# Patient Record
Sex: Female | Born: 1945 | Race: White | Hispanic: No | Marital: Married | Smoking: Never smoker
Health system: Southern US, Community
[De-identification: ages and names within clinical notes are randomized; demographics above are authoritative.]

## PROBLEM LIST (undated history)

## (undated) DIAGNOSIS — N83202 Unspecified ovarian cyst, left side: Principal | ICD-10-CM

## (undated) DIAGNOSIS — E785 Hyperlipidemia, unspecified: Secondary | ICD-10-CM

## (undated) DIAGNOSIS — M797 Fibromyalgia: Secondary | ICD-10-CM

## (undated) DIAGNOSIS — Z9889 Other specified postprocedural states: Secondary | ICD-10-CM

## (undated) DIAGNOSIS — M353 Polymyalgia rheumatica: Secondary | ICD-10-CM

## (undated) DIAGNOSIS — E538 Deficiency of other specified B group vitamins: Secondary | ICD-10-CM

## (undated) DIAGNOSIS — Z972 Presence of dental prosthetic device (complete) (partial): Secondary | ICD-10-CM

## (undated) DIAGNOSIS — N83201 Unspecified ovarian cyst, right side: Principal | ICD-10-CM

## (undated) DIAGNOSIS — K589 Irritable bowel syndrome without diarrhea: Secondary | ICD-10-CM

## (undated) DIAGNOSIS — M19011 Primary osteoarthritis, right shoulder: Secondary | ICD-10-CM

## (undated) DIAGNOSIS — M1711 Unilateral primary osteoarthritis, right knee: Secondary | ICD-10-CM

## (undated) DIAGNOSIS — R112 Nausea with vomiting, unspecified: Secondary | ICD-10-CM

## (undated) DIAGNOSIS — E039 Hypothyroidism, unspecified: Secondary | ICD-10-CM

## (undated) DIAGNOSIS — M810 Age-related osteoporosis without current pathological fracture: Secondary | ICD-10-CM

## (undated) DIAGNOSIS — M7541 Impingement syndrome of right shoulder: Secondary | ICD-10-CM

## (undated) DIAGNOSIS — I10 Essential (primary) hypertension: Secondary | ICD-10-CM

## (undated) DIAGNOSIS — N900 Mild vulvar dysplasia: Secondary | ICD-10-CM

## (undated) DIAGNOSIS — M75101 Unspecified rotator cuff tear or rupture of right shoulder, not specified as traumatic: Secondary | ICD-10-CM

## (undated) HISTORY — DX: Essential (primary) hypertension: I10

## (undated) HISTORY — PX: FOOT SURGERY: SHX648

## (undated) HISTORY — DX: Hyperlipidemia, unspecified: E78.5

## (undated) HISTORY — DX: Irritable bowel syndrome, unspecified: K58.9

## (undated) HISTORY — DX: Unspecified ovarian cyst, right side: N83.201

## (undated) HISTORY — DX: Hypothyroidism, unspecified: E03.9

## (undated) HISTORY — DX: Fibromyalgia: M79.7

## (undated) HISTORY — PX: TOTAL VAGINAL HYSTERECTOMY: SHX2548

## (undated) HISTORY — PX: GANGLION CYST EXCISION: SHX1691

## (undated) HISTORY — PX: EYE SURGERY: SHX253

## (undated) HISTORY — PX: BACK SURGERY: SHX140

## (undated) HISTORY — DX: Age-related osteoporosis without current pathological fracture: M81.0

## (undated) HISTORY — PX: TUBAL LIGATION: SHX77

## (undated) HISTORY — DX: Unspecified ovarian cyst, left side: N83.202

## (undated) HISTORY — PX: OTHER SURGICAL HISTORY: SHX169

## (undated) HISTORY — PX: CATARACT EXTRACTION, BILATERAL: SHX1313

## (undated) HISTORY — DX: Mild vulvar dysplasia: N90.0

---

## 1998-01-31 ENCOUNTER — Ambulatory Visit (HOSPITAL_COMMUNITY): Admission: RE | Admit: 1998-01-31 | Discharge: 1998-01-31 | Payer: Self-pay | Admitting: Family Medicine

## 2000-07-18 ENCOUNTER — Ambulatory Visit (HOSPITAL_COMMUNITY): Admission: RE | Admit: 2000-07-18 | Discharge: 2000-07-18 | Payer: Self-pay

## 2000-08-03 ENCOUNTER — Encounter: Admission: RE | Admit: 2000-08-03 | Discharge: 2000-08-03 | Payer: Self-pay | Admitting: Orthopedic Surgery

## 2000-08-03 ENCOUNTER — Encounter: Payer: Self-pay | Admitting: Orthopedic Surgery

## 2000-11-02 ENCOUNTER — Other Ambulatory Visit: Admission: RE | Admit: 2000-11-02 | Discharge: 2000-11-02 | Payer: Self-pay | Admitting: Obstetrics & Gynecology

## 2001-01-27 ENCOUNTER — Ambulatory Visit (HOSPITAL_COMMUNITY): Admission: RE | Admit: 2001-01-27 | Discharge: 2001-01-27 | Payer: Self-pay

## 2001-09-15 ENCOUNTER — Ambulatory Visit (HOSPITAL_COMMUNITY): Admission: RE | Admit: 2001-09-15 | Discharge: 2001-09-15 | Payer: Self-pay | Admitting: Obstetrics & Gynecology

## 2001-09-15 ENCOUNTER — Encounter: Payer: Self-pay | Admitting: Obstetrics & Gynecology

## 2001-10-06 ENCOUNTER — Encounter: Payer: Self-pay | Admitting: *Deleted

## 2001-10-06 ENCOUNTER — Encounter: Admission: RE | Admit: 2001-10-06 | Discharge: 2001-10-06 | Payer: Self-pay | Admitting: *Deleted

## 2001-11-06 ENCOUNTER — Other Ambulatory Visit: Admission: RE | Admit: 2001-11-06 | Discharge: 2001-11-06 | Payer: Self-pay | Admitting: Obstetrics & Gynecology

## 2002-01-02 ENCOUNTER — Encounter (INDEPENDENT_AMBULATORY_CARE_PROVIDER_SITE_OTHER): Payer: Self-pay | Admitting: *Deleted

## 2002-01-02 ENCOUNTER — Ambulatory Visit (HOSPITAL_COMMUNITY): Admission: RE | Admit: 2002-01-02 | Discharge: 2002-01-02 | Payer: Self-pay | Admitting: *Deleted

## 2002-08-28 ENCOUNTER — Ambulatory Visit (HOSPITAL_COMMUNITY): Admission: RE | Admit: 2002-08-28 | Discharge: 2002-08-28 | Payer: Self-pay | Admitting: Orthopedic Surgery

## 2002-09-27 ENCOUNTER — Encounter: Admission: RE | Admit: 2002-09-27 | Discharge: 2002-09-27 | Payer: Self-pay | Admitting: Family Medicine

## 2002-09-27 ENCOUNTER — Encounter: Payer: Self-pay | Admitting: Family Medicine

## 2003-11-07 ENCOUNTER — Encounter: Admission: RE | Admit: 2003-11-07 | Discharge: 2003-11-07 | Payer: Self-pay | Admitting: Family Medicine

## 2004-12-08 ENCOUNTER — Encounter: Admission: RE | Admit: 2004-12-08 | Discharge: 2004-12-08 | Payer: Self-pay | Admitting: Family Medicine

## 2005-05-25 ENCOUNTER — Ambulatory Visit (HOSPITAL_COMMUNITY): Admission: RE | Admit: 2005-05-25 | Discharge: 2005-05-25 | Payer: Self-pay | Admitting: *Deleted

## 2005-05-25 ENCOUNTER — Encounter (INDEPENDENT_AMBULATORY_CARE_PROVIDER_SITE_OTHER): Payer: Self-pay | Admitting: *Deleted

## 2005-05-25 HISTORY — PX: ESOPHAGOGASTRODUODENOSCOPY ENDOSCOPY: SHX5814

## 2005-05-26 ENCOUNTER — Ambulatory Visit (HOSPITAL_COMMUNITY): Admission: RE | Admit: 2005-05-26 | Discharge: 2005-05-26 | Payer: Self-pay | Admitting: *Deleted

## 2005-08-10 ENCOUNTER — Encounter: Admission: RE | Admit: 2005-08-10 | Discharge: 2005-08-10 | Payer: Self-pay

## 2006-02-16 ENCOUNTER — Encounter: Admission: RE | Admit: 2006-02-16 | Discharge: 2006-02-16 | Payer: Self-pay | Admitting: Family Medicine

## 2006-03-18 LAB — HM DEXA SCAN: HM Dexa Scan: NORMAL

## 2006-07-05 ENCOUNTER — Ambulatory Visit (HOSPITAL_COMMUNITY): Admission: RE | Admit: 2006-07-05 | Discharge: 2006-07-06 | Payer: Self-pay | Admitting: Urology

## 2006-07-05 HISTORY — PX: CYSTOCELE REPAIR: SHX163

## 2006-07-05 HISTORY — PX: CYSTO: SHX6284

## 2006-07-05 HISTORY — PX: INCONTINENCE SURGERY: SHX676

## 2006-11-03 ENCOUNTER — Ambulatory Visit (HOSPITAL_COMMUNITY): Admission: RE | Admit: 2006-11-03 | Discharge: 2006-11-03 | Payer: Self-pay | Admitting: *Deleted

## 2007-03-16 ENCOUNTER — Encounter: Admission: RE | Admit: 2007-03-16 | Discharge: 2007-03-16 | Payer: Self-pay | Admitting: Obstetrics & Gynecology

## 2007-03-24 ENCOUNTER — Ambulatory Visit (HOSPITAL_COMMUNITY): Admission: RE | Admit: 2007-03-24 | Discharge: 2007-03-24 | Payer: Self-pay | Admitting: Anesthesiology

## 2007-11-10 ENCOUNTER — Encounter: Admission: RE | Admit: 2007-11-10 | Discharge: 2007-11-10 | Payer: Self-pay | Admitting: Anesthesiology

## 2007-11-18 ENCOUNTER — Encounter: Admission: RE | Admit: 2007-11-18 | Discharge: 2007-11-18 | Payer: Self-pay | Admitting: Anesthesiology

## 2008-01-26 ENCOUNTER — Ambulatory Visit (HOSPITAL_BASED_OUTPATIENT_CLINIC_OR_DEPARTMENT_OTHER): Admission: RE | Admit: 2008-01-26 | Discharge: 2008-01-26 | Payer: Self-pay | Admitting: Ophthalmology

## 2008-01-26 HISTORY — PX: STRABISMUS SURGERY: SHX218

## 2008-04-23 ENCOUNTER — Encounter: Admission: RE | Admit: 2008-04-23 | Discharge: 2008-04-23 | Payer: Self-pay | Admitting: Obstetrics & Gynecology

## 2008-08-07 ENCOUNTER — Encounter: Admission: RE | Admit: 2008-08-07 | Discharge: 2008-08-07 | Payer: Self-pay | Admitting: Neurosurgery

## 2008-09-11 ENCOUNTER — Emergency Department (HOSPITAL_COMMUNITY): Admission: EM | Admit: 2008-09-11 | Discharge: 2008-09-11 | Payer: Self-pay | Admitting: Emergency Medicine

## 2008-10-08 ENCOUNTER — Inpatient Hospital Stay (HOSPITAL_COMMUNITY): Admission: RE | Admit: 2008-10-08 | Discharge: 2008-10-10 | Payer: Self-pay | Admitting: Neurosurgery

## 2008-10-08 HISTORY — PX: ANTERIOR LAT LUMBAR FUSION: SHX1168

## 2009-01-03 ENCOUNTER — Encounter: Admission: RE | Admit: 2009-01-03 | Discharge: 2009-01-03 | Payer: Self-pay | Admitting: Neurosurgery

## 2009-04-18 ENCOUNTER — Encounter: Admission: RE | Admit: 2009-04-18 | Discharge: 2009-04-18 | Payer: Self-pay

## 2009-05-02 ENCOUNTER — Encounter: Admission: RE | Admit: 2009-05-02 | Discharge: 2009-05-02 | Payer: Self-pay | Admitting: Obstetrics & Gynecology

## 2009-11-11 HISTORY — PX: COLONOSCOPY W/ BIOPSIES: SHX1374

## 2010-02-10 ENCOUNTER — Emergency Department (HOSPITAL_COMMUNITY): Admission: EM | Admit: 2010-02-10 | Discharge: 2010-02-10 | Payer: Self-pay | Admitting: Emergency Medicine

## 2010-02-13 ENCOUNTER — Ambulatory Visit (HOSPITAL_COMMUNITY): Admission: RE | Admit: 2010-02-13 | Discharge: 2010-02-13 | Payer: Self-pay | Admitting: Emergency Medicine

## 2010-04-08 ENCOUNTER — Encounter: Admission: RE | Admit: 2010-04-08 | Discharge: 2010-04-08 | Payer: Self-pay | Admitting: Internal Medicine

## 2010-06-03 ENCOUNTER — Encounter: Admission: RE | Admit: 2010-06-03 | Discharge: 2010-06-03 | Payer: Self-pay | Admitting: Obstetrics & Gynecology

## 2010-10-04 ENCOUNTER — Encounter: Payer: Self-pay | Admitting: *Deleted

## 2010-10-08 ENCOUNTER — Encounter
Admission: RE | Admit: 2010-10-08 | Discharge: 2010-10-08 | Payer: Self-pay | Source: Home / Self Care | Attending: Neurosurgery | Admitting: Neurosurgery

## 2010-12-28 LAB — COMPREHENSIVE METABOLIC PANEL
ALT: 34 U/L (ref 0–35)
AST: 32 U/L (ref 0–37)
Albumin: 4.3 g/dL (ref 3.5–5.2)
Alkaline Phosphatase: 143 U/L — ABNORMAL HIGH (ref 39–117)
BUN: 19 mg/dL (ref 6–23)
CO2: 26 mEq/L (ref 19–32)
Calcium: 10 mg/dL (ref 8.4–10.5)
Chloride: 102 mEq/L (ref 96–112)
Creatinine, Ser: 0.73 mg/dL (ref 0.4–1.2)
GFR calc Af Amer: >60 mL/min (ref >60)
GFR calc non Af Amer: >60 mL/min (ref >60)
Glucose, Bld: 111 mg/dL — ABNORMAL HIGH (ref 70–99)
Potassium: 4.7 mEq/L (ref 3.5–5.1)
Sodium: 139 mEq/L (ref 135–145)
Total Bilirubin: 0.4 mg/dL (ref 0.3–1.2)
Total Protein: 8.3 g/dL (ref 6.0–8.3)

## 2010-12-28 LAB — CBC
HCT: 41.6 % (ref 36.0–46.0)
Hemoglobin: 13.6 g/dL (ref 12.0–15.0)
MCHC: 32.7 g/dL (ref 30.0–36.0)
MCV: 87 fL (ref 78.0–100.0)
Platelets: 351 K/uL (ref 150–400)
RBC: 4.78 MIL/uL (ref 3.87–5.11)
RDW: 14 % (ref 11.5–15.5)
WBC: 9 K/uL (ref 4.0–10.5)

## 2010-12-28 LAB — DIFFERENTIAL
Basophils Absolute: 0 10*3/uL (ref 0.0–0.1)
Basophils Relative: 1 % (ref 0–1)
Eosinophils Absolute: 0.1 10*3/uL (ref 0.0–0.7)
Eosinophils Relative: 1 % (ref 0–5)
Lymphocytes Relative: 35 % (ref 12–46)
Lymphs Abs: 3.2 10*3/uL (ref 0.7–4.0)
Monocytes Absolute: 0.4 10*3/uL (ref 0.1–1.0)
Monocytes Relative: 5 % (ref 3–12)
Neutro Abs: 5.3 10*3/uL (ref 1.7–7.7)
Neutrophils Relative %: 58 % (ref 43–77)

## 2010-12-28 LAB — URINALYSIS, ROUTINE W REFLEX MICROSCOPIC
Bilirubin Urine: NEGATIVE
Glucose, UA: NEGATIVE mg/dL
Hgb urine dipstick: NEGATIVE
Ketones, ur: NEGATIVE mg/dL
Nitrite: NEGATIVE
Protein, ur: NEGATIVE mg/dL
Specific Gravity, Urine: 1.018 (ref 1.005–1.030)
Urobilinogen, UA: 1 mg/dL (ref 0.0–1.0)
pH: 6.5 (ref 5.0–8.0)

## 2010-12-28 LAB — TYPE AND SCREEN
ABO/RH(D): O POS
Antibody Screen: NEGATIVE

## 2010-12-28 LAB — URINE MICROSCOPIC-ADD ON

## 2010-12-28 LAB — PROTIME-INR
INR: 1.1 (ref 0.00–1.49)
Prothrombin Time: 14.1 seconds (ref 11.6–15.2)

## 2010-12-28 LAB — APTT: aPTT: 38 seconds — ABNORMAL HIGH (ref 24–37)

## 2010-12-28 LAB — ABO/RH: ABO/RH(D): O POS

## 2011-01-26 NOTE — Op Note (Signed)
NAMEADAMARIZ, [name]            ACCOUNT NO.:  [id number]   MEDICAL RECORD NO.:  [id number]          PATIENT TYPE:  AMB   LOCATION:  DSC                          FACILITY:  [facility]   PHYSICIAN:  Pasty Spillers. Maple Hudson, M.D. DATE OF BIRTH:  05/25/1946   DATE OF PROCEDURE:  01/26/2008  DATE OF DISCHARGE:                               OPERATIVE REPORT   PREOPERATIVE DIAGNOSIS:  Incomitant right exotropia, history of previous  strabismus surgery x2.   POSTOPERATIVE DIAGNOSIS:  Incomitant right exotropia, history of  previous strabismus surgery x2.   PROCEDURE:  1. Exploration of right medial rectus muscle.  2. Right lateral rectus muscle recession, 8.0 mm, adjustable      technique.  3. Advancement of right medial rectus muscle, 7.0 mm (from 15-8 mm      posterior to the limbus).   SURGEON:  Pasty Spillers. Young, MD   ANESTHESIA:  General (laryngeal mask).   COMPLICATIONS:  None.   DESCRIPTION OF PROCEDURE:  After preoperative evaluation including  informed consent, the patient was taken to the operating room where she  was identified by me.  General anesthesia was induced without difficulty  after placement of appropriate monitors.  The patient was prepped and  draped in standard sterile fashion.  Lid speculum was placed in the  right eye.   Exaggerated forced traction testing showed significant resistance to  forced adduction of the right eye.  A traction suture of 6-0 silk was  placed at the superior and inferior limbus, this was used to draw the  eye temporally.  A limbal conjunctival peritomy of 2 o'clock hours  extent was made nasally in the right eye with Westcott scissors, with  relaxing incisions in the superonasal and inferonasal quadrants.  The  right medial rectus muscle was engaged on a series of muscle hooks and  cleared of its surrounding fascial attachments and scar tissue.  The  muscle was found inserted 15 mm posterior to the limbus.  There was  definite solid muscle  tissue beginning about 3 mm posterior to the  current insertion.  It was not entirely clear whether the tissue  anterior to that was empty capsule or intact muscle/tendon.  A 2-mm bite  was taken of the center of the muscle belly with a double-arm 6-0 Vicryl  suture and a knot was tied securely, approximately 3 mm posterior to the  current insertion, unequivocally in muscle.  The needle at each end of  the double-arm suture was passed from the center of the muscle belly to  the periphery, parallel 2 and 3 mm posterior to the current insertion.  The muscle was disinserted.   Attention was then directed temporally, where a limbal conjunctival  peritomy of 2 o'clock hours extent was made with Westcott scissors, with  relaxing incisions in the superotemporal and inferotemporal quadrants.  The right lateral rectus muscle was engaged on a series of muscle hooks  and carefully cleared of its surrounding fascial attachments and scar  tissue.  It was evident from the conjunctiva that there had been  previous surgery on this muscle, though the current insertion was  at  approximately 8 mm posterior to the limbus.  The tendon was secured with  a double-arm 6-0 Vicryl suture, the double-locking bite at each border  of muscle, 1 mm from the insertion.  The muscle was disinserted, after  which forced adduction became free.  Each pole suture was passed back  into the original muscle stump in crossed swords fashion.  The muscle  was drawn up to the level of the original insertion, and the 2 pole  sutures were tied together 10 cm above the sclera.  The 2 pole sutures  were then joined at a measured distance of 8 mm above sclera with a  needle driver.  A noose suture of 6-0 Vicryl was passed around the 2  sutures at this location, after which the muscle was allowed to hang  back 8 mm from the insertion, as the noose suture reached the sclera.  The superior corner of the conjunctival flap was secured at the  level of  the insertion with a 6-0 plain gut suture.  A loop of 6-0 plain gut was  preplaced joining the inferior corner of the conjunctival flap with the  conjunctiva just inferior to the inferior border of the current  insertion.  This plain gut was tied with a very large loop and left open  to facilitate suture adjustment.   Attention was then redirected nasally, where the medial rectus muscle  was reattached to sclera 8 mm posterior to the limbus (in effect  advancing at 7.5 mm), using direct scleral passes in crossed swords  fashion.  The suture ends were tied superiorly.  Conjunctiva was  reapposed to the limbus with multiple 6-0 plain gut sutures.  The  traction suture was removed from the superior and inferior limbus and  repositioned at the temporal limbus.  The pole, noose, traction, and  plain gut sutures were secured to the cheek with Steri-Strips.  TobraDex  ointment was placed in the eye.  A soft patch was placed over the eye.  The patient was taken to the recovery room in stable condition, having  suffered no intraoperative or immediate postoperative complications.      Pasty Spillers. Maple Hudson, M.D.  Electronically Signed     WOY/MEDQ  D:  01/26/2008  T:  01/27/2008  Job:  [id number]

## 2011-01-26 NOTE — H&P (Signed)
NAMEMARCELLINA, [name]            ACCOUNT NO.:  [id number]   MEDICAL RECORD NO.:  [id number]          PATIENT TYPE:  INP   LOCATION:  3108                         FACILITY:  [facility]   PHYSICIAN:  Payton Doughty, M.D.      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   BODY OF TEXT:  A very nice self-referred 65 year old right-handed white  lady who 11 years ago had 4-5 and 5-1 fusion done by Dr. Senaida Ores at  [facility].  This was done for spondylolysis of grade 5.  She has done well.  She has had some back pain, and she has developed pain into her legs.  It has been worsening.  It is worse when she lies down and it is worse  on the side that she lays on.  CT scan shows spondylosis at C3-4.  Diskography was performed at L3-4 and it was strongly and concordantly  positive, and she is now admitted for a fusion at that level.   MEDICAL HISTORY:  Unremarkable.   Gemfibrozil, Cymbalta, nortriptyline, hydrochlorothiazide, benazepril,  OxyContin, levothyroxine, and hydrocodone.   She is allergic to PENICILLIN.   SURGICAL HISTORY:  Operation to low back and hand.   SOCIAL HISTORY:  She does not smoke and does not drink.  She is retired  on disability.  She has been on disability for about 9 years.   FAMILY HISTORY:  Mother died at 39 of heart disease and blood pressure.  Father died at 47 with an aneurysmal and lung cancer.   REVIEW OF SYSTEMS:  Marked for arthritis, neck pain, difficult memory,  depression, and thyroid disease.   PHYSICAL EXAMINATION:  HEENT:  Normal limits.  NECK:  She has reasonable range of motion of neck.  CHEST:  Clear.  CARDIAC:  Regular rate and rhythm.  ABDOMEN:  Nontender with no hepatosplenomegaly.  EXTREMITIES:  No clubbing or cyanosis.  GENITOURINARY:  Deferred.  Peripheral pulses are good.  NEUROLOGIC:  She is awake, alert, and  oriented.  Cranial nerves are  intact.  Motor exam shows 5/5 strength throughout the upper and lower  extremities.  Sensory, dysesthesias described bilaterally in  nondermatomal fashion.  Reflexes are absent at the knees and ankles.   Imaging studies have been reviewed above, basically has positive  diskography at 3-4.   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   The plan is for anterolateral interbody fusion with XLIF system.  The  risks and benefits have been discussed with her, and she wished to  proceed.            ______________________________  Payton Doughty, M.D.     MWR/MEDQ  D:  10/08/2008  T:  10/09/2008  Job:  [id number]

## 2011-01-26 NOTE — Op Note (Signed)
[name], [name]            ACCOUNT NO.:  [id number]   MEDICAL RECORD NO.:  [id number]          PATIENT TYPE:  INP   LOCATION:  3108                         FACILITY:  [facility]   PHYSICIAN:  Payton Doughty, M.D.      DATE OF BIRTH:  1946/07/23   DATE OF PROCEDURE:  10/08/2008  DATE OF DISCHARGE:                               OPERATIVE REPORT   PREOPERATIVE DIAGNOSIS:  Spondylosis and adjacent segment disease at L3-  4.   POSTOPERATIVE DIAGNOSIS:  Spondylosis and adjacent segment disease at L3-  4.   OPERATIVE PROCEDURE:  L3-4 anterolateral interbody fusion and lateral  plate placement.   SURGEON:  Payton Doughty, MD, Neurosurgery.   ANESTHESIA:  General endotracheal.   PREPARATION:  Prepped and draped with alcohol wipe.   COMPLICATIONS:  None.   NURSE ASSISTANT:  Bedelia Person, MD   DOCTOR ASSISTANT:  Cristi Loron, MD   BODY OF TEXT:  This is a 65 year old girl who has had a fusion at L4-5  and L5-S1 and has developed transitional segment disease at L3-4, taken  to the operating room, smoothly anesthetized and intubated, placed in  the left side up, right side down lateral decubitus position.  The table  was flexed and the area of the left iliac crest exposed.  Following  shave, prep and drape in usual sterile fashion.  Two skin incisions were  made, one directly over the L3-4 interspace just above left iliac crest  and one about 2 fingerbreadths behind that.  Through the posterior  incision, the retroperitoneal space was accessed and dissection down  through the lateral incision allowed the lateral access to the  retroperitoneal space.  Dilators were advanced and docked over the L3-4  interspace.  Using electrical monitoring, the lumbar plexus was  carefully monitored.  Re-docking was required in more anterior position.  Good high values for stimulation were obtained.  Steinmann pin was  placed and retractor system placed over top of it.  Inspection with the  manual  stimulator was also carried out and found to be safe.  The  retractor was expanded and the  placed into the disk space.  The  diskectomy was carried out under fluoroscopic control.  An 18 x 10 x 15-  mm interbody device was placed after being packed with Osteocel.  Radiographic confirmation of good positioning was obtained.  Two screws  were then placed, one above and one below and across there was a plate  placed and molded into place.  Final films  showed good placement of the interbody device as well as lateral plate  and screws.  Incisions were closed with successive layers of 2-0 Vicryl,  3-0 Vicryl, and 3-0 nylon.  Betadine Telfa dressings were applied.  The  patient returned to recovery room in good condition.            ______________________________  Payton Doughty, M.D.     MWR/MEDQ  D:  10/08/2008  T:  10/09/2008  Job:  [id number]

## 2011-01-29 NOTE — Op Note (Signed)
[name], [name]            ACCOUNT NO.:  [id number]   MEDICAL RECORD NO.:  [id number]          PATIENT TYPE:  AMB   LOCATION:  ENDO                         FACILITY:  [facility]   PHYSICIAN:  Georgiana Spinner, M.D.    DATE OF BIRTH:  1946-08-01   DATE OF PROCEDURE:  DATE OF DISCHARGE:                                 OPERATIVE REPORT   Audio too short to transcribe (less than 5 seconds)           ______________________________  Georgiana Spinner, M.D.     GMO/MEDQ  D:  05/25/2005  T:  05/25/2005  Job:  [id number]

## 2011-01-29 NOTE — Procedures (Signed)
[place]. [facility]  Patient:    [name], [name] Visit Number: [id number] MRN: [id number]          Service Type: END Location: ENDO Attending Physician:  Sabino Gasser Dictated by:   Sabino Gasser, M.D. Admit Date:  01/02/2002                             Procedure Report  PROCEDURE:  Upper endoscopy.  INDICATION:  Vomiting.  ANESTHESIA:  Demerol 80 mg, Versed 8 mg.  DESCRIPTION OF PROCEDURE:  With patient mildly sedated in the left lateral decubitus position, the Olympus videoscopic endoscope inserted in the mouth, passed under direct vision through the esophagus, which appeared normal, into the stomach.  Fundus, body, antrum were viewed, and there was some linear erythema extending outward from the pylorus in a radial pattern, which was photographed and biopsied.  We entered into the duodenal bulb, second portion of the duodenum.  Both appeared normal.  From this point the endoscope was slowly withdrawn, taking circumferential views of the duodenal mucosa until the endoscope had been pulled back into the stomach, placed in retroflexion to view the stomach from below.  The endoscope was then straightened and withdrawn, taking circumferential views of the remaining gastric and esophageal mucosa.  The patients vital signs and pulse oximetry remained stable.  The patient tolerated the procedure well without apparent complications.  FINDINGS:  Erythema of the antrum, biopsied.  PLAN:  Await biopsy report.  The patient will call me for results and follow up with me as an outpatient.  Will also schedule gastric emptying study. Proceed to colonoscopy as planned. Dictated by:   Sabino Gasser, M.D. Attending Physician:  Sabino Gasser DD:  01/02/02 TD:  01/02/02 Job: [id number]

## 2011-01-29 NOTE — Procedures (Signed)
[place]. [facility]  Patient:    TRANICE, LADUKE Visit Number: [id number] MRN: [id number]          Service Type: END Location: ENDO Attending Physician:  Sabino Gasser Dictated by:   Sabino Gasser, M.D. Proc. Date: 01/02/02 Admit Date:  01/02/2002                             Procedure Report  PROCEDURE PERFORMED:  Colonoscopy.  ENDOSCOPIST:  Sabino Gasser, M.D.  INDICATIONS FOR PROCEDURE:  Colon cancer screening, hemoccult positivity.  ANESTHESIA:  Demerol 20, Versed 2 mg.  DESCRIPTION OF PROCEDURE:  With the patient mildly sedated in the left lateral decubitus position, the Olympus videoscopic colonoscope was inserted in the rectum and passed under direct vision into the cecum.  The cecum was identified by the ileocecal valve and appendiceal orifice, both of which were photographed.  From this point, the colonoscope was slowly withdrawn, taking circumferential views of the entire colonic mucosa, stopping only in the rectum, which appeared normal on direct and showed hemorrhoids on retroflex view.  The endoscope was straightened and withdrawn.  Patients vital signs and pulse oximeter remained stable.  The patient tolerated the procedure well and without apparent complications.  FINDINGS:  Internal hemorrhoids.  Otherwise unremarkable colonoscopic examination.  PLAN:  See endoscopy note. Dictated by:   Sabino Gasser, M.D. Attending Physician:  Sabino Gasser DD:  01/02/02 TD:  01/02/02 Job: [id number]

## 2011-01-29 NOTE — Op Note (Signed)
[name], [name]            ACCOUNT NO.:  [id number]   MEDICAL RECORD NO.:  [id number]          PATIENT TYPE:  AMB   LOCATION:  ENDO                         FACILITY:  [facility]   PHYSICIAN:  Georgiana Spinner, M.D.    DATE OF BIRTH:  28-Nov-1945   DATE OF PROCEDURE:  05/25/2005  DATE OF DISCHARGE:                                 OPERATIVE REPORT   PROCEDURE:  Upper endoscopy.   ENDOSCOPIST:  Georgiana Spinner, M.D.   INDICATIONS:  Hemoccult positivity.   ANESTHESIA:  Demerol 40, Versed 4 mg.   PROCEDURE:  With the patient mildly sedated in the left lateral decubitus  position, the Olympus videoscopic endoscope was inserted in the mouth and  passed under direct vision through the esophagus, which appeared normal,  into the stomach; fundus, body and antrum were seen.  The whole stomach  appeared to be mildly edematous and mildly erythematous.  Photographs and  biopsies were taken.  Duodenal bulb and second portion of duodenum appeared  normal.  From this point, the endoscope was slowly withdrawn, taking  circumferential views of duodenal mucosa, until the endoscope had been  pulled back into the stomach and placed in retroflexion to view the stomach  from below.  The endoscope was then straightened and withdrawn, taking  circumferential views of the remaining gastric and esophageal mucosa.  The  patient's vital signs and pulse oximetry remained stable.  The patient  tolerated procedure well without apparent complications.   FINDINGS:  Mild erythema and edema of the gastric lumen, await biopsy  report.  The patient will call me for results and follow up with me as an  outpatient.  Proceed to colonoscopy as planned.           ______________________________  Georgiana Spinner, M.D.     GMO/MEDQ  D:  05/25/2005  T:  05/25/2005  Job:  [id number]   cc:   Windle Guard, M.D.  [address]  [place], [place] [address]  Fax: [phone]   Laqueta Linden, M.D.  [address]  [place]  [place] [address]  Fax: [phone]

## 2011-01-29 NOTE — Op Note (Signed)
NAMELUTISHA, KNOCHE            ACCOUNT NO.:  [id number]   MEDICAL RECORD NO.:  [id number]          PATIENT TYPE:  AMB   LOCATION:  ENDO                         FACILITY:  [facility]   PHYSICIAN:  Georgiana Spinner, M.D.    DATE OF BIRTH:  1946-04-25   DATE OF PROCEDURE:  05/25/2005  DATE OF DISCHARGE:                                 OPERATIVE REPORT   PROCEDURE:  Colonoscopy.   INDICATIONS:  Hemoccult positivity.   ANESTHESIA:  Demerol 40 mg, Versed 4 mg.   PROCEDURE:  With the patient mildly sedated in the left lateral decubitus  position, the Olympus videoscopic colonoscope was inserted in the rectum and  passed under direct vision proved to possibly just proximal to the splenic  flexure.  Landmarks were obscured, however, because the prep was poor.  There were areas of solid stool and thick brown liquid stool that could not  be suctioned, and I just elected at that point to terminate the procedure  because I felt that certainly significant lesions could be missed and, too,  she was at increased risk for complications.  Therefore, I elected to stop  at that point and withdraw the colonoscope, taking circumferential views of  colonic mucosa as best I could from that point, stopping to photograph  examples of the poor prep and the rectum, which appeared normal on direct  and retroflexed view.  The endoscope was straightened and withdrawn.  The  patient's vital signs and pulse oximeter remained stable.  The patient  tolerated the procedure well without apparent complication.   FINDINGS:  Poor prep precludes adequate examination.   Will plan on doing barium enema and have the patient follow up with me.  See  endoscopy note for further details as well.           ______________________________  Georgiana Spinner, M.D.     GMO/MEDQ  D:  05/25/2005  T:  05/25/2005  Job:  [id number]   cc:   Windle Guard, M.D.  [address]  [place], [place] [address]  Fax: [phone]   Laqueta Linden, M.D.  [address]  [place]  [place] [address]  Fax: [phone]

## 2011-01-29 NOTE — Op Note (Signed)
[name], [name]            ACCOUNT NO.:  [id number]   MEDICAL RECORD NO.:  [id number]          PATIENT TYPE:  OIB   LOCATION:  1432                         FACILITY:  [facility]   PHYSICIAN:  Martina Sinner, MD DATE OF BIRTH:  June 26, 1946   DATE OF PROCEDURE:  07/05/2006  DATE OF DISCHARGE:                                 OPERATIVE REPORT   PREOPERATIVE DIAGNOSES:  1. Cystocele.  2. Stress incontinence.   POSTOPERATIVE DIAGNOSES:  1. Cystocele.  2. Stress incontinence.   OPERATION PERFORMED:  1. Sling cystourethropexy [facility]).  2. Cystocele repair.  3. Cystoscopy.   SURGEON:  Martina Sinner, M.D.   ASSISTANT:  Terie Purser, M.D.   INDICATIONS FOR THE SURGERY:  Ms. Levenhagen has mixed stress and  incontinence; both are mild.  Her primary symptom is stress incontinence.  She has an symptomatic, moderate-sized cystocele; but, I thought with her  age and activity it would be reasonable to repair at the time of her  surgery.   DESCRIPTION OF THE OPERATION:  Ms. Noack  was prepped and draped in the  usual fashion.  Extra care was taken to minimize her risks of compartment  syndrome, neuropathy and deep venous thrombosis.   On inspection of the vaginal vault she had a grade 2 cystocele with a cuff  well-supported.  The dimples at the vaginal cuff were identified.  I made a  T-shaped anterior vaginal wall incision by dissecting the pubocervical  fascia from the thickened vaginal mucosa bilaterally.  I did not go all the  way back to the white line since I did not feel it was necessary to take  down her lateral attachments.  I also dissected laterally towards  the  urethrovesical angle bilaterally where the sling would be placed.  I made  sure that the mucosa had pubocervical fascia in the area where we were going  to place the sling to reduce the risk of extrusion.  An epinephrine and  lidocaine mixture of approximately 30 mL was used for plane development and  mainly for hemostasis.   I did a two-layer running closure with 2-0 Vicryl on an __________  needle  in a typical anterior repair not encroaching upon the urethrovesical angle.  I actually was happy with the reduction of the cystocele; and, based upon  her anatomy and being asymptomatic I did not think I needed to go back in  the white line and placing a supporting __________ .   I then cystoscoped the patient with efflux of Indigo Carmine from both  ureteral orifices.  Then with the bladder emptied I passed down the two  Stamey needles down on top of and then behind the symphysis pubis staying on  the periosteum delivering them through the urethrovesical angle. Again,  there was efflux of Indigo Carmine from both ureteral orifices.  There was  no trocar within the bladder.  There was no indentation of the bladder with  removal of the trocar.   With the bladder emptied the sling was attached and brought up through the  retropubic space.  We cut below the blue dots  and irrigated the sheath.  I  had tensioned over a Kelly clamp using the wide part of the clamp with my  usual technique.  I wanted to make certain, if anything that the sling was a  little bit on the loose side.  After removal of the sheath I was happy with  the tension of the sling as well as its position in the mid urethra.  I  could descend the sling in the midline as well as lateral to the midline on  the left and right sides.   I trimmed approximately 5-6 mm from each vaginal edge.  The closed the  vaginal mucosa after irrigating with copious irrigation with antibiotics  with running 2-0 Vicryl with a CT-1 needle.  We made certain there was thick  pubocervical fascia over the sling.   The Foley catheter was draining blue urine at the end of the case.  __________  position was good.  On cutting the sling in the suprapubic area  I closed the abdominal incision with 3-0 Vicryl followed by Dermabond.   I was pleased with  her surgery and hopefully it will reach her treatment  goal.           ______________________________  Martina Sinner, MD  Electronically Signed     SAM/MEDQ  D:  07/05/2006  T:  07/05/2006  Job:  [id number]

## 2011-06-09 LAB — POCT HEMOGLOBIN-HEMACUE: Hemoglobin: 12.2

## 2011-06-11 ENCOUNTER — Other Ambulatory Visit: Payer: Self-pay | Admitting: Obstetrics & Gynecology

## 2011-06-11 DIAGNOSIS — Z1231 Encounter for screening mammogram for malignant neoplasm of breast: Secondary | ICD-10-CM

## 2011-06-28 ENCOUNTER — Ambulatory Visit
Admission: RE | Admit: 2011-06-28 | Discharge: 2011-06-28 | Disposition: A | Discharge status: Home / Self Care | Payer: Medicare Other | Source: Ambulatory Visit | Attending: Obstetrics & Gynecology | Admitting: Obstetrics & Gynecology

## 2011-06-28 DIAGNOSIS — Z1231 Encounter for screening mammogram for malignant neoplasm of breast: Secondary | ICD-10-CM

## 2012-02-01 ENCOUNTER — Encounter: Payer: Self-pay | Admitting: Cardiology

## 2012-02-11 ENCOUNTER — Ambulatory Visit (INDEPENDENT_AMBULATORY_CARE_PROVIDER_SITE_OTHER): Payer: Medicare Other | Admitting: Cardiology

## 2012-02-11 ENCOUNTER — Encounter: Payer: Self-pay | Admitting: Cardiology

## 2012-02-11 ENCOUNTER — Encounter: Payer: Medicare Other | Admitting: Nurse Practitioner

## 2012-02-11 VITALS — BP 129/85 | HR 106 | Ht 68.0 in | Wt 200.4 lb

## 2012-02-11 DIAGNOSIS — E785 Hyperlipidemia, unspecified: Secondary | ICD-10-CM

## 2012-02-11 DIAGNOSIS — E7800 Pure hypercholesterolemia, unspecified: Secondary | ICD-10-CM

## 2012-02-11 DIAGNOSIS — R079 Chest pain, unspecified: Secondary | ICD-10-CM

## 2012-02-11 DIAGNOSIS — I10 Essential (primary) hypertension: Secondary | ICD-10-CM | POA: Insufficient documentation

## 2012-02-11 DIAGNOSIS — R072 Precordial pain: Secondary | ICD-10-CM | POA: Insufficient documentation

## 2012-02-11 NOTE — Assessment & Plan Note (Signed)
The blood pressure is at target. No change in medications is indicated. We will continue with therapeutic lifestyle changes (TLC).  

## 2012-02-11 NOTE — Patient Instructions (Signed)
The current medical regimen is effective;  continue present plan and medications.  Your physician has requested that you have an exercise tolerance test. For further information please visit [email or web address] Please also follow instruction sheet, as given.  Please have fasting blood work the same day as your treadmill.

## 2012-02-11 NOTE — Assessment & Plan Note (Signed)
She does have risk factors. However, I think her symptoms are somewhat atypical. I will bring the patient back for a POET (Plain Old Exercise Test). This will allow me to screen for obstructive coronary disease, risk stratify and very importantly provide a prescription for exercise.

## 2012-02-11 NOTE — Procedures (Signed)
Exercise Treadmill Test  Pre-Exercise Testing Evaluation Rhythm: normal sinus  Rate: 90   PR:  .14 QRS:  .10  QT:  .37 QTc: .45     Test  Exercise Tolerance Test Ordering MD: Angelina Sheriff, MD  Interpreting MD: Angelina Sheriff, MD  Unique Test No: 1  Treadmill:  1  Indication for ETT: chest pain - rule out ischemia  Contraindication to ETT: No   Stress Modality: exercise - treadmill  Cardiac Imaging Performed: non   Protocol: standard Bruce - maximal  Max BP:  175/74  Max MPHR (bpm):  156 85% MPR (bpm):  132  MPHR obtained (bpm):  140 % MPHR obtained:  90  Reached 85% MPHR (min:sec):  2:40 Total Exercise Time (min-sec):  4:00  Workload in METS:  5.8 Borg Scale: 17  Reason ETT Terminated:  desired heart rate attained    ST Segment Analysis At Rest: normal ST segments - no evidence of significant ST depression With Exercise: no evidence of significant ST depression  Other Information Arrhythmia:  No Angina during ETT:  present (1) Quality of ETT:  non-diagnostic  ETT Interpretation:  borderline (indeterminate) with non-specific ST changes  Comments: The patient had an poor exercise tolerance.  There was chest pain at only three minutes.  There was an increased level of dyspnea.  There were no arrhythmias and a normal BP response.  There were no ischemic ST T wave changes but an abnormal heart rate recovery.   Recommendations: Based on the above I could not exclude obstructive CAD.  She will need Lexiscan Myoview.

## 2012-02-11 NOTE — Progress Notes (Signed)
HPI The patient presents for evaluation of chest discomfort. She has a history of hyperlipidemia and hypertension but no prior cardiac history. She reports that she's been getting chest discomfort for some weeks. She had a severe episode about 3 weeks ago. This happened while driving. His meds at home. They lasted for about 3 minutes. She may have had some radiation down her left arm. She didn't have any radiation to her neck. She said it was somewhat sharp.  It came and went spontaneously. She has had less severe episodes since then. She is very active and does exercises routinely.  With her exercises she cannot bring on the symptoms. She does not report shortness of breath, PND or orthopnea. He has no palpitations, presyncope or syncope. Of note she reports a stress test many years ago.  Allergies  Allergen Reactions  . Penicillins   . Sulfa Drugs Cross Reactors     swelling    Current Outpatient Prescriptions  Medication Sig Dispense Refill  . benazepril (LOTENSIN) 20 MG tablet Take 20 mg by mouth daily.      . diazepam (VALIUM) 5 MG tablet Take 5 mg by mouth every 6 (six) hours as needed.      . DULoxetine (CYMBALTA) 60 MG capsule Take 60 mg by mouth daily.      Marland Kitchen gemfibrozil (LOPID) 600 MG tablet Take 600 mg by mouth daily.      . hydrochlorothiazide (HYDRODIURIL) 50 MG tablet Take 50 mg by mouth daily. 1/2 tab daily       . levothyroxine (SYNTHROID, LEVOTHROID) 100 MCG tablet Take 100 mcg by mouth daily.      . nortriptyline (PAMELOR) 50 MG capsule Take 50 mg by mouth at bedtime.      Marland Kitchen oxymorphone (OPANA ER) 20 MG 12 hr tablet Take 20 mg by mouth every 12 (twelve) hours.        Past Medical History  Diagnosis Date  . Chest skin lesion   . Muscle spasm   . UTI (lower urinary tract infection)   . HTN (hypertension)   . Hyperlipidemia   . Hypothyroid     Past Surgical History  Procedure Date  . Vaginal hysterectomy   . Ganglion cyst excision   . Eye surgery     "Lazy  eye"  . Thumb surgery     Bilateral  . Lumbar back     x 2    Family History  Problem Relation Age of Onset  . Aortic aneurysm Father   . Coronary artery disease Mother 28    History   Social History  . Marital Status: Married    Spouse Name: N/A    Number of Children: 3  . Years of Education: N/A   Occupational History  .     Social History Main Topics  . Smoking status: Never Smoker   . Smokeless tobacco: Not on file  . Alcohol Use: Not on file  . Drug Use: Not on file  . Sexually Active: Not on file   Other Topics Concern  . Not on file   Social History Narrative   Lives at home with wife and granddaughter.      ROS:  As stated in the HPI and negative for all other systems.   PHYSICAL EXAM BP 129/85  Pulse 106  Ht 5\' 8"  (1.727 m)  Wt 200 lb 6.4 oz (90.901 kg)  BMI 30.47 kg/m2 GENERAL:  Well appearing HEENT:  Pupils equal round and reactive,  fundi not visualized, oral mucosa unremarkable NECK:  No jugular venous distention, waveform within normal limits, carotid upstroke brisk and symmetric, no bruits, no thyromegaly LYMPHATICS:  No cervical, inguinal adenopathy LUNGS:  Clear to auscultation bilaterally BACK:  No CVA tenderness CHEST:  Unremarkable HEART:  PMI not displaced or sustained,S1 and S2 within normal limits, no S3, no S4, no clicks, no rubs, no murmurs ABD:  Flat, positive bowel sounds normal in frequency in pitch, no bruits, no rebound, no guarding, no midline pulsatile mass, no hepatomegaly, no splenomegaly EXT:  2 plus pulses throughout, no edema, no cyanosis no clubbing SKIN:  No rashes no nodules NEURO:  Cranial nerves II through XII grossly intact, motor grossly intact throughout PSYCH:  Cognitively intact, oriented to person place and time   EKG:  Sinus rhythm, rate 106, axis within normal limits, intervals within normal limits, no acute ST-T wave changes.  ASSESSMENT AND PLAN

## 2012-02-11 NOTE — Assessment & Plan Note (Addendum)
I will repeat a lipid profile when she returns for her stress test.

## 2012-02-15 ENCOUNTER — Other Ambulatory Visit: Payer: Medicare Other

## 2012-02-16 ENCOUNTER — Ambulatory Visit (HOSPITAL_COMMUNITY): Payer: Medicare Other | Attending: Cardiovascular Disease | Admitting: Radiology

## 2012-02-16 ENCOUNTER — Other Ambulatory Visit (INDEPENDENT_AMBULATORY_CARE_PROVIDER_SITE_OTHER): Payer: Medicare Other

## 2012-02-16 VITALS — BP 110/86 | Ht 68.0 in | Wt 197.0 lb

## 2012-02-16 DIAGNOSIS — R079 Chest pain, unspecified: Secondary | ICD-10-CM

## 2012-02-16 DIAGNOSIS — Z8249 Family history of ischemic heart disease and other diseases of the circulatory system: Secondary | ICD-10-CM | POA: Insufficient documentation

## 2012-02-16 DIAGNOSIS — I10 Essential (primary) hypertension: Secondary | ICD-10-CM | POA: Insufficient documentation

## 2012-02-16 DIAGNOSIS — E7800 Pure hypercholesterolemia, unspecified: Secondary | ICD-10-CM

## 2012-02-16 DIAGNOSIS — R0602 Shortness of breath: Secondary | ICD-10-CM

## 2012-02-16 DIAGNOSIS — E785 Hyperlipidemia, unspecified: Secondary | ICD-10-CM | POA: Insufficient documentation

## 2012-02-16 DIAGNOSIS — R61 Generalized hyperhidrosis: Secondary | ICD-10-CM | POA: Insufficient documentation

## 2012-02-16 LAB — LIPID PANEL
Cholesterol: 170 mg/dL (ref 0–200)
HDL: 57.3 mg/dL (ref >39.00)
LDL Cholesterol: 98 mg/dL (ref 0–99)
Total CHOL/HDL Ratio: 3
Triglycerides: 72 mg/dL (ref 0.0–149.0)
VLDL: 14.4 mg/dL (ref 0.0–40.0)

## 2012-02-16 MED ORDER — TECHNETIUM TC 99M TETROFOSMIN IV KIT
33.0000 | PACK | Freq: Once | INTRAVENOUS | Status: AC | PRN
  Administered 2012-02-16: 33 via INTRAVENOUS

## 2012-02-16 MED ORDER — TECHNETIUM TC 99M TETROFOSMIN IV KIT
11.0000 | PACK | Freq: Once | INTRAVENOUS | Status: AC | PRN
  Administered 2012-02-16: 11 via INTRAVENOUS

## 2012-02-16 MED ORDER — REGADENOSON 0.4 MG/5ML IV SOLN
0.4000 mg | Freq: Once | INTRAVENOUS | Status: AC
  Administered 2012-02-16: 0.4 mg via INTRAVENOUS

## 2012-02-16 NOTE — Progress Notes (Signed)
[facility] SITE 3 NUCLEAR MED [address] [place] [address] [phone]  Cardiology Nuclear Med Study  [name] is a 66 y.o. female     MRN : [id number]     DOB: [birth year]  Procedure Date: 02/16/2012  Nuclear Med Background Indication for Stress Test:  Evaluation for Ischemia, and 02/11/12 abnormal GXT History:  02/11/12 GXT: Non Specific ST T changes Non DX poor exercise tolerence with cp/doe abn HR in recovery Cardiac Risk Factors: Family History - CAD, Hypertension and Lipids  Symptoms:  Chest Pain and Diaphoresis   Nuclear Pre-Procedure Caffeine/Decaff Intake:  None> 12 hrs NPO After: 8:00pm   Lungs:  clear O2 Sat: 98% on room air. IV 0.9% NS with Angio Cath:  22g  IV Site: R Antecubital x 1, tolerated well IV Started by:  Irean Hong, RN  Chest Size (in):  42 Cup Size: C  Height: 5\' 8"  (1.727 m)  Weight:  197 lb (89.359 kg)  BMI:  Body mass index is 29.95 kg/(m^2). Tech Comments:  n/a    Nuclear Med Study 1 or 2 day study: 1 day  Stress Test Type:  Treadmill/Lexiscan  Reading MD: Kristeen Miss, MD  Order Authorizing Provider:  Rollene Rotunda, MD  Resting Radionuclide: Technetium 71m Tetrofosmin  Resting Radionuclide Dose: 11.0 mCi   Stress Radionuclide:  Technetium 10m Tetrofosmin  Stress Radionuclide Dose: 33.0 mCi           Stress Protocol Rest HR: 71 Stress HR: 100  Rest BP: 110/86 Stress BP: 120/71  Exercise Time (min): n/a METS: n/a   Predicted Max HR: 156 bpm % Max HR: 64.1 bpm Rate Pressure Product: 60109   Dose of Adenosine (mg):  n/a Dose of Lexiscan: 0.4 mg  Dose of Atropine (mg): n/a Dose of Dobutamine: n/a mcg/kg/min (at max HR)  Stress Test Technologist: Milana Na, EMT-P  Nuclear Technologist:  Domenic Polite, CNMT     Rest Procedure:  Myocardial perfusion imaging was performed at rest 45 minutes following the intravenous administration of Technetium 12m Tetrofosmin. Rest ECG: NSR - Normal EKG  Stress  Procedure:  The patient received IV Lexiscan 0.4 mg over 15-seconds with concurrent low level exercise and then Technetium 71m Tetrofosmin was injected at 30-seconds while the patient continued walking one more minute. There were no significant changes, + sob, chest heaviness, and headache with Lexiscan. Quantitative spect images were obtained after a 45-minute delay. Stress ECG: No significant change from baseline ECG  QPS Raw Data Images:  Normal; no motion artifact; normal heart/lung ratio. Stress Images:  Normal homogeneous uptake in all areas of the myocardium. Rest Images:  Normal homogeneous uptake in all areas of the myocardium. Subtraction (SDS):  No evidence of ischemia. Transient Ischemic Dilatation (Normal <1.22):  0.97 Lung/Heart Ratio (Normal <0.45):  0.30  Quantitative Gated Spect Images QGS EDV:  80 ml QGS ESV:  24 ml  Impression Exercise Capacity:  Lexiscan with no exercise. BP Response:  Normal blood pressure response. Clinical Symptoms:  No significant symptoms noted. ECG Impression:  No significant ST segment change suggestive of ischemia. Comparison with Prior Nuclear Study: No images to compare  Overall Impression:  Normal stress nuclear study.  No evidence of ischemia.  Normal LV function.  LV Ejection Fraction: 70%.  LV Wall Motion:  NL LV Function; NL Wall Motion.    [name], [name], MD, [facility] 02/16/2012, 4:57 PM Office - [phone] Pager [phone]

## 2012-02-18 ENCOUNTER — Encounter: Payer: Self-pay | Admitting: *Deleted

## 2012-02-22 ENCOUNTER — Telehealth: Payer: Self-pay | Admitting: *Deleted

## 2012-02-22 NOTE — Telephone Encounter (Signed)
Pt aware to follow up as needed 

## 2012-02-22 NOTE — Telephone Encounter (Signed)
Message copied by Sharin Grave on Tue Feb 22, 2012  4:37 PM ------      Message from: Rollene Rotunda      Created: Sun Feb 20, 2012 10:03 AM      Regarding: RE: ? follow up appt       She can come back to see me if she has any further questions.  However, I would suggest follow up with her primary care since I don't suspect a cardiac etiology to her complaints.       ----- Message -----         From: Rocco Serene, RN         Sent: 02/18/2012   4:04 PM           To: Rollene Rotunda, MD      Subject: ? follow up appt                                         Pt wants to know when she should follow up

## 2012-03-01 ENCOUNTER — Encounter: Payer: Self-pay | Admitting: Cardiology

## 2012-03-03 ENCOUNTER — Institutional Professional Consult (permissible substitution): Payer: Medicare Other | Admitting: Cardiology

## 2012-07-13 ENCOUNTER — Other Ambulatory Visit: Payer: Self-pay | Admitting: Obstetrics & Gynecology

## 2012-07-13 DIAGNOSIS — Z1231 Encounter for screening mammogram for malignant neoplasm of breast: Secondary | ICD-10-CM

## 2012-08-23 ENCOUNTER — Ambulatory Visit
Admission: RE | Admit: 2012-08-23 | Discharge: 2012-08-23 | Disposition: A | Discharge status: Home / Self Care | Payer: Medicare Other | Source: Ambulatory Visit | Attending: Obstetrics & Gynecology | Admitting: Obstetrics & Gynecology

## 2012-08-23 DIAGNOSIS — Z1231 Encounter for screening mammogram for malignant neoplasm of breast: Secondary | ICD-10-CM

## 2012-09-13 HISTORY — PX: ORIF FOREARM FRACTURE: SHX2124

## 2012-11-11 HISTORY — PX: COLONOSCOPY W/ BIOPSIES: SHX1374

## 2013-01-25 ENCOUNTER — Other Ambulatory Visit: Payer: Self-pay | Admitting: Gastroenterology

## 2013-01-25 LAB — HM COLONOSCOPY

## 2013-07-14 DIAGNOSIS — M75101 Unspecified rotator cuff tear or rupture of right shoulder, not specified as traumatic: Secondary | ICD-10-CM

## 2013-07-14 DIAGNOSIS — M19011 Primary osteoarthritis, right shoulder: Secondary | ICD-10-CM

## 2013-07-14 DIAGNOSIS — M7541 Impingement syndrome of right shoulder: Secondary | ICD-10-CM

## 2013-07-14 HISTORY — DX: Primary osteoarthritis, right shoulder: M19.011

## 2013-07-14 HISTORY — DX: Impingement syndrome of right shoulder: M75.41

## 2013-07-14 HISTORY — DX: Unspecified rotator cuff tear or rupture of right shoulder, not specified as traumatic: M75.101

## 2013-07-30 ENCOUNTER — Other Ambulatory Visit: Payer: Self-pay | Admitting: Orthopedic Surgery

## 2013-07-30 ENCOUNTER — Encounter (HOSPITAL_BASED_OUTPATIENT_CLINIC_OR_DEPARTMENT_OTHER): Payer: Self-pay | Admitting: *Deleted

## 2013-07-30 NOTE — Pre-Procedure Instructions (Signed)
To come for BMET and EKG 

## 2013-08-01 ENCOUNTER — Other Ambulatory Visit: Payer: Self-pay

## 2013-08-01 ENCOUNTER — Encounter (HOSPITAL_BASED_OUTPATIENT_CLINIC_OR_DEPARTMENT_OTHER)
Admission: RE | Admit: 2013-08-01 | Discharge: 2013-08-01 | Disposition: A | Discharge status: Home / Self Care | Payer: Medicare Other | Source: Ambulatory Visit | Attending: Orthopedic Surgery | Admitting: Orthopedic Surgery

## 2013-08-01 LAB — BASIC METABOLIC PANEL
BUN: 17 mg/dL (ref 6–23)
CO2: 25 mEq/L (ref 19–32)
Calcium: 9.6 mg/dL (ref 8.4–10.5)
Chloride: 104 mEq/L (ref 96–112)
Creatinine, Ser: 0.66 mg/dL (ref 0.50–1.10)
GFR calc Af Amer: >90 mL/min (ref >90)
GFR calc non Af Amer: >90 mL/min (ref >90)
Glucose, Bld: 121 mg/dL — ABNORMAL HIGH (ref 70–99)
Potassium: 4.1 mEq/L (ref 3.5–5.1)
Sodium: 142 mEq/L (ref 135–145)

## 2013-08-01 NOTE — H&P (Signed)
  [facility] [address]   [address] [place], [place] [address] [phone] A Division of [facility]  [name], M.D.   Robert A. Thurston Hole, M.D.   Burnell Blanks, M.D.   Eulas Post, M.D.   Lunette Stands, M.D Jewel Baize. Eulah Pont, M.D.  Buford Dresser, M.D.  Estell Harpin, M.D.    Melina Fiddler, M.D. Danford Bad. Willa Rough, PA-C  Kirstin A. Shepperson, PA-C  Josh Louisville, PA-C [place], [place]   RE: Isabel, Morgan   [id number]      DOB: 03-26-1946 PROGRESS NOTE: 07-20-13 [name] comes in with right shoulder pain that started 10 months ago. She states she was reaching behind her back and felt a pop. Pain has persisted since then. She has seen her primary care physician Dr. Jeannetta Nap and had 2 cortisone injections and 1 round of Prednisone without relief. She rates pain at 9/10 described as dull. She states the  pain wakes her at night and she is unable to completely move the arm. She has significant weakness with the right shoulder. She denies any numbness or tingling. No other complaints.  Past medical, family, social history is detailed on the chart. Review of systems detailed in HPI all others are reviewed and are negative.   EXAMINATION: Well-developed well-nourished female in no acute distress. Alert and oriented x3. Exam of her right shoulder shows some rotator cuff atrophy. She has full passive range of motion. With active range of motion she has abduction to approximately 20 degrees forward flexion to 20 degrees. Positive empty can test positive drop arm test. She is neurovascularly intact distally.  IMPRESSION: Right shoulder rotator cuff tear.   PLAN: Patient was educated on their diagnosis and typical treatment algorithm.  Based on exam we are very concerned for a near complete rotator cuff tear. We will order an MRI to assess the degree of rotator cuff pathology. Given the likelihood we will find  a rotator cuff tear surgical options are discussed today. Discussed risks benefits and possible complications and she would like to schedule surgery. We will call her with the MRI results and continued surgical planning based on MRI findings. Instructed patient to call with questions and/or concerns. Questions were encouraged and answered. She will follow-up one week post-op.  [name], M.D.  Electronically verified by [name], M.D. DFM([facility]):kah D 07-20-13 T 07-20-13

## 2013-08-02 ENCOUNTER — Encounter (HOSPITAL_BASED_OUTPATIENT_CLINIC_OR_DEPARTMENT_OTHER): Payer: Self-pay | Admitting: Anesthesiology

## 2013-08-02 ENCOUNTER — Encounter (HOSPITAL_BASED_OUTPATIENT_CLINIC_OR_DEPARTMENT_OTHER)
Admission: RE | Disposition: A | Discharge status: Home / Self Care | Payer: Self-pay | Source: Ambulatory Visit | Attending: Orthopedic Surgery

## 2013-08-02 ENCOUNTER — Encounter (HOSPITAL_BASED_OUTPATIENT_CLINIC_OR_DEPARTMENT_OTHER): Payer: Medicare Other | Admitting: Anesthesiology

## 2013-08-02 ENCOUNTER — Ambulatory Visit (HOSPITAL_BASED_OUTPATIENT_CLINIC_OR_DEPARTMENT_OTHER)
Admission: RE | Admit: 2013-08-02 | Discharge: 2013-08-02 | Disposition: A | Discharge status: Home / Self Care | Payer: Medicare Other | Source: Ambulatory Visit | Attending: Orthopedic Surgery | Admitting: Orthopedic Surgery

## 2013-08-02 ENCOUNTER — Ambulatory Visit (HOSPITAL_BASED_OUTPATIENT_CLINIC_OR_DEPARTMENT_OTHER): Payer: Medicare Other | Admitting: Anesthesiology

## 2013-08-02 DIAGNOSIS — Z01812 Encounter for preprocedural laboratory examination: Secondary | ICD-10-CM | POA: Insufficient documentation

## 2013-08-02 DIAGNOSIS — M719 Bursopathy, unspecified: Secondary | ICD-10-CM | POA: Insufficient documentation

## 2013-08-02 DIAGNOSIS — M19019 Primary osteoarthritis, unspecified shoulder: Secondary | ICD-10-CM | POA: Insufficient documentation

## 2013-08-02 DIAGNOSIS — M25819 Other specified joint disorders, unspecified shoulder: Secondary | ICD-10-CM | POA: Insufficient documentation

## 2013-08-02 DIAGNOSIS — M67919 Unspecified disorder of synovium and tendon, unspecified shoulder: Secondary | ICD-10-CM | POA: Insufficient documentation

## 2013-08-02 DIAGNOSIS — M19011 Primary osteoarthritis, right shoulder: Secondary | ICD-10-CM

## 2013-08-02 HISTORY — DX: Deficiency of other specified B group vitamins: E53.8

## 2013-08-02 HISTORY — DX: Other specified postprocedural states: Z98.890

## 2013-08-02 HISTORY — DX: Presence of dental prosthetic device (complete) (partial): Z97.2

## 2013-08-02 HISTORY — DX: Primary osteoarthritis, right shoulder: M19.011

## 2013-08-02 HISTORY — DX: Unspecified rotator cuff tear or rupture of right shoulder, not specified as traumatic: M75.101

## 2013-08-02 HISTORY — PX: SHOULDER ARTHROSCOPY WITH ROTATOR CUFF REPAIR AND SUBACROMIAL DECOMPRESSION: SHX5686

## 2013-08-02 HISTORY — DX: Impingement syndrome of right shoulder: M75.41

## 2013-08-02 HISTORY — DX: Other specified postprocedural states: R11.2

## 2013-08-02 LAB — POCT HEMOGLOBIN-HEMACUE: Hemoglobin: 13.5 g/dL (ref 12.0–15.0)

## 2013-08-02 SURGERY — SHOULDER ARTHROSCOPY WITH ROTATOR CUFF REPAIR AND SUBACROMIAL DECOMPRESSION
Anesthesia: Regional | Site: Shoulder | Laterality: Right | Wound class: Clean

## 2013-08-02 MED ORDER — SODIUM CHLORIDE 0.9 % IR SOLN
Status: DC | PRN
  Administered 2013-08-02: 13000 mL

## 2013-08-02 MED ORDER — MIDAZOLAM HCL 2 MG/ML PO SYRP: 12.0000 mg | ORAL_SOLUTION | Freq: Once | ORAL | Status: DC | PRN

## 2013-08-02 MED ORDER — OXYCODONE-ACETAMINOPHEN 7.5-325 MG PO TABS: 1.0000 | ORAL_TABLET | ORAL | Status: DC | PRN

## 2013-08-02 MED ORDER — DEXAMETHASONE SODIUM PHOSPHATE 4 MG/ML IJ SOLN
INTRAMUSCULAR | Status: DC | PRN
  Administered 2013-08-02: 10 mg via INTRAVENOUS

## 2013-08-02 MED ORDER — FENTANYL CITRATE 0.05 MG/ML IJ SOLN
INTRAMUSCULAR | Status: AC
  Filled 2013-08-02: qty 2

## 2013-08-02 MED ORDER — ONDANSETRON HCL 4 MG/2ML IJ SOLN
INTRAMUSCULAR | Status: DC | PRN
  Administered 2013-08-02: 4 mg via INTRAVENOUS

## 2013-08-02 MED ORDER — CHLORHEXIDINE GLUCONATE 4 % EX LIQD: 60.0000 mL | Freq: Once | CUTANEOUS | Status: DC

## 2013-08-02 MED ORDER — MIDAZOLAM HCL 2 MG/2ML IJ SOLN
INTRAMUSCULAR | Status: AC
  Filled 2013-08-02: qty 2

## 2013-08-02 MED ORDER — FENTANYL CITRATE 0.05 MG/ML IJ SOLN
50.0000 ug | INTRAMUSCULAR | Status: DC | PRN
  Administered 2013-08-02: 50 ug via INTRAVENOUS

## 2013-08-02 MED ORDER — LIDOCAINE HCL (CARDIAC) 20 MG/ML IV SOLN
INTRAVENOUS | Status: DC | PRN
  Administered 2013-08-02: 50 mg via INTRAVENOUS

## 2013-08-02 MED ORDER — LACTATED RINGERS IV SOLN
INTRAVENOUS | Status: DC
  Administered 2013-08-02: 08:00:00 via INTRAVENOUS

## 2013-08-02 MED ORDER — PROPOFOL 10 MG/ML IV BOLUS
INTRAVENOUS | Status: DC | PRN
  Administered 2013-08-02: 150 mg via INTRAVENOUS

## 2013-08-02 MED ORDER — MIDAZOLAM HCL 2 MG/2ML IJ SOLN
1.0000 mg | INTRAMUSCULAR | Status: DC | PRN
  Administered 2013-08-02: 1 mg via INTRAVENOUS

## 2013-08-02 MED ORDER — SODIUM CHLORIDE 0.9 % IV SOLN: INTRAVENOUS | Status: DC

## 2013-08-02 MED ORDER — OXYCODONE HCL 5 MG/5ML PO SOLN: 5.0000 mg | Freq: Once | ORAL | Status: DC | PRN

## 2013-08-02 MED ORDER — CLINDAMYCIN PHOSPHATE 900 MG/50ML IV SOLN
INTRAVENOUS | Status: AC
  Filled 2013-08-02: qty 50

## 2013-08-02 MED ORDER — EPHEDRINE SULFATE 50 MG/ML IJ SOLN
INTRAMUSCULAR | Status: DC | PRN
  Administered 2013-08-02 (×4): 10 mg via INTRAVENOUS

## 2013-08-02 MED ORDER — SUCCINYLCHOLINE CHLORIDE 20 MG/ML IJ SOLN
INTRAMUSCULAR | Status: DC | PRN
  Administered 2013-08-02: 100 mg via INTRAVENOUS

## 2013-08-02 MED ORDER — HYDROMORPHONE HCL PF 1 MG/ML IJ SOLN: 0.2500 mg | INTRAMUSCULAR | Status: DC | PRN

## 2013-08-02 MED ORDER — BUPIVACAINE-EPINEPHRINE PF 0.5-1:200000 % IJ SOLN
INTRAMUSCULAR | Status: DC | PRN
  Administered 2013-08-02: 30 mL

## 2013-08-02 MED ORDER — OXYCODONE HCL 5 MG PO TABS: 5.0000 mg | ORAL_TABLET | Freq: Once | ORAL | Status: DC | PRN

## 2013-08-02 MED ORDER — CLINDAMYCIN PHOSPHATE 900 MG/50ML IV SOLN
900.0000 mg | INTRAVENOUS | Status: AC
  Administered 2013-08-02: 900 mg via INTRAVENOUS

## 2013-08-02 MED ORDER — ONDANSETRON HCL 4 MG/2ML IJ SOLN: 4.0000 mg | Freq: Four times a day (QID) | INTRAMUSCULAR | Status: DC | PRN

## 2013-08-02 SURGICAL SUPPLY — 74 items
ANCHOR PEEK SWIVEL LOCK 5.5 (Anchor) ×4 IMPLANT
BENZOIN TINCTURE PRP APPL 2/3 (GAUZE/BANDAGES/DRESSINGS) IMPLANT
BLADE CUTTER GATOR 3.5 (BLADE) ×2 IMPLANT
BLADE CUTTER MENIS 5.5 (BLADE) IMPLANT
BLADE GREAT WHITE 4.2 (BLADE) ×2 IMPLANT
BLADE SURG 15 STRL LF DISP TIS (BLADE) IMPLANT
BLADE SURG 15 STRL SS (BLADE)
BUR OVAL 6.0 (BURR) ×2 IMPLANT
CANISTER SUCT 3000ML (MISCELLANEOUS) IMPLANT
CANISTER SUCT LVC 12 LTR MEDI- (MISCELLANEOUS) IMPLANT
CANNULA DRY DOC 8X75 (CANNULA) ×2 IMPLANT
CANNULA TWIST IN 8.25X7CM (CANNULA) IMPLANT
DECANTER SPIKE VIAL GLASS SM (MISCELLANEOUS) IMPLANT
DRAPE STERI 35X30 U-POUCH (DRAPES) ×2 IMPLANT
DRAPE U-SHAPE 47X51 STRL (DRAPES) ×2 IMPLANT
DRAPE U-SHAPE 76X120 STRL (DRAPES) ×4 IMPLANT
DRSG PAD ABDOMINAL 8X10 ST (GAUZE/BANDAGES/DRESSINGS) ×2 IMPLANT
DURAPREP 26ML APPLICATOR (WOUND CARE) ×2 IMPLANT
ELECT MENISCUS 165MM 90D (ELECTRODE) ×2 IMPLANT
ELECT NEEDLE TIP 2.8 STRL (NEEDLE) IMPLANT
ELECT REM PT RETURN 9FT ADLT (ELECTROSURGICAL) ×2
ELECTRODE REM PT RTRN 9FT ADLT (ELECTROSURGICAL) ×1 IMPLANT
GAUZE XEROFORM 1X8 LF (GAUZE/BANDAGES/DRESSINGS) ×2 IMPLANT
GLOVE BIO SURGEON STRL SZ 6.5 (GLOVE) ×2 IMPLANT
GLOVE BIO SURGEON STRL SZ7 (GLOVE) ×4 IMPLANT
GLOVE BIO SURGEON STRL SZ8 (GLOVE) IMPLANT
GLOVE BIOGEL PI IND STRL 7.0 (GLOVE) ×1 IMPLANT
GLOVE BIOGEL PI IND STRL 7.5 (GLOVE) ×2 IMPLANT
GLOVE BIOGEL PI IND STRL 8.5 (GLOVE) IMPLANT
GLOVE BIOGEL PI INDICATOR 7.0 (GLOVE) ×1
GLOVE BIOGEL PI INDICATOR 7.5 (GLOVE) ×2
GLOVE BIOGEL PI INDICATOR 8.5 (GLOVE)
GLOVE EXAM NITRILE MD LF STRL (GLOVE) ×2 IMPLANT
GLOVE ORTHO TXT STRL SZ7.5 (GLOVE) ×2 IMPLANT
GOWN PREVENTION PLUS XLARGE (GOWN DISPOSABLE) ×4 IMPLANT
GOWN PREVENTION PLUS XXLARGE (GOWN DISPOSABLE) ×4 IMPLANT
GOWN STRL REIN 2XL XLG LVL4 (GOWN DISPOSABLE) IMPLANT
IV NS IRRIG 3000ML ARTHROMATIC (IV SOLUTION) ×8 IMPLANT
NDL SUT 6 .5 CRC .975X.05 MAYO (NEEDLE) IMPLANT
NEEDLE MAYO TAPER (NEEDLE)
NEEDLE SCORPION MULTI FIRE (NEEDLE) ×2 IMPLANT
NS IRRIG 1000ML POUR BTL (IV SOLUTION) IMPLANT
PACK ARTHROSCOPY DSU (CUSTOM PROCEDURE TRAY) ×2 IMPLANT
PACK BASIN DAY SURGERY FS (CUSTOM PROCEDURE TRAY) ×2 IMPLANT
PASSER SUT SWANSON 36MM LOOP (INSTRUMENTS) IMPLANT
PENCIL BUTTON HOLSTER BLD 10FT (ELECTRODE) ×2 IMPLANT
SET ARTHROSCOPY TUBING (MISCELLANEOUS) ×1
SET ARTHROSCOPY TUBING LN (MISCELLANEOUS) ×1 IMPLANT
SLEEVE SCD COMPRESS KNEE MED (MISCELLANEOUS) ×2 IMPLANT
SLING ARM FOAM STRAP LRG (SOFTGOODS) IMPLANT
SLING ARM FOAM STRAP MED (SOFTGOODS) IMPLANT
SLING ARM FOAM STRAP XLG (SOFTGOODS) IMPLANT
SLING ARM IMMOBILIZER LRG (SOFTGOODS) IMPLANT
SLING ARM IMMOBILIZER MED (SOFTGOODS) IMPLANT
SPONGE GAUZE 4X4 12PLY (GAUZE/BANDAGES/DRESSINGS) ×4 IMPLANT
SPONGE LAP 4X18 X RAY DECT (DISPOSABLE) IMPLANT
STRIP CLOSURE SKIN 1/2X4 (GAUZE/BANDAGES/DRESSINGS) IMPLANT
SUCTION FRAZIER TIP 10 FR DISP (SUCTIONS) IMPLANT
SUT ETHIBOND 2 OS 4 DA (SUTURE) IMPLANT
SUT ETHILON 2 0 FS 18 (SUTURE) IMPLANT
SUT ETHILON 3 0 PS 1 (SUTURE) ×2 IMPLANT
SUT FIBERWIRE #2 38 T-5 BLUE (SUTURE)
SUT RETRIEVER MED (INSTRUMENTS) IMPLANT
SUT TIGER TAPE 7 IN WHITE (SUTURE) ×2 IMPLANT
SUT VIC AB 0 CT1 27 (SUTURE)
SUT VIC AB 0 CT1 27XBRD ANBCTR (SUTURE) IMPLANT
SUT VIC AB 2-0 SH 27 (SUTURE)
SUT VIC AB 2-0 SH 27XBRD (SUTURE) IMPLANT
SUT VIC AB 3-0 FS2 27 (SUTURE) IMPLANT
SUTURE FIBERWR #2 38 T-5 BLUE (SUTURE) IMPLANT
TAPE FIBER 2MM 7IN #2 BLUE (SUTURE) ×2 IMPLANT
TOWEL OR 17X24 6PK STRL BLUE (TOWEL DISPOSABLE) ×2 IMPLANT
WATER STERILE IRR 1000ML POUR (IV SOLUTION) ×2 IMPLANT
YANKAUER SUCT BULB TIP NO VENT (SUCTIONS) IMPLANT

## 2013-08-02 NOTE — Progress Notes (Signed)
Assisted Dr. Hodierne with right, ultrasound guided, interscalene  block. Side rails up, monitors on throughout procedure. See vital signs in flow sheet. Tolerated Procedure well. 

## 2013-08-02 NOTE — Transfer of Care (Signed)
Immediate Anesthesia Transfer of Care Note  Patient: [name]  Procedure(s) Performed: Procedure(s): SHOULDER ARTHROSCOPY WITH ROTATOR CUFF REPAIR AND SUBACROMIAL DECOMPRESSION, EXTENSIVE DEBRIDEMENT (INCLUDING LABRUM), PARTIAL ACROMIPLASY WITH CORACOACROMIAL RELEASE, DISTAL CLAVICULECTOMY (Right)  Patient Location: PACU  Anesthesia Type:General  Level of Consciousness: awake and sedated  Airway & Oxygen Therapy: Patient Spontanous Breathing and Patient connected to face mask oxygen  Post-op Assessment: Report given to PACU RN and Post -op Vital signs reviewed and stable  Post vital signs: Reviewed and stable  Complications: No apparent anesthesia complications

## 2013-08-02 NOTE — Interval H&P Note (Signed)
History and Physical Interval Note:  08/02/2013 7:33 AM  [name]  has presented today for surgery, with the diagnosis of RIGHT SHOULDER DEGENERATIVE ARTHRITIS, RUPTURED ROTATOR CUFF, IMPENGEMENT SYNDROME  The various methods of treatment have been discussed with the patient and family. After consideration of risks, benefits and other options for treatment, the patient has consented to  Procedure(s): SHOULDER ARTHROSCOPY WITH ROTATOR CUFF REPAIR AND SUBACROMIAL DECOMPRESSION, EXTENSIVE DEBRIDMENT, PARTIAL ACROMIPLASY WITH CORACOACROMIAL RELEASE, DISTAL CLAVICULECTOMY (Right) as a surgical intervention .  The patient's history has been reviewed, patient examined, no change in status, stable for surgery.  I have reviewed the patient's chart and labs.  Questions were answered to the patient's satisfaction.     MURPHY,DANIEL F

## 2013-08-02 NOTE — Anesthesia Postprocedure Evaluation (Signed)
Anesthesia Post Note  Patient: [name]  Procedure(s) Performed: Procedure(s) (LRB): SHOULDER ARTHROSCOPY WITH ROTATOR CUFF REPAIR AND SUBACROMIAL DECOMPRESSION, EXTENSIVE DEBRIDEMENT (INCLUDING LABRUM), PARTIAL ACROMIPLASY WITH CORACOACROMIAL RELEASE, DISTAL CLAVICULECTOMY (Right)  Anesthesia type: General  Patient location: PACU  Post pain: Pain level controlled and Adequate analgesia  Post assessment: Post-op Vital signs reviewed, Patient's Cardiovascular Status Stable, Respiratory Function Stable, Patent Airway and Pain level controlled  Last Vitals:  Filed Vitals:   08/02/13 1115  BP: 111/64  Pulse: 85  Temp:   Resp: 17    Post vital signs: Reviewed and stable  Level of consciousness: awake, alert  and oriented  Complications: No apparent anesthesia complications

## 2013-08-02 NOTE — Anesthesia Procedure Notes (Addendum)
Anesthesia Regional Block:  Interscalene brachial plexus block  Pre-Anesthetic Checklist: ,, timeout performed, Correct Patient, Correct Site, Correct Laterality, Correct Procedure, Correct Position, site marked, Risks and benefits discussed,  Surgical consent,  Pre-op evaluation,  At surgeon's request and post-op pain management  Laterality: Right  Prep: chloraprep       Needles:  Injection technique: Single-shot  Needle Type: Echogenic Stimulator Needle     Needle Length: 5cm 5 cm Needle Gauge: 22 and 22 G    Additional Needles:  Procedures: ultrasound guided (picture in chart) and nerve stimulator Interscalene brachial plexus block  Nerve Stimulator or Paresthesia:  Response: biceps flexion, 0.45 mA,   Additional Responses:   Narrative:  Start time: 08/02/2013 8:30 AM End time: 08/02/2013 8:41 AM Injection made incrementally with aspirations every 5 mL.  Performed by: Personally  Anesthesiologist: Dr Chaney Malling  Additional Notes: Functioning IV was confirmed and monitors were applied.  A 50mm 22ga Arrow echogenic stimulator needle was used. Sterile prep and drape,hand hygiene and sterile gloves were used.  Negative aspiration and negative test dose prior to incremental administration of local anesthetic. The patient tolerated the procedure well.  Ultrasound guidance: relevent anatomy identified, needle position confirmed, local anesthetic spread visualized around nerve(s), vascular puncture avoided.  Image printed for medical record.   Interscalene brachial plexus block Procedure Name: Intubation Performed by: York Grice Pre-anesthesia Checklist: Patient identified, Timeout performed, Emergency Drugs available, Suction available and Patient being monitored Patient Re-evaluated:Patient Re-evaluated prior to inductionOxygen Delivery Method: Circle system utilized Preoxygenation: Pre-oxygenation with 100% oxygen Intubation Type: IV induction Ventilation: Mask  ventilation without difficulty Laryngoscope Size: 2 Grade View: Grade I Tube type: Oral Tube size: 7.0 mm Number of attempts: 1 Airway Equipment and Method: Stylet Placement Confirmation: ETT inserted through vocal cords under direct vision,  breath sounds checked- equal and bilateral and positive ETCO2 Secured at: 21 cm Tube secured with: Tape Dental Injury: Teeth and Oropharynx as per pre-operative assessment

## 2013-08-02 NOTE — Anesthesia Preprocedure Evaluation (Signed)
Anesthesia Evaluation  Patient identified by MRN, date of birth, ID band Patient awake    Reviewed: Allergy & Precautions, H&P , NPO status , Patient's Chart, lab work & pertinent test results  History of Anesthesia Complications (+) PONV  Airway Mallampati: II  Neck ROM: full    Dental   Pulmonary neg pulmonary ROS,          Cardiovascular hypertension,     Neuro/Psych  Neuromuscular disease    GI/Hepatic   Endo/Other  Hypothyroidism   Renal/GU      Musculoskeletal  (+) Arthritis -,   Abdominal   Peds  Hematology   Anesthesia Other Findings   Reproductive/Obstetrics                           Anesthesia Physical Anesthesia Plan  ASA: II  Anesthesia Plan: General and Regional   Post-op Pain Management: MAC Combined w/ Regional for Post-op pain   Induction: Intravenous  Airway Management Planned: Oral ETT  Additional Equipment:   Intra-op Plan:   Post-operative Plan: Extubation in OR  Informed Consent: I have reviewed the patients History and Physical, chart, labs and discussed the procedure including the risks, benefits and alternatives for the proposed anesthesia with the patient or authorized representative who has indicated his/her understanding and acceptance.     Plan Discussed with: CRNA, Anesthesiologist and Surgeon  Anesthesia Plan Comments:         Anesthesia Quick Evaluation

## 2013-08-03 ENCOUNTER — Encounter (HOSPITAL_BASED_OUTPATIENT_CLINIC_OR_DEPARTMENT_OTHER): Payer: Self-pay | Admitting: Orthopedic Surgery

## 2013-08-03 NOTE — Op Note (Signed)
NAMEZAYANNA, [name] NO.:  [id number]  MEDICAL RECORD NO.:  [id number]  LOCATION:                               FACILITY:  [facility]  PHYSICIAN:  Loreta Ave, M.D. DATE OF BIRTH:  [birth year]  DATE OF PROCEDURE:  08/02/2013 DATE OF DISCHARGE:  08/02/2013                              OPERATIVE REPORT   PREOPERATIVE DIAGNOSES:  Right shoulder impingement, distal clavicle DJD, rotator cuff tear.  POSTOPERATIVE DIAGNOSES:  Right shoulder impingement, distal clavicle DJD, rotator cuff tear with also significant degenerative tearing labrum.  PROCEDURE:  Right shoulder exam under anesthesia, arthroscopy. Debridement of labrum.  Debridement, mobilization, and repair of rotator cuff, FiberWeave suture x2, SwiveLock anchors x2.  Bursectomy, acromioplasty, CA ligament release.  Excision of distal clavicle.  SURGEON:  Loreta Ave, M.D.  ASSISTANT:  Odelia Gage, PA present throughout the entire case and necessary for timely completion of procedure.  ANESTHESIA:  General.  BLOOD LOSS:  Minimal.  SPECIMENS:  None.  CULTURES:  None.  COMPLICATIONS:  None.  DRESSINGS:  Soft compressive shoulder immobilizer.  DESCRIPTION OF PROCEDURE:  The patient brought to operating room, placed on the operating table in supine position.  After adequate anesthesia had been obtained, shoulder examined.  Full motion and stable shoulder. Placed in beach-chair position on shoulder positioner, prepped and draped in usual sterile fashion.  Three portals; anterior, posterior, lateral.  Arthroscope introduced, shoulder was inspected.  Considerable tearing, degenerative labrum, circumferentially debrided.  Glenohumeral joint otherwise did look bad.  Biceps tendon and biceps anchor intact. Full-thickness tear entire supraspinatus including the anterior anchor. I debrided above and below and mobilized.  Cannula was redirected subacromially.  Type 2 acromion.  Marked spurring AC  joint. Periarticular spurs __________ cm of clavicle resected.  Acromioplasty to a type 1 acromion with shaver and high-speed bur releasing the CA ligament.  With the lateral cannula, the cuff was captured with 2 horizontal mattress sutures, firmly anchored down the tuberosity, 2 SwiveLock anchors.  Adequacy of repair, decompression confirmed viewing from all portals. Instruments were removed.  Portals were closed with nylon.  Sterile compressive dressing applied.  Shoulder immobilizer applied.  Anesthesia reversed.  Brought to the recovery room.  Tolerated the surgery well. No complications.     Loreta Ave, M.D.     DFM/MEDQ  D:  08/02/2013  T:  08/03/2013  Job:  [id number]

## 2013-09-04 ENCOUNTER — Encounter: Payer: Self-pay | Admitting: Nurse Practitioner

## 2013-09-10 ENCOUNTER — Ambulatory Visit (INDEPENDENT_AMBULATORY_CARE_PROVIDER_SITE_OTHER): Payer: Medicare Other | Admitting: Nurse Practitioner

## 2013-09-10 ENCOUNTER — Encounter: Payer: Self-pay | Admitting: Nurse Practitioner

## 2013-09-10 VITALS — BP 122/78 | HR 88 | Ht 68.75 in | Wt 185.0 lb

## 2013-09-10 DIAGNOSIS — Z01419 Encounter for gynecological examination (general) (routine) without abnormal findings: Secondary | ICD-10-CM

## 2013-09-10 NOTE — Progress Notes (Signed)
Patient ID: [name], female   DOB: 09/12/1946, 67 y.o.   MRN: [id number]  67 y.o. [id number] Married Caucasian Fe here for annual exam.  Feels well except pain left hip.  Having problems with insomnia.  She had right shoulder surgery done 5 weeks ago and still in PT with that.  No LMP recorded. Patient has had a hysterectomy.          Sexually active: no  The current method of family planning is none.    Exercising: yes  Gym/ health club routine includes water exercise and incumbant bike 2-3 times per week. Smoker:  no  Health Maintenance: Pap:  none, TVH MMG:  08/23/12 Bi-Rads 1: negative Colonoscopy:  2014, repeat in 3 years, polyps BMD:  03/18/06 1.146 hip/0.438 radius/spine-metal screws/plate TDaP:  UTD current per pt. At PCP Shingles vaccine: has declined Labs:  PCP   reports that she has never smoked. She has never used smokeless tobacco. She reports that she does not drink alcohol or use illicit drugs.  Past Medical History  Diagnosis Date  . Hyperlipidemia   . Hypothyroid   . PONV (postoperative nausea and vomiting)   . Vitamin B12 deficiency   . HTN (hypertension)     under control with meds., has been on med. x 20 yr.  . 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 Rupture of right rotator cuff 07/2013  . Impingement syndrome of right shoulder 07/2013  . Wears partial dentures     lower  . Fibromyalgia   . IBS (irritable bowel syndrome)     Past Surgical History  Procedure Laterality Date  . Ganglion cyst excision Right     wrist  . Thumb surgery  2/13 & ?2010    Bilateral - states a joint was replaced  . Incontinence surgery  07/05/2006    cystourethropexy  . Cystocele repair  07/05/2006  . Cysto  07/05/2006  . Strabismus surgery Right 01/26/2008    2 other surgeries previously  . Anterior lat lumbar fusion  10/08/2008    L3-4  . Back surgery    . Total vaginal hysterectomy      secondary AUB  . Foot surgery Right   . Orif forearm  fracture Right 09/2012  . Shoulder arthroscopy with rotator cuff repair and subacromial decompression Right 08/02/2013    Procedure: SHOULDER ARTHROSCOPY WITH ROTATOR CUFF REPAIR AND SUBACROMIAL DECOMPRESSION, EXTENSIVE DEBRIDEMENT (INCLUDING LABRUM), PARTIAL ACROMIPLASY WITH CORACOACROMIAL RELEASE, DISTAL CLAVICULECTOMY;  Surgeon: Loreta Ave, MD;  Location: [facility];  Service: Orthopedics;  Laterality: Right;  . Colonoscopy w/ biopsies  11/2009    polyps  . Colonoscopy w/ biopsies  11/2012    polyp, recheck in 3 years  . Esophagogastroduodenoscopy endoscopy  05/25/05    erythema and edema of gastric lumen    Current Outpatient Prescriptions  Medication Sig Dispense Refill  . benazepril (LOTENSIN) 20 MG tablet Take 20 mg by mouth daily.      . Fish Oil-Cholecalciferol (FISH OIL + D3 PO) Take by mouth.      Marland Kitchen gemfibrozil (LOPID) 600 MG tablet Take 600 mg by mouth daily.      . hydrochlorothiazide (HYDRODIURIL) 50 MG tablet Take 25 mg by mouth daily.       Marland Kitchen levothyroxine (SYNTHROID, LEVOTHROID) 100 MCG tablet Take 88 mcg by mouth daily.       . vitamin B-12 (CYANOCOBALAMIN) 100 MCG tablet Take 5,000 mcg by mouth daily.  No current facility-administered medications for this visit.    Family History  Problem Relation Age of Onset  . Aortic aneurysm Father   . Hypertension Father   . Coronary artery disease Mother 29  . Hypertension Mother   . Hyperlipidemia Brother     brain tumor unknown if malignant    ROS:  Pertinent items are noted in HPI.  Otherwise, a comprehensive ROS was negative.  Exam:   BP 122/78  Pulse 88  Ht 5' 8.75" (1.746 m)  Wt 185 lb (83.915 kg)  BMI 27.53 kg/m2 Height: 5' 8.75" (174.6 cm)  Ht Readings from Last 3 Encounters:  09/10/13 5' 8.75" (1.746 m)  08/02/13 5\' 8"  (1.727 m)  08/02/13 5\' 8"  (1.727 m)    General appearance: alert, cooperative and appears stated age Head: Normocephalic, without obvious abnormality,  atraumatic Neck: no adenopathy, supple, symmetrical, trachea midline and thyroid normal to inspection and palpation Lungs: clear to auscultation bilaterally Breasts: normal appearance, no masses or tenderness Heart: regular rate and rhythm Abdomen: soft, non-tender; no masses,  no organomegaly Extremities: extremities normal, atraumatic, no cyanosis or edema Skin: Skin color, texture, turgor normal. No rashes or lesions Lymph nodes: Cervical, supraclavicular, and axillary nodes normal. No abnormal inguinal nodes palpated Neurologic: Grossly normal   Pelvic: External genitalia:  no lesions              Urethra:  normal appearing urethra with no masses, tenderness or lesions              Bartholin's and Skene's: normal                 Vagina: normal appearing vagina with normal color and discharge, no lesions              Cervix: absent              Pap taken: no Bimanual Exam:  Uterus:  uterus absent              Adnexa: no mass, fullness, tenderness               Rectovaginal: Confirms               Anus:  normal sphincter tone, no lesions  A:  Well Woman with normal exam  S/P TVH secondary to AUB age 25  S/P cystocele repair 10/07  History of arthritis and joint replacement of thumbs, recent right shoulder surgery  P:   Pap smear as per guidelines Not indicated  Mammogram due now and will schedule  Counseled on breast self exam, mammography screening, adequate intake of calcium and vitamin D, diet and exercise, Kegel's exercises return annually or prn  An After Visit Summary was printed and given to the patient.

## 2013-09-10 NOTE — Patient Instructions (Signed)

## 2013-09-11 NOTE — Progress Notes (Signed)
Encounter reviewed by Dr. Cherolyn Behrle Silva.  

## 2014-01-08 ENCOUNTER — Other Ambulatory Visit: Payer: Self-pay

## 2014-01-08 DIAGNOSIS — Z1231 Encounter for screening mammogram for malignant neoplasm of breast: Secondary | ICD-10-CM

## 2014-01-30 ENCOUNTER — Encounter (INDEPENDENT_AMBULATORY_CARE_PROVIDER_SITE_OTHER): Payer: Self-pay

## 2014-01-30 ENCOUNTER — Ambulatory Visit
Admission: RE | Admit: 2014-01-30 | Discharge: 2014-01-30 | Disposition: A | Discharge status: Home / Self Care | Payer: Medicare Other | Source: Ambulatory Visit

## 2014-01-30 DIAGNOSIS — Z1231 Encounter for screening mammogram for malignant neoplasm of breast: Secondary | ICD-10-CM

## 2014-07-15 ENCOUNTER — Encounter: Payer: Self-pay | Admitting: Nurse Practitioner

## 2014-08-12 ENCOUNTER — Telehealth: Payer: Self-pay | Admitting: Nurse Practitioner

## 2014-08-12 NOTE — Telephone Encounter (Signed)
Pt called to schedule annual appointment and also requested to speak to someone regarding some recent issues.  Pt had recent MRI of her back - told she had cysts on her ovaries. She has also had urinary issues recently.  Pt scheduled for annual, but requests call regarding other issues.   Best: [phone]

## 2014-08-12 NOTE — Telephone Encounter (Signed)
Spoke with patient. Patient states that she had an MRI of her back 3 months ago and was told she has cysts on her ovaries. Patient denies any abdominal, pelvic, or back pain. Patient has also been experiencing cloudy urine that has an odor for 1 month. Patient was seen at PCP last week and had a urinalysis done which was normal. "Then over the weekend it started back. Something has to be causing this." Denies any other urinary symptoms. Denies fever.Patient was last seen for aex on 09/10/13. Next aex scheduled for 10/17/14. Advised patient will need to be seen for evaluation. Patient is agreeable. Office visit scheduled for 12/3 at 11:15am with Lauro FranklinPatricia Rolen-Grubb, FNP. Patient is agreeable to date and time.Will bring results from MRI to appointment for review.  Routing to provider for final review. Patient agreeable to disposition. Will close encounter

## 2014-08-15 ENCOUNTER — Encounter: Payer: Self-pay | Admitting: Nurse Practitioner

## 2014-08-15 ENCOUNTER — Ambulatory Visit (INDEPENDENT_AMBULATORY_CARE_PROVIDER_SITE_OTHER): Payer: Medicare Other | Admitting: Nurse Practitioner

## 2014-08-15 VITALS — BP 118/74 | HR 86 | Ht 68.75 in | Wt 188.0 lb

## 2014-08-15 DIAGNOSIS — N83209 Unspecified ovarian cyst, unspecified side: Secondary | ICD-10-CM

## 2014-08-15 DIAGNOSIS — N8320 Unspecified ovarian cysts: Secondary | ICD-10-CM

## 2014-08-15 DIAGNOSIS — R350 Frequency of micturition: Secondary | ICD-10-CM

## 2014-08-15 LAB — POCT URINALYSIS DIPSTICK
Bilirubin, UA: NEGATIVE
Glucose, UA: NEGATIVE
Ketones, UA: NEGATIVE
Nitrite, UA: POSITIVE
Protein, UA: NEGATIVE
Urobilinogen, UA: NEGATIVE
pH, UA: 5

## 2014-08-15 MED ORDER — CIPROFLOXACIN HCL 500 MG PO TABS
500.0000 mg | ORAL_TABLET | Freq: Two times a day (BID) | ORAL | Status: DC
Start: 2014-08-15 — End: 2014-09-12

## 2014-08-15 NOTE — Patient Instructions (Signed)

## 2014-08-15 NOTE — Progress Notes (Signed)
S:  68 y.o.Married white female presents with complaint of UTI. Symptoms began on/ off for a few weeks.  With symptoms of burning with urination, urinary frequency, urinary urgency.  Improved with po fluids. Pertinent negatives no constitutional symptoms, denying fever, chills, anorexia, or weight loss.. Not sexually active and symptoms not related to post coital.   Menopausal and does have some vaginal dryness.  Last UTI documented about 08/2013.  She also has concerns about recent MRI for back pain - showing "small OV cysts".  She is S/P TVH for AUB and off ERT since 12/2007.  She has no pain in pelvic region.  ROS: no weight loss, fever, night sweats and feels well  O alert, oriented to person, place, and time   healthy,  alert and  not in acute distress  Suprapubic with mild tenderness  No CVA tenderness  Pelvic: deferred   Diagnostic Test:    Urinalysis:  Cloudy, + nitrates, 3+ leuk's, trace RBC   urine culture and micro   Assessment:  R/O UTI   History of "Small OV cyst" found on MRI 02/20/14 -    (no specifics for size or which ovary)  Plan:  Maintain adequate hydration. Follow up if symptoms not improving, and as needed.   Medication Therapy:  Cipro 500 mg BID # 14   Lab:TOC if Urine Culture is positive and will try and schedule at same time of PUS.   Discussed OV cyst with Dr. Edward JollySilva and will get PUS and follow       RV

## 2014-08-16 LAB — URINALYSIS, MICROSCOPIC ONLY
Casts: NONE SEEN
Crystals: NONE SEEN

## 2014-08-18 LAB — URINE CULTURE: Colony Count: >=100000

## 2014-08-18 NOTE — Progress Notes (Signed)
Encounter reviewed by Dr. Conley SimmondsBrook Silva. Ultrasound will be requested as MRI did not give any detail regarding ovarian cyst size or character.

## 2014-08-23 NOTE — Addendum Note (Signed)
Addended by: Roanna BanningGRUBB, Tamon Parkerson R on: 08/23/2014 10:37 AM   Modules accepted: Orders

## 2014-08-28 ENCOUNTER — Telehealth: Payer: Self-pay | Admitting: Obstetrics and Gynecology

## 2014-08-28 DIAGNOSIS — N83209 Unspecified ovarian cyst, unspecified side: Secondary | ICD-10-CM

## 2014-08-28 NOTE — Telephone Encounter (Signed)
Pt returned call. Unable to hold and requested return call at cell phone number.  bf

## 2014-08-28 NOTE — Telephone Encounter (Signed)
Left message for patient to call back. Need to go over benefits and schedule PUS °

## 2014-08-29 NOTE — Addendum Note (Signed)
Addended by: Joeseph AmorFAST, Mellie Buccellato L on: 08/29/2014 12:56 PM   Modules accepted: Orders

## 2014-08-29 NOTE — Telephone Encounter (Signed)
Spoke with patient. Advised that per benefit quote received, she will be responsible to pay $107.13 when she comes in for PUS. Patient agreeable. Scheduled PUS. Advised patient of 72 hour cancellation policy and $100 cancellation fee. Patient agreeable.

## 2014-09-12 ENCOUNTER — Ambulatory Visit (INDEPENDENT_AMBULATORY_CARE_PROVIDER_SITE_OTHER): Payer: Medicare Other | Admitting: Obstetrics and Gynecology

## 2014-09-12 ENCOUNTER — Encounter: Payer: Self-pay | Admitting: Obstetrics and Gynecology

## 2014-09-12 ENCOUNTER — Ambulatory Visit (INDEPENDENT_AMBULATORY_CARE_PROVIDER_SITE_OTHER): Payer: Medicare Other

## 2014-09-12 VITALS — BP 100/60 | HR 76 | Ht 68.75 in | Wt 187.0 lb

## 2014-09-12 DIAGNOSIS — N83209 Unspecified ovarian cyst, unspecified side: Secondary | ICD-10-CM

## 2014-09-12 DIAGNOSIS — N83201 Unspecified ovarian cyst, right side: Secondary | ICD-10-CM

## 2014-09-12 DIAGNOSIS — N8320 Unspecified ovarian cysts: Secondary | ICD-10-CM

## 2014-09-12 DIAGNOSIS — N83202 Unspecified ovarian cyst, left side: Principal | ICD-10-CM

## 2014-09-12 HISTORY — DX: Unspecified ovarian cyst, right side: N83.201

## 2014-09-12 NOTE — Patient Instructions (Signed)
Ovarian Cyst An ovarian cyst is a fluid-filled sac that forms on an ovary. The ovaries are small organs that produce eggs in women. Various types of cysts can form on the ovaries. Most are not cancerous. Many do not cause problems, and they often go away on their own. Some may cause symptoms and require treatment. Common types of ovarian cysts include:  Functional cysts--These cysts may occur every month during the menstrual cycle. This is normal. The cysts usually go away with the next menstrual cycle if the woman does not get pregnant. Usually, there are no symptoms with a functional cyst.  Endometrioma cysts--These cysts form from the tissue that lines the uterus. They are also called "chocolate cysts" because they become filled with blood that turns brown. This type of cyst can cause pain in the lower abdomen during intercourse and with your menstrual period.  Cystadenoma cysts--This type develops from the cells on the outside of the ovary. These cysts can get very big and cause lower abdomen pain and pain with intercourse. This type of cyst can twist on itself, cut off its blood supply, and cause severe pain. It can also easily rupture and cause a lot of pain.  Dermoid cysts--This type of cyst is sometimes found in both ovaries. These cysts may contain different kinds of body tissue, such as skin, teeth, hair, or cartilage. They usually do not cause symptoms unless they get very big.  Theca lutein cysts--These cysts occur when too much of a certain hormone (human chorionic gonadotropin) is produced and overstimulates the ovaries to produce an egg. This is most common after procedures used to assist with the conception of a baby (in vitro fertilization). CAUSES   Fertility drugs can cause a condition in which multiple large cysts are formed on the ovaries. This is called ovarian hyperstimulation syndrome.  A condition called polycystic ovary syndrome can cause hormonal imbalances that can lead to  nonfunctional ovarian cysts. SIGNS AND SYMPTOMS  Many ovarian cysts do not cause symptoms. If symptoms are present, they may include:  Pelvic pain or pressure.  Pain in the lower abdomen.  Pain during sexual intercourse.  Increasing girth (swelling) of the abdomen.  Abnormal menstrual periods.  Increasing pain with menstrual periods.  Stopping having menstrual periods without being pregnant. DIAGNOSIS  These cysts are commonly found during a routine or annual pelvic exam. Tests may be ordered to find out more about the cyst. These tests may include:  Ultrasound.  X-ray of the pelvis.  CT scan.  MRI.  Blood tests. TREATMENT  Many ovarian cysts go away on their own without treatment. Your health care provider may want to check your cyst regularly for 2-3 months to see if it changes. For women in menopause, it is particularly important to monitor a cyst closely because of the higher rate of ovarian cancer in menopausal women. When treatment is needed, it may include any of the following:  A procedure to drain the cyst (aspiration). This may be done using a long needle and ultrasound. It can also be done through a laparoscopic procedure. This involves using a thin, lighted tube with a tiny camera on the end (laparoscope) inserted through a small incision.  Surgery to remove the whole cyst. This may be done using laparoscopic surgery or an open surgery involving a larger incision in the lower abdomen.  Hormone treatment or birth control pills. These methods are sometimes used to help dissolve a cyst. HOME CARE INSTRUCTIONS   Only take over-the-counter   or prescription medicines as directed by your health care provider.  Follow up with your health care provider as directed.  Get regular pelvic exams and Pap tests. SEEK MEDICAL CARE IF:   Your periods are late, irregular, or painful, or they stop.  Your pelvic pain or abdominal pain does not go away.  Your abdomen becomes  larger or swollen.  You have pressure on your bladder or trouble emptying your bladder completely.  You have pain during sexual intercourse.  You have feelings of fullness, pressure, or discomfort in your stomach.  You lose weight for no apparent reason.  You feel generally ill.  You become constipated.  You lose your appetite.  You develop acne.  You have an increase in body and facial hair.  You are gaining weight, without changing your exercise and eating habits.  You think you are pregnant. SEEK IMMEDIATE MEDICAL CARE IF:   You have increasing abdominal pain.  You feel sick to your stomach (nauseous), and you throw up (vomit).  You develop a fever that comes on suddenly.  You have abdominal pain during a bowel movement.  Your menstrual periods become heavier than usual. MAKE SURE YOU:  Understand these instructions.  Will watch your condition.  Will get help right away if you are not doing well or get worse. Document Released: 08/30/2005 Document Revised: 09/04/2013 Document Reviewed: 05/07/2013 Exit[employer] Patient Information 2015 [employer]. This information is not intended to replace advice given to you by your health care provider. Make sure you discuss any questions you have with your health care provider.  

## 2014-09-12 NOTE — Progress Notes (Signed)
Subjective  Patient is here for pelvic ultrasound today to follow up ovarian cysts noted on MRI ordered to treat back pain.  No specifics were given about the cysts on the MRI.  Patient will be having back surgery and a rod placed.  History of prior back surgeries.   Status post TVH.  Off HRT since 2009.   History of an ovarian cyst in her late 3720s.  It ruptured and patient did not need surgery.   Objective  Pelvic ultrasound  - images and report reviewed with patient.  Uterus absent.   Right ovarian simple cyst - 14 mm, no vascular features, smooth. Left ovarian simple cyst - 21 mm, no vascular features, smooth. No free fluid.     Assessment  Bilateral simple ovarian cysts. Appear benign.   Plan  Discussed ovarian cysts and the expected benign nature of these cysts. CA125.  Repeat ultrasound in 3 months.  I am not recommending intervention with surgical removal at this time.   15 minutes face to face time of which over 50% was spent in counseling.   After visit summary to patient.

## 2014-09-13 LAB — CA 125: CA 125: 7 U/mL (ref <35)

## 2014-10-03 HISTORY — PX: BACK SURGERY: SHX140

## 2014-10-17 ENCOUNTER — Ambulatory Visit: Payer: Medicare Other | Admitting: Nurse Practitioner

## 2014-11-06 ENCOUNTER — Telehealth: Payer: Self-pay | Admitting: Obstetrics and Gynecology

## 2014-11-06 NOTE — Telephone Encounter (Signed)
Patient returned call.  Advised of benefit quote received for when she comes in for 3 month follow up PUS. Patient agreeable.  Patient does not want to schedule for the month of March because she is recovering from a major back surgery. She will check her calendar and will call back to schedule.

## 2014-11-06 NOTE — Telephone Encounter (Signed)
Please place patient in "hold" for pelvic ultrasound.  I recommended March and she would like to call back and schedule.   I think we need to contact her back in April to see about scheduling then.  Thank you.   Cc- Cathrine MusterSabrina Franklin

## 2014-11-06 NOTE — Telephone Encounter (Signed)
Correction:  3 month follow up PUS

## 2014-11-06 NOTE — Telephone Encounter (Signed)
Left message for patient to call back. Need to go over benefits and schedule 6 month follow up PUS °

## 2014-12-03 ENCOUNTER — Telehealth: Payer: Self-pay | Admitting: Obstetrics and Gynecology

## 2014-12-03 NOTE — Telephone Encounter (Signed)
Patient is ready to schedule her follow up ultrasound. Last seen 09/12/14.

## 2014-12-03 NOTE — Telephone Encounter (Signed)
Call to patient. Reiterated  Benefit quote received for PUS. Patient agreeable. Scheduled PUS. Advised patient of 72 hour cancellation policy and $100 cancellation fee. Patient agreeable.

## 2014-12-12 ENCOUNTER — Ambulatory Visit (INDEPENDENT_AMBULATORY_CARE_PROVIDER_SITE_OTHER): Payer: Medicare Other | Admitting: Obstetrics and Gynecology

## 2014-12-12 ENCOUNTER — Encounter: Payer: Self-pay | Admitting: Obstetrics and Gynecology

## 2014-12-12 ENCOUNTER — Ambulatory Visit (INDEPENDENT_AMBULATORY_CARE_PROVIDER_SITE_OTHER): Payer: Medicare Other

## 2014-12-12 VITALS — BP 110/70 | HR 70 | Ht 68.75 in | Wt 183.0 lb

## 2014-12-12 DIAGNOSIS — G4700 Insomnia, unspecified: Secondary | ICD-10-CM

## 2014-12-12 DIAGNOSIS — N8320 Unspecified ovarian cysts: Secondary | ICD-10-CM

## 2014-12-12 DIAGNOSIS — N83202 Unspecified ovarian cyst, left side: Principal | ICD-10-CM

## 2014-12-12 DIAGNOSIS — N83201 Unspecified ovarian cyst, right side: Secondary | ICD-10-CM

## 2014-12-12 NOTE — Progress Notes (Signed)
Subjective  Patient is here today for pelvic ultrasound for follow up of bilateral ovarian cysts noted on MRI done for back pain.  Status post prior TVH.   Had back surgery on 10/03/14.   Had a cage and screws placed.  Recovering well overall.   Last ultrasound was done on 09/12/14. Uterus absent.  Right ovarian simple cyst - 14 mm, no vascular features, smooth. Left ovarian simple cyst - 21 mm, no vascular features, smooth. No free fluid.   CA125 - 7 on 09/12/14.  Not on ERT.  No current symptoms of pelvic pain or discomfort.   Sleep disturbance for a couple of years.  Dr. Jeannetta NapElkins is PCP.  Took Amytriptaline from PCP for insomnia and fibromyalgia symptoms.  Had lasting effects so stopped this. No caffeine use.  Exercising in pool twice a week.  Uses oxycodone rarely for back pain.  Denies snoring.  No TV on at night in bedroom.   Objective  Pelvic ultrasound images and report reviewed with patient.   Uterus absent.  Right ovary - 14 mm simple cyst. Left ovary - 24 mm simple cyst. No free fluid.    Assessment  Bilateral simple ovarian cysts.  Unchanged. Chronic insomnia.  Fibromyalgia. Recent back surgery.   Plan  Discussed ovarian cysts.  Will plan for a repeat ultrasound in 6 months.  I discussed good sleep hygiene with patient.  Can try OTC benadryl.  I recommend follow up with PCP as insomnia is a chronic issue for patient.   15 minutes face to face time of which over 50% was spent in counseling.   After visit summary to patient.

## 2015-01-22 ENCOUNTER — Telehealth: Payer: Self-pay | Admitting: Emergency Medicine

## 2015-01-22 NOTE — Telephone Encounter (Signed)
-----   Message from Isabel SallesBrook E Amundson C Silva, MD sent at 12/30/2014 12:44 PM EDT ----- Regarding: RE: Imaging hold Please contact patient to schedule her 6 month recheck ultrasound appointment.  One of the cysts got slightly larger from December 2015 to March 2016.  Our plan was to do a recheck in September 2016.   You may remove her from this ultrasound hold.  Thanks,   Conley SimmondsBrook Morgan ----- Message -----    From: Joeseph Amorracy L Eldana Isip, RN    Sent: 12/30/2014  12:26 PM      To: Isabel Rosalin HawkingE Amundson C Silva, MD Subject: Imaging hold                                   Dr. Edward JollySilva,  This patient was in imaging hold until PUS in office.  Completed imaging 12/12/14. Okay to remove from hold?

## 2015-01-22 NOTE — Telephone Encounter (Signed)
Message left to return call to [name] at [phone].    

## 2015-01-22 NOTE — Telephone Encounter (Deleted)
Message left to return call to [name] at [phone].    

## 2015-01-30 ENCOUNTER — Other Ambulatory Visit: Payer: Self-pay

## 2015-01-30 DIAGNOSIS — Z1231 Encounter for screening mammogram for malignant neoplasm of breast: Secondary | ICD-10-CM

## 2015-01-30 NOTE — Telephone Encounter (Signed)
Patient returned call and scheduled Pelvic ultrasound for 05/22/15 with Dr. Edward JollySilva.   Patient verbalized understanding of the [facility] appointment cancellation policy. Advised will need to cancel or reschedule within 72 business hours of appointment (3 business days) or will have $100.00 late cancellation fee placed to account.     Routing to provider for final review. Patient agreeable to disposition. Will close encounter.    .Marland Kitchen

## 2015-02-07 ENCOUNTER — Ambulatory Visit
Admission: RE | Admit: 2015-02-07 | Discharge: 2015-02-07 | Disposition: A | Discharge status: Home / Self Care | Payer: Medicare Other | Source: Ambulatory Visit

## 2015-02-07 DIAGNOSIS — Z1231 Encounter for screening mammogram for malignant neoplasm of breast: Secondary | ICD-10-CM

## 2015-03-13 ENCOUNTER — Ambulatory Visit (INDEPENDENT_AMBULATORY_CARE_PROVIDER_SITE_OTHER): Payer: Medicare Other | Admitting: Nurse Practitioner

## 2015-03-13 ENCOUNTER — Encounter: Payer: Self-pay | Admitting: Nurse Practitioner

## 2015-03-13 VITALS — BP 110/78 | HR 76 | Ht 68.0 in | Wt 190.4 lb

## 2015-03-13 DIAGNOSIS — Z01419 Encounter for gynecological examination (general) (routine) without abnormal findings: Secondary | ICD-10-CM | POA: Diagnosis not present

## 2015-03-13 DIAGNOSIS — Z0000 Encounter for general adult medical examination without abnormal findings: Secondary | ICD-10-CM | POA: Diagnosis not present

## 2015-03-13 LAB — POCT URINALYSIS DIPSTICK
Leukocytes, UA: NEGATIVE
Nitrite, UA: NEGATIVE
Urobilinogen, UA: NEGATIVE
pH, UA: 5

## 2015-03-13 NOTE — Progress Notes (Signed)
Encounter reviewed by Dr. Janean SarkBrook Amundson C. Morgan. Has appointment for pelvic ultrasound in September 2016 to check ovarian cysts.

## 2015-03-13 NOTE — Progress Notes (Signed)
69 y.o. [id number] Married  Caucasian Fe here for annual exam.  Had lumbar surgery 09/2014 with ORIF. During the evaluation for her back - she had MRI and was noted to have OV cyst.  She then had PUS 09/12/14 and 11/2014.  For a recheck on PUS in 05/2015.  No pain or vaginal bleeding. Shoulder doing well.  Patient's last menstrual period was 09/13/1984.          Sexually active: No. The current method of family planning is status post hysterectomy.    Exercising: Yes.    walking Smoker:  no  Health Maintenance: Pap:  None, TVH MMG:  02/07/15 Colonoscopy:  03/11/13 few polyps repeat in 3 years BMD:   03/18/06 sched for nov 2016 TDaP:  Updated within 2 years Prevnar: done 2015 Labs:  Dr. Talmage NapBalan does labs - normal,  UA neg   reports that she has never smoked. She has never used smokeless tobacco. She reports that she does not drink alcohol or use illicit drugs.  Past Medical History  Diagnosis Date  . Hyperlipidemia   . Hypothyroid   . PONV (postoperative nausea and vomiting)   . Vitamin B12 deficiency   . HTN (hypertension)     under control with meds., has been on med. x 20 yr.  . 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 Rupture of right rotator cuff 07/2013  . Impingement syndrome of right shoulder 07/2013  . Wears partial dentures     lower  . Fibromyalgia   . IBS (irritable bowel syndrome)   . Bilateral ovarian cysts 09/12/14    bilateral simple ovarian cysts    Past Surgical History  Procedure Laterality Date  . Ganglion cyst excision Right     wrist  . Thumb surgery  2/13 & ?2010    Bilateral - states a joint was replaced  . Incontinence surgery  07/05/2006    cystourethropexy  . Cystocele repair  07/05/2006  . Cysto  07/05/2006  . Strabismus surgery Right 01/26/2008    2 other surgeries previously  . Anterior lat lumbar fusion  10/08/2008    L3-4  . Back surgery    . Total vaginal hysterectomy  1986 - or age 69    secondary AUB  . Foot surgery Right    . Orif forearm fracture Right 09/2012  . Shoulder arthroscopy with rotator cuff repair and subacromial decompression Right 08/02/2013    Procedure: SHOULDER ARTHROSCOPY WITH ROTATOR CUFF REPAIR AND SUBACROMIAL DECOMPRESSION, EXTENSIVE DEBRIDEMENT (INCLUDING LABRUM), PARTIAL ACROMIPLASY WITH CORACOACROMIAL RELEASE, DISTAL CLAVICULECTOMY;  Surgeon: Loreta Aveaniel F Murphy, MD;  Location: [facility];  Service: Orthopedics;  Laterality: Right;  . Colonoscopy w/ biopsies  11/2009    polyps  . Colonoscopy w/ biopsies  11/2012    polyp, recheck in 3 years  . Esophagogastroduodenoscopy endoscopy  05/25/05    erythema and edema of gastric lumen  . Back surgery  10-03-14    --[facility]    Current Outpatient Prescriptions  Medication Sig Dispense Refill  . benazepril (LOTENSIN) 20 MG tablet Take 20 mg by mouth daily.    . cholecalciferol (VITAMIN D) 1000 UNITS tablet Take 1,000 Units by mouth. Take 1 tab every other day    . gemfibrozil (LOPID) 600 MG tablet Take 600 mg by mouth daily.    Marland Kitchen. levothyroxine (SYNTHROID, LEVOTHROID) 88 MCG tablet   5  . oxyCODONE-acetaminophen (PERCOCET) 7.5-325 MG per tablet Take 1 tablet by mouth every  6 (six) hours as needed.   0  . vitamin B-12 (CYANOCOBALAMIN) 100 MCG tablet Take 5,000 mcg by mouth. Take 1 tab every other day     No current facility-administered medications for this visit.    Family History  Problem Relation Age of Onset  . Aortic aneurysm Father   . Hypertension Father   . Coronary artery disease Mother 22  . Hypertension Mother   . Hyperlipidemia Brother     brain tumor unknown if malignant    ROS:  Pertinent items are noted in HPI.  Otherwise, a comprehensive ROS was negative.  Exam:   BP 110/78 mmHg  Pulse 76  Ht  (1.727 m)  Wt 190 lb 6.4 oz (86.365 kg)  BMI 28.96 kg/m2  LMP 09/13/1984 Height:  (172.7 cm) Ht Readings from Last 3 Encounters:  03/13/15  (1.727 m)  12/12/14 5' 8.75"  (1.746 m)  09/12/14 5' 8.75" (1.746 m)    General appearance: alert, cooperative and appears stated age Head: Normocephalic, without obvious abnormality, atraumatic Neck: no adenopathy, supple, symmetrical, trachea midline and thyroid normal to inspection and palpation Lungs: clear to auscultation bilaterally Breasts: normal appearance, no masses or tenderness Heart: regular rate and rhythm Abdomen: soft, non-tender; no masses,  no organomegaly Extremities: extremities normal, atraumatic, no cyanosis or edema Skin: Skin color, texture, turgor normal. No rashes or lesions Lymph nodes: Cervical, supraclavicular, and axillary nodes normal. No abnormal inguinal nodes palpated Neurologic: Grossly normal   Pelvic: External genitalia:  no lesions              Urethra:  normal appearing urethra with no masses, tenderness or lesions              Bartholin's and Skene's: normal                 Vagina: normal appearing vagina with normal color and discharge, no lesions              Cervix: absent              Pap taken: No. Bimanual Exam:  Uterus:  uterus absent              Adnexa: no mass, fullness, tenderness               Rectovaginal: Confirms               Anus:  normal sphincter tone, no lesions  Chaperone present: Yes/No  A:  Well Woman with normal exam  S/P TVH secondary to AUB age 40 S/P cystocele repair 10/07 History of arthritis and joint replacement of thumbs, recent right shoulder surgery   P:   Reviewed health and wellness pertinent to exam  Pap smear as above  Mammogram is due 5/17  Counseled on breast self exam, mammography screening, menopause, osteoporosis, adequate intake of calcium and vitamin D, diet and exercise, Kegel's exercises return annually or prn  An After Visit Summary was printed and given to the patient.

## 2015-03-13 NOTE — Patient Instructions (Signed)

## 2015-05-21 ENCOUNTER — Telehealth: Payer: Self-pay | Admitting: Obstetrics and Gynecology

## 2015-05-21 NOTE — Telephone Encounter (Signed)
Patient canceled her pus appointment 9/8/6 via automated reminder call from 05/19/15. Patient would like to reschedule on a Tuesday.

## 2015-05-21 NOTE — Telephone Encounter (Signed)
OK to reschedule an ultrasound appointment on a Tuesday. Preferably she has the ultrasound visit but then sees me for a brief visit at a later date.

## 2015-05-22 ENCOUNTER — Other Ambulatory Visit: Payer: Medicare Other | Admitting: Obstetrics and Gynecology

## 2015-05-22 ENCOUNTER — Other Ambulatory Visit: Payer: Medicare Other

## 2015-05-22 NOTE — Telephone Encounter (Signed)
Spoke with patient. Ultrasound scheduled for 9/13 at 12:30 pm. Patient declines to schedule consult appointment on Wednesday-Friday due to work schedule. Consultation appointment scheduled for 9/ at 9:45 am with Dr.Silva. Patient is agreeable to date and time.  Routing to provider for final review. Patient agreeable to disposition. Will close encounter.

## 2015-05-23 ENCOUNTER — Telehealth: Payer: Self-pay | Admitting: Obstetrics and Gynecology

## 2015-05-23 NOTE — Telephone Encounter (Signed)
Called patient to review benefits for procedure. Left voicemail to call back and review. °

## 2015-05-27 ENCOUNTER — Ambulatory Visit (INDEPENDENT_AMBULATORY_CARE_PROVIDER_SITE_OTHER): Payer: Medicare Other

## 2015-05-27 DIAGNOSIS — N83201 Unspecified ovarian cyst, right side: Secondary | ICD-10-CM

## 2015-05-27 DIAGNOSIS — N83202 Unspecified ovarian cyst, left side: Secondary | ICD-10-CM

## 2015-05-27 DIAGNOSIS — N8320 Unspecified ovarian cysts: Secondary | ICD-10-CM

## 2015-05-27 NOTE — Progress Notes (Signed)
Subjective  69 y.o. [id number]  female here for pelvic ultrasound for follow up of bilateral simple ovarian cysts. Status post TVH.  Last pelvic ultrasound 12/12/14: Uterus absent.  Right ovary - 14 mm simple cyst. Left ovary - 24 mm simple cyst. No free fluid.  CA125 - 7 on 09/12/14.  Objective  Pelvic ultrasound images and report reviewed with patient.  Uterus - absent EMS - NA Ovaries - simple left ovarian cyst 2.23 cm.  No abnormal flow, echofree. Free fluid - no       Assessment  Left ovarian cyst, persistent and stable.  May be slightly smaller. Right ovarian cyst resolved.    Plan  Discussion of ovarian cysts.  Check CA125 today.  Will start to do yearly pelvic ultrasounds if CA 125 stable.  Questions invited and answered.   ___15____ minutes face to face time of which over 50% was spent in counseling.   After visit summary to patient.

## 2015-05-27 NOTE — Progress Notes (Unsigned)
Took patients BP and pulse before leaving office today after PUS. BP was 126/86 and pulse was 74.

## 2015-06-02 ENCOUNTER — Encounter: Payer: Self-pay | Admitting: Obstetrics and Gynecology

## 2015-06-02 ENCOUNTER — Ambulatory Visit (INDEPENDENT_AMBULATORY_CARE_PROVIDER_SITE_OTHER): Payer: Medicare Other | Admitting: Obstetrics and Gynecology

## 2015-06-02 VITALS — BP 108/80 | HR 64 | Resp 16 | Ht 68.0 in | Wt 186.0 lb

## 2015-06-02 DIAGNOSIS — N8320 Unspecified ovarian cysts: Secondary | ICD-10-CM

## 2015-06-02 DIAGNOSIS — N83202 Unspecified ovarian cyst, left side: Secondary | ICD-10-CM

## 2015-06-03 LAB — CA 125: CA 125: 8 U/mL (ref <35)

## 2015-09-14 DIAGNOSIS — N900 Mild vulvar dysplasia: Secondary | ICD-10-CM

## 2015-09-14 HISTORY — DX: Mild vulvar dysplasia: N90.0

## 2015-10-20 ENCOUNTER — Ambulatory Visit: Payer: Medicare Other | Admitting: Cardiology

## 2016-03-29 ENCOUNTER — Ambulatory Visit: Payer: Medicare Other | Admitting: Nurse Practitioner

## 2016-04-01 ENCOUNTER — Encounter: Payer: Self-pay | Admitting: *Deleted

## 2016-04-02 ENCOUNTER — Ambulatory Visit: Payer: Medicare Other | Admitting: Nurse Practitioner

## 2016-04-05 ENCOUNTER — Ambulatory Visit (INDEPENDENT_AMBULATORY_CARE_PROVIDER_SITE_OTHER): Payer: Medicare Other | Admitting: Nurse Practitioner

## 2016-04-05 ENCOUNTER — Encounter: Payer: Self-pay | Admitting: Nurse Practitioner

## 2016-04-05 VITALS — BP 122/80 | HR 68 | Ht 68.0 in | Wt 192.0 lb

## 2016-04-05 DIAGNOSIS — Z0000 Encounter for general adult medical examination without abnormal findings: Secondary | ICD-10-CM

## 2016-04-05 DIAGNOSIS — N9089 Other specified noninflammatory disorders of vulva and perineum: Secondary | ICD-10-CM

## 2016-04-05 DIAGNOSIS — N83201 Unspecified ovarian cyst, right side: Secondary | ICD-10-CM | POA: Diagnosis not present

## 2016-04-05 DIAGNOSIS — Z01419 Encounter for gynecological examination (general) (routine) without abnormal findings: Secondary | ICD-10-CM | POA: Diagnosis not present

## 2016-04-05 DIAGNOSIS — N83202 Unspecified ovarian cyst, left side: Secondary | ICD-10-CM

## 2016-04-05 LAB — HEPATITIS C ANTIBODY: HCV Ab: NEGATIVE

## 2016-04-05 NOTE — Progress Notes (Signed)
Patient ID: [name], female   DOB: 03-25-1946, 70 y.o.   MRN: [id number]  70 y.o. [id number] Married  Caucasian Fe here for annual exam.  New diagnosis of fibromyalgia and polymyalgia rheumatica in 09/2015.  About 30 yrs ago was diagnosed with fibromyalgia.  Currently on Prednisone since December, but now tapering.  Patient's last menstrual period was 09/13/1984 (approximate).          Sexually active: No.  The current method of family planning is post menopausal status.    Exercising: Yes.    water exercise Smoker:  no  Health Maintenance: OZH:YQMV, TVH MMG:02/07/15, Bi-Rads 1: Negative will schedule Colonoscopy:01/25/13, Tubular Adenoma, repeat in 3 years will schedule BMD: had a repeat at [facility] in January TDaP: Updated within 3 years Shingles: Never - pt declines  Prevnar: done 2015, 2016 Hep C: done today Labs: PCP and Rheumatology takes care of all labs   reports that she has never smoked. She has never used smokeless tobacco. She reports that she does not drink alcohol or use drugs.  Past Medical History:  Diagnosis Date  . Bilateral ovarian cysts 09/12/14   bilateral simple ovarian cysts  . 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 Fibromyalgia   . HTN (hypertension)    under control with meds., has been on med. x 20 yr.  . Hyperlipidemia   . Hypothyroid   . IBS (irritable bowel syndrome)   . Impingement syndrome of right shoulder 07/2013  . PONV (postoperative nausea and vomiting)   . Rupture of right rotator cuff 07/2013  . Vitamin B12 deficiency   . Wears partial dentures    lower    Past Surgical History:  Procedure Laterality Date  . ANTERIOR LAT LUMBAR FUSION  10/08/2008   L3-4  . BACK SURGERY    . BACK SURGERY  10-03-14   --Dr. Trey Sailors at [facility]  . COLONOSCOPY W/ BIOPSIES  11/2009   polyps  . COLONOSCOPY W/ BIOPSIES  11/2012   polyp, recheck in 3 years  . CYSTO  07/05/2006  . CYSTOCELE REPAIR  07/05/2006   . ESOPHAGOGASTRODUODENOSCOPY ENDOSCOPY  05/25/05   erythema and edema of gastric lumen  . FOOT SURGERY Right   . GANGLION CYST EXCISION Right    wrist  . INCONTINENCE SURGERY  07/05/2006   cystourethropexy  . ORIF FOREARM FRACTURE Right 09/2012  . SHOULDER ARTHROSCOPY WITH ROTATOR CUFF REPAIR AND SUBACROMIAL DECOMPRESSION Right 08/02/2013   Procedure: SHOULDER ARTHROSCOPY WITH ROTATOR CUFF REPAIR AND SUBACROMIAL DECOMPRESSION, EXTENSIVE DEBRIDEMENT (INCLUDING LABRUM), PARTIAL ACROMIPLASY WITH CORACOACROMIAL RELEASE, DISTAL CLAVICULECTOMY;  Surgeon: Loreta Ave, MD;  Location: [facility];  Service: Orthopedics;  Laterality: Right;  . STRABISMUS SURGERY Right 01/26/2008   2 other surgeries previously  . Thumb surgery  2/13 & ?2010   Bilateral - states a joint was replaced  . TOTAL VAGINAL HYSTERECTOMY  1986 - or age 17   secondary AUB    Current Outpatient Prescriptions  Medication Sig Dispense Refill  . benazepril (LOTENSIN) 20 MG tablet Take 20 mg by mouth daily.    . Calcium Carb-Cholecalciferol (CALCIUM 1000 + D PO) Take 1 tablet by mouth daily.    . cholecalciferol (VITAMIN D) 1000 UNITS tablet Take 1,000 Units by mouth every other day.     . Cyanocobalamin 5000 MCG CAPS Take 1 capsule by mouth every other day.    . levothyroxine (SYNTHROID, LEVOTHROID) 88 MCG tablet   5  . Multiple Vitamin (  MULTIVITAMIN) tablet Take 1 tablet by mouth daily.    . Omega-3 Fatty Acids (FISH OIL) 1000 MG CAPS Take 1 capsule by mouth daily.    . predniSONE (DELTASONE) 1 MG tablet Take 3 tablets by mouth daily.    . predniSONE (DELTASONE) 5 MG tablet Take 1 tablet by mouth daily.    . traZODone (DESYREL) 50 MG tablet Take 1 tablet by mouth at bedtime.     No current facility-administered medications for this visit.     Family History  Problem Relation Age of Onset  . Aortic aneurysm Father   . Hypertension Father   . Coronary artery disease Mother 65  . Hypertension Mother   .  Hyperlipidemia Brother     brain tumor unknown if malignant    ROS:  Pertinent items are noted in HPI.  Otherwise, a comprehensive ROS was negative.  Exam:   BP 122/80 (BP Location: Right Arm, Patient Position: Sitting, Cuff Size: Normal)   Pulse 68   Ht 5\' 8"  (1.727 m)   Wt 192 lb (87.1 kg)   LMP 09/13/1984 (Approximate)   BMI 29.19 kg/m  Height: 5\' 8"  (172.7 cm) Ht Readings from Last 3 Encounters:  04/05/16 5\' 8"  (1.727 m)  06/02/15 5\' 8"  (1.727 m)  03/13/15 5\' 8"  (1.727 m)    General appearance: alert, cooperative and appears stated age Head: Normocephalic, without obvious abnormality, atraumatic Neck: no adenopathy, supple, symmetrical, trachea midline and thyroid normal to inspection and palpation Lungs: clear to auscultation bilaterally Breasts: normal appearance, no masses or tenderness Heart: regular rate and rhythm Abdomen: soft, non-tender; no masses,  no organomegaly Extremities: extremities normal, atraumatic, no cyanosis or edema Skin: Skin color, texture, turgor normal. No rashes or lesions Lymph nodes: Cervical, supraclavicular, and axillary nodes normal. No abnormal inguinal nodes palpated Neurologic: Grossly normal   Pelvic: External genitalia:  Lesions X 3 that is at 6:00 position that looks like LSA, other hypopigmented ares at the introitus might be just from atrophy.              Urethra:  normal appearing urethra with no masses, tenderness or lesions              Bartholin's and Skene's: normal                 Vagina: normal appearing vagina with normal color and discharge, no lesions              Cervix: absent              Pap taken: No. Bimanual Exam:  Uterus:  uterus absent              Adnexa: no mass, fullness, tenderness               Rectovaginal: Confirms               Anus:  normal sphincter tone, no lesions  Chaperone present: no  A:  Well Woman with normal exam             S/P TVH secondary to AUB age 83  On ERT until about age  49 S/P cystocele repair 10/07  History of bilateral OV cyst with normal CA 125 - due again 05/2016 History of arthritis, fibromyalgia, polymyalgia rheumatica - on steroids  ? LSA   P:   Reviewed health and wellness pertinent to exam  Pap smear not indicated  Will get vulvar biopsy scheduled to R/O LSA -  per Dr. Edward Jolly OK to da the same day of PUS  Will get PUS repeat for 05/2016  Mammogram is due now and will schedule  Colonoscopy is due now and will schedule  Counseled on breast self exam, mammography screening, adequate intake of calcium and vitamin D, diet and exercise, Kegel's exercises return annually or prn  An After Visit Summary was printed and given to the patient.

## 2016-04-05 NOTE — Patient Instructions (Addendum)
EXERCISE AND DIET:  We recommended that you start or continue a regular exercise program for good health. Regular exercise means any activity that makes your heart beat faster and makes you sweat.  We recommend exercising at least 30 minutes per day at least 3 days a week, preferably 4 or 5.  We also recommend a diet low in fat and sugar.  Inactivity, poor dietary choices and obesity can cause diabetes, heart attack, stroke, and kidney damage, among others.    ALCOHOL AND SMOKING:  Women should limit their alcohol intake to no more than 7 drinks/beers/glasses of wine (combined, not each!) per week. Moderation of alcohol intake to this level decreases your risk of breast cancer and liver damage. And of course, no recreational drugs are part of a healthy lifestyle.  And absolutely no smoking or even second hand smoke. Most people know smoking can cause heart and lung diseases, but did you know it also contributes to weakening of your bones? Aging of your skin?  Yellowing of your teeth and nails?  CALCIUM AND VITAMIN D:  Adequate intake of calcium and Vitamin D are recommended.  The recommendations for exact amounts of these supplements seem to change often, but generally speaking 600 mg of calcium (either carbonate or citrate) and 800 units of Vitamin D per day seems prudent. Certain women may benefit from higher intake of Vitamin D.  If you are among these women, your doctor will have told you during your visit.    PAP SMEARS:  Pap smears, to check for cervical cancer or precancers,  have traditionally been done yearly, although recent scientific advances have shown that most women can have pap smears less often.  However, every woman still should have a physical exam from her gynecologist every year. It will include a breast check, inspection of the vulva and vagina to check for abnormal growths or skin changes, a visual exam of the cervix, and then an exam to evaluate the size and shape of the uterus and  ovaries.  And after 68 years of age, a rectal exam is indicated to check for rectal cancers. We will also provide age appropriate advice regarding health maintenance, like when you should have certain vaccines, screening for sexually transmitted diseases, bone density testing, colonoscopy, mammograms, etc.   MAMMOGRAMS:  All women over 40 years old should have a yearly mammogram. Many facilities now offer a "3D" mammogram, which may cost around $50 extra out of pocket. If possible,  we recommend you accept the option to have the 3D mammogram performed.  It both reduces the number of women who will be called back for extra views which then turn out to be normal, and it is better than the routine mammogram at detecting truly abnormal areas.    COLONOSCOPY:  Colonoscopy to screen for colon cancer is recommended for all women at age 50.  We know, you hate the idea of the prep.  We agree, BUT, having colon cancer and not knowing it is worse!!  Colon cancer so often starts as a polyp that can be seen and removed at colonscopy, which can quite literally save your life!  And if your first colonoscopy is normal and you have no family history of colon cancer, most women don't have to have it again for 10 years.  Once every ten years, you can do something that may end up saving your life, right?  We will be happy to help you get it scheduled when you are ready.    Be sure to check your insurance coverage so you understand how much it will cost.  It may be covered as a preventative service at no cost, but you should check your particular policy.     Lichen Sclerosus Lichen sclerosus is a skin problem. It can happen on any part of the body, but it commonly involves the anal or genital areas. It can cause itching and discomfort in these areas. Treatment can help to control symptoms. When the genital area is affected, getting treatment is important because the condition can cause scarring that may lead to other  problems. CAUSES The cause of this condition is not known. It could be the result of an overactive immune system or a lack of certain hormones. Lichen sclerosus is not an infection or a fungus. It is not passed from one person to another (not contagious). RISK FACTORS This condition is more likely to develop in women, usually after menopause. SYMPTOMS Symptoms of this condition include:  Thin, wrinkled, white areas on the skin.  Thickened white areas on the skin.  Red and swollen patches (lesions) on the skin.  Tears or cracks in the skin.  Bruising.  Blood blisters.  Severe itching. You may also have pain, itching, or burning with urination. Constipation is also common in people with lichen sclerosus. DIAGNOSIS This condition may be diagnosed with a physical exam. In some cases, a tissue sample (biopsy sample) may be removed to be looked at under a microscope. TREATMENT This condition is usually treated with medicated creams or ointments (topical steroids) that are applied over the affected areas. HOME CARE INSTRUCTIONS  Take over-the-counter and prescription medicines only as told by your health care provider.  Use creams or ointments as told by your health care provider.  Do not scratch the affected areas of skin.  Women should keep the vaginal area as clean and dry as possible.  Keep all follow-up visits as told by your health care provider. This is important. SEEK MEDICAL CARE IF:  You have increasing redness, swelling, or pain in the affected area.  You have fluid, blood, or pus coming from the affected area.  You have new lesions on your skin.  You have pain or burning with urination.  You have pain during sex.   This information is not intended to replace advice given to you by your health care provider. Make sure you discuss any questions you have with your health care provider.   Document Released: 01/20/2011 Document Revised: 05/21/2015 Document Reviewed:  11/25/2014 Elsevier Interactive Patient Education 2016 [employer]  

## 2016-04-07 NOTE — Progress Notes (Signed)
Reviewed personally.  M. Suzanne Swati Granberry, MD.  

## 2016-05-20 ENCOUNTER — Telehealth: Payer: Self-pay | Admitting: Obstetrics and Gynecology

## 2016-05-20 ENCOUNTER — Other Ambulatory Visit: Payer: Self-pay | Admitting: *Deleted

## 2016-05-20 ENCOUNTER — Ambulatory Visit (INDEPENDENT_AMBULATORY_CARE_PROVIDER_SITE_OTHER): Payer: Medicare Other

## 2016-05-20 ENCOUNTER — Ambulatory Visit (INDEPENDENT_AMBULATORY_CARE_PROVIDER_SITE_OTHER): Payer: Medicare Other | Admitting: Obstetrics and Gynecology

## 2016-05-20 ENCOUNTER — Encounter: Payer: Self-pay | Admitting: Obstetrics and Gynecology

## 2016-05-20 VITALS — BP 112/72 | HR 68 | Resp 16 | Ht 68.0 in | Wt 192.0 lb

## 2016-05-20 DIAGNOSIS — N83202 Unspecified ovarian cyst, left side: Principal | ICD-10-CM

## 2016-05-20 DIAGNOSIS — N83201 Unspecified ovarian cyst, right side: Secondary | ICD-10-CM | POA: Diagnosis not present

## 2016-05-20 DIAGNOSIS — N9089 Other specified noninflammatory disorders of vulva and perineum: Secondary | ICD-10-CM

## 2016-05-20 NOTE — Progress Notes (Signed)
Patient ID: [name], female   DOB: 1946-01-06, 70 y.o.   MRN: [id number] GYNECOLOGY  VISIT   HPI: 70 y.o.   Married  Caucasian  female   [phone] with Patient's last menstrual period was 09/13/1984 (approximate).   here for pelvic ultrasound for history bilateral ovarian cysts with normal CA 125.   Patient also here for vulvar biopsy today to r/o Lichen Sclerosus.  Noted to have an area of 3 lesions at 6:00 by Shirlyn Goltz at her routine visit.  Patient notes itching.   GYNECOLOGIC HISTORY: Patient's last menstrual period was 09/13/1984 (approximate). Contraception:  Hysterectomy Menopausal hormone therapy:  n/a Last mammogram:  02-07-15 Density B/Neg/Birads1:[facility] Last pap smear:   None--Hyst age 15        OB History    Gravida Para Term Preterm AB Living   3 3 3  0 0 3   SAB TAB Ectopic Multiple Live Births   0 0 0 0 3         Patient Active Problem List   Diagnosis Date Noted  . Left ovarian cyst 06/02/2015  . Degenerative joint disease, shoulder, right 08/02/2013  . Precordial pain 02/11/2012  . HTN (hypertension) 02/11/2012  . Hyperlipidemia 02/11/2012    Past Medical History:  Diagnosis Date  . Bilateral ovarian cysts 09/12/14   bilateral simple ovarian cysts  . 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 Fibromyalgia   . HTN (hypertension)    under control with meds., has been on med. x 20 yr.  . Hyperlipidemia   . Hypothyroid   . IBS (irritable bowel syndrome)   . Impingement syndrome of right shoulder 07/2013  . PONV (postoperative nausea and vomiting)   . Rupture of right rotator cuff 07/2013  . Vitamin B12 deficiency   . Wears partial dentures    lower    Past Surgical History:  Procedure Laterality Date  . ANTERIOR LAT LUMBAR FUSION  10/08/2008   L3-4  . BACK SURGERY    . BACK SURGERY  10-03-14   --Dr. Trey Sailors at [facility]  . COLONOSCOPY W/ BIOPSIES  11/2009   polyps  . COLONOSCOPY W/ BIOPSIES  11/2012   polyp, recheck in 3 years  . CYSTO  07/05/2006  . CYSTOCELE REPAIR  07/05/2006  . ESOPHAGOGASTRODUODENOSCOPY ENDOSCOPY  05/25/05   erythema and edema of gastric lumen  . FOOT SURGERY Right   . GANGLION CYST EXCISION Right    wrist  . INCONTINENCE SURGERY  07/05/2006   cystourethropexy  . ORIF FOREARM FRACTURE Right 09/2012  . SHOULDER ARTHROSCOPY WITH ROTATOR CUFF REPAIR AND SUBACROMIAL DECOMPRESSION Right 08/02/2013   Procedure: SHOULDER ARTHROSCOPY WITH ROTATOR CUFF REPAIR AND SUBACROMIAL DECOMPRESSION, EXTENSIVE DEBRIDEMENT (INCLUDING LABRUM), PARTIAL ACROMIPLASY WITH CORACOACROMIAL RELEASE, DISTAL CLAVICULECTOMY;  Surgeon: Loreta Ave, MD;  Location: [facility];  Service: Orthopedics;  Laterality: Right;  . STRABISMUS SURGERY Right 01/26/2008   2 other surgeries previously  . Thumb surgery  2/13 & ?2010   Bilateral - states a joint was replaced  . TOTAL VAGINAL HYSTERECTOMY  1986 - or age 68   secondary AUB    Current Outpatient Prescriptions  Medication Sig Dispense Refill  . benazepril (LOTENSIN) 20 MG tablet Take 20 mg by mouth daily.    . Calcium Carb-Cholecalciferol (CALCIUM 1000 + D PO) Take 1 tablet by mouth daily.    . cholecalciferol (VITAMIN D) 1000 UNITS tablet Take 1,000 Units by mouth every other day.     Marland Kitchen  Cyanocobalamin 5000 MCG CAPS Take 1 capsule by mouth every other day.    . levothyroxine (SYNTHROID, LEVOTHROID) 88 MCG tablet   5  . Multiple Vitamin (MULTIVITAMIN) tablet Take 1 tablet by mouth daily.    . Omega-3 Fatty Acids (FISH OIL) 1000 MG CAPS Take 1 capsule by mouth daily.    . predniSONE (DELTASONE) 1 MG tablet Take 2 tablets by mouth daily.     . predniSONE (DELTASONE) 5 MG tablet Take 1 tablet by mouth daily.    . traZODone (DESYREL) 50 MG tablet Take 1 tablet by mouth at bedtime.     No current facility-administered medications for this visit.      ALLERGIES: Penicillins and Sulfa antibiotics  Family History  Problem Relation  Age of Onset  . Aortic aneurysm Father   . Hypertension Father   . Coronary artery disease Mother 570  . Hypertension Mother   . Hyperlipidemia Brother     brain tumor unknown if malignant    Social History   Social History  . Marital status: Married    Spouse name: N/A  . Number of children: 3  . Years of education: N/A   Occupational History  .  Unemployed   Social History Main Topics  . Smoking status: Never Smoker  . Smokeless tobacco: Never Used  . Alcohol use No  . Drug use: No  . Sexual activity: Not Currently    Partners: Male    Birth control/ protection: Surgical     Comment: Hyst   Other Topics Concern  . Not on file   Social History Narrative   Lives at home with husband and granddaughter.      ROS:  Pertinent items are noted in HPI.  PHYSICAL EXAMINATION:    BP 112/72 (BP Location: Right Arm, Patient Position: Sitting, Cuff Size: Normal)   Pulse 68   Resp 16   Ht 5\' 8"  (1.727 m)   Wt 192 lb (87.1 kg)   LMP 09/13/1984 (Approximate)   BMI 29.19 kg/m     General appearance: alert, cooperative and appears stated age    Pelvic: External genitalia:   Perineum with 2 lesions at 6:00.  Right perineum with 7 mm fungating, raised, white nontender lesion.  Left perineum with 3 mm fungating, raised, white nontender lesion.   Vulvar biopsy.  Consent for procedure.  Sterile prep with betadine.  Local 1% lidocaine - lot [id number], exp 11/11/16. 4 mm punch of right perineal lesion to pathology.  4 mm punch of left perineal lesion to pathology.  These are adjacent to each other.  2 sutures of 3/0 vicryl to area.  Good hemostasis. No complications.  [facility]  If there is dysplasia present, patient will need further excision for its removal.  There is still tissue remaining.                 Chaperone was present for exam.  ASSESSMENT  Vulvar lesions.  Condyloma verus dysplasia.   PLAN  Follow up biopsies.  Instructions and precautions given.   Follow up in 8 days.    An After Visit Summary was printed and given to the patient.  ___15___ minutes face to face time of which over 50% was spent in counseling.

## 2016-05-20 NOTE — Telephone Encounter (Signed)
Message left to return call to [name] at [phone].    

## 2016-05-20 NOTE — Telephone Encounter (Signed)
Patient needing an appointment for 8 day reck with dr Edward JollySilva. Appointments offered would not work for patient.

## 2016-05-20 NOTE — Telephone Encounter (Signed)
Patient returned call. Patient states that the only day she really has available to come in to the office is on a Thursday. Office visit for recheck scheduled for Thursday 05/27/16 at 1330. Patient agreeable to date and time and aware to arrive 15 minutes early.   Routing to provider for final review. Patient agreeable to disposition. Will close encounter.

## 2016-05-20 NOTE — Progress Notes (Signed)
      Status post TVH.  Last pelvic ultrasound 12/12/14: Uterus absent.  Right ovary - 14 mm simple cyst. Left ovary - 24 mm simple cyst. No free fluid.  CA125 - 7 on 09/12/14.   Pelvic ultrasound 06/02/15: Uterus - absent EMS - NA Ovaries - simple left ovarian cyst 2.23 cm.  No abnormal flow, echofree. Free fluid - no  Ultrasound today:  Uterus absent.  Right ovary - 13 mm follicular type cyst.  Left ovary - 23 mm thin walled cyst.  No free fluid.   Assessment  Stable bilateral simple ovarian cysts.   Plan  Signs and symptoms of enlarging cysts/ovarian cancer reviewed.  Follow up ultrasound in one year.  CA125 not ordered today due to stability of ovarian cysts.   After visit summary to patient.

## 2016-05-27 ENCOUNTER — Ambulatory Visit (INDEPENDENT_AMBULATORY_CARE_PROVIDER_SITE_OTHER): Payer: Medicare Other | Admitting: Obstetrics and Gynecology

## 2016-05-27 ENCOUNTER — Encounter: Payer: Self-pay | Admitting: Obstetrics and Gynecology

## 2016-05-27 VITALS — BP 118/70 | HR 76 | Resp 16 | Ht 68.0 in | Wt 191.0 lb

## 2016-05-27 DIAGNOSIS — Z1272 Encounter for screening for malignant neoplasm of vagina: Secondary | ICD-10-CM | POA: Diagnosis not present

## 2016-05-27 DIAGNOSIS — A630 Anogenital (venereal) warts: Secondary | ICD-10-CM | POA: Diagnosis not present

## 2016-05-27 NOTE — Progress Notes (Signed)
GYNECOLOGY  VISIT   HPI: 70 y.o.   Married  Caucasian  female   [phone] with Patient's last menstrual period was 09/13/1984 (approximate).   here for recheck.   Status post perineal biopsies.   No prior dx of condyloma or abnormal pap.  Not sexually active.   Hysterectomy done for menorrhagia and anemia.  GYNECOLOGIC HISTORY: Patient's last menstrual period was 09/13/1984 (approximate). Contraception:  Hysterectomy Menopausal hormone therapy:  none Last mammogram:  02/07/15 Density B/Neg/Birads1:[facility] Last pap smear:   None--Hysterectomy age 70        OB History    Gravida Para Term Preterm AB Living   3 3 3  0 0 3   SAB TAB Ectopic Multiple Live Births   0 0 0 0 3         Patient Active Problem List   Diagnosis Date Noted  . Left ovarian cyst 06/02/2015  . Degenerative joint disease, shoulder, right 08/02/2013  . Precordial pain 02/11/2012  . HTN (hypertension) 02/11/2012  . Hyperlipidemia 02/11/2012    Past Medical History:  Diagnosis Date  . Bilateral ovarian cysts 09/12/14   bilateral simple ovarian cysts  . 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 Fibromyalgia   . HTN (hypertension)    under control with meds., has been on med. x 20 yr.  . Hyperlipidemia   . Hypothyroid   . IBS (irritable bowel syndrome)   . Impingement syndrome of right shoulder 07/2013  . PONV (postoperative nausea and vomiting)   . Rupture of right rotator cuff 07/2013  . Vitamin B12 deficiency   . Wears partial dentures    lower    Past Surgical History:  Procedure Laterality Date  . ANTERIOR LAT LUMBAR FUSION  10/08/2008   L3-4  . BACK SURGERY    . BACK SURGERY  10-03-14   --[facility]  . COLONOSCOPY W/ BIOPSIES  11/2009   polyps  . COLONOSCOPY W/ BIOPSIES  11/2012   polyp, recheck in 3 years  . CYSTO  07/05/2006  . CYSTOCELE REPAIR  07/05/2006  . ESOPHAGOGASTRODUODENOSCOPY ENDOSCOPY  05/25/05   erythema and edema of  gastric lumen  . FOOT SURGERY Right   . GANGLION CYST EXCISION Right    wrist  . INCONTINENCE SURGERY  07/05/2006   cystourethropexy  . ORIF FOREARM FRACTURE Right 09/2012  . SHOULDER ARTHROSCOPY WITH ROTATOR CUFF REPAIR AND SUBACROMIAL DECOMPRESSION Right 08/02/2013   Procedure: SHOULDER ARTHROSCOPY WITH ROTATOR CUFF REPAIR AND SUBACROMIAL DECOMPRESSION, EXTENSIVE DEBRIDEMENT (INCLUDING LABRUM), PARTIAL ACROMIPLASY WITH CORACOACROMIAL RELEASE, DISTAL CLAVICULECTOMY;  Surgeon: Loreta Aveaniel F Murphy, MD;  Location: [facility];  Service: Orthopedics;  Laterality: Right;  . STRABISMUS SURGERY Right 01/26/2008   2 other surgeries previously  . Thumb surgery  2/13 & ?2010   Bilateral - states a joint was replaced  . TOTAL VAGINAL HYSTERECTOMY  1986 - or age 70   secondary AUB    Current Outpatient Prescriptions  Medication Sig Dispense Refill  . benazepril (LOTENSIN) 20 MG tablet Take 20 mg by mouth daily.    . Calcium Carb-Cholecalciferol (CALCIUM 1000 + D PO) Take 1 tablet by mouth daily.    . cholecalciferol (VITAMIN D) 1000 UNITS tablet Take 1,000 Units by mouth every other day.     . Cyanocobalamin 5000 MCG CAPS Take 1 capsule by mouth every other day.    . levothyroxine (SYNTHROID, LEVOTHROID) 88 MCG tablet   5  . Multiple Vitamin (MULTIVITAMIN)  tablet Take 1 tablet by mouth daily.    . Omega-3 Fatty Acids (FISH OIL) 1000 MG CAPS Take 1 capsule by mouth daily.    . predniSONE (DELTASONE) 1 MG tablet Take 2 tablets by mouth daily.     . predniSONE (DELTASONE) 5 MG tablet Take 1 tablet by mouth daily.    . traZODone (DESYREL) 50 MG tablet Take 1 tablet by mouth at bedtime.     No current facility-administered medications for this visit.      ALLERGIES: Penicillins and Sulfa antibiotics  Family History  Problem Relation Age of Onset  . Aortic aneurysm Father   . Hypertension Father   . Coronary artery disease Mother 63  . Hypertension Mother   . Hyperlipidemia Brother      brain tumor unknown if malignant    Social History   Social History  . Marital status: Married    Spouse name: N/A  . Number of children: 3  . Years of education: N/A   Occupational History  .  Unemployed   Social History Main Topics  . Smoking status: Never Smoker  . Smokeless tobacco: Never Used  . Alcohol use No  . Drug use: No  . Sexual activity: Not Currently    Partners: Male    Birth control/ protection: Surgical     Comment: Hyst   Other Topics Concern  . Not on file   Social History Narrative   Lives at home with husband and granddaughter.      ROS:  Pertinent items are noted in HPI.  PHYSICAL EXAMINATION:    BP 118/70 (BP Location: Right Arm, Patient Position: Sitting, Cuff Size: Normal)   Pulse 76   Resp 16   Ht 5\' 8"  (1.727 m)   Wt 191 lb (86.6 kg)   LMP 09/13/1984 (Approximate)   BMI 29.04 kg/m     General appearance: alert, cooperative and appears stated age   Pelvic: External genitalia:  Sutures present on the perineum. Removed.  Small amount of condyloma present at top of perineum above the upper suture site.  Treated with 80% TCA.               Urethra:  normal appearing urethra with no masses, tenderness or lesions              Bartholins and Skenes: normal                 Vagina: normal appearing vagina with normal color and discharge, no lesions              Cervix:  Absent.                Bimanual Exam:  Uterus:  normal size, contour, position, consistency, mobility, non-tender              Adnexa: no mass, fullness, tenderness    Chaperone was present for exam.  ASSESSMENT  Vulval condyloma.    PLAN  Discussion of vulvar condyloma and HPV as the causative agent.  Signs and symptoms of vulvar condyloma reviewed - pruritis, raised irregular lesions. Pap done.  Follow up for yearly exams and prn.    An After Visit Summary was printed and given to the patient.  __15____ minutes face to face time of which over 50% was spent in  counseling.

## 2016-05-31 LAB — IPS PAP SMEAR ONLY

## 2016-06-01 ENCOUNTER — Telehealth: Payer: Self-pay

## 2016-06-01 NOTE — Telephone Encounter (Signed)
-----   Message from Patton SallesBrook E Amundson C Silva, MD sent at 05/31/2016  2:27 PM EDT ----- Please inform patient of inadequate specimen for the pap smear.  There were too few cells obtained.  Please have her return for a pap only.   Cc- Claudette LawsAmanda Dixon

## 2016-06-01 NOTE — Telephone Encounter (Signed)
Spoke with patient. Advised of results and message as seen below from Dr.Silva. Patient is agreeable and verbalizes understanding. Patient is requesting an appointment early Thursday morning as she will be going out of town and be gone for over a week. Appointment scheduled for 06/03/2016 at 8:45 am with Dr.Silva. Patient is agreeable to date and time.  Routing to provider for final review. Patient agreeable to disposition. Will close encounter.

## 2016-06-03 ENCOUNTER — Encounter: Payer: Self-pay | Admitting: Obstetrics and Gynecology

## 2016-06-03 ENCOUNTER — Ambulatory Visit (INDEPENDENT_AMBULATORY_CARE_PROVIDER_SITE_OTHER): Payer: Medicare Other | Admitting: Obstetrics and Gynecology

## 2016-06-03 VITALS — BP 120/72 | HR 64 | Ht 68.0 in | Wt 191.0 lb

## 2016-06-03 DIAGNOSIS — N83202 Unspecified ovarian cyst, left side: Secondary | ICD-10-CM

## 2016-06-03 DIAGNOSIS — R87610 Atypical squamous cells of undetermined significance on cytologic smear of cervix (ASC-US): Secondary | ICD-10-CM

## 2016-06-03 DIAGNOSIS — Z1272 Encounter for screening for malignant neoplasm of vagina: Secondary | ICD-10-CM | POA: Diagnosis not present

## 2016-06-03 DIAGNOSIS — N9089 Other specified noninflammatory disorders of vulva and perineum: Secondary | ICD-10-CM

## 2016-06-03 NOTE — Patient Instructions (Signed)

## 2016-06-03 NOTE — Progress Notes (Signed)
GYNECOLOGY  VISIT   HPI: 70 y.o.   Married  Caucasian  female   [phone] with Patient's last menstrual period was 09/13/1984 (approximate).   here for pap smear.    Pap showed insufficient cells.  Pap was done due to recent dx of condyloma.  Hysterectomy for anemia due to heavy menses.   Had recent perineal biopsies showing condyloma. Had residual condyloma treated with TCA at last visit.   GYNECOLOGIC HISTORY: Patient's last menstrual period was 09/13/1984 (approximate). Contraception:  Hysterectomy Menopausal hormone therapy:  none Last mammogram:  02/07/15 Density B[facility]Neg/Birads1:[facility]  Last pap smear:  None since Hysterectomy age 70        OB History    Gravida Para Term Preterm AB Living   3 3 3  0 0 3   SAB TAB Ectopic Multiple Live Births   0 0 0 0 3         Patient Active Problem List   Diagnosis Date Noted  . Left ovarian cyst 06/02/2015  . Degenerative joint disease, shoulder, right 08/02/2013  . Precordial pain 02/11/2012  . HTN (hypertension) 02/11/2012  . Hyperlipidemia 02/11/2012    Past Medical History:  Diagnosis Date  . Bilateral ovarian cysts 09/12/14   bilateral simple ovarian cysts  . 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 Fibromyalgia   . HTN (hypertension)    under control with meds., has been on med. x 20 yr.  . Hyperlipidemia   . Hypothyroid   . IBS (irritable bowel syndrome)   . Impingement syndrome of right shoulder 07/2013  . PONV (postoperative nausea and vomiting)   . Rupture of right rotator cuff 07/2013  . Vitamin B12 deficiency   . Wears partial dentures    lower    Past Surgical History:  Procedure Laterality Date  . ANTERIOR LAT LUMBAR FUSION  10/08/2008   L3-4  . BACK SURGERY    . BACK SURGERY  10-03-14   --[facility]  . COLONOSCOPY W/ BIOPSIES  11/2009   polyps  . COLONOSCOPY W/ BIOPSIES  11/2012   polyp, recheck in 3 years  . CYSTO  07/05/2006  . CYSTOCELE REPAIR   07/05/2006  . ESOPHAGOGASTRODUODENOSCOPY ENDOSCOPY  05/25/05   erythema and edema of gastric lumen  . FOOT SURGERY Right   . GANGLION CYST EXCISION Right    wrist  . INCONTINENCE SURGERY  07/05/2006   cystourethropexy  . ORIF FOREARM FRACTURE Right 09/2012  . SHOULDER ARTHROSCOPY WITH ROTATOR CUFF REPAIR AND SUBACROMIAL DECOMPRESSION Right 08/02/2013   Procedure: SHOULDER ARTHROSCOPY WITH ROTATOR CUFF REPAIR AND SUBACROMIAL DECOMPRESSION, EXTENSIVE DEBRIDEMENT (INCLUDING LABRUM), PARTIAL ACROMIPLASY WITH CORACOACROMIAL RELEASE, DISTAL CLAVICULECTOMY;  Surgeon: Loreta Aveaniel F Murphy, MD;  Location: [facility];  Service: Orthopedics;  Laterality: Right;  . STRABISMUS SURGERY Right 01/26/2008   2 other surgeries previously  . Thumb surgery  2/13 & ?2010   Bilateral - states a joint was replaced  . TOTAL VAGINAL HYSTERECTOMY  1986 - or age 70   secondary AUB    Current Outpatient Prescriptions  Medication Sig Dispense Refill  . benazepril (LOTENSIN) 20 MG tablet Take 20 mg by mouth daily.    . Calcium Carb-Cholecalciferol (CALCIUM 1000 + D PO) Take 1 tablet by mouth daily.    . cholecalciferol (VITAMIN D) 1000 UNITS tablet Take 1,000 Units by mouth every other day.     . Cyanocobalamin 5000 MCG CAPS Take 1 capsule by mouth every other day.    .Marland Kitchen  levothyroxine (SYNTHROID, LEVOTHROID) 88 MCG tablet   5  . Multiple Vitamin (MULTIVITAMIN) tablet Take 1 tablet by mouth daily.    . Omega-3 Fatty Acids (FISH OIL) 1000 MG CAPS Take 1 capsule by mouth daily.    . predniSONE (DELTASONE) 1 MG tablet Take 2 tablets by mouth daily.     . predniSONE (DELTASONE) 5 MG tablet Take 1 tablet by mouth daily.    . traZODone (DESYREL) 50 MG tablet Take 1 tablet by mouth at bedtime.     No current facility-administered medications for this visit.      ALLERGIES: Penicillins and Sulfa antibiotics  Family History  Problem Relation Age of Onset  . Aortic aneurysm Father   . Hypertension Father   .  Coronary artery disease Mother 83  . Hypertension Mother   . Hyperlipidemia Brother     brain tumor unknown if malignant    Social History   Social History  . Marital status: Married    Spouse name: N/A  . Number of children: 3  . Years of education: N/A   Occupational History  .  Unemployed   Social History Main Topics  . Smoking status: Never Smoker  . Smokeless tobacco: Never Used  . Alcohol use No  . Drug use: No  . Sexual activity: Not Currently    Partners: Male    Birth control/ protection: Surgical     Comment: Hyst   Other Topics Concern  . Not on file   Social History Narrative   Lives at home with husband and granddaughter.      ROS:  Pertinent items are noted in HPI.  PHYSICAL EXAMINATION:    BP 120/72 (BP Location: Right Arm, Patient Position: Sitting, Cuff Size: Normal)   Pulse 64   Ht 5\' 8"  (1.727 m)   Wt 191 lb (86.6 kg)   LMP 09/13/1984 (Approximate)   BMI 29.04 kg/m     General appearance: alert, cooperative and appears stated age   Pelvic: External genitalia:  4 mm condyloma of the left inferior junction of labia with perineum.              Urethra:  normal appearing urethra with no masses, tenderness or lesions              Bartholins and Skenes: normal                 Vagina: normal appearing vagina with normal color and discharge, no lesions              Cervix:  Absent.               Pap taken of vaginal cuff.            Vulvar biopsy Consent for procedure.  Sterile prep with betadine.  Local 1% lidocaine - lot [id number], exp. 2/21. Condyloma removed with small fine scissors.  To pathology. Suture of 3/0 vicryl placed. Area of prior perineal biopsies opened with the pressure from doing the new biopsy today.  Area sterilized and injected with more local 1% lidocaine.  2 sutures of 3/0 vicryl placed. Minimal EBL.  No complications.  Chaperone was present for exam.  ASSESSMENT  Insufficient cells on pap.  Left vulva lesion. More  condylomatous tissue.  PLAN  Follow up pap and vulvar biopsy results. Will let sutures dissolve spontaneously.  Follow up for annual exams and prn.    An After Visit Summary was printed and given to the patient.  __15____ minutes face to face time of which over 50% was spent in counseling.

## 2016-06-03 NOTE — Assessment & Plan Note (Signed)
Do yearly ultrasound.

## 2016-06-07 LAB — IPS PAP SMEAR ONLY

## 2016-06-07 LAB — IPS OTHER TISSUE BIOPSY

## 2016-06-09 NOTE — Addendum Note (Signed)
Addended by: Ardell IsaacsAMUNDSON C SILVA, BROOK E on: 06/09/2016 11:41 AM   Modules accepted: Orders

## 2016-06-11 ENCOUNTER — Encounter: Payer: Self-pay | Admitting: Obstetrics and Gynecology

## 2016-06-11 ENCOUNTER — Telehealth: Payer: Self-pay

## 2016-06-11 LAB — IPS HPV ON A LIQUID BASED SPECIMEN

## 2016-06-11 NOTE — Telephone Encounter (Signed)
-----   Message from Patton SallesBrook E Amundson C Silva, MD sent at 06/11/2016  3:30 PM EDT ----- Please inform patient of her pending test results:  - pap of the vagina showed with atypical cells of undetermined significance.  In follow up to this, high risk human papilloma virus testing was performed and was negative.  This means that her pap is considered to be normal.  Nothing further is needed.  - the final area removed from the vulva with biopsy did show some low grade dysplasia, which are abnormal cells but not cancer.  We can just follow this with exams in the office.  I believe it is all gone at this point.   Please enter recall - 02.  Cc- Patty Malachy MoodGrubb, Amanda Dixon

## 2016-06-11 NOTE — Telephone Encounter (Signed)
Left message to call Lavaeh Bau at [phone]. 

## 2016-06-14 ENCOUNTER — Ambulatory Visit (INDEPENDENT_AMBULATORY_CARE_PROVIDER_SITE_OTHER): Payer: Medicare Other | Admitting: Rheumatology

## 2016-06-14 DIAGNOSIS — G4709 Other insomnia: Secondary | ICD-10-CM

## 2016-06-14 DIAGNOSIS — M810 Age-related osteoporosis without current pathological fracture: Secondary | ICD-10-CM | POA: Diagnosis not present

## 2016-06-14 DIAGNOSIS — M797 Fibromyalgia: Secondary | ICD-10-CM | POA: Diagnosis not present

## 2016-06-14 DIAGNOSIS — M5137 Other intervertebral disc degeneration, lumbosacral region: Secondary | ICD-10-CM | POA: Diagnosis not present

## 2016-06-14 NOTE — Telephone Encounter (Signed)
Please change the recall to 08 based on the patient's vulvar dysplasia, VIN I. Thank you!  Cc- Shirlyn GoltzPatty Grubb

## 2016-06-14 NOTE — Telephone Encounter (Signed)
Recall changed to 08.  Routing to provider for final review. Encounter previously closed.

## 2016-06-14 NOTE — Telephone Encounter (Signed)
Return cal to Kaitlyn. °

## 2016-06-14 NOTE — Telephone Encounter (Signed)
Spoke with patient. Advised of results as seen below from Dr.Silva. Patient is agreeable and verbalizes understanding. 02 recall placed.  Routing to provider for final review. Patient agreeable to disposition. Will close encounter.

## 2016-10-18 ENCOUNTER — Ambulatory Visit (INDEPENDENT_AMBULATORY_CARE_PROVIDER_SITE_OTHER): Payer: Medicare Other

## 2016-10-18 ENCOUNTER — Ambulatory Visit (INDEPENDENT_AMBULATORY_CARE_PROVIDER_SITE_OTHER): Payer: Medicare Other | Admitting: Rheumatology

## 2016-10-18 ENCOUNTER — Encounter: Payer: Self-pay | Admitting: Rheumatology

## 2016-10-18 VITALS — BP 130/80 | HR 78 | Resp 14 | Ht 68.0 in | Wt 204.0 lb

## 2016-10-18 DIAGNOSIS — Z8739 Personal history of other diseases of the musculoskeletal system and connective tissue: Secondary | ICD-10-CM | POA: Diagnosis not present

## 2016-10-18 DIAGNOSIS — M353 Polymyalgia rheumatica: Secondary | ICD-10-CM

## 2016-10-18 DIAGNOSIS — G8929 Other chronic pain: Secondary | ICD-10-CM

## 2016-10-18 DIAGNOSIS — M47816 Spondylosis without myelopathy or radiculopathy, lumbar region: Secondary | ICD-10-CM

## 2016-10-18 DIAGNOSIS — M19011 Primary osteoarthritis, right shoulder: Secondary | ICD-10-CM | POA: Diagnosis not present

## 2016-10-18 DIAGNOSIS — M25562 Pain in left knee: Secondary | ICD-10-CM

## 2016-10-18 DIAGNOSIS — M25512 Pain in left shoulder: Secondary | ICD-10-CM

## 2016-10-18 DIAGNOSIS — M25561 Pain in right knee: Secondary | ICD-10-CM | POA: Diagnosis not present

## 2016-10-18 MED ORDER — COLCHICINE 0.6 MG PO TABS: 0.6000 mg | ORAL_TABLET | Freq: Every day | ORAL | 2 refills | Status: DC

## 2016-10-18 MED ORDER — LIDOCAINE HCL 1 % IJ SOLN
1.0000 mL | INTRAMUSCULAR | Status: AC | PRN
  Administered 2016-10-18: 1 mL

## 2016-10-18 MED ORDER — TRIAMCINOLONE ACETONIDE 40 MG/ML IJ SUSP
40.0000 mg | INTRAMUSCULAR | Status: AC | PRN
  Administered 2016-10-18: 40 mg via INTRA_ARTICULAR

## 2016-10-18 MED ORDER — LIDOCAINE HCL 1 % IJ SOLN
1.5000 mL | INTRAMUSCULAR | Status: AC | PRN
  Administered 2016-10-18: 1.5 mL

## 2016-10-18 NOTE — Progress Notes (Signed)
Office Visit Note  Patient: [name]             Date of Birth: 05-01-1946           MRN: [id number]             PCP: Leonard Downing, MD Referring: Leonard Downing, * Visit Date: 10/18/2016 Occupation: _0 @    Subjective:  Follow-up (stiffness ) Follow-up on PMR, fibromyalgia, left shoulder joint pain, bilateral knee joint pain  History of Present Illness: [name] is a 71 y.o. female  Last seen 06/14/2016 Patient is tapering down from prednisone for PMR. She's currently on prednisone 2 mg which she plans to use for a month and then go down to 1 mg. Currently she is having increased weakness in her shoulder girdle as well as her hip girdle.  She also reports that she has left shoulder joint pain and decreased range of motion.   Additionally, patient is having bilateral knee pain. The right knee is worse than the left knee at this time. She is requesting a cortisone injection. We also discussed Visco supplementation and patient is agreeable. She has failed and states, weight loss, we will be doing x-rays of both knees today. We have given her a cortisone injection in the right knee today.  Activities of Daily Living:  Patient reports morning stiffness for 30 minutes.   Patient Denies nocturnal pain.  Difficulty dressing/grooming: Denies Difficulty climbing stairs: Reports Difficulty getting out of chair: Reports Difficulty using hands for taps, buttons, cutlery, and/or writing: Denies   Review of Systems  Constitutional: Negative for fatigue.  HENT: Negative for mouth sores and mouth dryness.   Eyes: Negative for dryness.  Respiratory: Negative for shortness of breath.   Gastrointestinal: Negative for constipation and diarrhea.  Musculoskeletal: Negative for myalgias and myalgias.  Skin: Negative for sensitivity to sunlight.  Psychiatric/Behavioral: Negative for decreased concentration and sleep disturbance.    PMFS History:    Patient Active Problem List   Diagnosis Date Noted  . Left ovarian cyst 06/02/2015  . Degenerative joint disease, shoulder, right 08/02/2013  . Precordial pain 02/11/2012  . HTN (hypertension) 02/11/2012  . Hyperlipidemia 02/11/2012    Past Medical History:  Diagnosis Date  . Bilateral ovarian cysts 09/12/14   bilateral simple ovarian cysts  . 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 Fibromyalgia   . HTN (hypertension)    under control with meds., has been on med. x 20 yr.  . Hyperlipidemia   . Hypothyroid   . IBS (irritable bowel syndrome)   . Impingement syndrome of right shoulder 07/2013  . PONV (postoperative nausea and vomiting)   . Rupture of right rotator cuff 07/2013  . VIN I (vulvar intraepithelial neoplasia I) 2017   this is from a perineal/vulvar biopsy.  . Vitamin B12 deficiency   . Wears partial dentures    lower    Family History  Problem Relation Age of Onset  . Aortic aneurysm Father   . Hypertension Father   . Coronary artery disease Mother 19  . Hypertension Mother   . Hyperlipidemia Brother     brain tumor unknown if malignant   Past Surgical History:  Procedure Laterality Date  . ANTERIOR LAT LUMBAR FUSION  10/08/2008   L3-4  . BACK SURGERY    . BACK SURGERY  10-03-14   --Dr. Glenna Fellows at [facility]  11/2009   polyps  . COLONOSCOPY W/  BIOPSIES  11/2012   polyp, recheck in 3 years  . CYSTO  07/05/2006  . CYSTOCELE REPAIR  07/05/2006  . ESOPHAGOGASTRODUODENOSCOPY ENDOSCOPY  05/25/05   erythema and edema of gastric lumen  . FOOT SURGERY Right   . GANGLION CYST EXCISION Right    wrist  . INCONTINENCE SURGERY  07/05/2006   cystourethropexy  . ORIF FOREARM FRACTURE Right 09/2012  . SHOULDER ARTHROSCOPY WITH ROTATOR CUFF REPAIR AND SUBACROMIAL DECOMPRESSION Right 08/02/2013   Procedure: SHOULDER ARTHROSCOPY WITH ROTATOR CUFF REPAIR AND SUBACROMIAL DECOMPRESSION, EXTENSIVE DEBRIDEMENT (INCLUDING  LABRUM), PARTIAL ACROMIPLASY WITH CORACOACROMIAL RELEASE, DISTAL CLAVICULECTOMY;  Surgeon: Ninetta Lights, MD;  Location: [facility];  Service: Orthopedics;  Laterality: Right;  . STRABISMUS SURGERY Right 01/26/2008   2 other surgeries previously  . Thumb surgery  2/13 & ?2010   Bilateral - states a joint was replaced  . TOTAL VAGINAL HYSTERECTOMY  89 - or age 12   secondary AUB   Social History   Social History Narrative   Lives at home with husband and granddaughter.       Objective: Vital Signs: BP 130/80   Pulse 78   Resp 14   Ht _0  (1.727 m)   Wt 204 lb (92.5 kg)   LMP 09/13/1984 (Approximate)   BMI 31.02 kg/m    Physical Exam  Constitutional: She is oriented to person, place, and time. She appears well-developed and well-nourished.  HENT:  Head: Normocephalic and atraumatic.  Eyes: EOM are normal. Pupils are equal, round, and reactive to light.  Cardiovascular: Normal rate, regular rhythm and normal heart sounds.  Exam reveals no gallop and no friction rub.   No murmur heard. Pulmonary/Chest: Effort normal and breath sounds normal. She has no wheezes. She has no rales.  Abdominal: Soft. Bowel sounds are normal. She exhibits no distension. There is no tenderness. There is no guarding. No hernia.  Musculoskeletal: Normal range of motion. She exhibits no edema, tenderness or deformity.  Lymphadenopathy:    She has no cervical adenopathy.  Neurological: She is alert and oriented to person, place, and time. Coordination normal.  Skin: Skin is warm and dry. Capillary refill takes less than 2 seconds. No rash noted.  Psychiatric: She has a normal mood and affect. Her behavior is normal.  Nursing note and vitals reviewed.    Musculoskeletal Exam:  Decreased range of motion of left shoulder joint. Full range of motion of other joints. Grip strength is equal and strong bilaterally Fibromyalgia tender points are 2 out of 18 positive. (Bilateral trapezius  muscles).  CDAI Exam: No CDAI exam completed.  No synovitis on examination  Investigation: No additional findings. Office Visit on 06/03/2016  Component Date Value Ref Range Status  . COMMENTS: 06/03/2016 [facility]   Final-Edited   Comment: [place], [place] [address] [place] [address] GYN CYTOLOGY REPORT  **ABNORMAL PAP** PATIENT NAME:Settlemyre, Bernis C PATHOLOGY#:[id number]: F DOB: August 06, 1946 (Age: 41) MEDICAL RECORD BPZWCH:[id number] [place], MD DATE OBTAINED:9/21/2017CLIENT:[facility] DATE RECEIVED:9/22/2017OTHER PHYS: DATE SIGNED:06/07/2016 PAP- ThinLayer Final Cytologic Interpretation:       Vaginal Cuff, ThinLayer with Automated Imaging and Dual Review, CPT 88175      **Atypical squamous cells of undetermined significance (ASC-US).       ADEQUACY OF SPECIMEN:           Satisfactory for evaluation. The presence or absence of endocervical cells/transformation zone component cannot be determined due to atrophy.  OTHER CYTOLOGIC FINDINGS:            Scant cellularity.       NOTE: This Pap test has been evaluated with computer assisted technology.       Electronically signed by: Algie Coffer. Nolene Bernheim, [place], [place] (Med. Dir.: Gambell Whitson)  CMT, CT(ASCP), [address], [place], [place], (Med. Dir.: Sandrea Hughs, MD) kom/9/25/2017The Pap test is a screening mechanism with excellent but not perfect ability to prevent cervical carcinoma.  It has a low, but significant, diagnostic error  rate. The pap test is suboptimal  for detection of glandular lesions.  It should be noted that a negative result does not definitively rule out the presence of disease.Ref: DeMay, RM, The Art and Science of Cytopathology, [employer], [phone]. Last Menstrual Period: 09/13/1984  Other Clinical Conditions: Other: RECENT DX OF CONDYLOMA OF  VULVA/PERINEUM Other: LAST PAP 05/2016 UNSAT Technical processing performed at [facility], [address] [place], [place] [address], CLIA# [id number], unless otherwise indicated.   Marland Kitchen COMMENTS: 06/03/2016 [facility]   Final-Edited   Comment: [place], [place] [address] Tel: [phone]  Fax: [phone] SURGICAL PATHOLOGY REPORT  PATIENT NAME:Mcinerny, Alanee C PATHOLOGY#:[id number]: F DOB: 05-18-1946 (Age: 24) DATE OBTAINED:9/21/2017DOCTOR:Brook Quincy Simmonds, MD DATE RECEIVED:9/22/2017MRN#:[id number] DATE SIGNED:9/25/2017CLIENT:[facility] ACCOUNT [id number] OTHER  PHYS: LOCATION:  PREOPERATIVE DIAGNOSIS:Vulvar lesion, 624.8, N90.89 OPERATION:Left vulva biopsy POSTOPERATIVE DIAGNOSIS:Vulvar lesion SPECIMEN(S): Left Vulva FINAL MICROSCOPIC DIAGNOSIS: LEFT VULVA, BIOPSY: -LOW-GRADE SQUAMOUS INTRAEPITHELIAL LESION (MILD SQUAMOUS DYSPLASIA; VIN-1) COMMENT: This case was reviewed by Dr. Fletcher Anon, and he agrees with the stated diagnosis. ---Electronically Signed Out By---kom/9/25/2017Kelley O. Montoya, MD[address] [place], [place]. Dir.: [place] L. Whitson   GROSS DESCRIPTION: Two identifiers, one container(s). Labeled:  Left vulva biopsy Fixative:  Forma                          lin Specimen Type:  Multiple unoriented gray-white to green excisions Specimen Dimensions:  0.2-0.4 cm in greatest dimension Slide Key:  In toto in A Gross examination performed at: [facility], [address], [place], [place] [address], CLIA# [id number], Laboratory Director Sandrea Hughs, M.D. cr/06/07/2016 CPS   . COMMENTS: 06/03/2016 [facility]   Final-Edited   Comment: [place], [place], [place] Tel: [phone] Fax: [phone] MOLECULAR PATHOLOGY REPORT PATIENT NAME:Schachter, Issa C PATHOLOGY#:[id number]: F DOB:  03/21/1946 (Age: 65) MEDICAL RECORD PQDIYM:[id number] DOCTOR:Brook Quincy Simmonds, MD DATE OBTAINED:9/21/2017CLIENT:[facility] DATE RECEIVED:9/28/2017OTHER PHYS: DATE SIGNED:06/11/2016 Patient Results:       Human Papilloma Virus-High Risk ThinPrep, [id number]      High Risk - Not Detected         Explanation Reference Range = Not Detected A result of "Detected" signifies the presence of one or more high risk types of HPV.  The APTIMA HPV Assay is an in vitro nucleic acid amplification test for the qualitative detection of E6/E7 viral messenger RNA (mRNA) from 14 high-risk types of  human papillomavirus (HPV) in cervical specimens. The high-risk HPV types detected by the assay include: 16, 18, 31, 33, 35, 39, 45, 51, 52, 56, 58, 59, 66, and 68. APTIMA HPV met  hod will be performed on the [employer].  The APTIMA HPV Assay is designed to enhance existing methods for the detection of cervical disease and should be used in conjunction with clinical information derived from other diagnostic and screening tests, physical examinations, and full medical  history in accordance with appropriate patient management procedures. The APTIMA HPV Assay on ThinPrep(tm) PreservCyt(tm) specimens is FDA approved on the [employer]. The APTIMA HPV Assay on SurePath(tm) specimens was developed and its performance characteristics determined by [facility].   It has not been cleared or approved by the U.S. Food and Drug Administration.  The FDA has determined that such clearance or approval is not necessary.  This test is used for clinical purposes.  This laboratory is certified under the [place] (CLIA) as qualified to perform high complexity clinical laboratory test                          ing. ---Electronically Signed Out By--- san/06/11/2016 SN, [place](ASCP)[address], [place], [place].:  Sandrea Hughs   Office Visit on 05/27/2016  Component Date Value Ref Range Status  . COMMENTS: 05/27/2016 [facility]   Final-Edited   Comment: [place], [place] [address] [place] [address] GYN CYTOLOGY REPORT  PATIENT NAME:Lutes, DULCE C PATHOLOGY#:[id number]: F DOB: 01-01-1946 (Age: 59) MEDICAL RECORD KNLZJQ:[id number] DOCTOR:Brook Quincy Simmonds, MD DATE OBTAINED:9/14/2017CLIENT:[facility] DATE RECEIVED:9/15/2017OTHER PHYS: DATE SIGNED:05/31/2016 PAP- ThinLayer Final Cytologic Interpretation:       Vaginal Cuff, ThinLayer with Automated Imaging and Dual Review, CPT 88175      Unsatisfactory       ADEQUACY OF SPECIMEN:           Specimen processed and examined, but unsatisfactory for evaluation-as a result of too few squamous cells to interpret.             NOTE: This Pap test has been evaluated with computer assisted technology.       Electronically signed by: [place], [place](ASCP), [address], [place], [place], (Med. Dir.: Sandrea Hughs, MD)  CMT, CT(ASCP) cmt/9/18/2017The Pap test is a screening mechanism with excellent but not perfect ability to prevent cer                          vical carcinoma.  It has a low, but significant, diagnostic error rate. The pap test is suboptimal  for detection of glandular lesions.   It should be noted that a negative result does not definitively rule out the presence of disease.Ref: DeMay, RM, The Art and Science of Cytopathology, [employer], [phone]. Last Menstrual Period: 09/13/1984 Menstrual/Pregnancy History: Hysterectomy   Other Clinical Conditions: Other: VULVAR CONDYLOMA Technical processing performed at [facility], [address] [place], [place] [address], CLIA# [id number], unless otherwise indicated.      Imaging: No results found.  Speciality Comments: No specialty comments available.    Procedures:  Large Joint Inj Date/Time: 10/18/2016 12:01  PM Performed by: Eliezer Lofts Authorized by: Eliezer Lofts   Consent Given by:  Patient Site marked: the procedure site was marked   Timeout: prior to procedure the correct patient, procedure, and site was verified   Indications:  Pain Location:  Shoulder Site:  L glenohumeral Prep: patient was prepped and draped in usual sterile fashion   Needle Size:  27 G Needle Length:  1.5 inches Approach:  Posterior Ultrasound Guidance: No   Fluoroscopic Guidance: No   Arthrogram: No   Medications:  1 mL lidocaine 1 %; 40 mg triamcinolone acetonide 40 MG/ML Aspiration Attempted: Yes   Aspirate amount (mL):  0 Patient tolerance:  Patient tolerated the procedure well with no immediate complications  Patient was 100% improved after the left shoulder joint was injected. Large Joint Inj Date/Time: 10/18/2016 12:02 PM Performed by: Eliezer Lofts Authorized by: Eliezer Lofts   Consent Given by:  Patient Site marked: the procedure site was marked   Timeout: prior to procedure the correct patient, procedure, and site was verified   Indications:  Pain Location:  Knee Site:  R knee Prep: patient was prepped and draped in usual sterile fashion   Needle Size:  27 G Needle Length:  1.5 inches Approach:  Medial Ultrasound Guidance: No   Fluoroscopic Guidance: No   Arthrogram: No   Medications:  1.5 mL lidocaine 1 %; 40 mg triamcinolone acetonide 40 MG/ML Aspiration Attempted: Yes   Aspirate amount (mL):  0 Patient tolerance:  Patient tolerated the procedure well with no immediate complications   Allergies: Penicillins and Sulfa antibiotics   Assessment / Plan:     Visit Diagnoses: Osteoarthritis of right shoulder, unspecified osteoarthritis type - Plan: Ambulatory referral to Physical Therapy  Polymyalgia rheumatica (HCC) - 10/18/2016:==> Currently prednisone 2 mg for February and March; redness on 1 mg April and May - Plan: Ambulatory referral to Physical Therapy  Bilateral chronic  knee pain - Plan: XR KNEE 3 VIEW LEFT, XR KNEE 3 VIEW RIGHT, Ambulatory referral to Physical Therapy  Spondylosis of lumbar region without myelopathy or radiculopathy  Pain in joint of left shoulder  Chronic pain of right knee   Plan: #1: Polymyalgia rheumatica. Having increased pain in her left shoulder joint on related to PMR, in my opinion). Is able to get up from a seated position but patient has trouble little bit. Currently tapering off of her prednisone. Currently on 2 mg of prednisone. Incidentally tapering by one month and then to taper off by 2 months. Specifically, she'll do prednisone this month and next month, and then 1 mg of prednisone in April and May. I'll have her come back in 3 months to make sure that that scheduled as appropriate for the patient.  #2: Left shoulder joint pain. Decreased range of motion of left shoulder joint. She is able to move it fully but with significant discomfort. After informed consent obtained the site was prepped in usual sterile fashion injected posteriorly with 40 mg of Kenalog mixed with 1 1/2 ml of 1% lidocaine. Patient tolerated procedure well. There are no complications. Patient had full range of motion after the injection. She was under percent improved.  #3: Due to deconditioning and bilateral shoulder joint and hip joint weakness and pain, I'm been of send her to physical therapy for evaluation and treatment  #4: Bilateral knee joint pain Patient also requested injection in the right knee joint with cortisone since her left shoulder did so well. Using the standard procedures, I gave her 40 mg of Kenalog and 1-1/2 mL's 1% lidocaine. Patient tolerated procedure well.  #5: X-ray of bilateral knee joint today. Right knee shows severe medial compartment narrowing Left knee shows moderate medial compartment narrowing with pseudogout noted on x-ray (new diagnosis). I have sent a message to Seth Bake advising her to update patient on the  x-ray results and the need diagnosis of pseudogout and I called in  colchicine for the patient. Patient has a follow-up in 3 months and we will discuss in detail at that office visit  #6: Return to clinic in 3 months. At that time I'll make sure that the schedule for her prednisone taper is adequate. I change her today and set of 1 mg per month to decrease, she is at 2 mg currently and she should take that for 2 months and then she should go to 1 mg of prednisone and she should take that for 2 months before discontinuing. Patient description.  Orders: Orders Placed This Encounter  Procedures  . Large Joint Injection/Arthrocentesis  . Large Joint Injection/Arthrocentesis  . XR KNEE 3 VIEW LEFT  . XR KNEE 3 VIEW RIGHT  . Ambulatory referral to Physical Therapy   No orders of the defined types were placed in this encounter.   Face-to-face time spent with patient was 30 minutes. 50% of time was spent in counseling and coordination of care.  Follow-Up Instructions: Return in about 3 months (around 01/15/2017) for pmr,fms,openia,.   Eliezer Lofts, PA-C  Note - This record has been created using [employer].  Chart creation errors have been sought, but may not always  have been located. Such creation errors do not reflect on  the standard of medical care.

## 2016-10-25 ENCOUNTER — Telehealth: Payer: Self-pay | Admitting: *Deleted

## 2016-10-25 NOTE — Telephone Encounter (Signed)
Patient advised of results and verbalized understanding. Patient will cal back to let us know how Colchicine is working.

## 2016-10-25 NOTE — Telephone Encounter (Signed)
-----   Message from Tawni PummelNaitik Panwala, [place] sent at 10/18/2016 12:11 PM EST ----- Regarding: New diagnosis of pseudogout; APPLY euflexxa x 3 both knees Sue LushAndrea, Please tell patient #1: Right knee with severe osteoarthritis. The cortisone injection given in the right knee today should be helping the patient some We should move forward with Euflex of both knees  #2: Left knee with moderate OA. On that x-ray, I saw pseudogout. For that reason, I've called in prescription for colchicine. Patient should take 1 pill per day. In about a month, please ask her to call us back and let us know how she is doing in her knees with that colchicine on board.  #3: Apply for EuflexXA 3 both knees Failed an Sayed, failed weight loss, X-rays prove OA of bilateral knees Patient has had cortisone injection in the right knee.

## 2016-11-29 ENCOUNTER — Other Ambulatory Visit: Payer: Self-pay | Admitting: Obstetrics and Gynecology

## 2016-11-29 DIAGNOSIS — Z1231 Encounter for screening mammogram for malignant neoplasm of breast: Secondary | ICD-10-CM

## 2016-12-20 ENCOUNTER — Ambulatory Visit
Admission: RE | Admit: 2016-12-20 | Discharge: 2016-12-20 | Disposition: A | Discharge status: Home / Self Care | Payer: Medicare Other | Source: Ambulatory Visit | Attending: Obstetrics and Gynecology | Admitting: Obstetrics and Gynecology

## 2016-12-20 DIAGNOSIS — Z1231 Encounter for screening mammogram for malignant neoplasm of breast: Secondary | ICD-10-CM

## 2017-01-13 NOTE — Telephone Encounter (Signed)
Apply for EuflexXA 3 both knees Failed an [name], failed weight loss, X-rays prove OA of bilateral knees Patient has had cortisone injection in the right knee. [name]

## 2017-01-20 ENCOUNTER — Ambulatory Visit (INDEPENDENT_AMBULATORY_CARE_PROVIDER_SITE_OTHER): Payer: Medicare Other | Admitting: Rheumatology

## 2017-01-20 ENCOUNTER — Encounter: Payer: Self-pay | Admitting: Rheumatology

## 2017-01-20 VITALS — BP 136/78 | HR 74 | Resp 16 | Ht 68.0 in | Wt 198.0 lb

## 2017-01-20 DIAGNOSIS — M353 Polymyalgia rheumatica: Secondary | ICD-10-CM | POA: Diagnosis not present

## 2017-01-20 DIAGNOSIS — M170 Bilateral primary osteoarthritis of knee: Secondary | ICD-10-CM | POA: Insufficient documentation

## 2017-01-20 DIAGNOSIS — G8929 Other chronic pain: Secondary | ICD-10-CM

## 2017-01-20 DIAGNOSIS — R5383 Other fatigue: Secondary | ICD-10-CM

## 2017-01-20 DIAGNOSIS — G4700 Insomnia, unspecified: Secondary | ICD-10-CM

## 2017-01-20 DIAGNOSIS — Z79899 Other long term (current) drug therapy: Secondary | ICD-10-CM | POA: Diagnosis not present

## 2017-01-20 DIAGNOSIS — M62838 Other muscle spasm: Secondary | ICD-10-CM | POA: Diagnosis not present

## 2017-01-20 DIAGNOSIS — M25512 Pain in left shoulder: Secondary | ICD-10-CM

## 2017-01-20 DIAGNOSIS — M797 Fibromyalgia: Secondary | ICD-10-CM | POA: Diagnosis not present

## 2017-01-20 MED ORDER — BACLOFEN 10 MG PO TABS: 10.0000 mg | ORAL_TABLET | Freq: Two times a day (BID) | ORAL | 2 refills | Status: DC | PRN

## 2017-01-20 MED ORDER — LIDOCAINE HCL 1 % IJ SOLN
0.3000 mL | INTRAMUSCULAR | Status: AC | PRN
  Administered 2017-01-20: .3 mL

## 2017-01-20 MED ORDER — BACLOFEN 10 MG PO TABS: 10.0000 mg | ORAL_TABLET | Freq: Two times a day (BID) | ORAL | 2 refills | Status: AC | PRN

## 2017-01-20 NOTE — Progress Notes (Signed)
Office Visit Note  Patient: [name]             Date of Birth: 04/03/1946           MRN: [id number]             PCP: Kaleen Mask, MD Referring: Kaleen Mask, * Visit Date: 01/20/2017 Occupation: @GUAROCC @    Subjective:  Follow-up   History of Present Illness: [name] is a 71 y.o. female  Last seen in our office 10/18/2016. She returns today for follow-up on PMR, fibromyalgia.  At the last visit, she was on prednisone for her PMR. On that visit, she was on prednisone 2 mg daily. We had planned for her to do the 2 mg for February and March and then go down to 1 mg for April and May. We were going to see her in May to see if we can taper off of the prednisone completely  Today, pt reports that she started prednisone 1 mg at first of May. She will do 1 mg for remainder of May and I will advise her to do 1 mg for June 2018 and then we will discontinue the prednisone.  She did really well with her left shoulder joint injection of cortisone at the last visit. The medication wore off about 4 weeks ago. Asian rates the pain about 6-7 on a scale of 0-10 on bad days. Patient's left shoulder does bother her some all the time and she rates her baseline discomfort at about a 2 on a scale of 0-10.  Her main concern is her right knee is really bothering her. In fact, her pain was so significant this morning in her bathroom that she fell because she could not put further weight on her right knee. She did not sustain any other injury) and no head injury).. She did hit her arm and fell on her bottom but she states that they're not bothering her at this time.  Patient also complains of bilateral trapezius muscle pain radiating to her neck and her left shoulder joint. She rates her pain as a 9 on a scale of 0-10.   Activities of Daily Living:  Patient reports morning stiffness for 15 minutes.   Patient Denies nocturnal pain.  Difficulty dressing/grooming:  Denies Difficulty climbing stairs: Reports Difficulty getting out of chair: Reports Difficulty using hands for taps, buttons, cutlery, and/or writing: Reports   Review of Systems  Constitutional: Positive for fatigue.  HENT: Negative for mouth sores and mouth dryness.   Eyes: Negative for dryness.  Respiratory: Negative for shortness of breath.   Gastrointestinal: Negative for constipation and diarrhea.  Musculoskeletal: Positive for myalgias and myalgias.  Skin: Negative for sensitivity to sunlight.  Psychiatric/Behavioral: Positive for sleep disturbance. Negative for decreased concentration.    PMFS History:  Patient Active Problem List   Diagnosis Date Noted  . Chronic left shoulder pain 01/20/2017  . Primary osteoarthritis of both knees 01/20/2017  . Trapezius muscle spasm 01/20/2017  . Insomnia 01/20/2017  . Fatigue 01/20/2017  . Fibromyalgia 01/20/2017  . High risk medications (not anticoagulants) long-term use 01/20/2017  . PMR (polymyalgia rheumatica) (HCC) 01/20/2017  . Left ovarian cyst 06/02/2015  . Degenerative joint disease, shoulder, right 08/02/2013  . Precordial pain 02/11/2012  . HTN (hypertension) 02/11/2012  . Hyperlipidemia 02/11/2012    Past Medical History:  Diagnosis Date  . Bilateral ovarian cysts 09/12/14   bilateral simple ovarian cysts  . Degenerative arthritis of right  shoulder region 07/2013  . Fibromyalgia   . HTN (hypertension)    under control with meds., has been on med. x 20 yr.  . Hyperlipidemia   . Hypothyroid   . IBS (irritable bowel syndrome)   . Impingement syndrome of right shoulder 07/2013  . PONV (postoperative nausea and vomiting)   . Rupture of right rotator cuff 07/2013  . VIN I (vulvar intraepithelial neoplasia I) 2017   this is from a perineal/vulvar biopsy.  . Vitamin B12 deficiency   . Wears partial dentures    lower    Family History  Problem Relation Age of Onset  . Aortic aneurysm Father   . Hypertension  Father   . Coronary artery disease Mother 68  . Hypertension Mother   . Hyperlipidemia Brother        brain tumor unknown if malignant   Past Surgical History:  Procedure Laterality Date  . ANTERIOR LAT LUMBAR FUSION  10/08/2008   L3-4  . BACK SURGERY    . BACK SURGERY  10-03-14   --Dr. Trey S[facility]ors at [facility]  . COLONOSCOPY W/ BIOPSIES  11/2009   polyps  . COLONOSCOPY W/ BIOPSIES  11/2012   polyp, recheck in 3 years  . CYSTO  07/05/2006  . CYSTOCELE REPAIR  07/05/2006  . ESOPHAGOGASTRODUODENOSCOPY ENDOSCOPY  05/25/05   erythema and edema of gastric lumen  . FOOT SURGERY Right   . GANGLION CYST EXCISION Right    wrist  . INCONTINENCE SURGERY  07/05/2006   cystourethropexy  . ORIF FOREARM FRACTURE Right 09/2012  . SHOULDER ARTHROSCOPY WITH ROTATOR CUFF REPAIR AND SUBACROMIAL DECOMPRESSION Right 08/02/2013   Procedure: SHOULDER ARTHROSCOPY WITH ROTATOR CUFF REPAIR AND SUBACROMIAL DECOMPRESSION, EXTENSIVE DEBRIDEMENT (INCLUDING LABRUM), PARTIAL ACROMIPLASY WITH CORACOACROMIAL RELEASE, DISTAL CLAVICULECTOMY;  Surgeon: Loreta Ave, MD;  Location: [facility];  Service: Orthopedics;  Laterality: Right;  . STRABISMUS SURGERY Right 01/26/2008   2 other surgeries previously  . Thumb surgery  2/13 & ?2010   Bilateral - states a joint was replaced  . TOTAL VAGINAL HYSTERECTOMY  1986 - or age 42   secondary AUB  . varicose veins Bilateral    Social History   Social History Narrative   Lives at home with husband and granddaughter.       Objective: Vital Signs: BP 136/78   Pulse 74   Resp 16   Ht 5\' 8"  (1.727 m)   Wt 198 lb (89.8 kg)   LMP 09/13/1984 (Approximate)   BMI 30.11 kg/m    Physical Exam  Constitutional: She is oriented to person, place, and time. She appears well-developed and well-nourished.  HENT:  Head: Normocephalic and atraumatic.  Eyes: EOM are normal. Pupils are equal, round, and reactive to light.  Cardiovascular: Normal rate, regular  rhythm and normal heart sounds.  Exam reveals no gallop and no friction rub.   No murmur heard. Pulmonary/Chest: Effort normal and breath sounds normal. She has no wheezes. She has no rales.  Abdominal: Soft. Bowel sounds are normal. She exhibits no distension. There is no tenderness. There is no guarding. No hernia.  Musculoskeletal: Normal range of motion. She exhibits no edema, tenderness or deformity.  Lymphadenopathy:    She has no cervical adenopathy.  Neurological: She is alert and oriented to person, place, and time. Coordination normal.  Skin: Skin is warm and dry. Capillary refill takes less than 2 seconds. No rash noted.  Psychiatric: She has a normal mood and affect. Her behavior is  normal.  Nursing note and vitals reviewed.    Musculoskeletal Exam:    CDAI Exam: CDAI Homunculus Exam:   Joint Counts:  CDAI Tender Joint count: 0 CDAI Swollen Joint count: 0  Global Assessments:  Patient Global Assessment: 9 Provider Global Assessment: 9  CDAI Calculated Score: 18    Investigation:  .GETLABS  Findings:    Labs: 11/10/2015  G6 PD = 12.8 (normal) CK =152  (normal)    Imaging: No results found.  Speciality Comments: No specialty comments available.    Procedures:  Trigger Point Inj Date/Time: 01/20/2017 9:41 AM Performed by: Tawni Pummel Authorized by: Tawni Pummel   Consent Given by:  Patient Site marked: the procedure site was marked   Timeout: prior to procedure the correct patient, procedure, and site was verified   Indications:  Muscle spasm and pain Total # of Trigger Points:  2 Location: neck   Needle Size:  27 G Approach:  Dorsal Medications #2:  0.3 mL lidocaine 1 % Patient tolerance:  Patient tolerated the procedure well with no immediate complications Comments: Bilateral trapezius muscle spasm. Pain is rated 9 on a scale of 0-10. About a minute after the injection, patient rates her discomfort as a 1 on a scale of  0-10.   Allergies: Penicillins and Sulfa antibiotics   Assessment / Plan:     Visit Diagnoses: PMR (polymyalgia rheumatica) (HCC)  High risk medications (not anticoagulants) long-term use - 01/20/2017: Prednisone 1 mg 4 May and June 2018  Fibromyalgia  Fatigue, unspecified type  Insomnia, unspecified type  Trapezius muscle spasm  Primary osteoarthritis of both knees  Chronic left shoulder pain   Plan: #1: Polymyalgia rheumatica. Patient is doing well. No shoulder girdle and hip girdle pain and weakness from PMR. Currently is on prednisone taper. Was on prednisone 2 mg every morning February, March, April. Currently is on prednisone 1 mg every morning since 01/11/2017. We will continue patient on prednisone 1 mg for remainder of May, all of June.  #2: High-risk prescription. Prednisone taper 1 mg for May and June  #3: Left shoulder joint pain. Responded very well to cortisone injection given at the last visit in February. Injection help the patient for about 2 months and then pain is starting to creep and again. Current pain is described about 4-5 on a scale of 0-10.  #4: Right knee pain. Patient responded well to cortisone injection given last visit in February. Right knee pain is significant and patient could not bear weight this morning. The right knee gave way while she was in the bathroom secondary to pain. She fell on the floor did not sustain any injuries. We applied for Visco supplementation at the last visit the patient has not heard anything back for the Visco.  #5: Bilateral trapezius muscle spasms. After informed consent was obtained the site was prepped in usual stroke fashion injected with 0.3 and also 0.25% bupivacaine mixed with 10 mg of Kenalog. Patient tolerated procedure well. She rated her pain is 9 on a scale of 0-10 prior to the injection. After the injection, she rated the pain is 1 on a scale of 0-10.  #6: Fibromyalgia syndrome. She rates her  overall fiber pain as 6 on a scale of 0-10. Some days are better than others. She recently has less stress because she stopped working at a job site which was very stressful on a daily basis  #7: Ongoing fatigue and insomnia at times.  #8: I would like to transition the  patient off of a prednisone taper to PLQ. Patient's G6PD was done February 2017 and was normal. Unfortunately patient does state that she has sulfa allergies. She states that about 40 years ago she might have had allergic reaction to penicillin or sulfa and does not know any further details than that. She is also unsure whether she had a true allergy and she is unsure whether it was to sulfa or not.  #9: Return to clinic in 7 weeks. We would like to bring the patient back before she finishes her last week of prednisone. If she is not having any flares we can discontinue the prednisone altogether. Since she has not had any flares, Dr. Corliss Skainseveshwar feels that she may not need any methotrexate. (Plaquenil was not an option since the patient has a history of sulfa allergies.)  #10: Offered patient muscle spasm medication for her fibromyalgia. Baclofen 10 mg up to 2 times a day when necessary dispense 30 days supply with 2 refills  #11: We wanted to review patient's bone density status. She has been on long-term prednisone for her PMR. I do not see any copies of her recent bone density and patient is agreed to bring in a copy at the next visit.  Orders: Orders Placed This Encounter  Procedures  . Trigger Point Injection   Meds ordered this encounter  Medications  . DISCONTD: baclofen (LIORESAL) 10 MG tablet    Sig: Take 1 tablet (10 mg total) by mouth 2 (two) times daily as needed for muscle spasms.    Dispense:  30 each    Refill:  2    Order Specific Question:   Supervising Provider    Answer:   Vanessa KickEVESHWAR, SHAILI [[id number]]  . baclofen (LIORESAL) 10 MG tablet    Sig: Take 1 tablet (10 mg total) by mouth 2 (two) times daily as  needed for muscle spasms.    Dispense:  60 each    Refill:  2    Order Specific Question:   Supervising Provider    Answer:   Pollyann SavoyEVESHWAR, SHAILI [phone][[id number]]    Face-to-face time spent with patient was 30 minutes. 50% of time was spent in counseling and coordination of care.  Follow-Up Instructions: Return in about 7 weeks (around 03/10/2017) for PMR,PREDNISONE 1MG  QD,FMS, FATIGUE,INSOMNIA,.   Tawni PummelNaitik Javiana Anwar, PA-C  Note - This record has been created using [employer] software.  Chart creation errors have been sought, but may not always  have been located. Such creation errors do not reflect on  the standard of medical care.

## 2017-02-01 NOTE — Telephone Encounter (Signed)
IC and they did not receive fax, so I have submitted online, will followup.

## 2017-02-01 NOTE — Telephone Encounter (Signed)
IC patient LMVM to call me to discuss VOB.

## 2017-02-01 NOTE — Telephone Encounter (Signed)
Faxed to [facility] for VOB, will followup.  

## 2017-02-17 NOTE — Telephone Encounter (Signed)
IC patient again and we discussed further.  I told her that from what I can see online for allowable cost of J7323 from Medicare, she would owe $186.93 for all 6 injections (this is the 20% OOP per her insurance).  She would owe $50 copay each visit.  Together this equals $336.93 OOP estimated only.  Patient will discuss with Lisabeth RegisterHans at her f/u in a few weeks.  She is not sure what she wants to do yet.

## 2017-03-03 DIAGNOSIS — Z8679 Personal history of other diseases of the circulatory system: Secondary | ICD-10-CM | POA: Insufficient documentation

## 2017-03-03 DIAGNOSIS — Z9889 Other specified postprocedural states: Secondary | ICD-10-CM | POA: Insufficient documentation

## 2017-03-03 DIAGNOSIS — Z8639 Personal history of other endocrine, nutritional and metabolic disease: Secondary | ICD-10-CM | POA: Insufficient documentation

## 2017-03-03 DIAGNOSIS — M5136 Other intervertebral disc degeneration, lumbar region: Secondary | ICD-10-CM | POA: Insufficient documentation

## 2017-03-03 DIAGNOSIS — M19041 Primary osteoarthritis, right hand: Secondary | ICD-10-CM | POA: Insufficient documentation

## 2017-03-03 DIAGNOSIS — M8589 Other specified disorders of bone density and structure, multiple sites: Secondary | ICD-10-CM | POA: Insufficient documentation

## 2017-03-03 DIAGNOSIS — M19042 Primary osteoarthritis, left hand: Secondary | ICD-10-CM

## 2017-03-03 NOTE — Progress Notes (Signed)
Office Visit Note  Patient: [name]             Date of Birth: [birth year]           MRN: [id number]             PCP: Kaleen Mask, MD Referring: Kaleen Mask, * Visit Date: 03/04/2017 Occupation: @GUAROCC @    Subjective:  Fatigue and muscle pain.   History of Present Illness: [name] is a 71 y.o. female with history of polymyalgia rheumatica, osteoarthritis and fibromyalgia. She came off prednisone about a month ago. She states her symptoms are gradually getting worse off prednisone she's been having difficulty lifting her arms. She also has difficulty getting out of the chair. She's been having discomfort in her right knee and left shoulder. She denies any joint swelling. Probably some discomfort across her trapezius area.  Activities of Daily Living:  Patient reports morning stiffness for 1 hour.   Patient Denies nocturnal pain.  Difficulty dressing/grooming: Denies Difficulty climbing stairs: Reports Difficulty getting out of chair: Reports Difficulty using hands for taps, buttons, cutlery, and/or writing: Denies   Review of Systems  Constitutional: Positive for fatigue. Negative for night sweats, weight gain, weight loss and weakness.  HENT: Negative for mouth sores, trouble swallowing, trouble swallowing, mouth dryness and nose dryness.   Eyes: Negative for pain, redness, visual disturbance and dryness.  Respiratory: Negative for cough, shortness of breath and difficulty breathing.   Cardiovascular: Negative for chest pain, palpitations, hypertension, irregular heartbeat and swelling in legs/feet.  Gastrointestinal: Negative for blood in stool, constipation and diarrhea.  Endocrine: Negative for increased urination.  Genitourinary: Negative for vaginal dryness.  Musculoskeletal: Positive for arthralgias, joint pain, myalgias, morning stiffness and myalgias. Negative for joint swelling, muscle weakness and muscle tenderness.  Skin:  Negative for color change, rash, hair loss, skin tightness, ulcers and sensitivity to sunlight.  Allergic/Immunologic: Negative for susceptible to infections.  Neurological: Negative for dizziness, memory loss and night sweats.  Hematological: Negative for swollen glands.  Psychiatric/Behavioral: Positive for sleep disturbance. Negative for depressed mood. The patient is not nervous/anxious.     PMFS History:  Patient Active Problem List   Diagnosis Date Noted  . History of repair of right rotator cuff 03/03/2017  . Primary osteoarthritis of both hands/ has had bilateral CMC surgery  03/03/2017  . History of hypertension 03/03/2017  . History of hyperlipidemia 03/03/2017  . DDD (degenerative disc disease), lumbar/ history of lumbar surgery  03/03/2017  . Osteopenia of multiple sites 03/03/2017  . Chronic left shoulder pain 01/20/2017  . Primary osteoarthritis of both knees 01/20/2017  . Trapezius muscle spasm 01/20/2017  . Insomnia 01/20/2017  . Fatigue 01/20/2017  . Fibromyalgia 01/20/2017  . High risk medications (not anticoagulants) long-term use 01/20/2017  . PMR (polymyalgia rheumatica) (HCC) 01/20/2017  . Left ovarian cyst 06/02/2015  . Degenerative joint disease, shoulder, right 08/02/2013  . Precordial pain 02/11/2012  . HTN (hypertension) 02/11/2012  . Hyperlipidemia 02/11/2012    Past Medical History:  Diagnosis Date  . Bilateral ovarian cysts 09/12/14   bilateral simple ovarian cysts  . 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 Fibromyalgia   . HTN (hypertension)    under control with meds., has been on med. x 20 yr.  . Hyperlipidemia   . Hypothyroid   . IBS (irritable bowel syndrome)   . Impingement syndrome of right shoulder 07/2013  . PONV (postoperative nausea and vomiting)   .  Rupture of right rotator cuff 07/2013  . VIN I (vulvar intraepithelial neoplasia I) 2017   this is from a perineal/vulvar biopsy.  . Vitamin B12 deficiency   .  Wears partial dentures    lower    Family History  Problem Relation Age of Onset  . Aortic aneurysm Father   . Hypertension Father   . Coronary artery disease Mother [id number]  . Hypertension Mother   . Hyperlipidemia Brother        brain tumor unknown if malignant   Past Surgical History:  Procedure Laterality Date  . ANTERIOR LAT LUMBAR FUSION  10/08/2008   L3-4  . BACK SURGERY    . BACK SURGERY  10-03-14   --[facility]  . COLONOSCOPY W/ BIOPSIES  11/2009   polyps  . COLONOSCOPY W/ BIOPSIES  11/2012   polyp, recheck in 3 years  . CYSTO  07/05/2006  . CYSTOCELE REPAIR  07/05/2006  . ESOPHAGOGASTRODUODENOSCOPY ENDOSCOPY  05/25/05   erythema and edema of gastric lumen  . FOOT SURGERY Right   . GANGLION CYST EXCISION Right    wrist  . INCONTINENCE SURGERY  07/05/2006   cystourethropexy  . ORIF FOREARM FRACTURE Right 09/2012  . SHOULDER ARTHROSCOPY WITH ROTATOR CUFF REPAIR AND SUBACROMIAL DECOMPRESSION Right 08/02/2013   Procedure: SHOULDER ARTHROSCOPY WITH ROTATOR CUFF REPAIR AND SUBACROMIAL DECOMPRESSION, EXTENSIVE DEBRIDEMENT (INCLUDING LABRUM), PARTIAL ACROMIPLASY WITH CORACOACROMIAL RELEASE, DISTAL CLAVICULECTOMY;  Surgeon: Loreta Aveaniel F Murphy, MD;  Location: [facility];  Service: Orthopedics;  Laterality: Right;  . STRABISMUS SURGERY Right 01/26/2008   2 other surgeries previously  . Thumb surgery  2/13 & ?2010   Bilateral - states a joint was replaced  . TOTAL VAGINAL HYSTERECTOMY  1986 - or age 71   secondary AUB  . varicose veins Bilateral    Social History   Social History Narrative   Lives at home with husband and granddaughter.       Objective: Vital Signs: BP 134/74   Pulse (!) 105   Resp 14   Ht 5\' 8"  (1.727 m)   Wt 194 lb (88 kg)   LMP 09/13/1984 (Approximate)   BMI 29.50 kg/m    Physical Exam  Constitutional: She is oriented to person, place, and time. She appears well-developed and well-nourished.  HENT:  Head:  Normocephalic and atraumatic.  Eyes: Conjunctivae and EOM are normal.  Neck: Normal range of motion.  Cardiovascular: Normal rate, regular rhythm, normal heart sounds and intact distal pulses.   Pulmonary/Chest: Effort normal and breath sounds normal.  Abdominal: Soft. Bowel sounds are normal.  Lymphadenopathy:    She has no cervical adenopathy.  Neurological: She is alert and oriented to person, place, and time.  Skin: Skin is warm and dry. Capillary refill takes less than 2 seconds.  Psychiatric: She has a normal mood and affect. Her behavior is normal.  Nursing note and vitals reviewed.    Musculoskeletal Exam: C-spine, thoracic, lumbar spine good range of motion. She has some discomfort range of motion of her lumbar spine. She has discomfort in her shoulder joints raising both of her arms. Although shoulder joints were full range of motion. Elbow joints wrist joint MCPs PIPs DIPs with good range of motion with no synovitis. Hip joints , knee joints, ankle joints, MTPs PIPs DIPs with good range of motion with no synovitis. Fibromyalgia tender points were 8 out of 18 positive.  CDAI Exam: No CDAI exam completed.    Investigation: No additional  findings.   Imaging: No results found.  Speciality Comments: No specialty comments available.    Procedures:  No procedures performed Allergies: Penicillins and Sulfa antibiotics   Assessment / Plan:     Visit Diagnoses: PMR (polymyalgia rheumatica) (HCC) -patient believes that she is having a polymyalgia flare coming off the prednisone. She complains of difficulty lifting her arm and also getting off the chair. Plan: Sedimentation rate  On long term prednisone therapy -she has been off prednisone for a month now.  Fibromyalgia: She has generalized pain and discomfort and positive tender points.  Other fatigue - Plan: CBC with Differential/Platelet, COMPLETE METABOLIC PANEL WITH GFR, CK, TSH, VITAMIN D 25 Hydroxy (Vit-D Deficiency,  Fractures)  Other insomnia: She does well taking muscle relaxers.  Trapezius muscle spasm: She continues to have some trapezius spasm.  Primary osteoarthritis of both hands/ has had bilateral CMC surgery : Joint protection and muscle strengthening discussed.  Primary osteoarthritis of right shoulder history of right rotator cuff repair : She states her rotator cuff tear repair is doing well.  Primary osteoarthritis of both knees she does have moderate to severe osteoarthritis in her right knee joint which is causing discomfort. She was to schedule Visco supplement injections for that.  DDD (degenerative disc disease), lumbar/ history of lumbar surgery : She continues to have chronic pain and discomfort.  Osteopenia of multiple sites/ bone density through PCP office pt states 2016   History of hypertension: Her blood pressure is well controlled.  History of hyperlipidemia  Sarcoidosis    Orders: Orders Placed This Encounter  Procedures  . CBC with Differential/Platelet  . COMPLETE METABOLIC PANEL WITH GFR  . CK  . TSH  . Sedimentation rate  . VITAMIN D 25 Hydroxy (Vit-D Deficiency, Fractures)   No orders of the defined types were placed in this encounter.   Face-to-face time spent with patient was 30 minutes. 50% of time was spent in counseling and coordination of care.  Follow-Up Instructions: Return in about 6 months (around 09/03/2017) for PMR, DDD, FMS.   Pollyann Savoy, MD  Note - This record has been created using [employer].  Chart creation errors have been sought, but may not always  have been located. Such creation errors do not reflect on  the standard of medical care.

## 2017-03-04 ENCOUNTER — Ambulatory Visit (INDEPENDENT_AMBULATORY_CARE_PROVIDER_SITE_OTHER): Payer: Medicare Other | Admitting: Rheumatology

## 2017-03-04 ENCOUNTER — Encounter: Payer: Self-pay | Admitting: Rheumatology

## 2017-03-04 VITALS — BP 134/74 | HR 105 | Resp 14 | Ht 68.0 in | Wt 194.0 lb

## 2017-03-04 DIAGNOSIS — Z7952 Long term (current) use of systemic steroids: Secondary | ICD-10-CM

## 2017-03-04 DIAGNOSIS — M797 Fibromyalgia: Secondary | ICD-10-CM | POA: Diagnosis not present

## 2017-03-04 DIAGNOSIS — M62838 Other muscle spasm: Secondary | ICD-10-CM

## 2017-03-04 DIAGNOSIS — Z8679 Personal history of other diseases of the circulatory system: Secondary | ICD-10-CM | POA: Diagnosis not present

## 2017-03-04 DIAGNOSIS — M170 Bilateral primary osteoarthritis of knee: Secondary | ICD-10-CM

## 2017-03-04 DIAGNOSIS — M353 Polymyalgia rheumatica: Secondary | ICD-10-CM | POA: Diagnosis not present

## 2017-03-04 DIAGNOSIS — M19042 Primary osteoarthritis, left hand: Secondary | ICD-10-CM

## 2017-03-04 DIAGNOSIS — M5136 Other intervertebral disc degeneration, lumbar region: Secondary | ICD-10-CM | POA: Diagnosis not present

## 2017-03-04 DIAGNOSIS — G4709 Other insomnia: Secondary | ICD-10-CM | POA: Diagnosis not present

## 2017-03-04 DIAGNOSIS — M19011 Primary osteoarthritis, right shoulder: Secondary | ICD-10-CM

## 2017-03-04 DIAGNOSIS — M19041 Primary osteoarthritis, right hand: Secondary | ICD-10-CM

## 2017-03-04 DIAGNOSIS — R5383 Other fatigue: Secondary | ICD-10-CM | POA: Diagnosis not present

## 2017-03-04 DIAGNOSIS — M8589 Other specified disorders of bone density and structure, multiple sites: Secondary | ICD-10-CM

## 2017-03-04 DIAGNOSIS — Z8639 Personal history of other endocrine, nutritional and metabolic disease: Secondary | ICD-10-CM

## 2017-03-04 DIAGNOSIS — D869 Sarcoidosis, unspecified: Secondary | ICD-10-CM

## 2017-03-04 LAB — CBC WITH DIFFERENTIAL/PLATELET
Basophils Absolute: 0 cells/uL (ref 0–200)
Basophils Relative: 0 %
Eosinophils Absolute: 166 cells/uL (ref 15–500)
Eosinophils Relative: 2 %
HCT: 39.5 % (ref 35.0–45.0)
Hemoglobin: 12.3 g/dL (ref 11.7–15.5)
Lymphocytes Relative: 32 %
Lymphs Abs: 2656 cells/uL (ref 850–3900)
MCH: 26.6 pg — ABNORMAL LOW (ref 27.0–33.0)
MCHC: 31.1 g/dL — ABNORMAL LOW (ref 32.0–36.0)
MCV: 85.3 fL (ref 80.0–100.0)
MPV: 10 fL (ref 7.5–12.5)
Monocytes Absolute: 498 cells/uL (ref 200–950)
Monocytes Relative: 6 %
Neutro Abs: 4980 cells/uL (ref 1500–7800)
Neutrophils Relative %: 60 %
Platelets: 326 10*3/uL (ref 140–400)
RBC: 4.63 MIL/uL (ref 3.80–5.10)
RDW: 15 % (ref 11.0–15.0)
WBC: 8.3 10*3/uL (ref 3.8–10.8)

## 2017-03-04 LAB — COMPLETE METABOLIC PANEL WITH GFR
ALT: 18 U/L (ref 6–29)
AST: 20 U/L (ref 10–35)
Albumin: 4 g/dL (ref 3.6–5.1)
Alkaline Phosphatase: 92 U/L (ref 33–130)
BUN: 17 mg/dL (ref 7–25)
CO2: 24 mmol/L (ref 20–31)
Calcium: 9.3 mg/dL (ref 8.6–10.4)
Chloride: 108 mmol/L (ref 98–110)
Creat: 0.7 mg/dL (ref 0.50–0.99)
GFR, Est African American: >89 mL/min (ref >=60)
GFR, Est Non African American: 89 mL/min (ref >=60)
Glucose, Bld: 90 mg/dL (ref 65–99)
Potassium: 4.1 mmol/L (ref 3.5–5.3)
Sodium: 142 mmol/L (ref 135–146)
Total Bilirubin: 0.4 mg/dL (ref 0.2–1.2)
Total Protein: 7 g/dL (ref 6.1–8.1)

## 2017-03-05 LAB — SEDIMENTATION RATE: Sed Rate: 27 mm/hr (ref 0–30)

## 2017-03-05 LAB — CK: Total CK: 153 U/L — ABNORMAL HIGH (ref 29–143)

## 2017-03-05 LAB — TSH: TSH: 0.58 mIU/L

## 2017-03-05 LAB — VITAMIN D 25 HYDROXY (VIT D DEFICIENCY, FRACTURES): Vit D, 25-Hydroxy: 48 ng/mL (ref 30–100)

## 2017-03-06 NOTE — Progress Notes (Signed)
Ck mildly elevated , not concerning. ESR normal. Pt. Is not having a PMR flare.

## 2017-03-08 ENCOUNTER — Telehealth: Payer: Self-pay | Admitting: *Deleted

## 2017-03-08 DIAGNOSIS — M6281 Muscle weakness (generalized): Secondary | ICD-10-CM

## 2017-03-08 NOTE — Telephone Encounter (Signed)
Called patient to advise of lab results. Patient states she is continuing to hurt all over. Patient states she is having trouble lifting her left arm. Patient states she having weakness in her legs causing her to have trouble going up steps. Patient would like to know what she should do. Please advise.

## 2017-03-08 NOTE — Telephone Encounter (Signed)
-----  Message from Bo Merino, MD sent at 03/06/2017  9:34 AM EDT ----- Ck mildly elevated , not concerning. ESR normal. Pt. Is not having a PMR flare.

## 2017-03-09 ENCOUNTER — Telehealth: Payer: Self-pay | Admitting: Rheumatology

## 2017-03-09 NOTE — Telephone Encounter (Signed)
Patient called back. We had detailed discussion regarding her current situation. Her sedimentation rate is normal and her CK is normal. I believe her symptoms are coming from deconditioning. I will refer her to physical therapy. She was in agreement. Please send her a prescription for physical therapy-lower extremity muscle strengthening and for prevention.

## 2017-03-09 NOTE — Telephone Encounter (Signed)
Patient returned your call.

## 2017-03-09 NOTE — Telephone Encounter (Signed)
Attempted to contact the patient and left message for patient to call the office.  

## 2017-03-09 NOTE — Telephone Encounter (Signed)
Patient returning Dr. Reginia Fortseveshwars call. Please call when Dr. Is available.

## 2017-03-10 ENCOUNTER — Ambulatory Visit: Payer: Medicare Other | Admitting: Rheumatology

## 2017-03-10 NOTE — Telephone Encounter (Signed)
Referral placed for patient.  

## 2017-03-10 NOTE — Telephone Encounter (Signed)
See previous phone note.  

## 2017-03-15 DIAGNOSIS — M1711 Unilateral primary osteoarthritis, right knee: Secondary | ICD-10-CM

## 2017-03-15 DIAGNOSIS — M1712 Unilateral primary osteoarthritis, left knee: Secondary | ICD-10-CM | POA: Diagnosis not present

## 2017-03-15 NOTE — Progress Notes (Signed)
   Procedure Note  Patient: [name]             Date of Birth: 13-Sep-1946           MRN: [id number]             Visit Date: 03/21/2017  Procedures: Visit Diagnoses: Primary osteoarthritis of both knees - Plan: Large Joint Injection/Arthrocentesis, Large Joint Injection/Arthrocentesis  Euflexxa #1 Bilateral knees  Large Joint Inj Date/Time: 03/15/2017 10:48 AM Performed by: Pollyann SavoyEVESHWAR, Vincenzina Jagoda Authorized by: Pollyann SavoyEVESHWAR, Philip Kotlyar   Consent Given by:  Patient Site marked: the procedure site was marked   Timeout: prior to procedure the correct patient, procedure, and site was verified   Indications:  Pain and joint swelling Location:  Knee Site:  L knee Prep: patient was prepped and draped in usual sterile fashion   Needle Size:  27 G Needle Length:  1.5 inches Approach:  Medial Ultrasound Guidance: No   Fluoroscopic Guidance: No   Arthrogram: No   Medications:  20 mg Sodium Hyaluronate 20 MG/2ML; 1.5 mL lidocaine 2 % Aspiration Attempted: Yes   Patient tolerance:  Patient tolerated the procedure well with no immediate complications Large Joint Inj Date/Time: 03/15/2017 10:49 AM Performed by: Pollyann SavoyEVESHWAR, Martasia Talamante Authorized by: Pollyann SavoyEVESHWAR, Klee Kolek   Consent Given by:  Patient Site marked: the procedure site was marked   Timeout: prior to procedure the correct patient, procedure, and site was verified   Indications:  Pain and joint swelling Location:  Knee Site:  R knee Prep: patient was prepped and draped in usual sterile fashion   Needle Size:  27 G Needle Length:  1.5 inches Approach:  Medial Ultrasound Guidance: No   Fluoroscopic Guidance: No   Arthrogram: No   Medications:  20 mg Sodium Hyaluronate 20 MG/2ML; 1.5 mL lidocaine 2 % Aspiration Attempted: Yes   Patient tolerance:  Patient tolerated the procedure well with no immediate complications  Pollyann SavoyShaili Brystal Kildow, MD

## 2017-03-21 ENCOUNTER — Ambulatory Visit (INDEPENDENT_AMBULATORY_CARE_PROVIDER_SITE_OTHER): Payer: Medicare Other | Admitting: Rheumatology

## 2017-03-21 DIAGNOSIS — M170 Bilateral primary osteoarthritis of knee: Secondary | ICD-10-CM | POA: Diagnosis not present

## 2017-03-21 MED ORDER — LIDOCAINE HCL 2 % IJ SOLN
1.5000 mL | INTRAMUSCULAR | Status: AC | PRN
  Administered 2017-03-15: 1.5 mL

## 2017-03-21 MED ORDER — SODIUM HYALURONATE (VISCOSUP) 20 MG/2ML IX SOSY
20.0000 mg | PREFILLED_SYRINGE | INTRA_ARTICULAR | Status: AC | PRN
  Administered 2017-03-15: 20 mg via INTRA_ARTICULAR

## 2017-03-25 DIAGNOSIS — M170 Bilateral primary osteoarthritis of knee: Secondary | ICD-10-CM

## 2017-03-25 NOTE — Progress Notes (Addendum)
   Procedure Note  Patient: Isabel SorrowSandra C Narvaiz             Date of Birth: 06/03/1946           MRN: [id number]             Visit Date: 03/28/2017  Procedures: Visit Diagnoses: Primary osteoarthritis of both knees Euflexxa #2 bilateral knees   Large Joint Inj Date/Time: 03/25/2017 10:14 AM Performed by: Pollyann SavoyEVESHWAR, SHAILI Authorized by: Pollyann SavoyEVESHWAR, SHAILI   Consent Given by:  Patient Site marked: the procedure site was marked   Timeout: prior to procedure the correct patient, procedure, and site was verified   Indications:  Pain Location:  Knee Site:  R knee Prep: patient was prepped and draped in usual sterile fashion   Needle Size:  27 G Needle Length:  1.5 inches Ultrasound Guidance: No   Fluoroscopic Guidance: No   Arthrogram: No   Medications:  1.5 mL lidocaine 2 %; 20 mg Sodium Hyaluronate 20 MG/2ML Aspiration Attempted: Yes   Patient tolerance:  Patient tolerated the procedure well with no immediate complications Large Joint Inj Date/Time: 03/25/2017 10:14 AM Performed by: Pollyann SavoyEVESHWAR, SHAILI Authorized by: Pollyann SavoyEVESHWAR, SHAILI   Consent Given by:  Patient Site marked: the procedure site was marked   Timeout: prior to procedure the correct patient, procedure, and site was verified   Indications:  Pain Location:  Knee Site:  L knee Prep: patient was prepped and draped in usual sterile fashion   Needle Size:  27 G Needle Length:  1.5 inches Ultrasound Guidance: No   Fluoroscopic Guidance: No   Arthrogram: No   Medications:  1.5 mL lidocaine 2 %; 20 mg Sodium Hyaluronate 20 MG/2ML Aspiration Attempted: Yes   Patient tolerance:  Patient tolerated the procedure well with no immediate complications   Pollyann SavoyShaili Deveshwar, MD

## 2017-03-28 ENCOUNTER — Ambulatory Visit (INDEPENDENT_AMBULATORY_CARE_PROVIDER_SITE_OTHER): Payer: Medicare Other | Admitting: Rheumatology

## 2017-03-28 DIAGNOSIS — M170 Bilateral primary osteoarthritis of knee: Secondary | ICD-10-CM | POA: Diagnosis not present

## 2017-03-28 MED ORDER — LIDOCAINE HCL 2 % IJ SOLN
1.5000 mL | INTRAMUSCULAR | Status: AC | PRN
Start: 2017-03-25 — End: 2017-03-25
  Administered 2017-03-25: 1.5 mL

## 2017-03-28 MED ORDER — SODIUM HYALURONATE (VISCOSUP) 20 MG/2ML IX SOSY
20.0000 mg | PREFILLED_SYRINGE | INTRA_ARTICULAR | Status: AC | PRN
  Administered 2017-03-25: 20 mg via INTRA_ARTICULAR

## 2017-03-28 MED ORDER — LIDOCAINE HCL 2 % IJ SOLN
1.5000 mL | INTRAMUSCULAR | Status: AC | PRN
  Administered 2017-03-25: 1.5 mL

## 2017-03-30 ENCOUNTER — Telehealth: Payer: Self-pay | Admitting: Obstetrics and Gynecology

## 2017-03-30 NOTE — Telephone Encounter (Signed)
Left message regarding upcoming appointment has been canceled and needs to be rescheduled. °

## 2017-03-31 DIAGNOSIS — M170 Bilateral primary osteoarthritis of knee: Secondary | ICD-10-CM

## 2017-03-31 NOTE — Progress Notes (Addendum)
   Procedure Note  Patient: [name]             Date of Birth: Nov 09, 1945           MRN: [id number]             Visit Date: 04/04/2017  Procedures: Visit Diagnoses: Bilateral primary osteoarthritis of knee - Plan: Large Joint Injection/Arthrocentesis, Large Joint Injection/Arthrocentesis, Sodium Hyaluronate SOSY 20 mg, Sodium Hyaluronate SOSY 20 mg, lidocaine (XYLOCAINE) 2 % (with pres) injection 1.5 mL, lidocaine (XYLOCAINE) 2 % (with pres) injection 1.5 mL Euflexxa #3 bilateral  Large Joint Inj Date/Time: 03/31/2017 8:43 AM Performed by: Pollyann SavoyEVESHWAR, SHAILI Authorized by: Pollyann SavoyEVESHWAR, SHAILI   Consent Given by:  Patient Site marked: the procedure site was marked   Timeout: prior to procedure the correct patient, procedure, and site was verified   Indications:  Pain Location:  Knee Site:  R knee Prep: patient was prepped and draped in usual sterile fashion   Needle Size:  27 G Needle Length:  1.5 inches Ultrasound Guidance: No   Fluoroscopic Guidance: No   Arthrogram: No   Medications:  1.5 mL lidocaine 2 %; 20 mg Sodium Hyaluronate 20 MG/2ML Aspiration Attempted: Yes   Patient tolerance:  Patient tolerated the procedure well with no immediate complications Large Joint Inj Date/Time: 03/31/2017 8:44 AM Performed by: Pollyann SavoyEVESHWAR, SHAILI Authorized by: Pollyann SavoyEVESHWAR, SHAILI   Consent Given by:  Patient Site marked: the procedure site was marked   Timeout: prior to procedure the correct patient, procedure, and site was verified   Indications:  Pain Location:  Knee Site:  L knee Prep: patient was prepped and draped in usual sterile fashion   Needle Size:  27 G Needle Length:  1.5 inches Ultrasound Guidance: No   Fluoroscopic Guidance: No   Arthrogram: No   Medications:  1.5 mL lidocaine 2 %; 20 mg Sodium Hyaluronate 20 MG/2ML Aspiration Attempted: Yes   Patient tolerance:  Patient tolerated the procedure well with no immediate complications   Pollyann SavoyShaili Deveshwar, MD

## 2017-04-04 ENCOUNTER — Ambulatory Visit (INDEPENDENT_AMBULATORY_CARE_PROVIDER_SITE_OTHER): Payer: Medicare Other | Admitting: Rheumatology

## 2017-04-04 DIAGNOSIS — M170 Bilateral primary osteoarthritis of knee: Secondary | ICD-10-CM

## 2017-04-04 MED ORDER — SODIUM HYALURONATE (VISCOSUP) 20 MG/2ML IX SOSY
20.0000 mg | PREFILLED_SYRINGE | INTRA_ARTICULAR | Status: AC | PRN
  Administered 2017-03-31: 20 mg via INTRA_ARTICULAR

## 2017-04-04 MED ORDER — LIDOCAINE HCL 2 % IJ SOLN
1.5000 mL | INTRAMUSCULAR | Status: AC | PRN
  Administered 2017-03-31: 1.5 mL

## 2017-04-12 ENCOUNTER — Other Ambulatory Visit: Payer: Self-pay | Admitting: Nurse Practitioner

## 2017-04-12 ENCOUNTER — Ambulatory Visit
Admission: RE | Admit: 2017-04-12 | Discharge: 2017-04-12 | Disposition: A | Discharge status: Home / Self Care | Payer: Medicare Other | Source: Ambulatory Visit | Attending: Nurse Practitioner | Admitting: Nurse Practitioner

## 2017-04-12 DIAGNOSIS — M47816 Spondylosis without myelopathy or radiculopathy, lumbar region: Secondary | ICD-10-CM

## 2017-04-12 DIAGNOSIS — M542 Cervicalgia: Secondary | ICD-10-CM

## 2017-04-27 ENCOUNTER — Other Ambulatory Visit: Payer: Self-pay | Admitting: Nurse Practitioner

## 2017-04-27 DIAGNOSIS — M47816 Spondylosis without myelopathy or radiculopathy, lumbar region: Secondary | ICD-10-CM

## 2017-04-27 DIAGNOSIS — M5126 Other intervertebral disc displacement, lumbar region: Secondary | ICD-10-CM

## 2017-05-10 ENCOUNTER — Ambulatory Visit
Admission: RE | Admit: 2017-05-10 | Discharge: 2017-05-10 | Disposition: A | Discharge status: Home / Self Care | Payer: Medicare Other | Source: Ambulatory Visit | Attending: Nurse Practitioner | Admitting: Nurse Practitioner

## 2017-05-10 DIAGNOSIS — M47816 Spondylosis without myelopathy or radiculopathy, lumbar region: Secondary | ICD-10-CM

## 2017-05-10 DIAGNOSIS — M5126 Other intervertebral disc displacement, lumbar region: Secondary | ICD-10-CM

## 2017-05-10 MED ORDER — GADOBENATE DIMEGLUMINE 529 MG/ML IV SOLN
18.0000 mL | Freq: Once | INTRAVENOUS | Status: AC | PRN
  Administered 2017-05-10: 18 mL via INTRAVENOUS

## 2017-05-24 ENCOUNTER — Telehealth: Payer: Self-pay | Admitting: Rheumatology

## 2017-05-24 NOTE — Telephone Encounter (Signed)
[facility] on patient. They were going to draw labs and patient told them that we drew labs about 6 weeks ago.  Fax# [phone] attn: Kathie RhodesBetty Phone#[phone]

## 2017-05-25 NOTE — Telephone Encounter (Signed)
Sent labs to her PCP Dr Jeannetta NapElkins, but this request was from Neurosurgeon, sent there as well, Amy   Neurosurgeon referred to us so okay to release lab results

## 2017-05-27 ENCOUNTER — Encounter (HOSPITAL_BASED_OUTPATIENT_CLINIC_OR_DEPARTMENT_OTHER): Payer: Self-pay | Admitting: *Deleted

## 2017-05-30 ENCOUNTER — Encounter (HOSPITAL_BASED_OUTPATIENT_CLINIC_OR_DEPARTMENT_OTHER)
Admission: RE | Admit: 2017-05-30 | Discharge: 2017-05-30 | Disposition: A | Discharge status: Home / Self Care | Payer: Medicare Other | Source: Ambulatory Visit | Attending: Ophthalmology | Admitting: Ophthalmology

## 2017-05-30 ENCOUNTER — Ambulatory Visit: Payer: Self-pay | Admitting: Ophthalmology

## 2017-05-30 DIAGNOSIS — I10 Essential (primary) hypertension: Secondary | ICD-10-CM | POA: Insufficient documentation

## 2017-05-30 DIAGNOSIS — Z01810 Encounter for preprocedural cardiovascular examination: Secondary | ICD-10-CM | POA: Diagnosis present

## 2017-05-30 NOTE — H&P (Signed)
Date of examination:  05-19-17  Indication for surgery: to straighten the eyes and relieve diplopia  Pertinent past medical history:  Past Medical History:  Diagnosis Date  . Bilateral ovarian cysts 09/12/14   bilateral simple ovarian cysts  . 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 Fibromyalgia   . HTN (hypertension)    under control with meds., has been on med. x 20 yr.  . Hyperlipidemia   . Hypothyroid   . IBS (irritable bowel syndrome)   . Impingement syndrome of right shoulder 07/2013  . PONV (postoperative nausea and vomiting)   . Rupture of right rotator cuff 07/2013  . VIN I (vulvar intraepithelial neoplasia I) 2017   this is from a perineal/vulvar biopsy.  . Vitamin B12 deficiency   . Wears partial dentures    lower    Pertinent ocular history:  2 previous strab surgeries decades ago, details unknown, initially for ET by hx.  RLR recess 8 adj to 2, RMR advance 7 by me '09.  Now with recurrent ET/diplopia x 9-12 months  Pertinent family history:  Family History  Problem Relation Age of Onset  . Aortic aneurysm Father   . Hypertension Father   . Coronary artery disease Mother 55  . Hypertension Mother   . Hyperlipidemia Brother        brain tumor unknown if malignant    General:  Healthy appearing patient in no distress.    Eyes:    Acuity [place]  OD 20/150  OS 20/25  External: Within normal limits x L face turn    Anterior segment: Within normal limits x healed conj scars  Motility:   ET=10 in primary, 18 in R gaze, 0-??1- RLR,LHT 2, ET'10  Fundus: deferred  Refraction:  Minimal OU  Heart: Regular rate and rhythm without murmur     Lungs: Clear to auscultation     Impression:Esotropia, recurrent, incomitant, with diplopia and L face turn  Plan: Explore LMR.  If at/near insertion, recess LMR.  If not, recess previously advanced RMR and Faden previously recessed LMR  Branden Vine O

## 2017-06-02 ENCOUNTER — Telehealth: Payer: Self-pay | Admitting: Radiology

## 2017-06-02 NOTE — Telephone Encounter (Signed)
Called left message for pt to call me back, Dr Rolan Bucco office will start her on the prednisone, and Dr Corliss Skains wants me to discuss with patient

## 2017-06-02 NOTE — Telephone Encounter (Signed)
Dr Rolan Bucco NP has called to advise patient is having upper ext and lower ext weakness, they are concerned about Sed Rate, which has gone up to 42/ Dr Corliss Skains has discussed with her and the NP will start patient on  prednisone, and taper by  every month.

## 2017-06-03 ENCOUNTER — Ambulatory Visit (HOSPITAL_BASED_OUTPATIENT_CLINIC_OR_DEPARTMENT_OTHER): Payer: Medicare Other | Admitting: Anesthesiology

## 2017-06-03 ENCOUNTER — Encounter (HOSPITAL_BASED_OUTPATIENT_CLINIC_OR_DEPARTMENT_OTHER): Payer: Self-pay

## 2017-06-03 ENCOUNTER — Encounter (HOSPITAL_BASED_OUTPATIENT_CLINIC_OR_DEPARTMENT_OTHER)
Admission: RE | Disposition: A | Discharge status: Home / Self Care | Payer: Self-pay | Source: Ambulatory Visit | Attending: Ophthalmology

## 2017-06-03 ENCOUNTER — Ambulatory Visit (HOSPITAL_BASED_OUTPATIENT_CLINIC_OR_DEPARTMENT_OTHER)
Admission: RE | Admit: 2017-06-03 | Discharge: 2017-06-03 | Disposition: A | Discharge status: Home / Self Care | Payer: Medicare Other | Source: Ambulatory Visit | Attending: Ophthalmology | Admitting: Ophthalmology

## 2017-06-03 DIAGNOSIS — I10 Essential (primary) hypertension: Secondary | ICD-10-CM | POA: Diagnosis not present

## 2017-06-03 DIAGNOSIS — E785 Hyperlipidemia, unspecified: Secondary | ICD-10-CM | POA: Insufficient documentation

## 2017-06-03 DIAGNOSIS — K589 Irritable bowel syndrome without diarrhea: Secondary | ICD-10-CM | POA: Insufficient documentation

## 2017-06-03 DIAGNOSIS — E039 Hypothyroidism, unspecified: Secondary | ICD-10-CM | POA: Diagnosis not present

## 2017-06-03 DIAGNOSIS — Z79899 Other long term (current) drug therapy: Secondary | ICD-10-CM | POA: Insufficient documentation

## 2017-06-03 DIAGNOSIS — H532 Diplopia: Secondary | ICD-10-CM | POA: Diagnosis not present

## 2017-06-03 DIAGNOSIS — H5000 Unspecified esotropia: Secondary | ICD-10-CM | POA: Diagnosis present

## 2017-06-03 DIAGNOSIS — M797 Fibromyalgia: Secondary | ICD-10-CM | POA: Insufficient documentation

## 2017-06-03 HISTORY — PX: STRABISMUS SURGERY: SHX218

## 2017-06-03 SURGERY — REPAIR STRABISMUS
Anesthesia: General | Site: Eye | Laterality: Right

## 2017-06-03 MED ORDER — MIDAZOLAM HCL 2 MG/2ML IJ SOLN
INTRAMUSCULAR | Status: AC
  Filled 2017-06-03: qty 2

## 2017-06-03 MED ORDER — FENTANYL CITRATE (PF) 100 MCG/2ML IJ SOLN: 25.0000 ug | INTRAMUSCULAR | Status: DC | PRN

## 2017-06-03 MED ORDER — FENTANYL CITRATE (PF) 100 MCG/2ML IJ SOLN
50.0000 ug | INTRAMUSCULAR | Status: DC | PRN
  Administered 2017-06-03: 25 ug via INTRAVENOUS
  Administered 2017-06-03: 100 ug via INTRAVENOUS

## 2017-06-03 MED ORDER — LACTATED RINGERS IV SOLN
INTRAVENOUS | Status: DC
  Administered 2017-06-03: 08:00:00 via INTRAVENOUS

## 2017-06-03 MED ORDER — LIDOCAINE 2% (20 MG/ML) 5 ML SYRINGE
INTRAMUSCULAR | Status: DC | PRN
  Administered 2017-06-03: 60 mg via INTRAVENOUS

## 2017-06-03 MED ORDER — PROMETHAZINE HCL 25 MG/ML IJ SOLN: 6.2500 mg | INTRAMUSCULAR | Status: DC | PRN

## 2017-06-03 MED ORDER — SCOPOLAMINE 1 MG/3DAYS TD PT72: 1.0000 | MEDICATED_PATCH | Freq: Once | TRANSDERMAL | Status: DC | PRN

## 2017-06-03 MED ORDER — PROPOFOL 10 MG/ML IV BOLUS
INTRAVENOUS | Status: DC | PRN
  Administered 2017-06-03: 150 mg via INTRAVENOUS

## 2017-06-03 MED ORDER — TOBRAMYCIN-DEXAMETHASONE 0.3-0.1 % OP OINT: 1.0000 "application " | TOPICAL_OINTMENT | Freq: Two times a day (BID) | OPHTHALMIC | 0 refills | Status: DC

## 2017-06-03 MED ORDER — GLYCOPYRROLATE 0.2 MG/ML IJ SOLN
INTRAMUSCULAR | Status: DC | PRN
  Administered 2017-06-03: .4 mg via INTRAVENOUS

## 2017-06-03 MED ORDER — LIDOCAINE 2% (20 MG/ML) 5 ML SYRINGE
INTRAMUSCULAR | Status: AC
  Filled 2017-06-03: qty 5

## 2017-06-03 MED ORDER — DEXAMETHASONE SODIUM PHOSPHATE 10 MG/ML IJ SOLN
INTRAMUSCULAR | Status: AC
  Filled 2017-06-03: qty 1

## 2017-06-03 MED ORDER — DEXAMETHASONE SODIUM PHOSPHATE 4 MG/ML IJ SOLN
INTRAMUSCULAR | Status: DC | PRN
  Administered 2017-06-03: 10 mg via INTRAVENOUS

## 2017-06-03 MED ORDER — ONDANSETRON HCL 4 MG/2ML IJ SOLN
INTRAMUSCULAR | Status: AC
  Filled 2017-06-03: qty 2

## 2017-06-03 MED ORDER — TOBRAMYCIN-DEXAMETHASONE 0.3-0.1 % OP OINT
TOPICAL_OINTMENT | OPHTHALMIC | Status: DC | PRN
  Administered 2017-06-03: 1 via OPHTHALMIC

## 2017-06-03 MED ORDER — MIDAZOLAM HCL 2 MG/2ML IJ SOLN
1.0000 mg | INTRAMUSCULAR | Status: DC | PRN
  Administered 2017-06-03: 2 mg via INTRAVENOUS

## 2017-06-03 MED ORDER — FENTANYL CITRATE (PF) 100 MCG/2ML IJ SOLN
INTRAMUSCULAR | Status: AC
  Filled 2017-06-03: qty 2

## 2017-06-03 MED ORDER — ONDANSETRON HCL 4 MG/2ML IJ SOLN
INTRAMUSCULAR | Status: DC | PRN
  Administered 2017-06-03: 4 mg via INTRAVENOUS

## 2017-06-03 SURGICAL SUPPLY — 29 items
APPLICATOR COTTON TIP 6IN STRL (MISCELLANEOUS) ×12 IMPLANT
APPLICATOR DR MATTHEWS STRL (MISCELLANEOUS) ×3 IMPLANT
BANDAGE EYE OVAL (MISCELLANEOUS) IMPLANT
CAUTERY EYE LOW TEMP 1300F FIN (OPHTHALMIC RELATED) IMPLANT
COVER BACK TABLE 60X90IN (DRAPES) ×3 IMPLANT
COVER MAYO STAND STRL (DRAPES) ×3 IMPLANT
DRAPE SURG 17X23 STRL (DRAPES) ×6 IMPLANT
DRAPE U-SHAPE 76X120 STRL (DRAPES) ×3 IMPLANT
GLOVE BIO SURGEON STRL SZ 6.5 (GLOVE) ×6 IMPLANT
GLOVE BIOGEL M STRL SZ7.5 (GLOVE) ×6 IMPLANT
GOWN STRL REUS W/ TWL LRG LVL3 (GOWN DISPOSABLE) ×2 IMPLANT
GOWN STRL REUS W/TWL LRG LVL3 (GOWN DISPOSABLE) ×1
GOWN STRL REUS W/TWL XL LVL3 (GOWN DISPOSABLE) ×6 IMPLANT
NS IRRIG 1000ML POUR BTL (IV SOLUTION) ×3 IMPLANT
PACK BASIN DAY SURGERY FS (CUSTOM PROCEDURE TRAY) ×3 IMPLANT
SHEET MEDIUM DRAPE 40X70 STRL (DRAPES) IMPLANT
SPEAR EYE SURG WECK-CEL (MISCELLANEOUS) ×9 IMPLANT
STRIP CLOSURE SKIN 1/4X4 (GAUZE/BANDAGES/DRESSINGS) IMPLANT
SUT 6 0 SILK T G140 8DA (SUTURE) ×3 IMPLANT
SUT MERSILENE 6-0 18IN S14 8MM (SUTURE)
SUT PLAIN 6 0 TG1408 (SUTURE) ×3 IMPLANT
SUT SILK 4 0 C 3 735G (SUTURE) IMPLANT
SUT VICRYL 6 0 S 28 (SUTURE) IMPLANT
SUT VICRYL ABS 6-0 S29 18IN (SUTURE) ×3 IMPLANT
SUTURE MERSLN 6-0 18IN S14 8MM (SUTURE) IMPLANT
SYR 10ML LL (SYRINGE) ×3 IMPLANT
SYR TB 1ML LL NO SAFETY (SYRINGE) ×3 IMPLANT
TOWEL OR 17X24 6PK STRL BLUE (TOWEL DISPOSABLE) ×3 IMPLANT
TRAY DSU PREP LF (CUSTOM PROCEDURE TRAY) ×3 IMPLANT

## 2017-06-03 NOTE — H&P (View-Only) (Signed)
Date of examination:  05-19-17  Indication for surgery: to straighten the eyes and relieve diplopia  Pertinent past medical history:  Past Medical History:  Diagnosis Date  . Bilateral ovarian cysts 09/12/14   bilateral simple ovarian cysts  . 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 Fibromyalgia   . HTN (hypertension)    under control with meds., has been on med. x 20 yr.  . Hyperlipidemia   . Hypothyroid   . IBS (irritable bowel syndrome)   . Impingement syndrome of right shoulder 07/2013  . PONV (postoperative nausea and vomiting)   . Rupture of right rotator cuff 07/2013  . VIN I (vulvar intraepithelial neoplasia I) 2017   this is from a perineal/vulvar biopsy.  . Vitamin B12 deficiency   . Wears partial dentures    lower    Pertinent ocular history:  2 previous strab surgeries decades ago, details unknown, initially for ET by hx.  RLR recess 8 adj to 2, RMR advance 7 by me '09.  Now with recurrent ET/diplopia x 9-12 months  Pertinent family history:  Family History  Problem Relation Age of Onset  . Aortic aneurysm Father   . Hypertension Father   . Coronary artery disease Mother 70  . Hypertension Mother   . Hyperlipidemia Brother        brain tumor unknown if malignant    General:  Healthy appearing patient in no distress.    Eyes:    Acuity [place]  OD 20/150  OS 20/25  External: Within normal limits x L face turn    Anterior segment: Within normal limits x healed conj scars  Motility:   ET=10 in primary, 18 in R gaze, 0-??1- RLR,LHT 2, ET'10  Fundus: deferred  Refraction:  Minimal OU  Heart: Regular rate and rhythm without murmur     Lungs: Clear to auscultation     Impression:Esotropia, recurrent, incomitant, with diplopia and L face turn  Plan: Explore LMR.  If at/near insertion, recess LMR.  If not, recess previously advanced RMR and Faden previously recessed LMR  Melea Prezioso O  

## 2017-06-03 NOTE — Op Note (Signed)
06/03/2017  10:31 AM  PATIENT:  [name]    PRE-OPERATIVE DIAGNOSIS:  ESOTROPIA, recurrent  POST-OPERATIVE DIAGNOSIS:  same  PROCEDURE:  Right medial rectus muscle recession, 3.0 mm  SURGEON:  [name], MD  ANESTHESIA:   General  COMPLICATIONS: none  OPERATIVE PROCEDURE: After routine preoperative evaluation including informed consent, the patient was taken to the operating room where she was identified by me. General anesthesia was induced without difficulty after placement of appropriate monitors. The patient was prepped and draped in standard sterile fashion. A lid speculum was placed in each eye. There was no resistance to forced abduction of the left eye. It was felt that there was slight (not marked) resistance to forced abduction of the right eye. It was therefore elected to limit surgery to recession of the right medial rectus muscle, which had been previously advanced at her most recent surgery.  Conjunctiva was noted to be elevated and injected beginning about 4 mm posterior to the nasal limbus in the right eye. An incision concentric with the limbus was made through conjunctiva at the anterior border of this elevated zone. This extended for approximately 1-2 clock hours extent. A relaxing incision was made in the superonasal and inferonasal quadrant. The right medial rectus muscle was engaged on a series of muscle hooks and carefully cleared of its surrounding fascial attachments and scar tissue. The tendon was secured with a double-armed 6-0 Vicryl suture, with a locking bite at each border of the muscle, 1 mm from the insertion. The muscle was disinserted. The current insertion was measured to be 8.5 mm posterior to the limbus. The muscle was reattached to sclera at a measured distance of 3.0 mm posterior to the current insertion, using direct scleral passes in crossed swords fashion. The suture ends were tied securely after the position of the muscle been checked and  found to be accurate. The conjunctival flap was thinned with Wescott scissors, taking care not greater buttonholing conjunctiva. Conjunctiva was reapposed to the limbus with 6-0 plain gut sutures, leaving the conjunctiva recessed 5 mm posterior to the limbus. Tobradex ophthalmic ointment was placed in the right eye. The patient was awakened without difficulty and taken to the recovery room in stable condition, having suffered no intraoperative or immediate postoperative complications.  [name], MD

## 2017-06-03 NOTE — Anesthesia Preprocedure Evaluation (Addendum)
Anesthesia Evaluation  Patient identified by MRN, date of birth, ID band Patient awake    Reviewed: Allergy & Precautions, NPO status , Patient's Chart, lab work & pertinent test results  History of Anesthesia Complications (+) PONV  Airway Mallampati: II  TM Distance: >3 FB Neck ROM: Full    Dental  (+) Dental Advisory Given   Pulmonary neg pulmonary ROS,    breath sounds clear to auscultation- rhonchi       Cardiovascular hypertension, Pt. on medications  Rhythm:Regular Rate:Normal     Neuro/Psych  Neuromuscular disease    GI/Hepatic negative GI ROS, Neg liver ROS,   Endo/Other  Hypothyroidism   Renal/GU negative Renal ROS     Musculoskeletal  (+) Arthritis , Fibromyalgia -  Abdominal   Peds  Hematology negative hematology ROS (+)   Anesthesia Other Findings   Reproductive/Obstetrics                            Anesthesia Physical Anesthesia Plan  ASA: II  Anesthesia Plan: General   Post-op Pain Management:    Induction: Intravenous  PONV Risk Score and Plan: 4 or greater and Ondansetron, Dexamethasone and Treatment may vary due to age or medical condition  Airway Management Planned: LMA  Additional Equipment:   Intra-op Plan:   Post-operative Plan: Extubation in OR  Informed Consent: I have reviewed the patients History and Physical, chart, labs and discussed the procedure including the risks, benefits and alternatives for the proposed anesthesia with the patient or authorized representative who has indicated his/her understanding and acceptance.   Dental advisory given  Plan Discussed with: CRNA  Anesthesia Plan Comments:        Anesthesia Quick Evaluation

## 2017-06-03 NOTE — Interval H&P Note (Signed)
History and Physical Interval Note:  06/03/2017 9:01 AM  [name]  has presented today for surgery, with the diagnosis of ESOTROPIA  The various methods of treatment have been discussed with the patient and family. After consideration of risks, benefits and other options for treatment, the patient has consented to  Procedure(s): REPAIR STRABISMUS BILATERAL (Bilateral) as a surgical intervention .  The patient's history has been reviewed, patient examined, no change in status, stable for surgery.  I have reviewed the patient's chart and labs.  Questions were answered to the patient's satisfaction.     [name]

## 2017-06-03 NOTE — Anesthesia Postprocedure Evaluation (Signed)
Anesthesia Post Note  Patient: [name]  Procedure(s) Performed: Procedure(s) (LRB): REPAIR STRABISMUS RIGHT EYE (Right)     Patient location during evaluation: PACU Anesthesia Type: General Level of consciousness: awake and alert Pain management: pain level controlled Vital Signs Assessment: post-procedure vital signs reviewed and stable Respiratory status: spontaneous breathing, nonlabored ventilation, respiratory function stable and patient connected to nasal cannula oxygen Cardiovascular status: blood pressure returned to baseline and stable Postop Assessment: no apparent nausea or vomiting Anesthetic complications: no    Last Vitals:  Vitals:   06/03/17 1100 06/03/17 1115  BP: (!) 146/97 (!) 156/90  Pulse: 81 79  Resp: 14 16  Temp:  36.5 C  SpO2: 94% 95%    Last Pain:  Vitals:   06/03/17 1115  TempSrc:   PainSc: 0-No pain                 Kennieth Rad

## 2017-06-03 NOTE — Discharge Instructions (Signed)
°  Post Anesthesia Home Care Instructions  Activity: Get plenty of rest for the remainder of the day. A responsible individual must stay with you for 24 hours following the procedure.  For the next 24 hours, DO NOT: -Drive a car -Advertising copywriter -Drink alcoholic beverages -Take any medication unless instructed by your physician -Make any legal decisions or sign important papers.  Meals: Start with liquid foods such as gelatin or soup. Progress to regular foods as tolerated. Avoid greasy, spicy, heavy foods. If nausea and/or vomiting occur, drink only clear liquids until the nausea and/or vomiting subsides. Call your physician if vomiting continues.  Special Instructions/Symptoms: Your throat may feel dry or sore from the anesthesia or the breathing tube placed in your throat during surgery. If this causes discomfort, gargle with warm salt water. The discomfort should disappear within 24 hours.  If you had a scopolamine patch placed behind your ear for the management of post- operative nausea and/or vomiting:  1. The medication in the patch is effective for 72 hours, after which it should be removed.  Wrap patch in a tissue and discard in the trash. Wash hands thoroughly with soap and water. 2. You may remove the patch earlier than 72 hours if you experience unpleasant side effects which may include dry mouth, dizziness or visual disturbances. 3. Avoid touching the patch. Wash your hands with soap and water after contact with the patch.    Dr. Roxy Cedar Postop Instructions:    Diet: Clear liquids, advance to soft foods then regular diet as tolerated by this evening.  Pain control:   1)  Ibuprofen 600 mg by mouth every 6-8 hours as needed for pain  2)  Ice pack/cold compress to operated eye(s) as desired  Eye medications:  Tobradex or Zylet eye ointment 1/2 inch in operated eye(s) twice a day if directed to do so by Dr. Maple Hudson  Activity: No swimming for 1 week.  It is OK to let water  run over the face and eyes while showering or taking a bath, even during the first week.  No other restriction on exercise or activity.  If there is a patch on one eye, leave it in place until seen in Dr. Roxy Cedar office this afternoon for suture adjustment.  If there is no patch, you do not need to come to the office this afternoon; just come for your scheduled postop appointment.  Call Dr. Roxy Cedar office [phone] with any problems or concerns.

## 2017-06-03 NOTE — Anesthesia Procedure Notes (Signed)
Procedure Name: LMA Insertion Performed by: Myracle Febres W Pre-anesthesia Checklist: Patient identified, Emergency Drugs available, Suction available and Patient being monitored Patient Re-evaluated:Patient Re-evaluated prior to induction Oxygen Delivery Method: Circle system utilized Preoxygenation: Pre-oxygenation with 100% oxygen Induction Type: IV induction Ventilation: Mask ventilation without difficulty LMA: LMA flexible inserted LMA Size: 4.0 Number of attempts: 1 Placement Confirmation: positive ETCO2 Tube secured with: Tape Dental Injury: Teeth and Oropharynx as per pre-operative assessment        

## 2017-06-03 NOTE — Transfer of Care (Signed)
Immediate Anesthesia Transfer of Care Note  Patient: [name]  Procedure(s) Performed: Procedure(s): REPAIR STRABISMUS RIGHT EYE (Right)  Patient Location: PACU  Anesthesia Type:General  Level of Consciousness: awake and sedated  Airway & Oxygen Therapy: Patient Spontanous Breathing and Patient connected to face mask oxygen  Post-op Assessment: Report given to RN and Post -op Vital signs reviewed and stable  Post vital signs: Reviewed and stable  Last Vitals:  Vitals:   06/03/17 0732  BP: (!) 152/82  Pulse: 74  Resp: 18  Temp: 36.7 C  SpO2: 98%    Last Pain:  Vitals:   06/03/17 0732  TempSrc: Oral         Complications: No apparent anesthesia complications

## 2017-06-06 ENCOUNTER — Telehealth: Payer: Self-pay | Admitting: Rheumatology

## 2017-06-06 ENCOUNTER — Encounter (HOSPITAL_BASED_OUTPATIENT_CLINIC_OR_DEPARTMENT_OTHER): Payer: Self-pay | Admitting: Ophthalmology

## 2017-06-06 NOTE — Telephone Encounter (Signed)
Patient advised that Dr. Temple Pacini NP contacted the office and spoke with Dr. Corliss Skains about sending in the prescription for Prednisone 10 mg and tapering it by 1 mg every month. Patient verbalized understanding.

## 2017-06-06 NOTE — Telephone Encounter (Signed)
Patient returning your call from Thursday 9/20.

## 2017-06-13 ENCOUNTER — Ambulatory Visit: Payer: Medicare Other | Admitting: Nurse Practitioner

## 2017-06-27 ENCOUNTER — Ambulatory Visit (INDEPENDENT_AMBULATORY_CARE_PROVIDER_SITE_OTHER): Payer: Medicare Other | Admitting: Obstetrics and Gynecology

## 2017-06-27 ENCOUNTER — Encounter: Payer: Self-pay | Admitting: Obstetrics and Gynecology

## 2017-06-27 ENCOUNTER — Other Ambulatory Visit (HOSPITAL_COMMUNITY)
Admission: RE | Admit: 2017-06-27 | Discharge: 2017-06-27 | Disposition: A | Discharge status: Home / Self Care | Payer: Medicare Other | Source: Ambulatory Visit | Attending: Obstetrics and Gynecology | Admitting: Obstetrics and Gynecology

## 2017-06-27 VITALS — BP 120/62 | HR 64 | Resp 16 | Ht 68.0 in | Wt 194.0 lb

## 2017-06-27 DIAGNOSIS — N9089 Other specified noninflammatory disorders of vulva and perineum: Secondary | ICD-10-CM | POA: Diagnosis not present

## 2017-06-27 DIAGNOSIS — Z01419 Encounter for gynecological examination (general) (routine) without abnormal findings: Secondary | ICD-10-CM

## 2017-06-27 DIAGNOSIS — N898 Other specified noninflammatory disorders of vagina: Secondary | ICD-10-CM | POA: Insufficient documentation

## 2017-06-27 DIAGNOSIS — N83202 Unspecified ovarian cyst, left side: Secondary | ICD-10-CM | POA: Diagnosis not present

## 2017-06-27 NOTE — Progress Notes (Signed)
71 y.o. [id number] Married Caucasian female here for annual exam.     Pap last year showed ASCUS and negative HR HPV.  Vulvar bx VIN I.  Location was the left inferior junction of labia with perineum.  Has simple left ovarian cyst being followed yearly.  Right ovary has a follicular type cyst. CA125 8 on 06/02/15.  Having polymyalgia rheumatica. Back on prednisone.   PCP: Dr Jeannetta Nap   Spine specialist:  Dr. Channing Mutters Rheumatologist:  Dr. Erlene Senters.  Patient's last menstrual period was 09/13/1984 (approximate).           Sexually active: No.   Goes to the Club - does water exercises. The current method of family planning is post menopausal status.    Exercising: Yes.    walking Smoker:  no  Health Maintenance: Pap:  06-03-16 ASCUS.  HR HPV negative. History of abnormal Pap:  yes MMG:  12-20-16 WNL - TBC. Colonoscopy: 2018 polyps repeat in 5 yrs  BMD:   unsure TDaP:  2014 Gardasil:   no HIV: unsure Hep C: 04-05-16 Neg Screening Labs: PCP does labs    reports that she has never smoked. She has never used smokeless tobacco. She reports that she does not drink alcohol or use drugs.  Past Medical History:  Diagnosis Date  . Bilateral ovarian cysts 09/12/14   bilateral simple ovarian cysts  . 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 Fibromyalgia   . HTN (hypertension)    under control with meds., has been on med. x 20 yr.  . Hyperlipidemia   . Hypothyroid   . IBS (irritable bowel syndrome)   . Impingement syndrome of right shoulder 07/2013  . PONV (postoperative nausea and vomiting)   . Rupture of right rotator cuff 07/2013  . VIN I (vulvar intraepithelial neoplasia I) 2017   this is from a perineal/vulvar biopsy.  . Vitamin B12 deficiency   . Wears partial dentures    lower    Past Surgical History:  Procedure Laterality Date  . ANTERIOR LAT LUMBAR FUSION  10/08/2008   L3-4  . BACK SURGERY    . BACK SURGERY  10-03-14   --Dr. Trey Sailors at [facility]   . COLONOSCOPY W/ BIOPSIES  11/2009   polyps  . COLONOSCOPY W/ BIOPSIES  11/2012   polyp, recheck in 3 years  . CYSTO  07/05/2006  . CYSTOCELE REPAIR  07/05/2006  . ESOPHAGOGASTRODUODENOSCOPY ENDOSCOPY  05/25/05   erythema and edema of gastric lumen  . FOOT SURGERY Right   . GANGLION CYST EXCISION Right    wrist  . INCONTINENCE SURGERY  07/05/2006   cystourethropexy  . ORIF FOREARM FRACTURE Right 09/2012  . SHOULDER ARTHROSCOPY WITH ROTATOR CUFF REPAIR AND SUBACROMIAL DECOMPRESSION Right 08/02/2013   Procedure: SHOULDER ARTHROSCOPY WITH ROTATOR CUFF REPAIR AND SUBACROMIAL DECOMPRESSION, EXTENSIVE DEBRIDEMENT (INCLUDING LABRUM), PARTIAL ACROMIPLASY WITH CORACOACROMIAL RELEASE, DISTAL CLAVICULECTOMY;  Surgeon: Loreta Ave, MD;  Location: [facility];  Service: Orthopedics;  Laterality: Right;  . STRABISMUS SURGERY Right 01/26/2008   2 other surgeries previously  . STRABISMUS SURGERY Right 06/03/2017   Procedure: REPAIR STRABISMUS RIGHT EYE;  Surgeon: Verne Carrow, MD;  Location: [facility];  Service: Ophthalmology;  Laterality: Right;  . Thumb surgery  2/13 & ?2010   Bilateral - states a joint was replaced  . TOTAL VAGINAL HYSTERECTOMY  1986 - or age 80   secondary AUB  . varicose veins Bilateral     Current Outpatient Prescriptions  Medication Sig Dispense Refill  . baclofen (LIORESAL) 10 MG tablet Take 1 tablet (10 mg total) by mouth 2 (two) times daily as needed for muscle spasms. 60 each 2  . benazepril (LOTENSIN) 20 MG tablet Take 20 mg by mouth daily.    . Calcium Carb-Cholecalciferol (CALCIUM 1000 + D PO) Take 1 tablet by mouth daily.    Marland Kitchen levothyroxine (SYNTHROID, LEVOTHROID) 100 MCG tablet     . Multiple Vitamin (MULTIVITAMIN) tablet Take 1 tablet by mouth daily.    . Omega-3 Fatty Acids (FISH OIL) 1000 MG CAPS Take 1 capsule by mouth daily.    . predniSONE (DELTASONE) 10 MG tablet Take 10 mg by mouth.    . vitamin B-12 (CYANOCOBALAMIN) 1000  MCG tablet Take 1,000 mcg by mouth every other day.    . Vitamin D, Ergocalciferol, (DRISDOL) 50000 units CAPS capsule Take 50,000 Units by mouth every 7 (seven) days.    . zaleplon (SONATA) 10 MG capsule     . naproxen (NAPROSYN) 500 MG tablet Take 500 mg by mouth.     No current facility-administered medications for this visit.     Family History  Problem Relation Age of Onset  . Aortic aneurysm Father   . Hypertension Father   . Coronary artery disease Mother 44  . Hypertension Mother   . Hyperlipidemia Brother        brain tumor unknown if malignant    ROS:  Pertinent items are noted in HPI.  Otherwise, a comprehensive ROS was negative.  Exam:   BP 120/62 (BP Location: Right Arm, Patient Position: Sitting, Cuff Size: Normal)   Pulse 64   Resp 16   Ht  (1.727 m)   Wt 194 lb (88 kg)   LMP 09/13/1984 (Approximate)   BMI 29.50 kg/m     General appearance: alert, cooperative and appears stated age Head: Normocephalic, without obvious abnormality, atraumatic Neck: no adenopathy, supple, symmetrical, trachea midline and thyroid normal to inspection and palpation Lungs: clear to auscultation bilaterally Breasts: normal appearance, no masses or tenderness, No nipple retraction or dimpling, No nipple discharge or bleeding, No axillary or supraclavicular adenopathy Heart: regular rate and rhythm Abdomen: soft, non-tender; no masses, no organomegaly Extremities: extremities normal, atraumatic, no cyanosis or edema Skin: Skin color, texture, turgor normal. No rashes or lesions Lymph nodes: Cervical, supraclavicular, and axillary nodes normal. No abnormal inguinal nodes palpated Neurologic: Grossly normal  Pelvic: External genitalia:   Raised 3 mm area of the perineum.              Urethra:  normal appearing urethra with no masses, tenderness or lesions              Bartholins and Skenes: normal                 Vagina: normal appearing vagina with normal color and discharge,   Mild circular 1 cm erythema of the vaginal apex.  No texture change.              Cervix: absent.               Pap taken: Yes.   Bimanual Exam:  Uterus:   Absent.               Adnexa: no mass, fullness, tenderness              Rectal exam: Yes.  .  Confirms.  Anus:  normal sphincter tone, no lesions  Chaperone was present for exam.  Assessment:   Well woman visit with normal exam. Status post TVH. Left ovarian cyst.  Right ovarian follicle. Vulvar lesion. Vaginal lesion?   Hx VIN I.  Steroid use.   Plan: Mammogram screening discussed. Recommended self breast awareness. Pap and HR HPV as above. Guidelines for Calcium, Vitamin D, regular exercise program including cardiovascular and weight bearing exercise. Return for pelvic US.  Return for vulvar biopsy.  Labs with endocrinology and rheumatology. BMD with rheumatology.  She will have an appointment in December. Follow up annually and prn.     After visit summary provided.

## 2017-06-27 NOTE — Patient Instructions (Signed)

## 2017-06-29 LAB — CYTOLOGY - PAP: Diagnosis: NEGATIVE

## 2017-07-06 ENCOUNTER — Ambulatory Visit: Payer: Medicare Other | Admitting: Obstetrics and Gynecology

## 2017-07-06 NOTE — Progress Notes (Deleted)
Subjective:     Patient ID: [name], female   DOB: 04-Jul-1946, 71 y.o.   MRN: [id number]  HPI  Patient here today for vulvar biopsy and possible vaginal biopsy. History of VIN I.   Review of Systems  LMP: Hysterectomy Contraception:Hysterectomy     Objective:   Physical Exam     Assessment:     ***    Plan:     ***

## 2017-07-07 ENCOUNTER — Encounter: Payer: Self-pay | Admitting: Obstetrics and Gynecology

## 2017-07-07 ENCOUNTER — Ambulatory Visit (INDEPENDENT_AMBULATORY_CARE_PROVIDER_SITE_OTHER): Payer: Medicare Other

## 2017-07-07 ENCOUNTER — Ambulatory Visit (INDEPENDENT_AMBULATORY_CARE_PROVIDER_SITE_OTHER): Payer: Medicare Other | Admitting: Obstetrics and Gynecology

## 2017-07-07 VITALS — BP 128/70 | HR 60 | Ht 68.0 in | Wt 194.0 lb

## 2017-07-07 DIAGNOSIS — N83202 Unspecified ovarian cyst, left side: Secondary | ICD-10-CM

## 2017-07-07 NOTE — Patient Instructions (Signed)
Bilateral Salpingo-Oophorectomy °Bilateral salpingo-oophorectomy is the surgical removal of both fallopian tubes and both ovaries. The ovaries are reproductive organs that produce eggs in women. The fallopian tubes allow eggs to move from the ovaries to the uterus. °You may need this procedure if you: °· Have had your uterus removed. This procedure is usually done after the uterus is removed. °· Have cancer of the fallopian tubes or ovaries. °· Have a high risk of cancer of the fallopian tubes or ovaries. ° °There are three different techniques that can be used for this procedure: °· Open. One large incision will be made in your abdomen. °· Laparoscopic. A thin, lighted tube with a small camera on the end (laparoscope) will be used to help perform the procedure. The laparoscope will allow your surgeon to make several small incisions in the abdomen instead of one large incision. °· Robot-assisted. A computer will be used to control surgical instruments that are attached to robotic arms. A laparoscope may also be used with this technique. ° °As a result of this procedure, you will become sterile (unable to become pregnant), and you will go into menopause (no longer able to have menstrual periods). You may develop symptoms of menopause such as hot flashes, night sweats, and mood changes. Your sex drive may also be affected. °Tell a health care provider about: °· Any allergies you have. °· All medicines you are taking, including vitamins, herbs, eye drops, creams, and over-the-counter medicines. °· Any problems you or family members have had with anesthetic medicines. °· Any blood disorders you have. °· Any surgeries you have had. °· Any medical conditions you have. °· Whether you are pregnant or may be pregnant. °What are the risks? °Generally, this is a safe procedure. However, problems may occur, including: °· Infection. °· Bleeding. °· Allergic reactions to medicines. °· Damage to other structures or  organs. °· Blood clots in the legs or lungs. ° °What happens before the procedure? °Staying hydrated °Follow instructions from your health care provider about hydration, which may include: °· Up to 2 hours before the procedure - you may continue to drink clear liquids, such as water, clear fruit juice, black coffee, and plain tea. ° °Eating and drinking restrictions °Follow instructions from your health care provider about eating and drinking, which may include: °· 8 hours before the procedure - stop eating heavy meals or foods such as meat, fried foods, or fatty foods. °· 6 hours before the procedure - stop eating light meals or foods, such as toast or cereal. °· 6 hours before the procedure - stop drinking milk or drinks that contain milk. °· 2 hours before the procedure - stop drinking clear liquids. ° °Medicines °· Ask your health care provider about: °? Changing or stopping your regular medicines. This is especially important if you are taking diabetes medicines or blood thinners. °? Taking medicines such as aspirin and ibuprofen. These medicines can thin your blood. Do not take these medicines before your procedure if your health care provider instructs you not to. °· You may be given antibiotic medicine to help prevent infection. °General instructions °· Do not smoke for at least 2 weeks before your procedure or as told by your health care provider. °· You may have an exam or testing. °· You may have a blood or urine sample taken. °· Ask your health care provider how your surgical site will be marked or identified. °· Plan to have someone take you home from the hospital. °· If you   will be going home right after the procedure, plan to have someone with you for 24 hours. °What happens during the procedure? °· To reduce your risk of infection: °? Your health care team will wash or sanitize their hands. °? Your skin will be washed with soap. °? Hair may be removed from the surgical area. °· An IV tube will be  inserted into one of your veins. °· You will be given one or more of the following: °? A medicine to help you relax (sedative). °? A medicine to make you fall asleep (general anesthetic). °· A thin tube (catheter) will be inserted through your urethra and into your bladder. The catheter drains urine during your procedure. °· Depending on the type of surgery you are having, your surgeon will do one of the following: °? Make one incision in your abdomen (open surgery). °? Make two small incisions in your abdomen (laparoscopic surgery). The laparoscope will be passed through one incision, and surgical instruments will be passed through the other. °? Make several small incisions in your abdomen (robot-assisted surgery). A laparoscope and other surgical instruments may be passed through the incisions. °· Your fallopian tubes and ovaries will be cut away from the uterus and removed. °· Your blood vessels will be clamped and tied to prevent too much bleeding. °· The incision(s) in your abdomen will be closed with stitches (sutures) or staples. °· A bandage (dressing) may be placed over your incision(s). °The procedure may vary among health care providers and hospitals. °What happens after the procedure? °· Your blood pressure, heart rate, breathing rate, and blood oxygen level will be monitored until the medicines you were given have worn off. °· You may continue to receive fluids and medicines through an IV tube. °· You may continue to have a catheter draining your urine. °· You may have to wear compression stockings. These stockings help to prevent blood clots and reduce swelling in your legs. °· You will be given pain medicine as needed. °· Do not drive for 24 hours if you received a sedative. °Summary °· Bilateral salpingo-oophorectomy is a procedure to remove both fallopian tubes and both ovaries. °· There are three different techniques that can be used for this procedure, including open, laparoscopic, and robotic.  Talk with your health care provider about how your procedure will be done. °· As a result of this procedure, you will become sterile and you will go into menopause. °· Plan to have someone take you home from the hospital. °This information is not intended to replace advice given to you by your health care provider. Make sure you discuss any questions you have with your health care provider. °Document Released: 08/30/2005 Document Revised: 10/04/2016 Document Reviewed: 10/04/2016 °Elsevier Interactive Patient Education © 2018 [employer] °Diagnostic Laparoscopy °A diagnostic laparoscopy is a procedure to diagnose diseases in the abdomen. During the procedure, a thin, lighted, pencil-sized instrument called a laparoscope is inserted into the abdomen through an incision. The laparoscope allows your health care provider to look at the organs inside your body. °Tell a health care provider about: °· Any allergies you have. °· All medicines you are taking, including vitamins, herbs, eye drops, creams, and over-the-counter medicines. °· Any problems you or family members have had with anesthetic medicines. °· Any blood disorders you have. °· Any surgeries you have had. °· Any medical conditions you have. °What are the risks? °Generally, this is a safe procedure. However, problems can occur, which may include: °· Infection. °·   Bleeding. °· Damage to other organs. °· Allergic reaction to the anesthetics used during the procedure. ° °What happens before the procedure? °· Do not eat or drink anything after midnight on the night before the procedure or as directed by your health care provider. °· Ask your health care provider about: °? Changing or stopping your regular medicines. °? Taking medicines such as aspirin and ibuprofen. These medicines can thin your blood. Do not take these medicines before your procedure if your health care provider instructs you not to. °· Plan to have someone take you home after the  procedure. °What happens during the procedure? °· You may be given a medicine to help you relax (sedative). °· You will be given a medicine to make you sleep (general anesthetic). °· Your abdomen will be inflated with a gas. This will make your organs easier to see. °· Small incisions will be made in your abdomen. °· A laparoscope and other small instruments will be inserted into the abdomen through the incisions. °· A tissue sample may be removed from an organ in the abdomen for examination. °· The instruments will be removed from the abdomen. °· The gas will be released. °· The incisions will be closed with stitches (sutures). °What happens after the procedure? °Your blood pressure, heart rate, breathing rate, and blood oxygen level will be monitored often until the medicines you were given have worn off. °This information is not intended to replace advice given to you by your health care provider. Make sure you discuss any questions you have with your health care provider. °Document Released: 12/06/2000 Document Revised: 01/08/2016 Document Reviewed: 04/12/2014 °Elsevier Interactive Patient Education © 2018 Else[employer] Inc. ° °

## 2017-07-07 NOTE — Progress Notes (Signed)
Patient ID: [name], female   DOB: [birth year], 71 y.o.   MRN: [id number] GYNECOLOGY  VISIT   HPI: 71 y.o.   Married  Caucasian  female   [phone] with Patient's last menstrual period was 09/13/1984 (approximate).   here for pelvic ultrasound for follow up on ovarian cysts.   Followed since 2015 for ovarian cysts originally seen on MRI done for evaluation of back.  Has a simple left ovarian cyst being followed yearly.  In December 2015, the left ovarian cyst measured 21 mm.  Right ovary has a follicular type cyst.  CA125 was 8 on 06/02/15.  Can feel something on her left lower abdomen if she is standing a lot.  No pain medication use.   Needs to return for vulvar biopsy.   GYNECOLOGIC HISTORY: Patient's last menstrual period was 09/13/1984 (approximate). Contraception: Postmenopausal Menopausal hormone therapy:  none Last mammogram: 12-20-16 Density B/Neg/BiRads1:TBC Last pap smear: 06-27-17 Neg                              06-03-16 ASCUS:Neg HR HPV OB History    Gravida Para Term Preterm AB Living   3 3 3  0 0 3   SAB TAB Ectopic Multiple Live Births   0 0 0 0 3         Patient Active Problem List   Diagnosis Date Noted  . History of repair of right rotator cuff 03/03/2017  . Primary osteoarthritis of both hands/ has had bilateral [facility] surgery  03/03/2017  . History of hypertension 03/03/2017  . History of hyperlipidemia 03/03/2017  . DDD (degenerative disc disease), lumbar/ history of lumbar surgery  03/03/2017  . Osteopenia of multiple sites 03/03/2017  . Chronic left shoulder pain 01/20/2017  . Primary osteoarthritis of both knees 01/20/2017  . Trapezius muscle spasm 01/20/2017  . Insomnia 01/20/2017  . Fatigue 01/20/2017  . Fibromyalgia 01/20/2017  . High risk medications (not anticoagulants) long-term use 01/20/2017  . PMR (polymyalgia rheumatica) (HCC) 01/20/2017  . Left ovarian cyst 06/02/2015  . Degenerative joint disease, shoulder, right 08/02/2013  .  Precordial pain 02/11/2012  . HTN (hypertension) 02/11/2012  . Hyperlipidemia 02/11/2012    Past Medical History:  Diagnosis Date  . Bilateral ovarian cysts 09/12/14   bilateral simple ovarian cysts  . 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 Fibromyalgia   . HTN (hypertension)    under control with meds., has been on med. x 20 yr.  . Hyperlipidemia   . Hypothyroid   . IBS (irritable bowel syndrome)   . Impingement syndrome of right shoulder 07/2013  . PONV (postoperative nausea and vomiting)   . Rupture of right rotator cuff 07/2013  . VIN I (vulvar intraepithelial neoplasia I) 2017   this is from a perineal/vulvar biopsy.  . Vitamin B12 deficiency   . Wears partial dentures    lower    Past Surgical History:  Procedure Laterality Date  . ANTERIOR LAT LUMBAR FUSION  10/08/2008   L3-4  . BACK SURGERY    . BACK SURGERY  10-03-14   --Dr. Trey Sailors at [facility]  . COLONOSCOPY W/ BIOPSIES  11/2009   polyps  . COLONOSCOPY W/ BIOPSIES  11/2012   polyp, recheck in 3 years  . CYSTO  07/05/2006  . CYSTOCELE REPAIR  07/05/2006  . ESOPHAGOGASTRODUODENOSCOPY ENDOSCOPY  05/25/05   erythema and edema of gastric lumen  . FOOT SURGERY Right   .  GANGLION CYST EXCISION Right    wrist  . INCONTINENCE SURGERY  07/05/2006   cystourethropexy  . ORIF FOREARM FRACTURE Right 09/2012  . SHOULDER ARTHROSCOPY WITH ROTATOR CUFF REPAIR AND SUBACROMIAL DECOMPRESSION Right 08/02/2013   Procedure: SHOULDER ARTHROSCOPY WITH ROTATOR CUFF REPAIR AND SUBACROMIAL DECOMPRESSION, EXTENSIVE DEBRIDEMENT (INCLUDING LABRUM), PARTIAL ACROMIPLASY WITH CORACOACROMIAL RELEASE, DISTAL CLAVICULECTOMY;  Surgeon: Loreta Aveaniel F Murphy, MD;  Location: [facility];  Service: Orthopedics;  Laterality: Right;  . STRABISMUS SURGERY Right 01/26/2008   2 other surgeries previously  . STRABISMUS SURGERY Right 06/03/2017   Procedure: REPAIR STRABISMUS RIGHT EYE;  Surgeon: Verne CarrowYoung, William, MD;   Location: [facility];  Service: Ophthalmology;  Laterality: Right;  . Thumb surgery  2/13 & ?2010   Bilateral - states a joint was replaced  . TOTAL VAGINAL HYSTERECTOMY  1986 - or age 71   secondary AUB  . TUBAL LIGATION     laparosopic BTL age 71  . varicose veins Bilateral     Current Outpatient Prescriptions  Medication Sig Dispense Refill  . baclofen (LIORESAL) 10 MG tablet Take 1 tablet (10 mg total) by mouth 2 (two) times daily as needed for muscle spasms. 60 each 2  . benazepril (LOTENSIN) 20 MG tablet Take 20 mg by mouth daily.    . Calcium Carb-Cholecalciferol (CALCIUM 1000 + D PO) Take 1 tablet by mouth daily.    Marland Kitchen. levothyroxine (SYNTHROID, LEVOTHROID) 100 MCG tablet     . Multiple Vitamin (MULTIVITAMIN) tablet Take 1 tablet by mouth daily.    . naproxen (NAPROSYN) 500 MG tablet Take 500 mg by mouth.    . Omega-3 Fatty Acids (FISH OIL) 1000 MG CAPS Take 1 capsule by mouth daily.    . predniSONE (DELTASONE) 10 MG tablet Take 10 mg by mouth.    . vitamin B-12 (CYANOCOBALAMIN) 1000 MCG tablet Take 1,000 mcg by mouth every other day.    . Vitamin D, Ergocalciferol, (DRISDOL) 50000 units CAPS capsule Take 50,000 Units by mouth every 7 (seven) days.    . zaleplon (SONATA) 10 MG capsule      No current facility-administered medications for this visit.      ALLERGIES: Penicillins and Sulfa antibiotics  Family History  Problem Relation Age of Onset  . Aortic aneurysm Father   . Hypertension Father   . Coronary artery disease Mother [id number]  . Hypertension Mother   . Hyperlipidemia Brother        brain tumor unknown if malignant    Social History   Social History  . Marital status: Married    Spouse name: N/A  . Number of children: 3  . Years of education: N/A   Occupational History  .  Unemployed   Social History Main Topics  . Smoking status: Never Smoker  . Smokeless tobacco: Never Used  . Alcohol use No  . Drug use: No  . Sexual activity: Not  Currently    Partners: Male    Birth control/ protection: Surgical     Comment: Hyst   Other Topics Concern  . Not on file   Social History Narrative   Lives at home with husband and granddaughter.      ROS:  Pertinent items are noted in HPI.  PHYSICAL EXAMINATION:    BP 128/70 (BP Location: Right Arm, Patient Position: Sitting, Cuff Size: Normal)   Pulse 60   Ht 5\' 8"  (1.727 m)   Wt 194 lb (88 kg)   LMP 09/13/1984 (Approximate)  BMI 29.50 kg/m     General appearance: alert, cooperative and appears stated age  Pelvic ultrasound: Uterus absent.  Left ovary with simple cyst 31 x 24 x 28 mm with echoes within, thin walled, no abnormal blood flow.  Right ovary with follicle 11 mm.  No free fluid.   ASSESSMENT  Left ovarian simple cyst.  Enlarging.  Perineal lesion.   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   Will check CA125.  We had a preliminary discussion of laparoscopic bilateral salpingo-oophorectomy.  She will return for vulvar biopsy.   An After Visit Summary was printed and given to the patient.  _15_____ minutes face to face time of which over 50% was spent in counseling.

## 2017-07-08 LAB — CA 125: Cancer Antigen (CA) 125: 8.8 U/mL (ref 0.0–38.1)

## 2017-07-12 ENCOUNTER — Telehealth: Payer: Self-pay

## 2017-07-12 ENCOUNTER — Encounter: Payer: Self-pay | Admitting: Obstetrics and Gynecology

## 2017-07-12 DIAGNOSIS — N83202 Unspecified ovarian cyst, left side: Secondary | ICD-10-CM

## 2017-07-12 NOTE — Telephone Encounter (Signed)
-----   Message from Patton SallesBrook E Amundson C Silva, MD sent at 07/11/2017  5:48 PM EDT ----- Please report normal CA125 level.  I have reviewed the patient's ultrasounds and CA125 levels.  I am recommending a follow up ultrasound in 3 months due to some of the subtle changes noted with this last ultrasound.  Please schedule this appointment and send through to precert.

## 2017-07-12 NOTE — Telephone Encounter (Signed)
Spoke with patient. Results given as seen below. Patient verbalizes understanding. Declines to schedule 3 month follow up at this time. Would like to schedule when she is in office on 07/28/2017 for vulvar biopsy. Order for PUS placed.  Routing to provider for final review. Patient agreeable to disposition. Will close encounter.

## 2017-07-14 ENCOUNTER — Telehealth: Payer: Self-pay | Admitting: Obstetrics and Gynecology

## 2017-07-14 NOTE — Telephone Encounter (Signed)
Opened in error

## 2017-07-28 ENCOUNTER — Ambulatory Visit (INDEPENDENT_AMBULATORY_CARE_PROVIDER_SITE_OTHER): Payer: Medicare Other | Admitting: Obstetrics and Gynecology

## 2017-07-28 DIAGNOSIS — N9089 Other specified noninflammatory disorders of vulva and perineum: Secondary | ICD-10-CM

## 2017-07-28 NOTE — Progress Notes (Signed)
Subjective:     Patient ID: [name], female   DOB: 1946-06-03, 71 y.o.   MRN: [id number]  HPI  Patient here today for vulvar/vaginal biopsy. She had a mild circular 1 cm erythema of the vaginal apex.  Pap was normal on 06/27/17.  Had 2 episodes of urinary incontinence and wondering if it is related to her ovarian cyst of 3 cm.   Review of Systems  LMP:1986 Contraception:Postmenopausal     Objective:   Physical Exam  Colposcopy with vulvar biopsy.  Consent for procedure.  3% acetic acid used.  White and green light filter used.  No vaginal lesions noted.  3% acetic acid to soaked gauze on the vulva. 3 mm raised condylomatous like lesion and split in the tissue of the right perineal body. Local 1% lidocaine - lot [id number], exp 11/21.  4 mm punch biopsy done. 2 interrupted sutures of 3/0 vicryl.  Minimal EBL. No complications.     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 Urinary incontinence episodes.     Plan:     Follow up biopsy.  Post biopsy instructions/precautions given.  Incontinence not likely due to presence of small ovarian cyst.   After visit summary to patient.

## 2017-07-28 NOTE — Patient Instructions (Signed)

## 2017-08-01 ENCOUNTER — Encounter: Payer: Self-pay | Admitting: Obstetrics and Gynecology

## 2017-08-29 ENCOUNTER — Other Ambulatory Visit: Payer: Self-pay | Admitting: Endocrinology

## 2017-08-29 DIAGNOSIS — M8580 Other specified disorders of bone density and structure, unspecified site: Secondary | ICD-10-CM

## 2017-08-30 NOTE — Progress Notes (Signed)
Office Visit Note  Patient: [name]             Date of Birth: August 21, 1946           MRN: [id number]             PCP: Kaleen Mask, MD Referring: Kaleen Mask, * Visit Date: 09/12/2017 Occupation: @GUAROCC @    Subjective:  Muscle pain   History of Present Illness: [name] is a 71 y.o. female with history of polymyalgia, osteoarthritis and fibromyalgia syndrome. According to her about 2 months ago she started having symptoms of increased muscle pain and weakness. She felt the symptoms were similar to her usual episode of polymyalgia rheumatica. She was seen by Dr. Channing Mutters  and was started on prednisone 10 mg a day after obtaining a sedimentation rate. According to patient the sedimentation rate was 42. She started feeling better after about a week. Repeat sedimentation rate was 40. She still continues to have some discomfort with doing better. She continues to have some upper and lower back pain.  Activities of Daily Living:  Patient reports morning stiffness for 30 minutes.   Patient Denies nocturnal pain.  Difficulty dressing/grooming: Denies Difficulty climbing stairs: Reports Difficulty getting out of chair: Reports Difficulty using hands for taps, buttons, cutlery, and/or writing: Denies   Review of Systems  Constitutional: Positive for weakness. Negative for fatigue, night sweats, weight gain and weight loss.  HENT: Negative for mouth sores, trouble swallowing, trouble swallowing, mouth dryness and nose dryness.   Eyes: Positive for dryness. Negative for pain, redness and visual disturbance.  Respiratory: Negative for cough, shortness of breath and difficulty breathing.   Cardiovascular: Negative for chest pain, palpitations, hypertension, irregular heartbeat and swelling in legs/feet.  Gastrointestinal: Negative for blood in stool, constipation and diarrhea.  Endocrine: Negative for increased urination.  Genitourinary: Negative for vaginal  dryness.  Musculoskeletal: Positive for myalgias and myalgias. Negative for arthralgias, joint pain, joint swelling, muscle weakness, morning stiffness and muscle tenderness.  Skin: Negative for color change, rash, hair loss, skin tightness, ulcers and sensitivity to sunlight.  Allergic/Immunologic: Negative for susceptible to infections.  Neurological: Negative for dizziness, memory loss and night sweats.  Hematological: Negative for swollen glands.  Psychiatric/Behavioral: Positive for sleep disturbance. Negative for depressed mood. The patient is not nervous/anxious.     PMFS History:  Patient Active Problem List   Diagnosis Date Noted  . History of repair of right rotator cuff 03/03/2017  . Primary osteoarthritis of both hands/ has had bilateral CMC surgery  03/03/2017  . History of hypertension 03/03/2017  . History of hyperlipidemia 03/03/2017  . DDD (degenerative disc disease), lumbar/ history of lumbar surgery  03/03/2017  . Osteopenia of multiple sites 03/03/2017  . Chronic left shoulder pain 01/20/2017  . Primary osteoarthritis of both knees 01/20/2017  . Trapezius muscle spasm 01/20/2017  . Insomnia 01/20/2017  . Fatigue 01/20/2017  . Fibromyalgia 01/20/2017  . High risk medications (not anticoagulants) long-term use 01/20/2017  . PMR (polymyalgia rheumatica) (HCC) 01/20/2017  . Left ovarian cyst 06/02/2015  . Degenerative joint disease, shoulder, right 08/02/2013  . Precordial pain 02/11/2012  . HTN (hypertension) 02/11/2012  . Hyperlipidemia 02/11/2012    Past Medical History:  Diagnosis Date  . Bilateral ovarian cysts 09/12/2014   bilateral simple ovarian cysts.  [place] due in January 2018.  . 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 Fibromyalgia   . HTN (hypertension)    under control with  meds., has been on med. x 20 yr.  . Hyperlipidemia   . Hypothyroid   . IBS (irritable bowel syndrome)   . Impingement syndrome of right shoulder 07/2013  .  PONV (postoperative nausea and vomiting)   . Rupture of right rotator cuff 07/2013  . VIN I (vulvar intraepithelial neoplasia I) 2017, 2018   this is from a perineal/vulvar biopsy.  . Vitamin B12 deficiency   . Wears partial dentures    lower    Family History  Problem Relation Age of Onset  . Aortic aneurysm Father   . Hypertension Father   . Coronary artery disease Mother [id number]  . Hypertension Mother   . Hyperlipidemia Brother        brain tumor unknown if malignant   Past Surgical History:  Procedure Laterality Date  . ANTERIOR LAT LUMBAR FUSION  10/08/2008   L3-4  . BACK SURGERY    . BACK SURGERY  10-03-14   --[facility]  . COLONOSCOPY W/ BIOPSIES  11/2009   polyps  . COLONOSCOPY W/ BIOPSIES  11/2012   polyp, recheck in 3 years  . CYSTO  07/05/2006  . CYSTOCELE REPAIR  07/05/2006  . ESOPHAGOGASTRODUODENOSCOPY ENDOSCOPY  05/25/05   erythema and edema of gastric lumen  . FOOT SURGERY Right   . GANGLION CYST EXCISION Right    wrist  . INCONTINENCE SURGERY  07/05/2006   cystourethropexy  . ORIF FOREARM FRACTURE Right 09/2012  . SHOULDER ARTHROSCOPY WITH ROTATOR CUFF REPAIR AND SUBACROMIAL DECOMPRESSION Right 08/02/2013   Procedure: SHOULDER ARTHROSCOPY WITH ROTATOR CUFF REPAIR AND SUBACROMIAL DECOMPRESSION, EXTENSIVE DEBRIDEMENT (INCLUDING LABRUM), PARTIAL ACROMIPLASY WITH CORACOACROMIAL RELEASE, DISTAL CLAVICULECTOMY;  Surgeon: Loreta Aveaniel F Murphy, MD;  Location: [facility];  Service: Orthopedics;  Laterality: Right;  . STRABISMUS SURGERY Right 01/26/2008   2 other surgeries previously  . STRABISMUS SURGERY Right 06/03/2017   Procedure: REPAIR STRABISMUS RIGHT EYE;  Surgeon: Verne CarrowYoung, William, MD;  Location: [facility];  Service: Ophthalmology;  Laterality: Right;  . Thumb surgery  2/13 & ?2010   Bilateral - states a joint was replaced  . TOTAL VAGINAL HYSTERECTOMY  1986 - or age 71   secondary AUB  . TUBAL LIGATION     laparosopic  BTL age 89  . varicose veins Bilateral    Social History   Social History Narrative   Lives at home with husband and granddaughter.       Objective: Vital Signs: BP 117/73 (BP Location: Left Arm, Patient Position: Sitting, Cuff Size: Normal)   Pulse 81   Resp 15   Ht 5\' 8"  (1.727 m)   Wt 200 lb (90.7 kg)   LMP 09/13/1984 (Approximate)   BMI 30.41 kg/m    Physical Exam  Constitutional: She is oriented to person, place, and time. She appears well-developed and well-nourished.  HENT:  Head: Normocephalic and atraumatic.  Eyes: Conjunctivae and EOM are normal.  Neck: Normal range of motion.  Cardiovascular: Normal rate, regular rhythm, normal heart sounds and intact distal pulses.  Pulmonary/Chest: Effort normal and breath sounds normal.  Abdominal: Soft. Bowel sounds are normal.  Lymphadenopathy:    She has no cervical adenopathy.  Neurological: She is alert and oriented to person, place, and time.  Skin: Skin is warm and dry. Capillary refill takes less than 2 seconds.  Psychiatric: She has a normal mood and affect. Her behavior is normal.  Nursing note and vitals reviewed.    Musculoskeletal Exam: C-spine and  thoracic lumbar spine limited range of motion. Shoulder joints abduction up to 110 with some discomfort. Elbow joints wrist joints MCPs PIPs DIPs with good range of motion. She is some osteoarthritic changes in her hands and feet. Hip joints and knee joints are good range of motion. She has no muscle weakness in her lower extremities.  CDAI Exam: No CDAI exam completed.    Investigation: No additional findings. CBC Latest Ref Rng & Units 03/04/2017 08/02/2013 10/02/2008  WBC 3.8 - 10.8 K/uL 8.3 - 9.0  Hemoglobin 11.7 - 15.5 g/dL 16.112.3 09.613.5 04.513.6  Hematocrit 35.0 - 45.0 % 39.5 - 41.6  Platelets 140 - 400 K/uL 326 - 351   CMP Latest Ref Rng & Units 03/04/2017 08/01/2013 10/02/2008  Glucose 65 - 99 mg/dL 90 409(W121(H) 119(J111(H)  BUN 7 - 25 mg/dL 17 17 19   Creatinine 0.50 -  0.99 mg/dL 4.780.70 2.950.66 6.210.73  Sodium 135 - 146 mmol/L 142 142 139  Potassium 3.5 - 5.3 mmol/L 4.1 4.1 4.7  Chloride 98 - 110 mmol/L 108 104 102  CO2 20 - 31 mmol/L 24 25 26   Calcium 8.6 - 10.4 mg/dL 9.3 9.6 30.810.0  Total Protein 6.1 - 8.1 g/dL 7.0 - 8.3  Total Bilirubin 0.2 - 1.2 mg/dL 0.4 - 0.4  Alkaline Phos 33 - 130 U/L 92 - 143(H)  AST 10 - 35 U/L 20 - 32  ALT 6 - 29 U/L 18 - 34    Imaging: No results found.  Speciality Comments: No specialty comments available.    Procedures:  No procedures performed Allergies: Penicillins and Sulfa antibiotics   Assessment / Plan:     Visit Diagnoses: PMR (polymyalgia rheumatica) (HCC): Patient was treated for PMR in the past and was in remission until 2 years ago when she developed increased pain and discomfort. She states she was having difficulty even getting out of the car. She was seen by Dr. Channing Muttersoy and had a sedimentation rate which was 42 per patient. She was placed on prednisone 10 mg a day. She states she noticed improvement in her symptoms. She still continues to have some stiffness in her shoulders. Her request I will give her prednisone 15 mg by mouth daily for 2 weeks and then decrease it to 10 mg by mouth daily. I will check following labs today.  On prednisone therapy - 10 mg po qd  Fibromyalgia: She continues to have some generalized pain and discomfort.  Other insomnia: Good sleep hygiene was discussed.  Other fatigue - Plan: CBC with Differential/Platelet, COMPLETE METABOLIC PANEL WITH GFR, Sedimentation rate, CK, TSH  Trapezius muscle spasm: She continues to have some pain and trapezius area.  Primary osteoarthritis of both hands/ has had bilateral CMC surgery: Joint protection and muscle strengthening discussed.   Primary osteoarthritis of right shoulder: History of right rotator cuff tear repair.  Primary osteoarthritis of both knees: Doing better.  DDD (degenerative disc disease), lumbar/ history of lumbar surgery :  Chronic pain. She's been followed by Dr. Channing Muttersoy.  Osteopenia of multiple sites - bone density through PCP office pt states 2016 . She will have repeat DEXA by her PCP.  Sarcoidosis: Stable per patient.  History of hyperlipidemia  History of hypertension  Chronic pain of both shoulders - Plan: Rheumatoid factor, Cyclic citrul peptide antibody, IgG, 14-3-3 eta Protein, Serum protein electrophoresis with reflex    Orders: Orders Placed This Encounter  Procedures  . CBC with Differential/Platelet  . COMPLETE METABOLIC PANEL WITH GFR  . Sedimentation  rate  . CK  . TSH  . Rheumatoid factor  . Cyclic citrul peptide antibody, IgG  . 14-3-3 eta Protein  . Serum protein electrophoresis with reflex   Meds ordered this encounter  Medications  . predniSONE (DELTASONE) 5 MG tablet    Sig: 3 tabs po q AM x 2 weeks, then stay on 2 tabs po q AM    Dispense:  180 tablet    Refill:  0    Face-to-face time spent with patient was 30 minutes. Greater than 50% of time was spent in counseling and coordination of care.  Follow-Up Instructions: Return in about 2 months (around 11/10/2017) for PMR OA FMS DDD.   Pollyann Savoy, MD  Note - This record has been created using Animal nutritionist.  Chart creation errors have been sought, but may not always  have been located. Such creation errors do not reflect on  the standard of medical care.

## 2017-09-07 ENCOUNTER — Other Ambulatory Visit: Payer: Self-pay | Admitting: Nurse Practitioner

## 2017-09-07 DIAGNOSIS — R51 Headache: Principal | ICD-10-CM

## 2017-09-07 DIAGNOSIS — R519 Headache, unspecified: Secondary | ICD-10-CM

## 2017-09-12 ENCOUNTER — Encounter: Payer: Self-pay | Admitting: Rheumatology

## 2017-09-12 ENCOUNTER — Ambulatory Visit: Payer: Medicare Other | Admitting: Rheumatology

## 2017-09-12 VITALS — BP 117/73 | HR 81 | Resp 15 | Ht 68.0 in | Wt 200.0 lb

## 2017-09-12 DIAGNOSIS — D869 Sarcoidosis, unspecified: Secondary | ICD-10-CM

## 2017-09-12 DIAGNOSIS — M19042 Primary osteoarthritis, left hand: Secondary | ICD-10-CM

## 2017-09-12 DIAGNOSIS — M5136 Other intervertebral disc degeneration, lumbar region: Secondary | ICD-10-CM | POA: Diagnosis not present

## 2017-09-12 DIAGNOSIS — M8589 Other specified disorders of bone density and structure, multiple sites: Secondary | ICD-10-CM | POA: Diagnosis not present

## 2017-09-12 DIAGNOSIS — M170 Bilateral primary osteoarthritis of knee: Secondary | ICD-10-CM | POA: Diagnosis not present

## 2017-09-12 DIAGNOSIS — Z8639 Personal history of other endocrine, nutritional and metabolic disease: Secondary | ICD-10-CM

## 2017-09-12 DIAGNOSIS — G8929 Other chronic pain: Secondary | ICD-10-CM

## 2017-09-12 DIAGNOSIS — G4709 Other insomnia: Secondary | ICD-10-CM | POA: Diagnosis not present

## 2017-09-12 DIAGNOSIS — M353 Polymyalgia rheumatica: Secondary | ICD-10-CM

## 2017-09-12 DIAGNOSIS — Z7952 Long term (current) use of systemic steroids: Secondary | ICD-10-CM | POA: Diagnosis not present

## 2017-09-12 DIAGNOSIS — M62838 Other muscle spasm: Secondary | ICD-10-CM

## 2017-09-12 DIAGNOSIS — M797 Fibromyalgia: Secondary | ICD-10-CM | POA: Diagnosis not present

## 2017-09-12 DIAGNOSIS — M19041 Primary osteoarthritis, right hand: Secondary | ICD-10-CM

## 2017-09-12 DIAGNOSIS — R5383 Other fatigue: Secondary | ICD-10-CM | POA: Diagnosis not present

## 2017-09-12 DIAGNOSIS — Z9889 Other specified postprocedural states: Secondary | ICD-10-CM | POA: Diagnosis not present

## 2017-09-12 DIAGNOSIS — M25512 Pain in left shoulder: Secondary | ICD-10-CM

## 2017-09-12 DIAGNOSIS — M25511 Pain in right shoulder: Secondary | ICD-10-CM

## 2017-09-12 DIAGNOSIS — M19011 Primary osteoarthritis, right shoulder: Secondary | ICD-10-CM | POA: Diagnosis not present

## 2017-09-12 DIAGNOSIS — Z8679 Personal history of other diseases of the circulatory system: Secondary | ICD-10-CM

## 2017-09-12 MED ORDER — PREDNISONE 5 MG PO TABS: ORAL_TABLET | ORAL | 0 refills | Status: DC

## 2017-09-15 ENCOUNTER — Other Ambulatory Visit: Payer: Medicare Other

## 2017-09-16 LAB — PROTEIN ELECTROPHORESIS, SERUM, WITH REFLEX
Albumin ELP: 4.2 g/dL (ref 3.8–4.8)
Alpha 1: 0.3 g/dL (ref 0.2–0.3)
Alpha 2: 0.8 g/dL (ref 0.5–0.9)
Beta 2: 0.4 g/dL (ref 0.2–0.5)
Beta Globulin: 0.6 g/dL (ref 0.4–0.6)
Gamma Globulin: 1.1 g/dL (ref 0.8–1.7)
Total Protein: 7.4 g/dL (ref 6.1–8.1)

## 2017-09-16 LAB — SEDIMENTATION RATE: Sed Rate: 22 mm/h (ref 0–30)

## 2017-09-16 LAB — COMPLETE METABOLIC PANEL WITH GFR
AG Ratio: 1.3 (calc) (ref 1.0–2.5)
ALT: 22 U/L (ref 6–29)
AST: 19 U/L (ref 10–35)
Albumin: 4.1 g/dL (ref 3.6–5.1)
Alkaline phosphatase (APISO): 91 U/L (ref 33–130)
BUN: 18 mg/dL (ref 7–25)
CO2: 25 mmol/L (ref 20–32)
Calcium: 9.5 mg/dL (ref 8.6–10.4)
Chloride: 106 mmol/L (ref 98–110)
Creat: 0.7 mg/dL (ref 0.50–0.99)
GFR, Est African American: 102 mL/min/{1.73_m2} (ref >=60)
GFR, Est Non African American: 88 mL/min/{1.73_m2} (ref >=60)
Globulin: 3.1 g/dL (calc) (ref 1.9–3.7)
Glucose, Bld: 113 mg/dL — ABNORMAL HIGH (ref 65–99)
Potassium: 4.2 mmol/L (ref 3.5–5.3)
Sodium: 142 mmol/L (ref 135–146)
Total Bilirubin: 0.3 mg/dL (ref 0.2–1.2)
Total Protein: 7.2 g/dL (ref 6.1–8.1)

## 2017-09-16 LAB — CK: Total CK: 144 U/L — ABNORMAL HIGH (ref 29–143)

## 2017-09-16 LAB — CBC WITH DIFFERENTIAL/PLATELET
Basophils Absolute: 41 cells/uL (ref 0–200)
Basophils Relative: 0.4 %
Eosinophils Absolute: 20 cells/uL (ref 15–500)
Eosinophils Relative: 0.2 %
HCT: 38.1 % (ref 35.0–45.0)
Hemoglobin: 12.2 g/dL (ref 11.7–15.5)
Lymphs Abs: 2081 cells/uL (ref 850–3900)
MCH: 26.1 pg — ABNORMAL LOW (ref 27.0–33.0)
MCHC: 32 g/dL (ref 32.0–36.0)
MCV: 81.6 fL (ref 80.0–100.0)
MPV: 10.5 fL (ref 7.5–12.5)
Monocytes Relative: 2.7 %
Neutro Abs: 7783 cells/uL (ref 1500–7800)
Neutrophils Relative %: 76.3 %
Platelets: 317 10*3/uL (ref 140–400)
RBC: 4.67 10*6/uL (ref 3.80–5.10)
RDW: 14.5 % (ref 11.0–15.0)
Total Lymphocyte: 20.4 %
WBC mixed population: 275 cells/uL (ref 200–950)
WBC: 10.2 10*3/uL (ref 3.8–10.8)

## 2017-09-16 LAB — 14-3-3 ETA PROTEIN: 14-3-3 eta Protein: 0.3 ng/mL — ABNORMAL HIGH (ref <0.2)

## 2017-09-16 LAB — CYCLIC CITRUL PEPTIDE ANTIBODY, IGG: Cyclic Citrullin Peptide Ab: <16 UNITS

## 2017-09-16 LAB — TSH: TSH: 0.81 mIU/L (ref 0.40–4.50)

## 2017-09-16 LAB — RHEUMATOID FACTOR: Rhuematoid fact SerPl-aCnc: <14 IU/mL (ref <14)

## 2017-09-19 ENCOUNTER — Ambulatory Visit
Admission: RE | Admit: 2017-09-19 | Discharge: 2017-09-19 | Disposition: A | Discharge status: Home / Self Care | Payer: Medicare Other | Source: Ambulatory Visit | Attending: Nurse Practitioner | Admitting: Nurse Practitioner

## 2017-09-19 DIAGNOSIS — R519 Headache, unspecified: Secondary | ICD-10-CM

## 2017-09-19 DIAGNOSIS — R51 Headache: Principal | ICD-10-CM

## 2017-09-20 ENCOUNTER — Other Ambulatory Visit: Payer: Self-pay | Admitting: *Deleted

## 2017-09-20 DIAGNOSIS — M2550 Pain in unspecified joint: Secondary | ICD-10-CM

## 2017-09-20 NOTE — Progress Notes (Signed)
Please, add [id number]

## 2017-09-21 ENCOUNTER — Other Ambulatory Visit: Payer: Self-pay

## 2017-09-21 ENCOUNTER — Telehealth: Payer: Self-pay | Admitting: Obstetrics and Gynecology

## 2017-09-21 DIAGNOSIS — M2550 Pain in unspecified joint: Secondary | ICD-10-CM

## 2017-09-21 NOTE — Telephone Encounter (Signed)
Spoke with patient regarding benefit for follow up ultrasound. Patient understood and agreeable. Patient ready to schedule. Patient scheduled 10/20/17 with Dr Edward JollySilva. Patient aware of appointment date, arrival time and cancellation policy.  No further questions. Ok to close   cc: Dr Edward JollySilva  cc: Billie RuddySally Yeakley, RN

## 2017-09-22 LAB — GLUCOSE 6 PHOSPHATE DEHYDROGENASE: G-6PDH: 20.6 U/g Hgb — ABNORMAL HIGH (ref 7.0–20.5)

## 2017-09-29 ENCOUNTER — Ambulatory Visit
Admission: RE | Admit: 2017-09-29 | Discharge: 2017-09-29 | Disposition: A | Discharge status: Home / Self Care | Payer: Medicare Other | Source: Ambulatory Visit | Attending: Endocrinology | Admitting: Endocrinology

## 2017-09-29 DIAGNOSIS — M8580 Other specified disorders of bone density and structure, unspecified site: Secondary | ICD-10-CM

## 2017-10-13 ENCOUNTER — Other Ambulatory Visit: Payer: Medicare Other

## 2017-10-19 NOTE — Progress Notes (Signed)
GYNECOLOGY  VISIT   HPI: 72 y.o.   Married  Caucasian  female   [phone] with Patient's last menstrual period was 09/13/1984 (approximate).   here for pelvic ultrasound for ovarian cysts.    Does feel occasion left lower abdominal pressure.  No change.  Does not take any pain medication for it.   Cysts originally seen during MRI for evaluation of back.  Has a simple left ovarian cyst being followed yearly.  In December 2015, the left ovarian cyst measured 21 mm.  Right ovary has a follicular type cyst.  CA125 was 8 on 06/02/15.  Pelvic US 07/07/17: Uterus absent.  Left ovary with simple cyst 31 x 24 x 28 mm with echoes within, thin walled, no abnormal blood flow.  Right ovary with follicle 11 mm.  No free fluid.   CA125 8.8 on 07/07/17.   Of note has VIN I of the perineum.    GYNECOLOGIC HISTORY: Patient's last menstrual period was 09/13/1984 (approximate). Contraception: Postmenopausal Menopausal hormone therapy:  none Last mammogram:  12-20-16 Density B/Neg/BiRads1:TBC Last pap smear: 06-27-17 Neg                              06-03-16 ASCUS:Neg HR HPV        OB History    Gravida Para Term Preterm AB Living   3 3 3  0 0 3   SAB TAB Ectopic Multiple Live Births   0 0 0 0 3         Patient Active Problem List   Diagnosis Date Noted  . History of repair of right rotator cuff 03/03/2017  . Primary osteoarthritis of both hands/ has had bilateral CMC surgery  03/03/2017  . History of hypertension 03/03/2017  . History of hyperlipidemia 03/03/2017  . DDD (degenerative disc disease), lumbar/ history of lumbar surgery  03/03/2017  . Osteopenia of multiple sites 03/03/2017  . Chronic left shoulder pain 01/20/2017  . Primary osteoarthritis of both knees 01/20/2017  . Trapezius muscle spasm 01/20/2017  . Insomnia 01/20/2017  . Fatigue 01/20/2017  . Fibromyalgia 01/20/2017  . High risk medications (not anticoagulants) long-term use 01/20/2017  . PMR (polymyalgia rheumatica)  (HCC) 01/20/2017  . Left ovarian cyst 06/02/2015  . Degenerative joint disease, shoulder, right 08/02/2013  . Precordial pain 02/11/2012  . HTN (hypertension) 02/11/2012  . Hyperlipidemia 02/11/2012    Past Medical History:  Diagnosis Date  . Bilateral ovarian cysts 09/12/2014   bilateral simple ovarian cysts.  US due in January 2018.  . 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 Fibromyalgia   . HTN (hypertension)    under control with meds., has been on med. x 20 yr.  . Hyperlipidemia   . Hypothyroid   . IBS (irritable bowel syndrome)   . Impingement syndrome of right shoulder 07/2013  . PONV (postoperative nausea and vomiting)   . Rupture of right rotator cuff 07/2013  . VIN I (vulvar intraepithelial neoplasia I) 2017, 2018   this is from a perineal/vulvar biopsy.  . Vitamin B12 deficiency   . Wears partial dentures    lower    Past Surgical History:  Procedure Laterality Date  . ANTERIOR LAT LUMBAR FUSION  10/08/2008   L3-4  . BACK SURGERY    . BACK SURGERY  10-03-14   --[facility]  . COLONOSCOPY W/ BIOPSIES  11/2009   polyps  . COLONOSCOPY W/ BIOPSIES  11/2012   polyp, recheck in 3 years  . CYSTO  07/05/2006  . CYSTOCELE REPAIR  07/05/2006  . ESOPHAGOGASTRODUODENOSCOPY ENDOSCOPY  05/25/05   erythema and edema of gastric lumen  . FOOT SURGERY Right   . GANGLION CYST EXCISION Right    wrist  . INCONTINENCE SURGERY  07/05/2006   cystourethropexy  . ORIF FOREARM FRACTURE Right 09/2012  . SHOULDER ARTHROSCOPY WITH ROTATOR CUFF REPAIR AND SUBACROMIAL DECOMPRESSION Right 08/02/2013   Procedure: SHOULDER ARTHROSCOPY WITH ROTATOR CUFF REPAIR AND SUBACROMIAL DECOMPRESSION, EXTENSIVE DEBRIDEMENT (INCLUDING LABRUM), PARTIAL ACROMIPLASY WITH CORACOACROMIAL RELEASE, DISTAL CLAVICULECTOMY;  Surgeon: Loreta Ave, MD;  Location: [facility];  Service: Orthopedics;  Laterality: Right;  . STRABISMUS SURGERY Right 01/26/2008   2  other surgeries previously  . STRABISMUS SURGERY Right 06/03/2017   Procedure: REPAIR STRABISMUS RIGHT EYE;  Surgeon: Verne Carrow, MD;  Location: [facility];  Service: Ophthalmology;  Laterality: Right;  . Thumb surgery  2/13 & ?2010   Bilateral - states a joint was replaced  . TOTAL VAGINAL HYSTERECTOMY  1986 - or age 81   secondary AUB  . TUBAL LIGATION     laparosopic BTL age 19  . varicose veins Bilateral     Current Outpatient Medications  Medication Sig Dispense Refill  . benazepril (LOTENSIN) 20 MG tablet Take 20 mg by mouth daily.    . Calcium Carb-Cholecalciferol (CALCIUM 1000 + D PO) Take 1 tablet by mouth daily.    . Multiple Vitamin (MULTIVITAMIN) tablet Take 1 tablet by mouth daily.    . naproxen (NAPROSYN) 500 MG tablet Take 500 mg by mouth as needed.     . Omega-3 Fatty Acids (FISH OIL) 1000 MG CAPS Take 1 capsule by mouth daily.    . predniSONE (DELTASONE) 10 MG tablet Take 10 mg by mouth.    . vitamin B-12 (CYANOCOBALAMIN) 1000 MCG tablet Take 1,000 mcg by mouth every other day.    . Vitamin D, Ergocalciferol, (DRISDOL) 50000 units CAPS capsule Take 50,000 Units by mouth every 7 (seven) days.    . zaleplon (SONATA) 10 MG capsule 10 mg at bedtime as needed.     Marland Kitchen amitriptyline (ELAVIL) 25 MG tablet Take 25 mg by mouth.     No current facility-administered medications for this visit.      ALLERGIES: Penicillins and Sulfa antibiotics  Family History  Problem Relation Age of Onset  . Aortic aneurysm Father   . Hypertension Father   . Coronary artery disease Mother 39  . Hypertension Mother   . Hyperlipidemia Brother        brain tumor unknown if malignant    Social History   Socioeconomic History  . Marital status: Married    Spouse name: Not on file  . Number of children: 3  . Years of education: Not on file  . Highest education level: Not on file  Social Needs  . Financial resource strain: Not on file  . Food insecurity - worry: Not on  file  . Food insecurity - inability: Not on file  . Transportation needs - medical: Not on file  . Transportation needs - non-medical: Not on file  Occupational History    Employer: UNEMPLOYED  Tobacco Use  . Smoking status: Never Smoker  . Smokeless tobacco: Never Used  Substance and Sexual Activity  . Alcohol use: No    Alcohol/week: 0.0 oz  . Drug use: No  . Sexual activity: Not Currently    Partners:  Male    Birth control/protection: Surgical    Comment: Hyst  Other Topics Concern  . Not on file  Social History Narrative   Lives at home with husband and granddaughter.      ROS:  Pertinent items are noted in HPI.  PHYSICAL EXAMINATION:    BP 128/80   Pulse 70   Ht 5\' 8"  (1.727 m)   LMP 09/13/1984 (Approximate)   BMI 30.41 kg/m     General appearance: alert, cooperative and appears stated age   Pelvic US Uterus absent.  Normal vaginal cuff.  Right adnexa absent.  Left ovarian cyst - simple cyst - 26 x 21 x 21 mm. (Smaller in size. ) No abnormal flow.   No free fluid.   ASSESSMENT  Stable simple left ovarian cyst.  Hx VIN I.  PLAN  Discussed ovarian cysts. 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  We discussed surgery for an enlarging cyst or symptoms of increasing pain.  Will do one more yearly [place] and then stop.  Follow up for annual exam and prn.  Will recheck at annual exam.   An After Visit Summary was printed and given to the patient.  ___15___ minutes face to face time of which over 50% was spent in counseling.

## 2017-10-20 ENCOUNTER — Ambulatory Visit (INDEPENDENT_AMBULATORY_CARE_PROVIDER_SITE_OTHER): Payer: Medicare Other | Admitting: Obstetrics and Gynecology

## 2017-10-20 ENCOUNTER — Ambulatory Visit (INDEPENDENT_AMBULATORY_CARE_PROVIDER_SITE_OTHER): Payer: Medicare Other

## 2017-10-20 ENCOUNTER — Other Ambulatory Visit: Payer: Medicare Other

## 2017-10-20 ENCOUNTER — Encounter: Payer: Self-pay | Admitting: Obstetrics and Gynecology

## 2017-10-20 VITALS — BP 128/80 | HR 70 | Ht 68.0 in

## 2017-10-20 DIAGNOSIS — N83202 Unspecified ovarian cyst, left side: Secondary | ICD-10-CM

## 2017-10-20 NOTE — Patient Instructions (Signed)
Ovarian Cyst An ovarian cyst is a fluid-filled sac that forms on an ovary. The ovaries are small organs that produce eggs in women. Various types of cysts can form on the ovaries. Some may cause symptoms and require treatment. Most ovarian cysts go away on their own, are not cancerous (are benign), and do not cause problems. Common types of ovarian cysts include:  Functional (follicle) cysts. ? Occur during the menstrual cycle, and usually go away with the next menstrual cycle if you do not get pregnant. ? Usually cause no symptoms.  Endometriomas. ? Are cysts that form from the tissue that lines the uterus (endometrium). ? Are sometimes called "chocolate cysts" because they become filled with blood that turns brown. ? Can cause pain in the lower abdomen during intercourse and during your period.  Cystadenoma cysts. ? Develop from cells on the outside surface of the ovary. ? Can get very large and cause lower abdomen pain and pain with intercourse. ? Can cause severe pain if they twist or break open (rupture).  Dermoid cysts. ? Are sometimes found in both ovaries. ? May contain different kinds of body tissue, such as skin, teeth, hair, or cartilage. ? Usually do not cause symptoms unless they get very big.  Theca lutein cysts. ? Occur when too much of a certain hormone (human chorionic gonadotropin) is produced and overstimulates the ovaries to produce an egg. ? Are most common after having procedures used to assist with the conception of a baby (in vitro fertilization).  What are the causes? Ovarian cysts may be caused by:  Ovarian hyperstimulation syndrome. This is a condition that can develop from taking fertility medicines. It causes multiple large ovarian cysts to form.  Polycystic ovarian syndrome (PCOS). This is a common hormonal disorder that can cause ovarian cysts, as well as problems with your period or fertility.  What increases the risk? The following factors may make  you more likely to develop ovarian cysts:  Being overweight or obese.  Taking fertility medicines.  Taking certain forms of hormonal birth control.  Smoking.  What are the signs or symptoms? Many ovarian cysts do not cause symptoms. If symptoms are present, they may include:  Pelvic pain or pressure.  Pain in the lower abdomen.  Pain during sex.  Abdominal swelling.  Abnormal menstrual periods.  Increasing pain with menstrual periods.  How is this diagnosed? These cysts are commonly found during a routine pelvic exam. You may have tests to find out more about the cyst, such as:  Ultrasound.  X-ray of the pelvis.  CT scan.  MRI.  Blood tests.  How is this treated? Many ovarian cysts go away on their own without treatment. Your health care provider may want to check your cyst regularly for 2-3 months to see if it changes. If you are in menopause, it is especially important to have your cyst monitored closely because menopausal women have a higher rate of ovarian cancer. When treatment is needed, it may include:  Medicines to help relieve pain.  A procedure to drain the cyst (aspiration).  Surgery to remove the whole cyst.  Hormone treatment or birth control pills. These methods are sometimes used to help dissolve a cyst.  Follow these instructions at home:  Take over-the-counter and prescription medicines only as told by your health care provider.  Do not drive or use heavy machinery while taking prescription pain medicine.  Get regular pelvic exams and Pap tests as often as told by your health care   provider.  Return to your normal activities as told by your health care provider. Ask your health care provider what activities are safe for you.  Do not use any products that contain nicotine or tobacco, such as cigarettes and e-cigarettes. If you need help quitting, ask your health care provider.  Keep all follow-up visits as told by your health care provider.  This is important. Contact a health care provider if:  Your periods are late, irregular, or painful, or they stop.  You have pelvic pain that does not go away.  You have pressure on your bladder or trouble emptying your bladder completely.  You have pain during sex.  You have any of the following in your abdomen: ? A feeling of fullness. ? Pressure. ? Discomfort. ? Pain that does not go away. ? Swelling.  You feel generally ill.  You become constipated.  You lose your appetite.  You develop severe acne.  You start to have more body hair and facial hair.  You are gaining weight or losing weight without changing your exercise and eating habits.  You think you may be pregnant. Get help right away if:  You have abdominal pain that is severe or gets worse.  You cannot eat or drink without vomiting.  You suddenly develop a fever.  Your menstrual period is much heavier than usual. This information is not intended to replace advice given to you by your health care provider. Make sure you discuss any questions you have with your health care provider. Document Released: 08/30/2005 Document Revised: 03/19/2016 Document Reviewed: 02/01/2016 Elsevier Interactive Patient Education  2018 [employer]  

## 2017-10-20 NOTE — Progress Notes (Signed)
Encounter reviewed by Dr. Aayla Marrocco Amundson C. Silva.  

## 2017-10-31 IMAGING — DX DG LUMBAR SPINE 2-3V
3 series · 3 of 3 positions shown · non-contrast
Comparison: 10/08/2010.

CLINICAL DATA: New onset pain.  Lumbar spine surgery .

EXAM:
LUMBAR SPINE - 2-3 VIEW

[dg lumbar spine 2-3 views (1 of 3)]
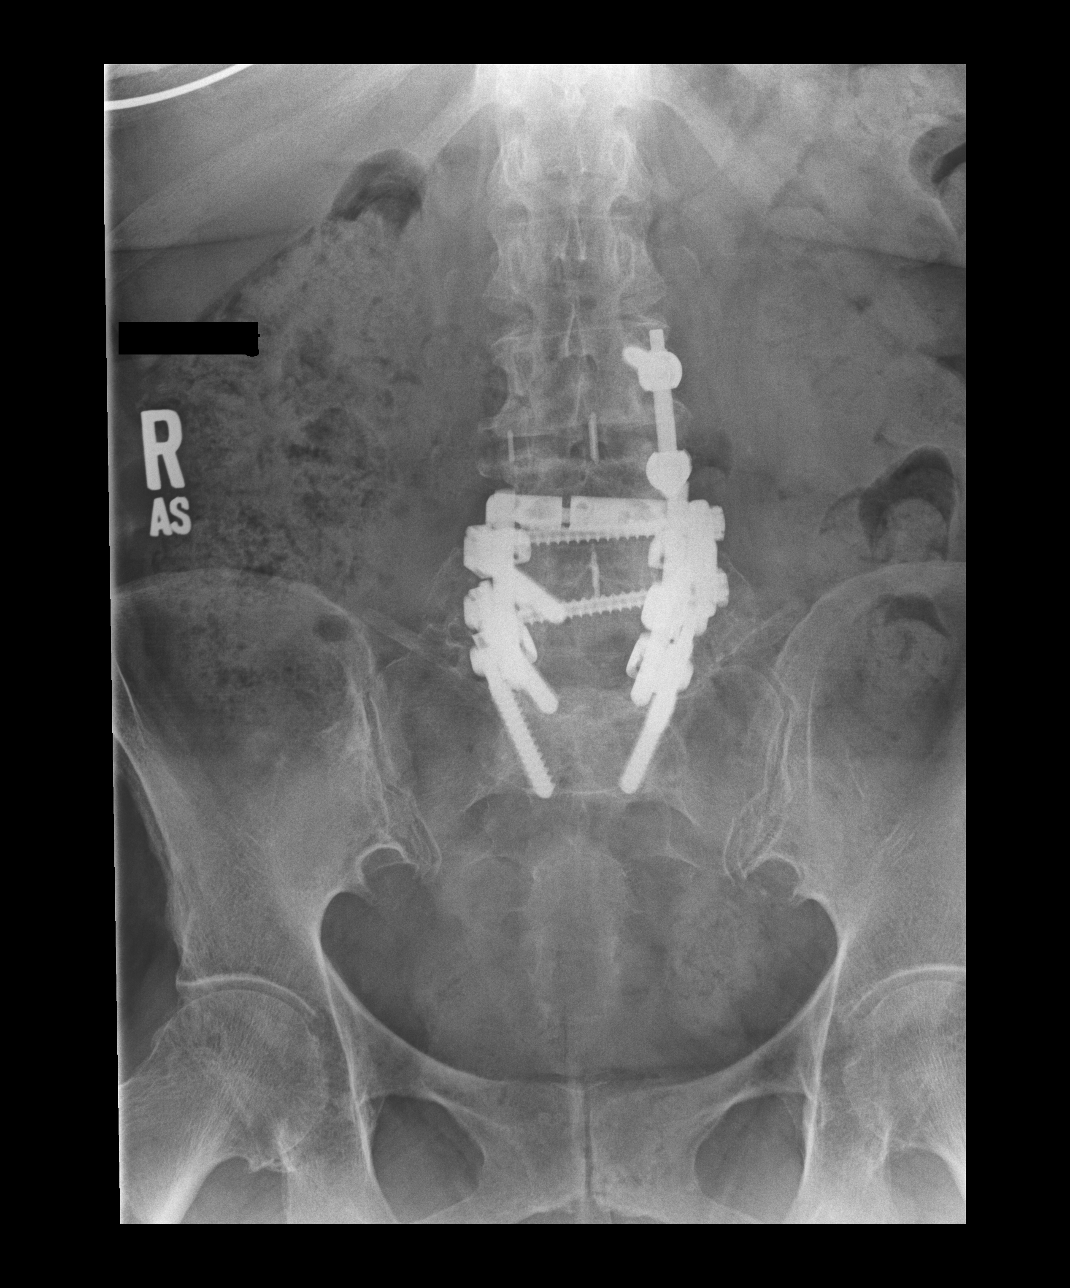

[dg lumbar spine 2-3 views (2 of 3)]
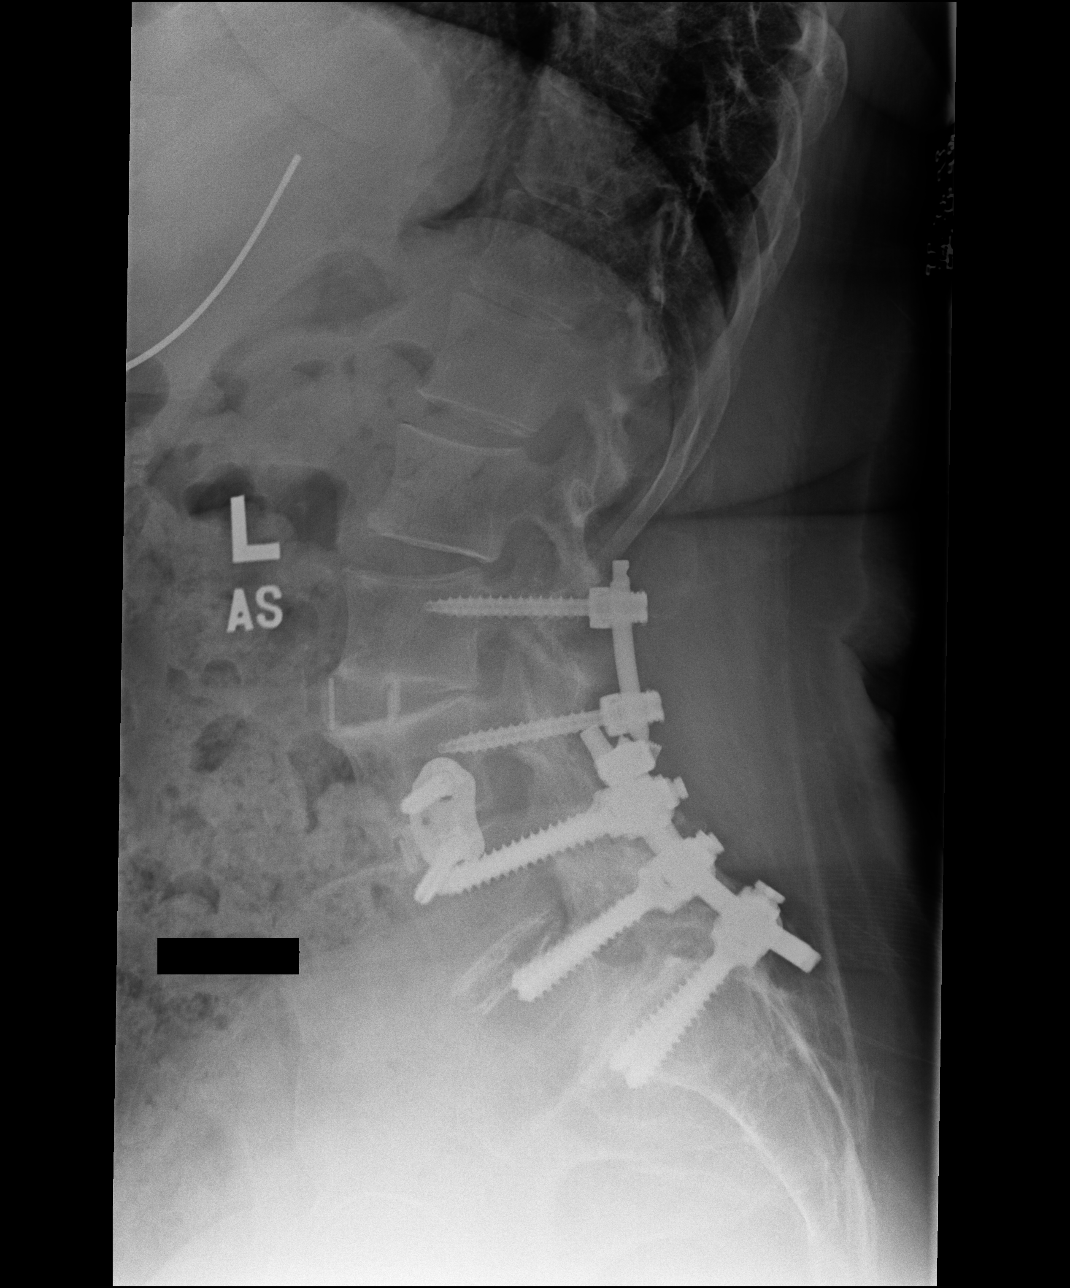

[dg lumbar spine 2-3 views (3 of 3)]
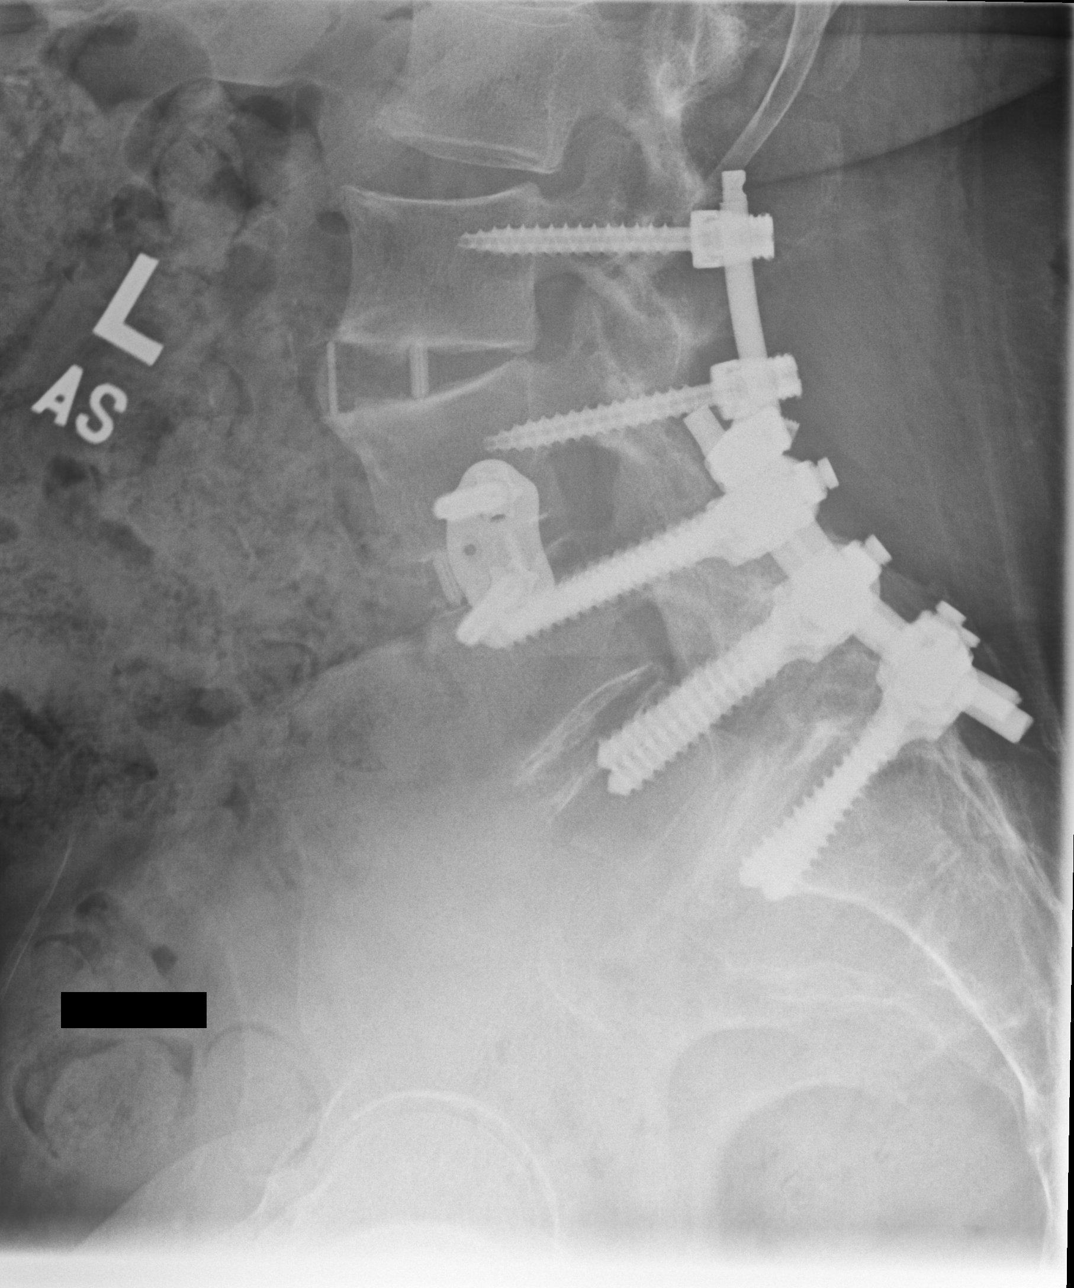

[3 of 3 positions shown; findings below may reference images not displayed]

FINDINGS: Lumbar spine numbered as per prior MRI of 10/08/2010. Postsurgical
changes lumbar spine with prior fusion L2 through S1 stable L5-S1
anterolisthesis. Stable diffuse degenerative change. No acute
abnormality identified.
IMPRESSION: Stable post scratched it postsurgical changes stable degenerative
changes lumbar spine with stable anterolisthesis L5-S1. No acute
abnormality identified.

## 2017-11-03 NOTE — Progress Notes (Signed)
Office Visit Note  Patient: [name]             Date of Birth: 02/21/1946           MRN: [id number]             PCP: Leonard Downing, MD Referring: Leonard Downing, * Visit Date: 11/17/2017 Occupation: @GUAROCC @    Subjective:  Medication management.   History of Present Illness: [name] is a 72 y.o. female with history of polymyalgia rheumatica, fibromyalgia and osteoarthritis.  She was in remission from polymyalgia rheumatica for 2 years until she had recurrence in October 2018.  At the time she was a started on prednisone by Dr. Carloyn Manner.  She continues to be on prednisone at 10 mg p.o. daily.  She has been doing better without any increased muscle pain or weakness now.  She has not tapered her prednisone further.  She does have some discomfort in her lower back due to underlying disc disease.  None of the other joints are painful currently.  Her fibromyalgia is well controlled.  Activities of Daily Living:  Patient reports morning stiffness for 1-2 hours.   Patient Denies nocturnal pain.  Difficulty dressing/grooming: Denies Difficulty climbing stairs: Reports Difficulty getting out of chair: Reports Difficulty using hands for taps, buttons, cutlery, and/or writing: Denies   Review of Systems  Constitutional: Negative for fatigue, night sweats, weight gain, weight loss and weakness.  HENT: Negative for mouth sores, trouble swallowing, trouble swallowing, mouth dryness and nose dryness.   Eyes: Positive for dryness. Negative for pain, redness and visual disturbance.  Respiratory: Negative for cough, shortness of breath and difficulty breathing.   Cardiovascular: Positive for swelling in legs/feet. Negative for chest pain, palpitations, hypertension and irregular heartbeat.  Gastrointestinal: Negative for blood in stool, constipation, diarrhea and nausea.  Endocrine: Negative for increased urination.  Genitourinary: Negative for pelvic pain and  vaginal dryness.  Musculoskeletal: Positive for morning stiffness. Negative for arthralgias, joint pain, joint swelling, myalgias, muscle weakness, muscle tenderness and myalgias.  Skin: Negative for color change, rash, hair loss, skin tightness, ulcers and sensitivity to sunlight.  Allergic/Immunologic: Negative for susceptible to infections.  Neurological: Negative for dizziness, numbness, headaches, memory loss and night sweats.  Hematological: Negative for bruising/bleeding tendency and swollen glands.  Psychiatric/Behavioral: Negative for depressed mood, confusion and sleep disturbance. The patient is not nervous/anxious.     PMFS History:  Patient Active Problem List   Diagnosis Date Noted  . History of repair of right rotator cuff 03/03/2017  . Primary osteoarthritis of both hands/ has had bilateral [place] surgery  03/03/2017  . History of hypertension 03/03/2017  . History of hyperlipidemia 03/03/2017  . DDD (degenerative disc disease), lumbar/ history of lumbar surgery  03/03/2017  . Osteopenia of multiple sites 03/03/2017  . Chronic left shoulder pain 01/20/2017  . Primary osteoarthritis of both knees 01/20/2017  . Trapezius muscle spasm 01/20/2017  . Insomnia 01/20/2017  . Fatigue 01/20/2017  . Fibromyalgia 01/20/2017  . High risk medications (not anticoagulants) long-term use 01/20/2017  . PMR (polymyalgia rheumatica) ([facility]) 01/20/2017  . Left ovarian cyst 06/02/2015  . Degenerative joint disease, shoulder, right 08/02/2013  . Precordial pain 02/11/2012  . HTN (hypertension) 02/11/2012  . Hyperlipidemia 02/11/2012    Past Medical History:  Diagnosis Date  . Bilateral ovarian cysts 09/12/2014   bilateral simple ovarian cysts.  [place] due in January 2018.  . 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 Fibromyalgia   .  HTN (hypertension)    under control with meds., has been on med. x 20 yr.  . Hyperlipidemia   . Hypothyroid   . IBS (irritable bowel syndrome)    . Impingement syndrome of right shoulder 07/2013  . PONV (postoperative nausea and vomiting)   . Rupture of right rotator cuff 07/2013  . VIN I (vulvar intraepithelial neoplasia I) 2017, 2018   this is from a perineal/vulvar biopsy.  . Vitamin B12 deficiency   . Wears partial dentures    lower    Family History  Problem Relation Age of Onset  . Aortic aneurysm Father   . Hypertension Father   . Coronary artery disease Mother 51  . Hypertension Mother   . Hyperlipidemia Brother        brain tumor unknown if malignant   Past Surgical History:  Procedure Laterality Date  . ANTERIOR LAT LUMBAR FUSION  10/08/2008   L3-4  . BACK SURGERY    . BACK SURGERY  10-03-14   --Dr. Glenna Fellows at [facility]  11/2009   polyps  . COLONOSCOPY W/ BIOPSIES  11/2012   polyp, recheck in 3 years  . CYSTO  07/05/2006  . CYSTOCELE REPAIR  07/05/2006  . ESOPHAGOGASTRODUODENOSCOPY ENDOSCOPY  05/25/05   erythema and edema of gastric lumen  . EYE SURGERY Right    muscle adjustment   . FOOT SURGERY Right   . GANGLION CYST EXCISION Right    wrist  . INCONTINENCE SURGERY  07/05/2006   cystourethropexy  . ORIF FOREARM FRACTURE Right 09/2012  . SHOULDER ARTHROSCOPY WITH ROTATOR CUFF REPAIR AND SUBACROMIAL DECOMPRESSION Right 08/02/2013   Procedure: SHOULDER ARTHROSCOPY WITH ROTATOR CUFF REPAIR AND SUBACROMIAL DECOMPRESSION, EXTENSIVE DEBRIDEMENT (INCLUDING LABRUM), PARTIAL ACROMIPLASY WITH CORACOACROMIAL RELEASE, DISTAL CLAVICULECTOMY;  Surgeon: Ninetta Lights, MD;  Location: [facility];  Service: Orthopedics;  Laterality: Right;  . STRABISMUS SURGERY Right 01/26/2008   2 other surgeries previously  . STRABISMUS SURGERY Right 06/03/2017   Procedure: REPAIR STRABISMUS RIGHT EYE;  Surgeon: Everitt Amber, MD;  Location: [facility];  Service: Ophthalmology;  Laterality: Right;  . Thumb surgery  2/13 & ?2010   Bilateral - states a joint was replaced    . TOTAL VAGINAL HYSTERECTOMY  1986 - or age 64   secondary AUB  . TUBAL LIGATION     laparosopic BTL age 4  . varicose veins Bilateral    Social History   Social History Narrative   Lives at home with husband and granddaughter.       Objective: Vital Signs: BP 129/79 (BP Location: Left Arm, Patient Position: Sitting, Cuff Size: Normal)   Pulse 76   Resp 16   Ht 5' 8"  (1.727 m)   Wt 204 lb (92.5 kg)   LMP 09/13/1984 (Approximate)   BMI 31.02 kg/m    Physical Exam  Constitutional: She is oriented to person, place, and time. She appears well-developed and well-nourished.  HENT:  Head: Normocephalic and atraumatic.  Eyes: Conjunctivae and EOM are normal.  Neck: Normal range of motion.  Cardiovascular: Normal rate, regular rhythm, normal heart sounds and intact distal pulses.  Pulmonary/Chest: Effort normal and breath sounds normal.  Abdominal: Soft. Bowel sounds are normal.  Lymphadenopathy:    She has no cervical adenopathy.  Neurological: She is alert and oriented to person, place, and time.  Skin: Skin is warm and dry. Capillary refill takes less than 2 seconds.  Psychiatric: She has a normal  mood and affect. Her behavior is normal.  Nursing note and vitals reviewed.    Musculoskeletal Exam: C-spine thoracic lumbar spine good range of motion.  She is some discomfort range of motion of her lumbar spine.  Shoulder joints elbow joints wrist joint MCPs PIPs DIPs with good range of motion.  She has arthritis changes in her bilateral CMC joints and postsurgical changes in her left CMC joint.  She has good range of motion of her hip joints knee joints ankles MTPs PIPs with no synovitis.  She has some osteoarthritic changes in her bilateral feet.  CDAI Exam: No CDAI exam completed.    Investigation: No additional findings. CBC Latest Ref Rng & Units 09/12/2017 03/04/2017 08/02/2013  WBC 3.8 - 10.8 Thousand/uL 10.2 8.3 -  Hemoglobin 11.7 - 15.5 g/dL 12.2 12.3 13.5   Hematocrit 35.0 - 45.0 % 38.1 39.5 -  Platelets 140 - 400 Thousand/uL 317 326 -   CMP Latest Ref Rng & Units 09/12/2017 09/12/2017 03/04/2017  Glucose 65 - 99 mg/dL - 113(H) 90  BUN 7 - 25 mg/dL - 18 17  Creatinine 0.50 - 0.99 mg/dL - 0.70 0.70  Sodium 135 - 146 mmol/L - 142 142  Potassium 3.5 - 5.3 mmol/L - 4.2 4.1  Chloride 98 - 110 mmol/L - 106 108  CO2 20 - 32 mmol/L - 25 24  Calcium 8.6 - 10.4 mg/dL - 9.5 9.3  Total Protein 6.1 - 8.1 g/dL 7.4 7.2 7.0  Total Bilirubin 0.2 - 1.2 mg/dL - 0.3 0.4  Alkaline Phos 33 - 130 U/L - - 92  AST 10 - 35 U/L - 19 20  ALT 6 - 29 U/L - 22 18   September 12, 2017 SPEP normal, ESR 22, CK 144, TSH normal, G6PD 20.6 normal, RF negative, anti-CCP negative, 14 3 3  n 0.3 mildly elevated. March 04, 2017 vitamin D 48  September 29, 2017 DEXA T score -1.3, BMD 0.51 at left femoral neck Imaging: US Transvaginal Non-ob  Result Date: 10/20/2017 SEE PROGRESS NOTE   Speciality Comments: No specialty comments available.    Procedures:  No procedures performed Allergies: Penicillins and Sulfa antibiotics   Assessment / Plan:     Visit Diagnoses: PMR (polymyalgia rheumatica) ([facility]) -patient was treated for polymyalgia rheumatica in the past and was in remission for 2 years.  She had recurrence of polymyalgia in October 2018.  She has been on prednisone 10 mg p.o. daily since then.  She is clinically doing very well today without any symptoms of muscle weakness or tenderness.  We had detailed discussion regarding polymyalgia and tapering of prednisone.  My plans to taper her prednisone by 1 mg every month.  I will give her prescription for prednisone 1 mg tablets to do that.  We also discussed use of Plaquenil as a steroid sparing agent and to prevent recurrence of polymyalgia.  She was in agreement.  Indication side effects contraindications were discussed.  She states she has had sulfa allergy in the past but she is uncertain if it was really related to sulfa.  I  have advised her to try a lower dose of Plaquenil and if she has no allergies she can proceed to full dose of 200 mg p.o. twice daily.  I handout was given and consent was taken on Plaquenil.  She is been also advised to get eye exams on a regular basis.  Patient was counseled on the purpose, proper use, and adverse effects of hydroxychloroquine including nausea/diarrhea, skin  rash, headaches, and sun sensitivity.  Discussed importance of annual eye exams while on hydroxychloroquine to monitor to ocular toxicity and discussed importance of frequent laboratory monitoring.  Provided patient with eye exam form for baseline ophthalmologic exam.  Provided patient with educational materials on hydroxychloroquine and answered all questions.  Patient consented to hydroxychloroquine.  Will upload consent in the media tab.    Fibromyalgia: Currently not very symptomatic.  High risk medication use: She will get eye exam is scheduled.  She will also need labs in 1 month, 3 months and then every 5 months.  On prednisone therapy - 10 mg po qd.  She will be tapering prednisone by 1 mg every month.  Other fatigue: Related to insomnia  Other insomnia: Good sleep hygiene discussed.  Primary osteoarthritis of both hands/ has had bilateral CMC surgery: She has chronic discomfort but doing fairly well currently.  Primary osteoarthritis of right shoulder - History of right rotator cuff tear repair.  Primary osteoarthritis of both knees: Chronic pain  DDD (degenerative disc disease), lumbar - history of lumbar surgery: Chronic pain  Osteopenia of multiple sites -her most recent DEXA was reviewed which was consistent with osteopenia.  Use of calcium and vitamin D was discussed.  History of hypertension  History of hyperlipidemia    Orders: Orders Placed This Encounter  Procedures  . Sedimentation rate  . COMPLETE METABOLIC PANEL WITH GFR  . CBC with Differential/Platelet   Meds ordered this encounter    Medications  . hydroxychloroquine (PLAQUENIL) 200 MG tablet    Sig: Take 1 tablet (200 mg total) by mouth 2 (two) times daily.    Dispense:  60 tablet    Refill:  2  . predniSONE (DELTASONE) 5 MG tablet    Sig: Take 1 tablet by mouth daily.    Dispense:  30 tablet    Refill:  0  . predniSONE (DELTASONE) 1 MG tablet    Sig: Take 4 tablets by mouth daily. Follow taper schedule.    Dispense:  120 tablet    Refill:  0    Face-to-face time spent with patient was 30 minutes.  Greater than 50% of time was spent in counseling and coordination of care.  Follow-Up Instructions: Return in about 3 months (around 02/17/2018) for PMR, OA,DD, OPenia.   Bo Merino, MD  Note - This record has been created using Editor, commissioning.  Chart creation errors have been sought, but may not always  have been located. Such creation errors do not reflect on  the standard of medical care.

## 2017-11-17 ENCOUNTER — Ambulatory Visit: Payer: Medicare Other | Admitting: Rheumatology

## 2017-11-17 ENCOUNTER — Encounter: Payer: Self-pay | Admitting: Rheumatology

## 2017-11-17 VITALS — BP 129/79 | HR 76 | Resp 16 | Ht 68.0 in | Wt 204.0 lb

## 2017-11-17 DIAGNOSIS — G4709 Other insomnia: Secondary | ICD-10-CM

## 2017-11-17 DIAGNOSIS — M797 Fibromyalgia: Secondary | ICD-10-CM

## 2017-11-17 DIAGNOSIS — M5136 Other intervertebral disc degeneration, lumbar region: Secondary | ICD-10-CM

## 2017-11-17 DIAGNOSIS — Z8679 Personal history of other diseases of the circulatory system: Secondary | ICD-10-CM | POA: Diagnosis not present

## 2017-11-17 DIAGNOSIS — Z79899 Other long term (current) drug therapy: Secondary | ICD-10-CM | POA: Diagnosis not present

## 2017-11-17 DIAGNOSIS — Z8639 Personal history of other endocrine, nutritional and metabolic disease: Secondary | ICD-10-CM

## 2017-11-17 DIAGNOSIS — R5383 Other fatigue: Secondary | ICD-10-CM

## 2017-11-17 DIAGNOSIS — M19041 Primary osteoarthritis, right hand: Secondary | ICD-10-CM

## 2017-11-17 DIAGNOSIS — M19011 Primary osteoarthritis, right shoulder: Secondary | ICD-10-CM | POA: Diagnosis not present

## 2017-11-17 DIAGNOSIS — M170 Bilateral primary osteoarthritis of knee: Secondary | ICD-10-CM

## 2017-11-17 DIAGNOSIS — M353 Polymyalgia rheumatica: Secondary | ICD-10-CM | POA: Diagnosis not present

## 2017-11-17 DIAGNOSIS — Z7952 Long term (current) use of systemic steroids: Secondary | ICD-10-CM | POA: Diagnosis not present

## 2017-11-17 DIAGNOSIS — M51369 Other intervertebral disc degeneration, lumbar region without mention of lumbar back pain or lower extremity pain: Secondary | ICD-10-CM

## 2017-11-17 DIAGNOSIS — M8589 Other specified disorders of bone density and structure, multiple sites: Secondary | ICD-10-CM | POA: Diagnosis not present

## 2017-11-17 DIAGNOSIS — M19042 Primary osteoarthritis, left hand: Secondary | ICD-10-CM

## 2017-11-17 LAB — SEDIMENTATION RATE: Sed Rate: 17 mm/h (ref 0–30)

## 2017-11-17 MED ORDER — HYDROXYCHLOROQUINE SULFATE 200 MG PO TABS: 200.0000 mg | ORAL_TABLET | Freq: Two times a day (BID) | ORAL | 2 refills | Status: DC

## 2017-11-17 MED ORDER — PREDNISONE 1 MG PO TABS: ORAL_TABLET | ORAL | 0 refills | Status: DC

## 2017-11-17 MED ORDER — PREDNISONE 5 MG PO TABS: ORAL_TABLET | ORAL | 0 refills | Status: DC

## 2017-11-17 NOTE — Patient Instructions (Addendum)
Hydroxychloroquine tablets What is this medicine? HYDROXYCHLOROQUINE (hye drox ee KLOR oh kwin) is used to treat rheumatoid arthritis and systemic lupus erythematosus. It is also used to treat malaria. This medicine may be used for other purposes; ask your health care provider or pharmacist if you have questions. COMMON BRAND NAME(S): Plaquenil, Quineprox What should I tell my health care provider before I take this medicine? They need to know if you have any of these conditions: -diabetes -eye disease, vision problems -G6PD deficiency -history of blood diseases -history of irregular heartbeat -if you often drink alcohol -kidney disease -liver disease -porphyria -psoriasis -seizures -an unusual or allergic reaction to chloroquine, hydroxychloroquine, other medicines, foods, dyes, or preservatives -pregnant or trying to get pregnant -breast-feeding How should I use this medicine? Take this medicine by mouth with a glass of water. Follow the directions on the prescription label. Avoid taking antacids within 4 hours of taking this medicine. It is best to separate these medicines by at least 4 hours. Do not cut, crush or chew this medicine. You can take it with or without food. If it upsets your stomach, take it with food. Take your medicine at regular intervals. Do not take your medicine more often than directed. Take all of your medicine as directed even if you think you are better. Do not skip doses or stop your medicine early. Talk to your pediatrician regarding the use of this medicine in children. While this drug may be prescribed for selected conditions, precautions do apply. Overdosage: If you think you have taken too much of this medicine contact a poison control center or emergency room at once. NOTE: This medicine is only for you. Do not share this medicine with others. What if I miss a dose? If you miss a dose, take it as soon as you can. If it is almost time for your next dose,  take only that dose. Do not take double or extra doses. What may interact with this medicine? Do not take this medicine with any of the following medications: -cisapride -dofetilide -dronedarone -live virus vaccines -penicillamine -pimozide -thioridazine -ziprasidone This medicine may also interact with the following medications: -ampicillin -antacids -cimetidine -cyclosporine -digoxin -medicines for diabetes, like insulin, glipizide, glyburide -medicines for seizures like carbamazepine, phenobarbital, phenytoin -mefloquine -methotrexate -other medicines that prolong the QT interval (cause an abnormal heart rhythm) -praziquantel This list may not describe all possible interactions. Give your health care provider a list of all the medicines, herbs, non-prescription drugs, or dietary supplements you use. Also tell them if you smoke, drink alcohol, or use illegal drugs. Some items may interact with your medicine. What should I watch for while using this medicine? Tell your doctor or healthcare professional if your symptoms do not start to get better or if they get worse. Avoid taking antacids within 4 hours of taking this medicine. It is best to separate these medicines by at least 4 hours. Tell your doctor or health care professional right away if you have any change in your eyesight. Your vision and blood may be tested before and during use of this medicine. This medicine can make you more sensitive to the sun. Keep out of the sun. If you cannot avoid being in the sun, wear protective clothing and use sunscreen. Do not use sun lamps or tanning beds/booths. What side effects may I notice from receiving this medicine? Side effects that you should report to your doctor or health care professional as soon as possible: -allergic reactions like skin rash,   itching or hives, swelling of the face, lips, or tongue -changes in vision -decreased hearing or ringing of the ears -redness,  blistering, peeling or loosening of the skin, including inside the mouth -seizures -sensitivity to light -signs and symptoms of a dangerous change in heartbeat or heart rhythm like chest pain; dizziness; fast or irregular heartbeat; palpitations; feeling faint or lightheaded, falls; breathing problems -signs and symptoms of liver injury like dark yellow or brown urine; general ill feeling or flu-like symptoms; light-colored stools; loss of appetite; nausea; right upper belly pain; unusually weak or tired; yellowing of the eyes or skin -signs and symptoms of low blood sugar such as feeling anxious; confusion; dizziness; increased hunger; unusually weak or tired; sweating; shakiness; cold; irritable; headache; blurred vision; fast heartbeat; loss of consciousness -uncontrollable head, mouth, neck, arm, or leg movements Side effects that usually do not require medical attention (report to your doctor or health care professional if they continue or are bothersome): -anxious -diarrhea -dizziness -hair loss -headache -irritable -loss of appetite -nausea, vomiting -stomach pain This list may not describe all possible side effects. Call your doctor for medical advice about side effects. You may report side effects to FDA at 1-800-FDA-1088. Where should I keep my medicine? Keep out of the reach of children. In children, this medicine can cause overdose with small doses. Store at room temperature between 15 and 30 degrees C (59 and 86 degrees F). Protect from moisture and light. Throw away any unused medicine after the expiration date. NOTE: This sheet is a summary. It may not cover all possible information. If you have questions about this medicine, talk to your doctor, pharmacist, or health care provider.  2018 Elsevier/Gold Standard (2016-04-14 14:16:15)   Standing Labs We placed an order today for your standing lab work.    Please come back and get your standing labs in 1 month, 3 months, then  5 months.  We have open lab Monday through Friday from 8:30-11:30 AM and 1:30-4 PM at the office of Dr. Pollyann SavoyShaili Shenica Holzheimer.   The office is located at [address], [address], [place], [place] [address] No appointment is necessary.   Labs are drawn by [facility].  You may receive a bill from [facility] for your lab work. If you have any questions regarding directions or hours of operation,  please call [phone].    Please talk to primary care physician regarding getting your pneumococcal vaccine completed.

## 2017-11-18 NOTE — Progress Notes (Signed)
ESR is normal.  She should be able to taper prednisone as planned.

## 2017-12-25 ENCOUNTER — Other Ambulatory Visit: Payer: Self-pay | Admitting: Rheumatology

## 2017-12-26 NOTE — Telephone Encounter (Signed)
Last Visit: 11/17/17 Next Visit: 02/14/18  Patient is tapering prednisone. Prednisone 8 mg.  Okay to refill per Dr. Corliss Skainseveshwar

## 2018-01-18 ENCOUNTER — Other Ambulatory Visit: Payer: Self-pay | Admitting: Nurse Practitioner

## 2018-01-18 DIAGNOSIS — M5417 Radiculopathy, lumbosacral region: Secondary | ICD-10-CM

## 2018-01-22 ENCOUNTER — Other Ambulatory Visit: Payer: Self-pay | Admitting: Rheumatology

## 2018-01-23 NOTE — Telephone Encounter (Signed)
Last Visit: 11/17/17 Next Visit: 02/14/18  Patient is tapering prednisone. Prednisone 7 mg.  Okay to refill per Dr. Corliss Skains

## 2018-01-26 ENCOUNTER — Ambulatory Visit
Admission: RE | Admit: 2018-01-26 | Discharge: 2018-01-26 | Disposition: A | Discharge status: Home / Self Care | Payer: Medicare Other | Source: Ambulatory Visit | Attending: Nurse Practitioner | Admitting: Nurse Practitioner

## 2018-01-26 DIAGNOSIS — M5417 Radiculopathy, lumbosacral region: Secondary | ICD-10-CM

## 2018-01-26 MED ORDER — METHYLPREDNISOLONE ACETATE 40 MG/ML INJ SUSP (RADIOLOG
120.0000 mg | Freq: Once | INTRAMUSCULAR | Status: AC
  Administered 2018-01-26: 120 mg via EPIDURAL

## 2018-01-26 MED ORDER — IOPAMIDOL (ISOVUE-M 200) INJECTION 41%
1.0000 mL | Freq: Once | INTRAMUSCULAR | Status: AC
  Administered 2018-01-26: 1 mL via EPIDURAL

## 2018-02-01 NOTE — Progress Notes (Signed)
Office Visit Note  Patient: [name]             Date of Birth: [birth year]           MRN: [id number]             PCP: Kaleen Mask, MD Referring: Kaleen Mask, * Visit Date: 02/14/2018 Occupation: @    Subjective:  Lower back discomfort   History of Present Illness: [name] is a 72 y.o. female with history of PMR, fibromyalgia, and osteoarthritis.  Patient continues to take Plaquenil 200 mg twice daily.  She states she is currently taking prednisone 5 mg daily and tapering by 1 mg every month.  She denies any recent flares of PMR.  She states she continues to have some muscle weakness in the lower extremities but does not have much difficulty getting up from a chair.  She states that she does have difficulty going up stairs due to her back pain and muscle weakness.  She states that about 3 weeks ago she had to have an epidural cortisone injection at [facility] imaging due to lower back pain and right-sided sciatica.  She states that the injection helped considerably.  She states that she is not having any lower back pain right now.  She denies any muscle aches or muscle tenderness at this time to.  Denies any joint pain or joint swelling.  She denies any CMC joint pain.  She states that she has some joint stiffness by the end of the day but no morning stiffness.  She states that she has developed a mild rash on her arms and is going to schedule an appointment with her dermatologist.   Activities of Daily Living:  Patient reports morning stiffness for 0  minutes.   Patient Denies nocturnal pain.  Difficulty dressing/grooming: Denies Difficulty climbing stairs: Reports Difficulty getting out of chair: Reports Difficulty using hands for taps, buttons, cutlery, and/or writing: Denies   Review of Systems  Constitutional: Negative for fatigue.  HENT: Negative for mouth sores, mouth dryness and nose dryness.   Eyes: Positive for dryness. Negative  for pain and visual disturbance.  Respiratory: Negative for cough, hemoptysis, shortness of breath and difficulty breathing.   Cardiovascular: Negative for chest pain, palpitations, hypertension and swelling in legs/feet.  Gastrointestinal: Negative for blood in stool, constipation and diarrhea.  Endocrine: Negative for increased urination.  Genitourinary: Negative for painful urination.  Musculoskeletal: Positive for myalgias, muscle weakness and myalgias. Negative for arthralgias, joint pain, joint swelling, morning stiffness and muscle tenderness.  Skin: Negative for color change, pallor, rash, hair loss, nodules/bumps, skin tightness, ulcers and sensitivity to sunlight.  Allergic/Immunologic: Negative for susceptible to infections.  Neurological: Negative for dizziness, numbness, headaches and weakness.  Hematological: Negative for swollen glands.  Psychiatric/Behavioral: Negative for depressed mood and sleep disturbance. The patient is not nervous/anxious.     PMFS History:  Patient Active Problem List   Diagnosis Date Noted  . History of repair of right rotator cuff 03/03/2017  . Primary osteoarthritis of both hands/ has had bilateral CMC surgery  03/03/2017  . History of hypertension 03/03/2017  . History of hyperlipidemia 03/03/2017  . DDD (degenerative disc disease), lumbar/ history of lumbar surgery  03/03/2017  . Osteopenia of multiple sites 03/03/2017  . Chronic left shoulder pain 01/20/2017  . Primary osteoarthritis of both knees 01/20/2017  . Trapezius muscle spasm 01/20/2017  . Insomnia 01/20/2017  . Fatigue 01/20/2017  . Fibromyalgia 01/20/2017  .  High risk medications (not anticoagulants) long-term use 01/20/2017  . PMR (polymyalgia rheumatica) (HCC) 01/20/2017  . Left ovarian cyst 06/02/2015  . Degenerative joint disease, shoulder, right 08/02/2013  . Precordial pain 02/11/2012  . HTN (hypertension) 02/11/2012  . Hyperlipidemia 02/11/2012    Past Medical  History:  Diagnosis Date  . Bilateral ovarian cysts 09/12/2014   bilateral simple ovarian cysts.  [place] due in January 2018.  . 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 Fibromyalgia   . HTN (hypertension)    under control with meds., has been on med. x 20 yr.  . Hyperlipidemia   . Hypothyroid   . IBS (irritable bowel syndrome)   . Impingement syndrome of right shoulder 07/2013  . PONV (postoperative nausea and vomiting)   . Rupture of right rotator cuff 07/2013  . VIN I (vulvar intraepithelial neoplasia I) 2017, 2018   this is from a perineal/vulvar biopsy.  . Vitamin B12 deficiency   . Wears partial dentures    lower    Family History  Problem Relation Age of Onset  . Aortic aneurysm Father   . Hypertension Father   . Coronary artery disease Mother 64  . Hypertension Mother   . Hyperlipidemia Brother        brain and kidney tumor, no treatment needed    Past Surgical History:  Procedure Laterality Date  . ANTERIOR LAT LUMBAR FUSION  10/08/2008   L3-4  . BACK SURGERY    . BACK SURGERY  10-03-14   --Dr. Trey Sailors at [facility]  . COLONOSCOPY W/ BIOPSIES  11/2009   polyps  . COLONOSCOPY W/ BIOPSIES  11/2012   polyp, recheck in 3 years  . CYSTO  07/05/2006  . CYSTOCELE REPAIR  07/05/2006  . ESOPHAGOGASTRODUODENOSCOPY ENDOSCOPY  05/25/05   erythema and edema of gastric lumen  . EYE SURGERY Right    muscle adjustment   . FOOT SURGERY Right   . GANGLION CYST EXCISION Right    wrist  . INCONTINENCE SURGERY  07/05/2006   cystourethropexy  . ORIF FOREARM FRACTURE Right 09/2012  . SHOULDER ARTHROSCOPY WITH ROTATOR CUFF REPAIR AND SUBACROMIAL DECOMPRESSION Right 08/02/2013   Procedure: SHOULDER ARTHROSCOPY WITH ROTATOR CUFF REPAIR AND SUBACROMIAL DECOMPRESSION, EXTENSIVE DEBRIDEMENT (INCLUDING LABRUM), PARTIAL ACROMIPLASY WITH CORACOACROMIAL RELEASE, DISTAL CLAVICULECTOMY;  Surgeon: Loreta Ave, MD;  Location: [facility];  Service:  Orthopedics;  Laterality: Right;  . STRABISMUS SURGERY Right 01/26/2008   2 other surgeries previously  . STRABISMUS SURGERY Right 06/03/2017   Procedure: REPAIR STRABISMUS RIGHT EYE;  Surgeon: Verne Carrow, MD;  Location: [facility];  Service: Ophthalmology;  Laterality: Right;  . Thumb surgery  2/13 & ?2010   Bilateral - states a joint was replaced  . TOTAL VAGINAL HYSTERECTOMY  1986 - or age 89   secondary AUB  . TUBAL LIGATION     laparosopic BTL age 71  . varicose veins Bilateral    Social History   Social History Narrative   Lives at home with husband and granddaughter.       Objective: Vital Signs: BP 136/90 (BP Location: Left Arm, Patient Position: Sitting, Cuff Size: Normal)   Pulse 72   Resp 14   Ht 5' 7.32" (1.71 m)   Wt 202 lb (91.6 kg)   LMP 09/13/1984 (Approximate)   BMI 31.33 kg/m    Physical Exam  Constitutional: She is oriented to person, place, and time. She appears well-developed and well-nourished.  HENT:  Head: Normocephalic and atraumatic.  Eyes: Conjunctivae and EOM are normal.  Neck: Normal range of motion.  Cardiovascular: Normal rate, regular rhythm, normal heart sounds and intact distal pulses.  Pulmonary/Chest: Effort normal and breath sounds normal.  Abdominal: Soft. Bowel sounds are normal.  Lymphadenopathy:    She has no cervical adenopathy.  Neurological: She is alert and oriented to person, place, and time.  Skin: Skin is warm and dry. Capillary refill takes less than 2 seconds.  Psychiatric: She has a normal mood and affect. Her behavior is normal.  Nursing note and vitals reviewed.    Musculoskeletal Exam: C-spine limited ROM especially with lateral rotation to the left.  Limited range of motion of thoracic and lumbar spine.  No midline spinal tenderness.  No SI joint tenderness.  Shoulder joints, elbow joints, wrist joints, MCPs, PIPs, DIPs good range of motion with no synovitis.  PIP and DIP synovial thickening  consistent with osteoarthritis.  Bilateral CMC surgical scar.  Hip joints and knee joints good range of motion.  No warmth or effusion of knee joints. Ankle joints, MTPs, PIPs, and DIPs good ROM with no synovitis.  No tenderness of trochanteric bursa bilaterally. Good upper and lower extremity muscle strength.  Dorsal spur present on bilateral feet.    CDAI Exam: No CDAI exam completed.    Investigation: No additional findings. CBC Latest Ref Rng & Units 09/12/2017 03/04/2017 08/02/2013  WBC 3.8 - 10.8 Thousand/uL 10.2 8.3 -  Hemoglobin 11.7 - 15.5 g/dL 96.0 45.4 09.8  Hematocrit 35.0 - 45.0 % 38.1 39.5 -  Platelets 140 - 400 Thousand/uL 317 326 -   CMP Latest Ref Rng & Units 09/12/2017 09/12/2017 03/04/2017  Glucose 65 - 99 mg/dL - 119(J) 90  BUN 7 - 25 mg/dL - 18 17  Creatinine 4.78 - 0.99 mg/dL - 2.95 6.21  Sodium 308 - 146 mmol/L - 142 142  Potassium 3.5 - 5.3 mmol/L - 4.2 4.1  Chloride 98 - 110 mmol/L - 106 108  CO2 20 - 32 mmol/L - 25 24  Calcium 8.6 - 10.4 mg/dL - 9.5 9.3  Total Protein 6.1 - 8.1 g/dL 7.4 7.2 7.0  Total Bilirubin 0.2 - 1.2 mg/dL - 0.3 0.4  Alkaline Phos 33 - 130 U/L - - 92  AST 10 - 35 U/L - 19 20  ALT 6 - 29 U/L - 22 18     Imaging: Dg Inject Diag/thera/inc Needle/cath/plc Epi/lumb/sac W/img  Result Date: 01/26/2018 CLINICAL DATA:  Spondylosis without myelopathy. Lumbosacral fusion. Right hip and thigh pain. FLUOROSCOPY TIME:  0 minutes 35 seconds. 56.68 micro gray meter squared EXAM: CAUDAL EPIDURAL INJECTION Utilizing a caudal approach, the skin overlying the sacral hiatus was cleansed and anesthetized. A 20 gauge epidural needle was advanced into the sacral epidural space. Injection of Isovue-M 200 shows a good epidural pattern with spread up to L5-S1. No vascular opacification is seen. 120 mg of Depo-Medrol mixed with 3 ml of normal saline and 3 ml of 1% Lidocaine were instilled. The procedure was well-tolerated, and the patient was discharged thirty  minutes following the injection in good condition. IMPRESSION: Technically successful caudal epidural injection #1. Injectate directed towards the right. I think there is considerable potential that the patient could have symptomatic disease at the L1-2 level related to facet arthropathy and bulging of the disc. The pain could possibly be due to a degree facet syndrome pain. If she does not get a good response from this lower injection, consider either a  right-sided L1-2 epidural and or right facet injection at that level. Electronically Signed   By: Paulina Fusi M.D.   On: 01/26/2018 09:25    Speciality Comments: PLQ Eye Exam: 12/08/17 WNL @ [facility] Follow up in 1 year    Procedures:  No procedures performed Allergies: Penicillins and Sulfa antibiotics   Assessment / Plan:     Visit Diagnoses: PMR (polymyalgia rheumatica) (HCC) - Recurrence in October 2018.  She has not had any recent recurrence of PMR.  She has 5 out of 5 muscle strength of upper and lower extremities.  She has no muscle aches or muscle tenderness at this time.  She had no difficulty getting up from a chair unassisted.  She has had no joint pain or joint swelling.  She is currently on prednisone 5 mg by mouth daily and tapering by 1 mg every month.  She has not noticed any worsening of symptoms since continuing to taper prednisone. A refill of prednisone 5 mg was sent to the pharmacy.  Sed rate was 17 on 11/17/2017.  She is advised to notify us if she develops symptoms of a PMR flare.  Fibromyalgia: Her fibromyalgia has been well controlled.  She has no muscle aches or muscle tenderness at this time.  She is been sleeping better since being on amitriptyline.  She is also been exercising on a regular basis by swimming in the pool.  She is encouraged to continue to exercise on a regular basis.  We also suggested that she try using a stationary bike to work on lower extremity muscle strengthening.  High risk medications (not  anticoagulants) long-term use - PLQ 200 mg BID. PLQ Eye Exam: 12/08/17 WNL @ [facility] Follow up in 1 year.  CBC and CMP was drawn today to monitor for drug toxicity.Plan: CBC with Differential/Platelet, COMPLETE METABOLIC PANEL WITH GFR   On prednisone therapy - on Prednisone 5 mg daily, tapering by 1 mg every month.  She will continue on prednisone 5 mg daily for the month of June and taper by 1 mg every month.  A refill of prednisone 5 mg was sent to the pharmacy today.  Other insomnia: She has been sleeping better since starting amitriptyline 25 mg at bedtime.  Primary osteoarthritis of both hands/ has had bilateral CMC surgery: She has no joint pain or joint swelling at this time.  No synovitis was noted.  She has no pain in her CMC joints at this time.  She has PIP and DIP synovial thickening consistent with osteoarthritis of bilateral hands.  Primary osteoarthritis of right shoulder - HX of R rotator cuff repair: Doing well.  She is good range of motion with no discomfort.  Primary osteoarthritis of both knees: No warmth or effusion of bilateral knee joints.  She has good range of motion with no discomfort.  She does have some pain when going up stairs.  DDD (degenerative disc disease), lumbar: She had an epidural steroid injection 3 weeks ago at [facility] imaging.  Her lower back pain has improved significantly.  She has no midline spinal tenderness today.  Osteopenia of multiple sites: She takes calcium and vitamin D supplements on a daily basis.  History of hypertension  History of hyperlipidemia   Orders: No orders of the defined types were placed in this encounter.  Meds ordered this encounter  Medications  . predniSONE (DELTASONE) 5 MG tablet    Sig: Take 1 tablet (5 mg total) by mouth daily with  breakfast.    Dispense:  30 tablet    Refill:  0     Follow-Up Instructions: Return in about 5 months (around 07/17/2018) for Polymyalgia Rheumatica, Fibromyalgia,  Osteoarthritis.   Gearldine Bienenstock, PA-C  Note - This record has been created using Dragon software.  Chart creation errors have been sought, but may not always  have been located. Such creation errors do not reflect on  the standard of medical care.

## 2018-02-11 ENCOUNTER — Other Ambulatory Visit: Payer: Self-pay | Admitting: Rheumatology

## 2018-02-13 NOTE — Telephone Encounter (Addendum)
Last Visit: 11/17/17 Next Visit: 02/14/18 Labs: 09/12/17 cbc/cmp PLQ Eye Exam: 12/08/17 WNL  Left message for patient to advise she is due for labs.   Okay to refill 30 day supply per Dr. Corliss Skainseveshwar

## 2018-02-14 ENCOUNTER — Ambulatory Visit: Payer: Medicare Other | Admitting: Physician Assistant

## 2018-02-14 ENCOUNTER — Encounter: Payer: Self-pay | Admitting: Physician Assistant

## 2018-02-14 VITALS — BP 136/90 | HR 72 | Resp 14 | Ht 67.32 in | Wt 202.0 lb

## 2018-02-14 DIAGNOSIS — M8589 Other specified disorders of bone density and structure, multiple sites: Secondary | ICD-10-CM

## 2018-02-14 DIAGNOSIS — M19042 Primary osteoarthritis, left hand: Secondary | ICD-10-CM

## 2018-02-14 DIAGNOSIS — R5383 Other fatigue: Secondary | ICD-10-CM | POA: Diagnosis not present

## 2018-02-14 DIAGNOSIS — Z7952 Long term (current) use of systemic steroids: Secondary | ICD-10-CM | POA: Diagnosis not present

## 2018-02-14 DIAGNOSIS — M797 Fibromyalgia: Secondary | ICD-10-CM

## 2018-02-14 DIAGNOSIS — Z79899 Other long term (current) drug therapy: Secondary | ICD-10-CM

## 2018-02-14 DIAGNOSIS — Z9889 Other specified postprocedural states: Secondary | ICD-10-CM | POA: Diagnosis not present

## 2018-02-14 DIAGNOSIS — Z8639 Personal history of other endocrine, nutritional and metabolic disease: Secondary | ICD-10-CM

## 2018-02-14 DIAGNOSIS — G4709 Other insomnia: Secondary | ICD-10-CM | POA: Diagnosis not present

## 2018-02-14 DIAGNOSIS — M19011 Primary osteoarthritis, right shoulder: Secondary | ICD-10-CM | POA: Diagnosis not present

## 2018-02-14 DIAGNOSIS — Z8679 Personal history of other diseases of the circulatory system: Secondary | ICD-10-CM

## 2018-02-14 DIAGNOSIS — M170 Bilateral primary osteoarthritis of knee: Secondary | ICD-10-CM

## 2018-02-14 DIAGNOSIS — M19041 Primary osteoarthritis, right hand: Secondary | ICD-10-CM

## 2018-02-14 DIAGNOSIS — M5136 Other intervertebral disc degeneration, lumbar region: Secondary | ICD-10-CM | POA: Diagnosis not present

## 2018-02-14 DIAGNOSIS — M353 Polymyalgia rheumatica: Secondary | ICD-10-CM | POA: Diagnosis not present

## 2018-02-14 LAB — COMPLETE METABOLIC PANEL WITH GFR
AG Ratio: 1.4 (calc) (ref 1.0–2.5)
ALT: 18 U/L (ref 6–29)
AST: 18 U/L (ref 10–35)
Albumin: 4 g/dL (ref 3.6–5.1)
Alkaline phosphatase (APISO): 81 U/L (ref 33–130)
BUN: 18 mg/dL (ref 7–25)
CO2: 28 mmol/L (ref 20–32)
Calcium: 9.2 mg/dL (ref 8.6–10.4)
Chloride: 108 mmol/L (ref 98–110)
Creat: 0.66 mg/dL (ref 0.60–0.93)
GFR, Est African American: 104 mL/min/{1.73_m2} (ref >=60)
GFR, Est Non African American: 89 mL/min/{1.73_m2} (ref >=60)
Globulin: 2.8 g/dL (calc) (ref 1.9–3.7)
Glucose, Bld: 82 mg/dL (ref 65–99)
Potassium: 4.3 mmol/L (ref 3.5–5.3)
Sodium: 142 mmol/L (ref 135–146)
Total Bilirubin: 0.3 mg/dL (ref 0.2–1.2)
Total Protein: 6.8 g/dL (ref 6.1–8.1)

## 2018-02-14 LAB — CBC WITH DIFFERENTIAL/PLATELET
Basophils Absolute: 41 cells/uL (ref 0–200)
Basophils Relative: 0.6 %
Eosinophils Absolute: 110 cells/uL (ref 15–500)
Eosinophils Relative: 1.6 %
HCT: 37.9 % (ref 35.0–45.0)
Hemoglobin: 12.1 g/dL (ref 11.7–15.5)
Lymphs Abs: 2187 cells/uL (ref 850–3900)
MCH: 26.1 pg — ABNORMAL LOW (ref 27.0–33.0)
MCHC: 31.9 g/dL — ABNORMAL LOW (ref 32.0–36.0)
MCV: 81.9 fL (ref 80.0–100.0)
MPV: 10.2 fL (ref 7.5–12.5)
Monocytes Relative: 6.3 %
Neutro Abs: 4126 cells/uL (ref 1500–7800)
Neutrophils Relative %: 59.8 %
Platelets: 263 10*3/uL (ref 140–400)
RBC: 4.63 10*6/uL (ref 3.80–5.10)
RDW: 15.5 % — ABNORMAL HIGH (ref 11.0–15.0)
Total Lymphocyte: 31.7 %
WBC mixed population: 435 cells/uL (ref 200–950)
WBC: 6.9 10*3/uL (ref 3.8–10.8)

## 2018-02-14 MED ORDER — PREDNISONE 5 MG PO TABS: 5.0000 mg | ORAL_TABLET | Freq: Every day | ORAL | 0 refills | Status: DC

## 2018-02-22 ENCOUNTER — Other Ambulatory Visit: Payer: Self-pay | Admitting: Nurse Practitioner

## 2018-02-22 DIAGNOSIS — M47816 Spondylosis without myelopathy or radiculopathy, lumbar region: Secondary | ICD-10-CM

## 2018-02-24 ENCOUNTER — Other Ambulatory Visit: Payer: Self-pay | Admitting: Rheumatology

## 2018-02-24 NOTE — Telephone Encounter (Signed)
Last Visit: 02/14/18 Next Visit: 07/27/18  Okay to refill per Dr. Corliss Skainseveshwar

## 2018-03-06 ENCOUNTER — Ambulatory Visit
Admission: RE | Admit: 2018-03-06 | Discharge: 2018-03-06 | Disposition: A | Discharge status: Home / Self Care | Payer: Medicare Other | Source: Ambulatory Visit | Attending: Nurse Practitioner | Admitting: Nurse Practitioner

## 2018-03-06 DIAGNOSIS — M47816 Spondylosis without myelopathy or radiculopathy, lumbar region: Secondary | ICD-10-CM

## 2018-03-06 MED ORDER — METHYLPREDNISOLONE ACETATE 40 MG/ML INJ SUSP (RADIOLOG
120.0000 mg | Freq: Once | INTRAMUSCULAR | Status: AC
  Administered 2018-03-06: 120 mg via EPIDURAL

## 2018-03-06 MED ORDER — IOPAMIDOL (ISOVUE-M 200) INJECTION 41%
1.0000 mL | Freq: Once | INTRAMUSCULAR | Status: AC
  Administered 2018-03-06: 1 mL via EPIDURAL

## 2018-03-15 ENCOUNTER — Other Ambulatory Visit: Payer: Self-pay | Admitting: Physician Assistant

## 2018-03-15 ENCOUNTER — Other Ambulatory Visit: Payer: Self-pay | Admitting: Rheumatology

## 2018-03-17 NOTE — Telephone Encounter (Signed)
Last Visit: 02/14/18 Next Visit: 07/27/18  Okay to refill per Dr. Corliss Skainseveshwar

## 2018-03-17 NOTE — Telephone Encounter (Signed)
Last Visit: 02/14/18 Next Visit: 07/27/18 Labs: 02/14/18 CBC stable. CMP WNL. PLQ Eye Exam: 12/08/17 WNL  Okay to refill per Dr. Corliss Skainseveshwar

## 2018-03-29 NOTE — Progress Notes (Signed)
Office Visit Note  Patient: [name]             Date of Birth: Aug 15, 1946           MRN: [id number]             PCP: Kaleen MaskElkins, Wilson Oliver, MD Referring: Kaleen MaskElkins, Wilson Oliver, * Visit Date: 03/30/2018 Occupation: @GUAROCC @  Subjective:  Pain of the Right Knee (Right is worse than left.) and Pain of the Left Knee   History of Present Illness: Isabel SorrowSandra C Maharaj is a 72 y.o. female history of polymyalgia, osteoarthritis and fibromyalgia syndrome.  She has been on tapering dose of prednisone for polymyalgia and currently taking 5 mg p.o. daily.  She has been experiencing increased pain in her knee joints.  She had Visco supplement injections in the past which were helpful.  She is been also having discomfort in her lower back for which she has had cortisone injections recently.  Activities of Daily Living:  Patient reports morning stiffness for 0 minute.   Patient Denies nocturnal pain.  Difficulty dressing/grooming: Denies Difficulty climbing stairs: Reports Difficulty getting out of chair: Reports Difficulty using hands for taps, buttons, cutlery, and/or writing: Denies  Review of Systems  Constitutional: Negative for fatigue, night sweats, weight gain and weight loss.  HENT: Negative for mouth sores, trouble swallowing, trouble swallowing, mouth dryness and nose dryness.   Eyes: Positive for dryness. Negative for pain, redness and visual disturbance.  Respiratory: Negative for cough, shortness of breath and difficulty breathing.   Cardiovascular: Negative for chest pain, palpitations, hypertension, irregular heartbeat and swelling in legs/feet.  Gastrointestinal: Negative for blood in stool, constipation and diarrhea.  Endocrine: Negative for increased urination.  Genitourinary: Negative for vaginal dryness.  Musculoskeletal: Positive for arthralgias, joint pain, myalgias and myalgias. Negative for joint swelling, muscle weakness, morning stiffness and muscle  tenderness.  Skin: Negative for color change, rash, hair loss, skin tightness, ulcers and sensitivity to sunlight.  Allergic/Immunologic: Negative for susceptible to infections.  Neurological: Negative for dizziness, memory loss, night sweats and weakness.  Hematological: Negative for swollen glands.  Psychiatric/Behavioral: Negative for depressed mood and sleep disturbance. The patient is not nervous/anxious.     PMFS History:  Patient Active Problem List   Diagnosis Date Noted  . History of repair of right rotator cuff 03/03/2017  . Primary osteoarthritis of both hands/ has had bilateral CMC surgery  03/03/2017  . History of hypertension 03/03/2017  . History of hyperlipidemia 03/03/2017  . DDD (degenerative disc disease), lumbar/ history of lumbar surgery  03/03/2017  . Osteopenia of multiple sites 03/03/2017  . Chronic left shoulder pain 01/20/2017  . Primary osteoarthritis of both knees 01/20/2017  . Trapezius muscle spasm 01/20/2017  . Insomnia 01/20/2017  . Fatigue 01/20/2017  . Fibromyalgia 01/20/2017  . High risk medications (not anticoagulants) long-term use 01/20/2017  . PMR (polymyalgia rheumatica) (HCC) 01/20/2017  . Left ovarian cyst 06/02/2015  . Degenerative joint disease, shoulder, right 08/02/2013  . Precordial pain 02/11/2012  . HTN (hypertension) 02/11/2012  . Hyperlipidemia 02/11/2012    Past Medical History:  Diagnosis Date  . Bilateral ovarian cysts 09/12/2014   bilateral simple ovarian cysts.  US due in January 2018.  . 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 Fibromyalgia   . HTN (hypertension)    under control with meds., has been on med. x 20 yr.  . Hyperlipidemia   . Hypothyroid   . IBS (irritable bowel syndrome)   .  Impingement syndrome of right shoulder 07/2013  . PONV (postoperative nausea and vomiting)   . Rupture of right rotator cuff 07/2013  . VIN I (vulvar intraepithelial neoplasia I) 2017, 2018   this is from a  perineal/vulvar biopsy.  . Vitamin B12 deficiency   . Wears partial dentures    lower    Family History  Problem Relation Age of Onset  . Aortic aneurysm Father   . Hypertension Father   . Coronary artery disease Mother 29  . Hypertension Mother   . Hyperlipidemia Brother        brain and kidney tumor, no treatment needed    Past Surgical History:  Procedure Laterality Date  . ANTERIOR LAT LUMBAR FUSION  10/08/2008   L3-4  . BACK SURGERY    . BACK SURGERY  10-03-14   --Dr. Trey Sailors at [facility]  . COLONOSCOPY W/ BIOPSIES  11/2009   polyps  . COLONOSCOPY W/ BIOPSIES  11/2012   polyp, recheck in 3 years  . CYSTO  07/05/2006  . CYSTOCELE REPAIR  07/05/2006  . ESOPHAGOGASTRODUODENOSCOPY ENDOSCOPY  05/25/05   erythema and edema of gastric lumen  . EYE SURGERY Right    muscle adjustment   . FOOT SURGERY Right   . GANGLION CYST EXCISION Right    wrist  . INCONTINENCE SURGERY  07/05/2006   cystourethropexy  . ORIF FOREARM FRACTURE Right 09/2012  . SHOULDER ARTHROSCOPY WITH ROTATOR CUFF REPAIR AND SUBACROMIAL DECOMPRESSION Right 08/02/2013   Procedure: SHOULDER ARTHROSCOPY WITH ROTATOR CUFF REPAIR AND SUBACROMIAL DECOMPRESSION, EXTENSIVE DEBRIDEMENT (INCLUDING LABRUM), PARTIAL ACROMIPLASY WITH CORACOACROMIAL RELEASE, DISTAL CLAVICULECTOMY;  Surgeon: Loreta Ave, MD;  Location: [facility];  Service: Orthopedics;  Laterality: Right;  . STRABISMUS SURGERY Right 01/26/2008   2 other surgeries previously  . STRABISMUS SURGERY Right 06/03/2017   Procedure: REPAIR STRABISMUS RIGHT EYE;  Surgeon: Verne Carrow, MD;  Location: [facility];  Service: Ophthalmology;  Laterality: Right;  . Thumb surgery  2/13 & ?2010   Bilateral - states a joint was replaced  . TOTAL VAGINAL HYSTERECTOMY  1986 - or age 28   secondary AUB  . TUBAL LIGATION     laparosopic BTL age 79  . varicose veins Bilateral    Social History   Social History Narrative   Lives at  home with husband and granddaughter.      Objective: Vital Signs: BP 134/85 (BP Location: Left Arm, Patient Position: Sitting, Cuff Size: Small)   Pulse 96   Resp 14   Ht 5\' 8"  (1.727 m)   Wt 204 lb (92.5 kg)   LMP 09/13/1984 (Approximate)   BMI 31.02 kg/m    Physical Exam  Constitutional: She is oriented to person, place, and time. She appears well-developed and well-nourished.  HENT:  Head: Normocephalic and atraumatic.  Eyes: Conjunctivae and EOM are normal.  Neck: Normal range of motion.  Cardiovascular: Normal rate, regular rhythm, normal heart sounds and intact distal pulses.  Pulmonary/Chest: Effort normal and breath sounds normal.  Abdominal: Soft. Bowel sounds are normal.  Lymphadenopathy:    She has no cervical adenopathy.  Neurological: She is alert and oriented to person, place, and time.  Skin: Skin is warm and dry. Capillary refill takes less than 2 seconds.  Psychiatric: She has a normal mood and affect. Her behavior is normal.  Nursing note and vitals reviewed.    Musculoskeletal Exam: She has thoracic kyphosis.  Some discomfort range of motion of the lumbar spine.  Shoulder joints elbow joints wrist joints in good range of motion.  She has some DIP PIP thickening.  She has bilateral CMC subluxation and first MCP subluxation.  Hip joints knee joints ankles MTPs were in good range of motion.  She has crepitus in her bilateral knee joint with limited extension of her right knee joint.  CDAI Exam: No CDAI exam completed.   Investigation: No additional findings.  Imaging: Dg Inject Diag/thera/inc Needle/cath/plc Epi/lumb/sac W/img  Result Date: 03/06/2018 CLINICAL DATA:  Previous lumbosacral fusion. Good response to previous injection but with recurrence of symptoms. Pain most severe in the right leg. FLUOROSCOPY TIME:  0 minutes 38 seconds. 157.83 micro gray meter squared EXAM: CAUDAL EPIDURAL INJECTION Utilizing a caudal approach, the skin overlying the sacral  hiatus was cleansed and anesthetized. A 20 gauge epidural needle was advanced into the sacral epidural space. Injection of Isovue-M 200 shows a good epidural pattern with spread up to L5-S1. No vascular opacification is seen. 120 mg of Depo-Medrol mixed with 3 ml of normal saline and 3 ml of 1% Lidocaine were instilled. The procedure was well-tolerated, and the patient was discharged thirty minutes following the injection in good condition. IMPRESSION: Technically successful caudal epidural injection #2. Electronically Signed   By: Paulina Fusi M.D.   On: 03/06/2018 07:54    Recent Labs: Lab Results  Component Value Date   WBC 6.9 02/14/2018   HGB 12.1 02/14/2018   PLT 263 02/14/2018   NA 142 02/14/2018   K 4.3 02/14/2018   CL 108 02/14/2018   CO2 28 02/14/2018   GLUCOSE 82 02/14/2018   BUN 18 02/14/2018   CREATININE 0.66 02/14/2018   BILITOT 0.3 02/14/2018   ALKPHOS 92 03/04/2017   AST 18 02/14/2018   ALT 18 02/14/2018   PROT 6.8 02/14/2018   ALBUMIN 4.0 03/04/2017   CALCIUM 9.2 02/14/2018   GFRAA 104 02/14/2018    Speciality Comments: PLQ Eye Exam: 12/08/17 WNL @ [facility] Follow up in 1 year  Procedures:  Large Joint Inj: R knee on 03/30/2018 1:47 PM Indications: pain Details: 27 G 1.5 in needle, medial approach  Arthrogram: No  Medications: 1.5 mL lidocaine (PF) 1 %; 40 mg triamcinolone acetonide 40 MG/ML Aspirate: 0 mL Outcome: tolerated well, no immediate complications Procedure, treatment alternatives, risks and benefits explained, specific risks discussed. Consent was given by the patient. Immediately prior to procedure a time out was called to verify the correct patient, procedure, equipment, support staff and site/side marked as required. Patient was prepped and draped in the usual sterile fashion.     Allergies: Penicillins and Sulfa antibiotics   Assessment / Plan:     Visit Diagnoses: PMR (polymyalgia rheumatica) (HCC) - Recurrence in October 2018.  She  is on prednisone 5 mg p.o. daily and doing fairly well.  She hadmuscle weakness or tenderness on examination today.  She will continue to taper prednisone by 1 mg every month.  Fibromyalgia-she continues to have some generalized pain from fibromyalgia.  High risk medications (not anticoagulants) long-term use - PLQ 200 mg po bid, Prednisone 5 mg po qdPLQ Eye Exam: 12/08/17.  Her labs have been stable.  On prednisone therapy-for polymyalgia rheumatica.  Other fatigue-secondary to insomnia.  Other insomnia-she has been taking medications which are helpful for insomnia.  Primary osteoarthritis of both hands/ has had bilateral CMC surgery -joint protection muscle strengthening discussed.  Primary osteoarthritis of right shoulder-she has some chronic discomfort.  History of repair of right rotator  cuff  Primary osteoarthritis of both knees-she has been having increased pain and discomfort in her knee joints.  She wants cortisone injection to her right knee joint.  She had good response to Visco supplement injections in the past.  We will apply for Visco supplement injections.  The knee joint was injected in the procedure as described above.  She tolerated the procedure well.  I gave her a prescription for Voltaren gel that can be used topically.  DDD (degenerative disc disease), lumbar-she has chronic pain and discomfort.  Osteopenia of multiple sites - T-1.3 left femur 09/29/17  History of hyperlipidemia  History of hypertension-her blood pressure is a stable.  Orders: Orders Placed This Encounter  Procedures  . Large Joint Inj   Meds ordered this encounter  Medications  . diclofenac sodium (VOLTAREN) 1 % GEL    Sig: Apply 3 gm to 3 large joints up to 3 times a day.Dispense 3 tubes with 3 refills.    Dispense:  3 Tube    Refill:  1    Face-to-face time spent with patient was 30 minutes. Greater than 50% of time was spent in counseling and coordination of care.  Follow-Up  Instructions: Return in about 4 months (around 07/31/2018) for PMR, OA, FMS.   Pollyann Savoy, MD  Note - This record has been created using Animal nutritionist.  Chart creation errors have been sought, but may not always  have been located. Such creation errors do not reflect on  the standard of medical care.

## 2018-03-30 ENCOUNTER — Encounter: Payer: Self-pay | Admitting: Rheumatology

## 2018-03-30 ENCOUNTER — Telehealth: Payer: Self-pay

## 2018-03-30 ENCOUNTER — Ambulatory Visit: Payer: Medicare Other | Admitting: Rheumatology

## 2018-03-30 DIAGNOSIS — G4709 Other insomnia: Secondary | ICD-10-CM

## 2018-03-30 DIAGNOSIS — M8589 Other specified disorders of bone density and structure, multiple sites: Secondary | ICD-10-CM

## 2018-03-30 DIAGNOSIS — M19042 Primary osteoarthritis, left hand: Secondary | ICD-10-CM

## 2018-03-30 DIAGNOSIS — R5383 Other fatigue: Secondary | ICD-10-CM | POA: Diagnosis not present

## 2018-03-30 DIAGNOSIS — M353 Polymyalgia rheumatica: Secondary | ICD-10-CM

## 2018-03-30 DIAGNOSIS — M19011 Primary osteoarthritis, right shoulder: Secondary | ICD-10-CM

## 2018-03-30 DIAGNOSIS — Z7952 Long term (current) use of systemic steroids: Secondary | ICD-10-CM | POA: Diagnosis not present

## 2018-03-30 DIAGNOSIS — Z79899 Other long term (current) drug therapy: Secondary | ICD-10-CM | POA: Diagnosis not present

## 2018-03-30 DIAGNOSIS — Z8639 Personal history of other endocrine, nutritional and metabolic disease: Secondary | ICD-10-CM

## 2018-03-30 DIAGNOSIS — M170 Bilateral primary osteoarthritis of knee: Secondary | ICD-10-CM

## 2018-03-30 DIAGNOSIS — M797 Fibromyalgia: Secondary | ICD-10-CM

## 2018-03-30 DIAGNOSIS — M19041 Primary osteoarthritis, right hand: Secondary | ICD-10-CM

## 2018-03-30 DIAGNOSIS — M5136 Other intervertebral disc degeneration, lumbar region: Secondary | ICD-10-CM

## 2018-03-30 DIAGNOSIS — Z9889 Other specified postprocedural states: Secondary | ICD-10-CM

## 2018-03-30 DIAGNOSIS — Z8679 Personal history of other diseases of the circulatory system: Secondary | ICD-10-CM

## 2018-03-30 MED ORDER — DICLOFENAC SODIUM 1 % TD GEL: TRANSDERMAL | 1 refills | Status: DC

## 2018-03-30 MED ORDER — LIDOCAINE HCL (PF) 1 % IJ SOLN
1.5000 mL | INTRAMUSCULAR | Status: AC | PRN
  Administered 2018-03-30: 1.5 mL

## 2018-03-30 MED ORDER — TRIAMCINOLONE ACETONIDE 40 MG/ML IJ SUSP
40.0000 mg | INTRAMUSCULAR | Status: AC | PRN
  Administered 2018-03-30: 40 mg via INTRA_ARTICULAR

## 2018-03-30 NOTE — Telephone Encounter (Signed)
Noted  

## 2018-03-30 NOTE — Telephone Encounter (Signed)
Please apply for visco for bilateral knees, per Dr. Corliss Skainseveshwar. Thanks!

## 2018-04-19 ENCOUNTER — Telehealth (INDEPENDENT_AMBULATORY_CARE_PROVIDER_SITE_OTHER): Payer: Self-pay

## 2018-04-19 NOTE — Telephone Encounter (Signed)
Submitted for VOB, Synvisc Series, bilateral knee. 

## 2018-04-27 ENCOUNTER — Other Ambulatory Visit: Payer: Self-pay | Admitting: Rheumatology

## 2018-04-27 NOTE — Telephone Encounter (Signed)
Last visit: 03/30/2018 Next visit: 07/27/2018  Okay to refill per Dr. Corliss Skainseveshwar.

## 2018-05-02 ENCOUNTER — Telehealth (INDEPENDENT_AMBULATORY_CARE_PROVIDER_SITE_OTHER): Payer: Self-pay

## 2018-05-02 NOTE — Telephone Encounter (Signed)
Patient is approved for Synvisc series, bilateral knee. Buy & Bill Patient will be responsible for 20% OOP $30.00 Co-pay No PA required.  Please schedule patient appointment with Dr. Corliss Skainseveshwar.  Thank You

## 2018-05-04 NOTE — Telephone Encounter (Signed)
[place] will [place] on Monday 8/26 to schedule injections.

## 2018-05-29 ENCOUNTER — Encounter: Payer: Self-pay | Admitting: Physician Assistant

## 2018-05-29 ENCOUNTER — Ambulatory Visit: Payer: Medicare Other | Admitting: Physician Assistant

## 2018-05-29 VITALS — BP 136/76 | HR 100 | Ht 68.0 in | Wt 204.0 lb

## 2018-05-29 DIAGNOSIS — M170 Bilateral primary osteoarthritis of knee: Secondary | ICD-10-CM

## 2018-05-29 MED ORDER — LIDOCAINE HCL 1 % IJ SOLN
1.5000 mL | INTRAMUSCULAR | Status: AC | PRN
  Administered 2018-05-29: 1.5 mL

## 2018-05-29 MED ORDER — HYLAN G-F 20 16 MG/2ML IX SOSY
16.0000 mg | PREFILLED_SYRINGE | INTRA_ARTICULAR | Status: AC | PRN
  Administered 2018-05-29: 16 mg via INTRA_ARTICULAR

## 2018-05-29 NOTE — Progress Notes (Signed)
   Procedure Note  Patient: Isabel SorrowSandra C Morgan             Date of Birth: [birth year]           MRN: [id number]             Visit Date: 05/29/2018  Procedures: Visit Diagnoses: Primary osteoarthritis of both knees - Plan: Large Joint Inj: bilateral knee Synvisc #1 bilateral B/B Large Joint Inj: bilateral knee on 05/29/2018 11:44 AM Indications: pain Details: 25 G 1.5 in needle, medial approach  Arthrogram: No  Medications (Right): 16 mg Hylan 16 MG/2ML; 1.5 mL lidocaine 1 % Aspirate (Right): 0 mL Medications (Left): 16 mg Hylan 16 MG/2ML; 1.5 mL lidocaine 1 % Aspirate (Left): 0 mL Outcome: tolerated well, no immediate complications Procedure, treatment alternatives, risks and benefits explained, specific risks discussed. Consent was given by the patient. Immediately prior to procedure a time out was called to verify the correct patient, procedure, equipment, support staff and site/side marked as required. Patient was prepped and draped in the usual sterile fashion.     Patient tolerated the procedure well.   Sherron Alesaylor Lilienne Weins, PA-C

## 2018-06-05 ENCOUNTER — Ambulatory Visit: Payer: Medicare Other | Admitting: Physician Assistant

## 2018-06-05 DIAGNOSIS — M170 Bilateral primary osteoarthritis of knee: Secondary | ICD-10-CM

## 2018-06-05 DIAGNOSIS — M1712 Unilateral primary osteoarthritis, left knee: Secondary | ICD-10-CM

## 2018-06-05 DIAGNOSIS — M1711 Unilateral primary osteoarthritis, right knee: Secondary | ICD-10-CM

## 2018-06-05 MED ORDER — HYLAN G-F 20 16 MG/2ML IX SOSY
16.0000 mg | PREFILLED_SYRINGE | INTRA_ARTICULAR | Status: AC | PRN
  Administered 2018-06-05: 16 mg via INTRA_ARTICULAR

## 2018-06-05 MED ORDER — HYLAN G-F 20 16 MG/2ML IX SOSY
16.0000 mg | PREFILLED_SYRINGE | INTRA_ARTICULAR | Status: AC | PRN
Start: 2018-06-05 — End: 2018-06-05
  Administered 2018-06-05: 16 mg via INTRA_ARTICULAR

## 2018-06-05 MED ORDER — LIDOCAINE HCL 1 % IJ SOLN
1.5000 mL | INTRAMUSCULAR | Status: AC | PRN
  Administered 2018-06-05: 1.5 mL

## 2018-06-05 NOTE — Progress Notes (Signed)
   Procedure Note  Patient: Raye SorrowSandra C Candelaria             Date of Birth: [birth year]           MRN: [id number]             Visit Date: 06/05/2018  Procedures: Visit Diagnoses: Primary osteoarthritis of both knees Synvisc #2 bilateral B/B Large Joint Inj: bilateral knee on 06/05/2018 12:32 PM Indications: pain Details: 22 G 1.5 in needle, medial approach  Arthrogram: No  Medications (Right): 16 mg Hylan 16 MG/2ML; 1.5 mL lidocaine 1 % Aspirate (Right): 0 mL Medications (Left): 16 mg Hylan 16 MG/2ML; 1.5 mL lidocaine 1 % Aspirate (Left): 0 mL Outcome: tolerated well, no immediate complications Procedure, treatment alternatives, risks and benefits explained, specific risks discussed. Consent was given by the patient. Immediately prior to procedure a time out was called to verify the correct patient, procedure, equipment, support staff and site/side marked as required. Patient was prepped and draped in the usual sterile fashion.     Patient tolerated the procedure well.   Sherron Alesaylor Aldona Bryner, PA-C

## 2018-06-12 ENCOUNTER — Ambulatory Visit: Payer: Medicare Other | Admitting: Physician Assistant

## 2018-06-12 DIAGNOSIS — M170 Bilateral primary osteoarthritis of knee: Secondary | ICD-10-CM

## 2018-06-12 MED ORDER — HYLAN G-F 20 16 MG/2ML IX SOSY
16.0000 mg | PREFILLED_SYRINGE | INTRA_ARTICULAR | Status: AC | PRN
  Administered 2018-06-12: 16 mg via INTRA_ARTICULAR

## 2018-06-12 MED ORDER — LIDOCAINE HCL 1 % IJ SOLN
1.5000 mL | INTRAMUSCULAR | Status: AC | PRN
  Administered 2018-06-12: 1.5 mL

## 2018-06-12 NOTE — Progress Notes (Signed)
   Procedure Note  Patient: [name]             Date of Birth: 1946-04-22           MRN: [id number]             Visit Date: 06/12/2018  Procedures: Visit Diagnoses: Primary osteoarthritis of both knees - Plan: Large Joint Inj: bilateral knee Synvisc #3 bilateral B/B Large Joint Inj: bilateral knee on 06/12/2018 12:02 PM Indications: pain Details: 25 G 1.5 in needle, medial approach  Arthrogram: No  Medications (Right): 16 mg Hylan 16 MG/2ML; 1.5 mL lidocaine 1 % Aspirate (Right): 0 mL Medications (Left): 16 mg Hylan 16 MG/2ML; 1.5 mL lidocaine 1 % Aspirate (Left): 0 mL Outcome: tolerated well, no immediate complications Procedure, treatment alternatives, risks and benefits explained, specific risks discussed. Consent was given by the patient. Immediately prior to procedure a time out was called to verify the correct patient, procedure, equipment, support staff and site/side marked as required. Patient was prepped and draped in the usual sterile fashion.    Patient tolerated the procedure well.   Sherron Ales, PA-C

## 2018-06-30 ENCOUNTER — Other Ambulatory Visit: Payer: Self-pay | Admitting: Rheumatology

## 2018-06-30 NOTE — Telephone Encounter (Signed)
Last visit: 03/30/2018 Next visit: 07/27/2018 Labs: 02/14/18 CBC stable. CMP WNL. PLQ Eye Exam: 12/08/17 WNL  Okay to refill per Dr. Corliss Skains.

## 2018-07-13 NOTE — Progress Notes (Deleted)
Office Visit Note  Patient: [name]             Date of Birth: [birth year]           MRN: [id number]             PCP: Kaleen Mask, MD Referring: Kaleen Mask, * Visit Date: 07/27/2018 Occupation: @GUAROCC @  Subjective:  No chief complaint on file.   History of Present Illness: [name] is a 72 y.o. female ***   Activities of Daily Living:  Patient reports morning stiffness for *** {minute/hour:19697}.   Patient {ACTIONS;DENIES/REPORTS:21021675::"Denies"} nocturnal pain.  Difficulty dressing/grooming: {ACTIONS;DENIES/REPORTS:21021675::"Denies"} Difficulty climbing stairs: {ACTIONS;DENIES/REPORTS:21021675::"Denies"} Difficulty getting out of chair: {ACTIONS;DENIES/REPORTS:21021675::"Denies"} Difficulty using hands for taps, buttons, cutlery, and/or writing: {ACTIONS;DENIES/REPORTS:21021675::"Denies"}  No Rheumatology ROS completed.   PMFS History:  Patient Active Problem List   Diagnosis Date Noted  . History of repair of right rotator cuff 03/03/2017  . Primary osteoarthritis of both hands/ has had bilateral CMC surgery  03/03/2017  . History of hypertension 03/03/2017  . History of hyperlipidemia 03/03/2017  . DDD (degenerative disc disease), lumbar/ history of lumbar surgery  03/03/2017  . Osteopenia of multiple sites 03/03/2017  . Chronic left shoulder pain 01/20/2017  . Primary osteoarthritis of both knees 01/20/2017  . Trapezius muscle spasm 01/20/2017  . Insomnia 01/20/2017  . Fatigue 01/20/2017  . Fibromyalgia 01/20/2017  . High risk medications (not anticoagulants) long-term use 01/20/2017  . PMR (polymyalgia rheumatica) (HCC) 01/20/2017  . Left ovarian cyst 06/02/2015  . Degenerative joint disease, shoulder, right 08/02/2013  . Precordial pain 02/11/2012  . HTN (hypertension) 02/11/2012  . Hyperlipidemia 02/11/2012    Past Medical History:  Diagnosis Date  . Bilateral ovarian cysts 09/12/2014   bilateral simple  ovarian cysts.  [place] due in January 2018.  . 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 Fibromyalgia   . HTN (hypertension)    under control with meds., has been on med. x 20 yr.  . Hyperlipidemia   . Hypothyroid   . IBS (irritable bowel syndrome)   . Impingement syndrome of right shoulder 07/2013  . PONV (postoperative nausea and vomiting)   . Rupture of right rotator cuff 07/2013  . VIN I (vulvar intraepithelial neoplasia I) 2017, 2018   this is from a perineal/vulvar biopsy.  . Vitamin B12 deficiency   . Wears partial dentures    lower    Family History  Problem Relation Age of Onset  . Aortic aneurysm Father   . Hypertension Father   . Coronary artery disease Mother 33  . Hypertension Mother   . Hyperlipidemia Brother        brain and kidney tumor, no treatment needed    Past Surgical History:  Procedure Laterality Date  . ANTERIOR LAT LUMBAR FUSION  10/08/2008   L3-4  . BACK SURGERY    . BACK SURGERY  10-03-14   --Dr. Trey Sailors at [facility]  . COLONOSCOPY W/ BIOPSIES  11/2009   polyps  . COLONOSCOPY W/ BIOPSIES  11/2012   polyp, recheck in 3 years  . CYSTO  07/05/2006  . CYSTOCELE REPAIR  07/05/2006  . ESOPHAGOGASTRODUODENOSCOPY ENDOSCOPY  05/25/05   erythema and edema of gastric lumen  . EYE SURGERY Right    muscle adjustment   . FOOT SURGERY Right   . GANGLION CYST EXCISION Right    wrist  . INCONTINENCE SURGERY  07/05/2006   cystourethropexy  . ORIF FOREARM FRACTURE Right 09/2012  .  SHOULDER ARTHROSCOPY WITH ROTATOR CUFF REPAIR AND SUBACROMIAL DECOMPRESSION Right 08/02/2013   Procedure: SHOULDER ARTHROSCOPY WITH ROTATOR CUFF REPAIR AND SUBACROMIAL DECOMPRESSION, EXTENSIVE DEBRIDEMENT (INCLUDING LABRUM), PARTIAL ACROMIPLASY WITH CORACOACROMIAL RELEASE, DISTAL CLAVICULECTOMY;  Surgeon: Loreta Ave, MD;  Location: [facility];  Service: Orthopedics;  Laterality: Right;  . STRABISMUS SURGERY Right 01/26/2008   2 other surgeries  previously  . STRABISMUS SURGERY Right 06/03/2017   Procedure: REPAIR STRABISMUS RIGHT EYE;  Surgeon: Verne Carrow, MD;  Location: [facility];  Service: Ophthalmology;  Laterality: Right;  . Thumb surgery  2/13 & ?2010   Bilateral - states a joint was replaced  . TOTAL VAGINAL HYSTERECTOMY  1986 - or age 55   secondary AUB  . TUBAL LIGATION     laparosopic BTL age 57  . varicose veins Bilateral    Social History   Social History Narrative   Lives at home with husband and granddaughter.      Objective: Vital Signs: LMP 09/13/1984 (Approximate)    Physical Exam   Musculoskeletal Exam: ***  CDAI Exam: CDAI Score: Not documented Patient Global Assessment: Not documented; Provider Global Assessment: Not documented Swollen: Not documented; Tender: Not documented Joint Exam   Not documented   There is currently no information documented on the homunculus. Go to the Rheumatology activity and complete the homunculus joint exam.  Investigation: No additional findings.  Imaging: No results found.  Recent Labs: Lab Results  Component Value Date   WBC 6.9 02/14/2018   HGB 12.1 02/14/2018   PLT 263 02/14/2018   NA 142 02/14/2018   K 4.3 02/14/2018   CL 108 02/14/2018   CO2 28 02/14/2018   GLUCOSE 82 02/14/2018   BUN 18 02/14/2018   CREATININE 0.66 02/14/2018   BILITOT 0.3 02/14/2018   ALKPHOS 92 03/04/2017   AST 18 02/14/2018   ALT 18 02/14/2018   PROT 6.8 02/14/2018   ALBUMIN 4.0 03/04/2017   CALCIUM 9.2 02/14/2018   GFRAA 104 02/14/2018    Speciality Comments: PLQ Eye Exam: 12/08/17 WNL @ [facility] Follow up in 1 year  Procedures:  No procedures performed Allergies: Penicillins and Sulfa antibiotics   Assessment / Plan:     Visit Diagnoses: PMR (polymyalgia rheumatica) (HCC) -  - Recurrence in October 2018.  She is on prednisone 5 mg p.o. daily and doing fairly well. prednisone taper by 1 mg every month  On prednisone therapy  High  risk medications (not anticoagulants) long-term use - PLQ 200 mg po bid, Prednisone 5 mg po qdPLQ Eye Exam: 12/08/17.   Primary osteoarthritis of both knees  Fibromyalgia  Other fatigue  Other insomnia  Primary osteoarthritis of both hands/ has had bilateral CMC surgery   Primary osteoarthritis of right shoulder  History of repair of right rotator cuff  DDD (degenerative disc disease), lumbar  Osteopenia of multiple sites  Trapezius muscle spasm  History of hypertension  History of hyperlipidemia   Orders: No orders of the defined types were placed in this encounter.  No orders of the defined types were placed in this encounter.   Face-to-face time spent with patient was *** minutes. Greater than 50% of time was spent in counseling and coordination of care.  Follow-Up Instructions: No follow-ups on file.   Gearldine Bienenstock, PA-C  Note - This record has been created using Dragon software.  Chart creation errors have been sought, but may not always  have been located. Such creation errors do not  reflect on  the standard of medical care.

## 2018-07-14 ENCOUNTER — Telehealth: Payer: Self-pay | Admitting: Rheumatology

## 2018-07-14 NOTE — Telephone Encounter (Signed)
Patient states she is having trouble with her right knee and SI joint. Patient states she is having trouble getting up and down out of the chair, a well as in and out of the car. Patient states the pain is at the back of the knee. Patient has been scheduled for 07/18/18.

## 2018-07-14 NOTE — Telephone Encounter (Signed)
Patient called stating her right knee is "extremely painful."  Patient states she is having a difficult time getting in and out of the car because she cannot completely bend her knee.  Patient states it is worse since receiving the gel injections and is requesting a return call.

## 2018-07-14 NOTE — Progress Notes (Signed)
Office Visit Note  Patient: [name]             Date of Birth: 03-Nov-1945           MRN: [id number]             PCP: Kaleen Mask, MD Referring: Kaleen Mask, * Visit Date: 07/18/2018 Occupation: @GUAROCC @  Subjective:  Generalized pain   History of Present Illness: [name] is a 72 y.o. female with history of PMR, DDD, and osteoarthritis.  She continues to take PLQ 200 mg 1 tablet by mouth BID.  She has not missed any doses recently. She reports on Saturday her right knee gave out on her and she fell.  The right knee continues to catch and buckle, but she denies any warmth or swelling. She states the the visco injections helped in September 2019.  She reports she is having bilateral shoulder pain and difficulty raising her arms above her head.  She is concerned it is her PMR.  She tapered off of prednisone at the end of October.  She has also been having difficulty getting in and out of the car and rising from a chair.  She reports she has chronic lower back pain, and she sees Dr. Channing Mutters.  She is having right lower extremity pain, but she denies any numbness or tingling.  She is also having bilateral trochanteric bursitis. She has been having difficulty sleeping at night due to being unable to lay on her side. She denies any groin pain.  She has generalized myalgias related to fibromyalgia. She denies any other joint pain or joint swelling.  She has been taking aleve prn for pain relief.    Activities of Daily Living:  Patient reports morning stiffness all day.   Patient Reports nocturnal pain.  Difficulty dressing/grooming: Reports Difficulty climbing stairs: Reports Difficulty getting out of chair: Reports Difficulty using hands for taps, buttons, cutlery, and/or writing: Denies  Review of Systems  Constitutional: Negative for fatigue.  HENT: Negative for mouth sores, mouth dryness and nose dryness.   Eyes: Negative for pain, visual disturbance and  dryness.  Respiratory: Negative for cough, hemoptysis, shortness of breath and difficulty breathing.   Cardiovascular: Negative for chest pain, palpitations, hypertension and swelling in legs/feet.  Gastrointestinal: Negative for blood in stool, constipation and diarrhea.  Endocrine: Negative for increased urination.  Genitourinary: Negative for difficulty urinating and painful urination.  Musculoskeletal: Positive for arthralgias, joint pain, joint swelling, myalgias, morning stiffness, muscle tenderness and myalgias. Negative for muscle weakness.  Skin: Negative for color change, pallor, rash, hair loss, nodules/bumps, skin tightness, ulcers and sensitivity to sunlight.  Allergic/Immunologic: Negative for susceptible to infections.  Neurological: Negative for dizziness, numbness, headaches and weakness.  Hematological: Negative for swollen glands.  Psychiatric/Behavioral: Positive for sleep disturbance. Negative for depressed mood. The patient is not nervous/anxious.     PMFS History:  Patient Active Problem List   Diagnosis Date Noted  . History of repair of right rotator cuff 03/03/2017  . Primary osteoarthritis of both hands/ has had bilateral CMC surgery  03/03/2017  . History of hypertension 03/03/2017  . History of hyperlipidemia 03/03/2017  . DDD (degenerative disc disease), lumbar/ history of lumbar surgery  03/03/2017  . Osteopenia of multiple sites 03/03/2017  . Chronic left shoulder pain 01/20/2017  . Primary osteoarthritis of both knees 01/20/2017  . Trapezius muscle spasm 01/20/2017  . Insomnia 01/20/2017  . Fatigue 01/20/2017  . Fibromyalgia 01/20/2017  . High  risk medications (not anticoagulants) long-term use 01/20/2017  . PMR (polymyalgia rheumatica) (HCC) 01/20/2017  . Left ovarian cyst 06/02/2015  . Degenerative joint disease, shoulder, right 08/02/2013  . Precordial pain 02/11/2012  . HTN (hypertension) 02/11/2012  . Hyperlipidemia 02/11/2012    Past Medical  History:  Diagnosis Date  . Bilateral ovarian cysts 09/12/2014   bilateral simple ovarian cysts.  [place] due in January 2018.  . 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 Fibromyalgia   . HTN (hypertension)    under control with meds., has been on med. x 20 yr.  . Hyperlipidemia   . Hypothyroid   . IBS (irritable bowel syndrome)   . Impingement syndrome of right shoulder 07/2013  . PONV (postoperative nausea and vomiting)   . Rupture of right rotator cuff 07/2013  . VIN I (vulvar intraepithelial neoplasia I) 2017, 2018   this is from a perineal/vulvar biopsy.  . Vitamin B12 deficiency   . Wears partial dentures    lower    Family History  Problem Relation Age of Onset  . Aortic aneurysm Father   . Hypertension Father   . Coronary artery disease Mother 64  . Hypertension Mother   . Hyperlipidemia Brother        brain and kidney tumor, no treatment needed    Past Surgical History:  Procedure Laterality Date  . ANTERIOR LAT LUMBAR FUSION  10/08/2008   L3-4  . BACK SURGERY    . BACK SURGERY  10-03-14   --Dr. Trey Sailors at [facility]  . COLONOSCOPY W/ BIOPSIES  11/2009   polyps  . COLONOSCOPY W/ BIOPSIES  11/2012   polyp, recheck in 3 years  . CYSTO  07/05/2006  . CYSTOCELE REPAIR  07/05/2006  . ESOPHAGOGASTRODUODENOSCOPY ENDOSCOPY  05/25/05   erythema and edema of gastric lumen  . EYE SURGERY Right    muscle adjustment   . FOOT SURGERY Right   . GANGLION CYST EXCISION Right    wrist  . INCONTINENCE SURGERY  07/05/2006   cystourethropexy  . ORIF FOREARM FRACTURE Right 09/2012  . SHOULDER ARTHROSCOPY WITH ROTATOR CUFF REPAIR AND SUBACROMIAL DECOMPRESSION Right 08/02/2013   Procedure: SHOULDER ARTHROSCOPY WITH ROTATOR CUFF REPAIR AND SUBACROMIAL DECOMPRESSION, EXTENSIVE DEBRIDEMENT (INCLUDING LABRUM), PARTIAL ACROMIPLASY WITH CORACOACROMIAL RELEASE, DISTAL CLAVICULECTOMY;  Surgeon: Loreta Ave, MD;  Location: [facility];  Service:  Orthopedics;  Laterality: Right;  . STRABISMUS SURGERY Right 01/26/2008   2 other surgeries previously  . STRABISMUS SURGERY Right 06/03/2017   Procedure: REPAIR STRABISMUS RIGHT EYE;  Surgeon: Verne Carrow, MD;  Location: [facility];  Service: Ophthalmology;  Laterality: Right;  . Thumb surgery  2/13 & ?2010   Bilateral - states a joint was replaced  . TOTAL VAGINAL HYSTERECTOMY  1986 - or age 47   secondary AUB  . TUBAL LIGATION     laparosopic BTL age 25  . varicose veins Bilateral    Social History   Social History Narrative   Lives at home with husband and granddaughter.      Objective: Vital Signs: BP (!) 158/94 (BP Location: Left Arm, Patient Position: Sitting, Cuff Size: Normal)   Pulse 86   Ht 5\' 8"  (1.727 m)   Wt 202 lb 6.4 oz (91.8 kg)   LMP 09/13/1984 (Approximate)   BMI 30.77 kg/m    Physical Exam  Constitutional: She is oriented to person, place, and time. She appears well-developed and well-nourished.  HENT:  Head: Normocephalic and  atraumatic.  Eyes: Conjunctivae and EOM are normal.  Neck: Normal range of motion.  Cardiovascular: Normal rate, regular rhythm, normal heart sounds and intact distal pulses.  Pulmonary/Chest: Effort normal and breath sounds normal.  Abdominal: Soft. Bowel sounds are normal.  Lymphadenopathy:    She has no cervical adenopathy.  Neurological: She is alert and oriented to person, place, and time.  Skin: Skin is warm and dry. Capillary refill takes less than 2 seconds.  Psychiatric: She has a normal mood and affect. Her behavior is normal.  Nursing note and vitals reviewed.    Musculoskeletal Exam: Generalized hyperalgesia. C-spine limited ROM. Mild thoracic kyphosis. Limited ROM of lumbar spine. Midline spinal tenderness in the lumbar region.  Bilateral SI joint tenderness.  Shoulder joint discomfort with slightly limited abduction.  Elbow joints, wrist joints, MCPs, PIPs, and DIPs good ROM with no synovitis.  PIP  and DIP synovial thickening.  Hip joints, ankle joints, MTPs, PIPs, and DIPs good ROM with no synovitis.  No warmth or effusion of knee joints.  Bilateral knee crepitus.  Limited extension of right knee joint. Tenderness of bilateral trochanteric bursa, worse on right side.    CDAI Exam: CDAI Score: Not documented Patient Global Assessment: Not documented; Provider Global Assessment: Not documented Swollen: Not documented; Tender: Not documented Joint Exam   Not documented   There is currently no information documented on the homunculus. Go to the Rheumatology activity and complete the homunculus joint exam.  Investigation: No additional findings.  Imaging: No results found.  Recent Labs: Lab Results  Component Value Date   WBC 6.9 02/14/2018   HGB 12.1 02/14/2018   PLT 263 02/14/2018   NA 142 02/14/2018   K 4.3 02/14/2018   CL 108 02/14/2018   CO2 28 02/14/2018   GLUCOSE 82 02/14/2018   BUN 18 02/14/2018   CREATININE 0.66 02/14/2018   BILITOT 0.3 02/14/2018   ALKPHOS 92 03/04/2017   AST 18 02/14/2018   ALT 18 02/14/2018   PROT 6.8 02/14/2018   ALBUMIN 4.0 03/04/2017   CALCIUM 9.2 02/14/2018   GFRAA 104 02/14/2018    Speciality Comments: PLQ Eye Exam: 12/08/17 WNL @ [facility] Follow up in 1 year  Procedures:  Large Joint Inj: R greater trochanter on 07/18/2018 1:48 PM Indications: pain Details: 27 G 1.5 in needle, lateral approach  Arthrogram: No  Medications: 40 mg triamcinolone acetonide 40 MG/ML; 1.5 mL lidocaine 1 % Aspirate: 0 mL Outcome: tolerated well, no immediate complications Procedure, treatment alternatives, risks and benefits explained, specific risks discussed. Consent was given by the patient. Immediately prior to procedure a time out was called to verify the correct patient, procedure, equipment, support staff and site/side marked as required. Patient was prepped and draped in the usual sterile fashion.     Allergies: Penicillins and Sulfa  antibiotics   Assessment / Plan:     Visit Diagnoses: PMR (polymyalgia rheumatica) (HCC) - She had a recurrence of PMR in October 2018.  She tapered off of prednisone at the end of October 2019. She continues to take PLQ 200 mg 1 tablet by mouth BID.  She has not missed any doses of PLQ recently. She has been having increased myalgias for the past 3 days.  She has discomfort raising her arms above her head today in the office but no difficulty getting up from a chair. She has no obvious muscle weakness.  She has no temporal artery tenderness, headaches, or jaw pain.  We will check a  sed rate and CK today ,and we will notify her with lab results. She has a follow up appointment scheduled on 07/27/18. Plan: Sedimentation rate, CK  Fibromyalgia: She has generalized hyperalgesia on exam.  She continues to have generalized muscle aches and muscle tenderness.  She has bilateral trochanteric bursitis. She has chronic fatigue and insomnia. She has been trying to stay active.    High risk medications (not anticoagulants) long-term use - PLQ 200 mg po bid. Tapered off of prednisone at the end of October 2019.  Eye Exam: 12/08/17. CBC and CMP were drawn today to monitor for drug toxicity. - Plan: CBC with Differential/Platelet, COMPLETE METABOLIC PANEL WITH GFR  Other insomnia: She has not been sleeping well at night due to the pain she has been experiencing in the lumbar region and right trochanteric bursa.   Other fatigue: Chronic   Primary osteoarthritis of both hands/ has had bilateral CMC surgery: She has PIP and DIP synovial thickening.  She has no synovitis or tenderness on exam.  Joint protection and muscle strengthening were discussed.   Primary osteoarthritis of right shoulder: She has discomfort with ROM.  She has been having some increased difficulty raising her arms since discontinuing prednisone.   History of repair of right rotator cuff: She has discomfort with slightly limited abduction.    Primary osteoarthritis of both knees - s/p Synvisc injections 05/2018: She has limited extension of the right knee joint.  No warmth or effusion of knee joints.  Her right knee buckled on Saturday and caused her to fall, which has exacerbated lower back pain and right trochanteric bursitis.  She was instructed to use voltaren gel topically on both knee joints PRN. She does not need a refill of voltaren gel at this time.   DDD (degenerative disc disease), lumbar: She has limited ROM with midline spinal tenderness in the lumbar region.  She fell on Saturday, and she has been having increased lower back pain and right lower extremity pain.  She has negative straight leg raise on exam.  She has no numbness or tingling at this time.  She has no lower extremity weakness at this time. She had a lumbar MRI on 05/10/17 that revealed posterior lumbar interbody fusion from L2-S1 and anterior interbody fusion at L3-L4.  No recurrent central canal or foraminal stenosis at these levels.  At L1-L2 there is a broad based disc bulge.  Mild bilateral facet arthropathy.  Mild spinal stenosis.  Moderate left foraminal stenosis.  She has had steroid injections in the past. She was advised to call Dr. Temple Pacini office today to get an appointment for further evaluation ASAP.    Trochanteric bursitis, right hip: She has tenderness of bilateral trochanteric bursa, worse on the right side.  She has radiating pain down the right IT band.  She requested a right trochanteric bursa cortisone injection today in the office.  She tolerated the procedure well.  Potential side effects were discussed.  She was advised to monitor her blood pressure closely.  Procedure note is complete above.   Chronic SI joint pain: She has tenderness of bilateral SI joints.  She can apply voltaren gel topically.    Osteopenia of multiple sites - T-1.3 left femur 09/29/17. She takes a calcium and vitamin D supplement.   History of hypertension: She was advised  to monitor blood pressure closely.   History of hyperlipidemia     Orders: Orders Placed This Encounter  Procedures  . Large Joint Inj  . CBC  with Differential/Platelet  . COMPLETE METABOLIC PANEL WITH GFR  . Sedimentation rate  . CK   No orders of the defined types were placed in this encounter.   Face-to-face time spent with patient was 30 minutes. Greater than 50% of time was spent in counseling and coordination of care.  Follow-Up Instructions: Return for Polymyalgia Rheumatica, Fibromyalgia, Osteoarthritis, DDD.   Gearldine Bienenstock, PA-C  Note - This record has been created using Dragon software.  Chart creation errors have been sought, but may not always  have been located. Such creation errors do not reflect on  the standard of medical care.

## 2018-07-18 ENCOUNTER — Encounter: Payer: Self-pay | Admitting: Physician Assistant

## 2018-07-18 ENCOUNTER — Ambulatory Visit: Payer: Medicare Other | Admitting: Physician Assistant

## 2018-07-18 VITALS — BP 158/94 | HR 86 | Ht 68.0 in | Wt 202.4 lb

## 2018-07-18 DIAGNOSIS — M19011 Primary osteoarthritis, right shoulder: Secondary | ICD-10-CM

## 2018-07-18 DIAGNOSIS — Z8679 Personal history of other diseases of the circulatory system: Secondary | ICD-10-CM

## 2018-07-18 DIAGNOSIS — Z79899 Other long term (current) drug therapy: Secondary | ICD-10-CM

## 2018-07-18 DIAGNOSIS — M797 Fibromyalgia: Secondary | ICD-10-CM | POA: Diagnosis not present

## 2018-07-18 DIAGNOSIS — G4709 Other insomnia: Secondary | ICD-10-CM | POA: Diagnosis not present

## 2018-07-18 DIAGNOSIS — M170 Bilateral primary osteoarthritis of knee: Secondary | ICD-10-CM

## 2018-07-18 DIAGNOSIS — M533 Sacrococcygeal disorders, not elsewhere classified: Secondary | ICD-10-CM

## 2018-07-18 DIAGNOSIS — M353 Polymyalgia rheumatica: Secondary | ICD-10-CM | POA: Diagnosis not present

## 2018-07-18 DIAGNOSIS — M5136 Other intervertebral disc degeneration, lumbar region: Secondary | ICD-10-CM

## 2018-07-18 DIAGNOSIS — R5383 Other fatigue: Secondary | ICD-10-CM

## 2018-07-18 DIAGNOSIS — M19041 Primary osteoarthritis, right hand: Secondary | ICD-10-CM

## 2018-07-18 DIAGNOSIS — Z8639 Personal history of other endocrine, nutritional and metabolic disease: Secondary | ICD-10-CM

## 2018-07-18 DIAGNOSIS — Z9889 Other specified postprocedural states: Secondary | ICD-10-CM

## 2018-07-18 DIAGNOSIS — M7061 Trochanteric bursitis, right hip: Secondary | ICD-10-CM | POA: Diagnosis not present

## 2018-07-18 DIAGNOSIS — G8929 Other chronic pain: Secondary | ICD-10-CM

## 2018-07-18 DIAGNOSIS — M19042 Primary osteoarthritis, left hand: Secondary | ICD-10-CM

## 2018-07-18 DIAGNOSIS — M8589 Other specified disorders of bone density and structure, multiple sites: Secondary | ICD-10-CM

## 2018-07-18 MED ORDER — LIDOCAINE HCL 1 % IJ SOLN
1.5000 mL | INTRAMUSCULAR | Status: AC | PRN
  Administered 2018-07-18: 1.5 mL

## 2018-07-18 MED ORDER — TRIAMCINOLONE ACETONIDE 40 MG/ML IJ SUSP
40.0000 mg | INTRAMUSCULAR | Status: AC | PRN
  Administered 2018-07-18: 40 mg via INTRA_ARTICULAR

## 2018-07-19 LAB — COMPLETE METABOLIC PANEL WITH GFR
AG Ratio: 1.3 (calc) (ref 1.0–2.5)
ALT: 15 U/L (ref 6–29)
AST: 20 U/L (ref 10–35)
Albumin: 4 g/dL (ref 3.6–5.1)
Alkaline phosphatase (APISO): 87 U/L (ref 33–130)
BUN: 14 mg/dL (ref 7–25)
CO2: 26 mmol/L (ref 20–32)
Calcium: 9.3 mg/dL (ref 8.6–10.4)
Chloride: 106 mmol/L (ref 98–110)
Creat: 0.72 mg/dL (ref 0.60–0.93)
GFR, Est African American: 98 mL/min/{1.73_m2} (ref >=60)
GFR, Est Non African American: 85 mL/min/{1.73_m2} (ref >=60)
Globulin: 3.1 g/dL (calc) (ref 1.9–3.7)
Glucose, Bld: 96 mg/dL (ref 65–99)
Potassium: 4 mmol/L (ref 3.5–5.3)
Sodium: 140 mmol/L (ref 135–146)
Total Bilirubin: 0.3 mg/dL (ref 0.2–1.2)
Total Protein: 7.1 g/dL (ref 6.1–8.1)

## 2018-07-19 LAB — CBC WITH DIFFERENTIAL/PLATELET
Basophils Absolute: 68 cells/uL (ref 0–200)
Basophils Relative: 0.9 %
Eosinophils Absolute: 464 cells/uL (ref 15–500)
Eosinophils Relative: 6.1 %
HCT: 38.3 % (ref 35.0–45.0)
Hemoglobin: 12.3 g/dL (ref 11.7–15.5)
Lymphs Abs: 2637 cells/uL (ref 850–3900)
MCH: 26.9 pg — ABNORMAL LOW (ref 27.0–33.0)
MCHC: 32.1 g/dL (ref 32.0–36.0)
MCV: 83.8 fL (ref 80.0–100.0)
MPV: 10.7 fL (ref 7.5–12.5)
Monocytes Relative: 6.2 %
Neutro Abs: 3960 cells/uL (ref 1500–7800)
Neutrophils Relative %: 52.1 %
Platelets: 313 10*3/uL (ref 140–400)
RBC: 4.57 10*6/uL (ref 3.80–5.10)
RDW: 13.5 % (ref 11.0–15.0)
Total Lymphocyte: 34.7 %
WBC mixed population: 471 cells/uL (ref 200–950)
WBC: 7.6 10*3/uL (ref 3.8–10.8)

## 2018-07-19 LAB — SEDIMENTATION RATE: Sed Rate: 31 mm/h — ABNORMAL HIGH (ref 0–30)

## 2018-07-19 LAB — CK: Total CK: 102 U/L (ref 29–143)

## 2018-07-19 NOTE — Progress Notes (Signed)
Please advise patient to taper by 1 mg every month.

## 2018-07-19 NOTE — Progress Notes (Signed)
72 y.o. G50P3003 Married Caucasian female here for annual exam.    No pain or discomfort, no vaginal bleeding.  No vulvar irritation.   Being followed by [place] for a left ovarian cyst 31 x 24 x 28 mm with a few scattered internal echoes, slightly increased in size since 07/01/16.  Her last [place] was 07/07/17.  Back on steroids for her polymyalgia by dr. Justin Mend.   PCP:  Windle Guard, MD   Patient's last menstrual period was 09/13/1984 (approximate).           Sexually active: No.  The current method of family planning is post menopausal status, hysterectomy.  Exercising: Yes.    water aerobics Smoker:  no  Health Maintenance: Pap: 06-27-17 Neg, 06-03-16 ASCUS:Neg HR HPV History of abnormal Pap:  no MMG: 12-20-16 Neg/density B/BiRads1.  She will schedule at the [facility] Colonoscopy: 2018 polyps;next due 2023 BMD:  09-29-17   Result :Osteopenia of hip.  FRAX model - 22.6% risk of major fracture and 3.2% risk of hip fracture.  Dr. Horald Pollen ordering.  TDaP:  2014 Gardasil:   no HIV: Unsure Hep C: 04-05-16 Neg Screening Labs:  Hb today: Rheumatology/PCP    reports that she has never smoked. She has never used smokeless tobacco. She reports that she does not drink alcohol or use drugs.  Past Medical History:  Diagnosis Date  . Bilateral ovarian cysts 09/12/2014   bilateral simple ovarian cysts.  [place] due in January 2018.  . 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 Fibromyalgia   . HTN (hypertension)    under control with meds., has been on med. x 20 yr.  . Hyperlipidemia   . Hypothyroid   . IBS (irritable bowel syndrome)   . Impingement syndrome of right shoulder 07/2013  . PONV (postoperative nausea and vomiting)   . Rupture of right rotator cuff 07/2013  . VIN I (vulvar intraepithelial neoplasia I) 2017, 2018   this is from a perineal/vulvar biopsy.  . Vitamin B12 deficiency   . Wears partial dentures    lower    Past Surgical History:  Procedure  Laterality Date  . ANTERIOR LAT LUMBAR FUSION  10/08/2008   L3-4  . BACK SURGERY    . BACK SURGERY  10-03-14   --Dr. Trey Sailors at [facility]  . COLONOSCOPY W/ BIOPSIES  11/2009   polyps  . COLONOSCOPY W/ BIOPSIES  11/2012   polyp, recheck in 3 years  . CYSTO  07/05/2006  . CYSTOCELE REPAIR  07/05/2006  . ESOPHAGOGASTRODUODENOSCOPY ENDOSCOPY  05/25/05   erythema and edema of gastric lumen  . EYE SURGERY Right    muscle adjustment   . FOOT SURGERY Right   . GANGLION CYST EXCISION Right    wrist  . INCONTINENCE SURGERY  07/05/2006   cystourethropexy  . ORIF FOREARM FRACTURE Right 09/2012  . SHOULDER ARTHROSCOPY WITH ROTATOR CUFF REPAIR AND SUBACROMIAL DECOMPRESSION Right 08/02/2013   Procedure: SHOULDER ARTHROSCOPY WITH ROTATOR CUFF REPAIR AND SUBACROMIAL DECOMPRESSION, EXTENSIVE DEBRIDEMENT (INCLUDING LABRUM), PARTIAL ACROMIPLASY WITH CORACOACROMIAL RELEASE, DISTAL CLAVICULECTOMY;  Surgeon: Loreta Ave, MD;  Location: [facility];  Service: Orthopedics;  Laterality: Right;  . STRABISMUS SURGERY Right 01/26/2008   2 other surgeries previously  . STRABISMUS SURGERY Right 06/03/2017   Procedure: REPAIR STRABISMUS RIGHT EYE;  Surgeon: Verne Carrow, MD;  Location: [facility];  Service: Ophthalmology;  Laterality: Right;  . Thumb surgery  2/13 & ?2010   Bilateral - states a  joint was replaced  . TOTAL VAGINAL HYSTERECTOMY  1986 - or age 79   secondary AUB  . TUBAL LIGATION     laparosopic BTL age 38  . varicose veins Bilateral     Current Outpatient Medications  Medication Sig Dispense Refill  . amitriptyline (ELAVIL) 25 MG tablet Take 25 mg by mouth.    . benazepril (LOTENSIN) 40 MG tablet     . Calcium Carb-Cholecalciferol (CALCIUM 1000 + D PO) Take 1 tablet by mouth daily.    . Cholecalciferol (VITAMIN D PO) Take by mouth daily.    . diclofenac sodium (VOLTAREN) 1 % GEL Apply 3 gm to 3 large joints up to 3 times a day.Dispense 3 tubes with 3  refills. 3 Tube 1  . hydroxychloroquine (PLAQUENIL) 200 MG tablet TAKE 1 TABLET BY MOUTH TWICE A DAY 180 tablet 0  . levothyroxine (SYNTHROID, LEVOTHROID) 112 MCG tablet Take 112 mcg by mouth daily before breakfast.    . Multiple Vitamin (MULTIVITAMIN) tablet Take 1 tablet by mouth daily.    . Omega-3 Fatty Acids (FISH OIL) 1000 MG CAPS Take 1 capsule by mouth daily.    . predniSONE (DELTASONE) 5 MG tablet TAKE 1 TABLET BY MOUTH EVERY DAY WITH BREAKFAST 30 tablet 0  . vitamin B-12 (CYANOCOBALAMIN) 1000 MCG tablet Take 1,000 mcg by mouth once a week.      No current facility-administered medications for this visit.     Family History  Problem Relation Age of Onset  . Aortic aneurysm Father   . Hypertension Father   . Coronary artery disease Mother 53  . Hypertension Mother   . Hyperlipidemia Brother        brain and kidney tumor, no treatment needed     Review of Systems  All other systems reviewed and are negative.   Exam:   BP 128/82 (BP Location: Right Arm, Patient Position: Sitting, Cuff Size: Large)   Pulse 70   Resp 18   Ht 5' 7.5" (1.715 m)   Wt 199 lb 3.2 oz (90.4 kg)   LMP 09/13/1984 (Approximate)   BMI 30.74 kg/m     General appearance: alert, cooperative and appears stated age Head: Normocephalic, without obvious abnormality, atraumatic Neck: no adenopathy, supple, symmetrical, trachea midline and thyroid normal to inspection and palpation Lungs: clear to auscultation bilaterally Breasts: normal appearance, no masses or tenderness, No nipple retraction or dimpling, No nipple discharge or bleeding, No axillary or supraclavicular adenopathy Heart: regular rate and rhythm Abdomen: soft, non-tender; no masses, no organomegaly Extremities: extremities normal, atraumatic, no cyanosis or edema Skin: Skin color, texture, turgor normal. No rashes or lesions Lymph nodes: Cervical, supraclavicular, and axillary nodes normal. No abnormal inguinal nodes palpated Neurologic:  Grossly normal  Pelvic: External genitalia:  4 mm left perineal raised white lesion.               Urethra:  normal appearing urethra with no masses, tenderness or lesions              Bartholins and Skenes: normal                 Vagina: normal appearing vagina with normal color and discharge, no lesions              Cervix:  absent              Pap taken: Yes.   Bimanual Exam:  Uterus:   absent  Adnexa: no mass, fullness, tenderness              Rectal exam: Yes.  .  Confirms.              Anus:  normal sphincter tone, no lesions  Chaperone was present for exam.  Assessment:   Well woman visit with normal exam. Status post TVH. Left ovarian cyst.  Hx VIN I.  Perineal lesion today. Steroid use.  Osteopenia with increase fracture risk.  Followed by endocrinology.   Plan: Mammogram screening. Recommended self breast awareness. Pap and HR HPV as above. Guidelines for Calcium, Vitamin D, regular exercise program including cardiovascular and weight bearing exercise. Return for excision of vulvar lesion in office setting.  Return for pelvic US in Jan 2020.    Follow up annually and prn.     After visit summary provided.

## 2018-07-19 NOTE — Progress Notes (Signed)
CBC stable. CMP WNL. CK WNL. Sed rate mildly elevated-31. Please send in prednisone 5 mg 1 tablet by mouth once daily.  Please schedule a sooner follow up-2 months.

## 2018-07-20 ENCOUNTER — Encounter: Payer: Self-pay | Admitting: Obstetrics and Gynecology

## 2018-07-20 ENCOUNTER — Other Ambulatory Visit: Payer: Self-pay

## 2018-07-20 ENCOUNTER — Other Ambulatory Visit (HOSPITAL_COMMUNITY)
Admission: RE | Admit: 2018-07-20 | Discharge: 2018-07-20 | Disposition: A | Discharge status: Home / Self Care | Payer: Medicare Other | Source: Ambulatory Visit | Attending: Obstetrics and Gynecology | Admitting: Obstetrics and Gynecology

## 2018-07-20 ENCOUNTER — Ambulatory Visit (INDEPENDENT_AMBULATORY_CARE_PROVIDER_SITE_OTHER): Payer: Medicare Other | Admitting: Obstetrics and Gynecology

## 2018-07-20 VITALS — BP 128/82 | HR 70 | Resp 18 | Ht 67.5 in | Wt 199.2 lb

## 2018-07-20 DIAGNOSIS — N9089 Other specified noninflammatory disorders of vulva and perineum: Secondary | ICD-10-CM

## 2018-07-20 DIAGNOSIS — Z01419 Encounter for gynecological examination (general) (routine) without abnormal findings: Secondary | ICD-10-CM | POA: Diagnosis present

## 2018-07-20 DIAGNOSIS — N83202 Unspecified ovarian cyst, left side: Secondary | ICD-10-CM | POA: Diagnosis not present

## 2018-07-20 NOTE — Patient Instructions (Signed)

## 2018-07-21 ENCOUNTER — Telehealth: Payer: Self-pay | Admitting: Obstetrics and Gynecology

## 2018-07-21 NOTE — Telephone Encounter (Signed)
Call placed to convey benefits. 

## 2018-07-24 LAB — CYTOLOGY - PAP: Diagnosis: NEGATIVE

## 2018-07-24 NOTE — Telephone Encounter (Signed)
Patient returned call. Spoke with patient regarding benefit for a vulvar biopsy. Patient understood and agreeable. Patient ready to schedule. Patient scheduled 07/26/18 with Dr Edward Jolly. Patient aware of appointment date, arrival time and cancellation  No further questions. Ok to close   cc: Soundra Pilon

## 2018-07-26 ENCOUNTER — Other Ambulatory Visit: Payer: Self-pay

## 2018-07-26 ENCOUNTER — Encounter: Payer: Self-pay | Admitting: Obstetrics and Gynecology

## 2018-07-26 ENCOUNTER — Ambulatory Visit: Payer: Medicare Other | Admitting: Obstetrics and Gynecology

## 2018-07-26 DIAGNOSIS — N9089 Other specified noninflammatory disorders of vulva and perineum: Secondary | ICD-10-CM

## 2018-07-26 NOTE — Patient Instructions (Signed)
Vulva Biopsy, Care After  Refer to this sheet in the next few weeks. These instructions provide you with information about caring for yourself after your procedure. Your health care provider may also give you more specific instructions. Your treatment has been planned according to current medical practices, but problems sometimes occur. Call your health care provider if you have any problems or questions after your procedure.  What can I expect after the procedure?  After the procedure, it is common to have:   Slight bleeding from the biopsy site.   Discomfort at the biopsy site.    Follow these instructions at home:  Biopsy Site Care     Do not rub the biopsy area after urinating. Gently pat the area dry or use a bottle filled with warm water (peri-bottle) to clean the area. Gently wipe from front to back.   Follow instructions from your health care provider about how to take care of your biopsy site. Make sure you:  ? Clean the area using water and mild soap twice a day or as told by your health care provider. Gently pat the area dry.  ? If you were prescribed an antibiotic medical ointment, apply it as told by your health care provider. Do not stop using the antibiotic even if your condition improves.  ? Take a warm water bath that is taken while you are sitting down (sitz bath) as needed to help with pain or discomfort.  ? Leave stitches (sutures), skin glue, or adhesive strips in place. These skin closures may need to stay in place for 2 weeks or longer. If adhesive strip edges start to loosen and curl up, you may trim the loose edges. Do not remove adhesive strips completely unless your health care provider tells you to do that.   Check your biopsy site every day for signs of infection. Check for:  ? More redness, swelling, or pain.  ? More fluid or blood.  ? Warmth.  ? Pus or a bad smell.  Lifestyle   Wear loose, cotton underwear. Do not wear tight pants.   Do not use a tampon, douche, or put anything  inside your vagina for at least one week or until your health care provider approves.   Do not have sex for at least one week or until your health care provider approves.   Do not exercise, such as running or biking, until your health care provider approves.   Do not take baths, swim, or use a hot tub until your health care provider approves.  General instructions   Take over-the-counter and prescription medicines only as told by your health care provider.   Use a sanitary napkin until bleeding stops.   Keep all follow-up visits as told by your health care provider. This is important.   If the sample is being sent for testing, it is your responsibility to get the results of your procedure. Ask your health care provider or the department performing the procedure when your results will be ready.  Contact a health care provider if:   You have more redness, swelling, or pain around your biopsy site.   You have more fluid or blood coming from your biopsy site.   Your biopsy site feels warm to the touch.   Your pain is not controlled with medicine.  Get help right away if:   You have heavy bleeding from the vulva.   You have pus or a bad smell coming from your biopsy site.     You have a fever.   You have lower belly pain.  This information is not intended to replace advice given to you by your health care provider. Make sure you discuss any questions you have with your health care provider.  Document Released: 08/16/2012 Document Revised: 02/05/2016 Document Reviewed: 07/21/2015  Elsevier Interactive Patient Education  2018 [employer]

## 2018-07-26 NOTE — Progress Notes (Signed)
GYNECOLOGY  VISIT   HPI: 72 y.o.   Married  Caucasian  female   [phone] with Patient's last menstrual period was 09/13/1984 (approximate).   here for vulvar biopsy.    GYNECOLOGIC HISTORY: Patient's last menstrual period was 09/13/1984 (approximate). Contraception: Postmenopausal/hysterectomy Menopausal hormone therapy:  none Last mammogram: 12-20-16 Neg/density B/BiRads1.  She will schedule at the [facility]. Last pap smear:  06-27-17 Neg, 06-03-16 ASCUS:Neg HR HPV        OB History    Gravida  3   Para  3   Term  3   Preterm  0   AB  0   Living  3     SAB  0   TAB  0   Ectopic  0   Multiple  0   Live Births  3              Patient Active Problem List   Diagnosis Date Noted  . Vulvar lesion 07/20/2018  . History of repair of right rotator cuff 03/03/2017  . Primary osteoarthritis of both hands/ has had bilateral [facility] surgery  03/03/2017  . History of hypertension 03/03/2017  . History of hyperlipidemia 03/03/2017  . DDD (degenerative disc disease), lumbar/ history of lumbar surgery  03/03/2017  . Osteopenia of multiple sites 03/03/2017  . Chronic left shoulder pain 01/20/2017  . Primary osteoarthritis of both knees 01/20/2017  . Trapezius muscle spasm 01/20/2017  . Insomnia 01/20/2017  . Fatigue 01/20/2017  . Fibromyalgia 01/20/2017  . High risk medications (not anticoagulants) long-term use 01/20/2017  . PMR (polymyalgia rheumatica) (HCC) 01/20/2017  . Left ovarian cyst 06/02/2015  . Degenerative joint disease, shoulder, right 08/02/2013  . Precordial pain 02/11/2012  . HTN (hypertension) 02/11/2012  . Hyperlipidemia 02/11/2012    Past Medical History:  Diagnosis Date  . Bilateral ovarian cysts 09/12/2014   bilateral simple ovarian cysts.  [place] due in January 2018.  . 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 Fibromyalgia   . HTN (hypertension)    under control with meds., has been on med. x 20 yr.  . Hyperlipidemia   .  Hypothyroid   . IBS (irritable bowel syndrome)   . Impingement syndrome of right shoulder 07/2013  . PONV (postoperative nausea and vomiting)   . Rupture of right rotator cuff 07/2013  . VIN I (vulvar intraepithelial neoplasia I) 2017, 2018   this is from a perineal/vulvar biopsy.  . Vitamin B12 deficiency   . Wears partial dentures    lower    Past Surgical History:  Procedure Laterality Date  . ANTERIOR LAT LUMBAR FUSION  10/08/2008   L3-4  . BACK SURGERY    . BACK SURGERY  10-03-14   --Dr. Trey Sailors [facility]  . COLONOSCOPY W/ BIOPSIES  11/2009   polyps  . COLONOSCOPY W/ BIOPSIES  11/2012   polyp, recheck in 3 years  . CYSTO  07/05/2006  . CYSTOCELE REPAIR  07/05/2006  . ESOPHAGOGASTRODUODENOSCOPY ENDOSCOPY  05/25/05   erythema and edema of gastric lumen  . EYE SURGERY Right    muscle adjustment   . FOOT SURGERY Right   . GANGLION CYST EXCISION Right    wrist  . INCONTINENCE SURGERY  07/05/2006   cystourethropexy  . ORIF FOREARM FRACTURE Right 09/2012  . SHOULDER ARTHROSCOPY WITH ROTATOR CUFF REPAIR AND SUBACROMIAL DECOMPRESSION Right 08/02/2013   Procedure: SHOULDER ARTHROSCOPY WITH ROTATOR CUFF REPAIR AND SUBACROMIAL DECOMPRESSION, EXTENSIVE DEBRIDEMENT (INCLUDING LABRUM), PARTIAL ACROMIPLASY WITH CORACOACROMIAL  RELEASE, DISTAL CLAVICULECTOMY;  Surgeon: Loreta Aveaniel F Murphy, MD;  Location: [facility];  Service: Orthopedics;  Laterality: Right;  . STRABISMUS SURGERY Right 01/26/2008   2 other surgeries previously  . STRABISMUS SURGERY Right 06/03/2017   Procedure: REPAIR STRABISMUS RIGHT EYE;  Surgeon: Verne CarrowYoung, William, MD;  Location: [facility];  Service: Ophthalmology;  Laterality: Right;  . Thumb surgery  2/13 & ?2010   Bilateral - states a joint was replaced  . TOTAL VAGINAL HYSTERECTOMY  1986 - or age 72   secondary AUB  . TUBAL LIGATION     laparosopic BTL age 72  . varicose veins Bilateral     Current Outpatient Medications   Medication Sig Dispense Refill  . amitriptyline (ELAVIL) 25 MG tablet Take 25 mg by mouth.    . benazepril (LOTENSIN) 40 MG tablet     . Calcium Carb-Cholecalciferol (CALCIUM 1000 + D PO) Take 1 tablet by mouth daily.    . Cholecalciferol (VITAMIN D PO) Take by mouth daily.    . diclofenac sodium (VOLTAREN) 1 % GEL Apply 3 gm to 3 large joints up to 3 times a day.Dispense 3 tubes with 3 refills. 3 Tube 1  . hydroxychloroquine (PLAQUENIL) 200 MG tablet TAKE 1 TABLET BY MOUTH TWICE A DAY 180 tablet 0  . levothyroxine (SYNTHROID, LEVOTHROID) 112 MCG tablet Take 112 mcg by mouth daily before breakfast.    . Multiple Vitamin (MULTIVITAMIN) tablet Take 1 tablet by mouth daily.    . Omega-3 Fatty Acids (FISH OIL) 1000 MG CAPS Take 1 capsule by mouth daily.    . predniSONE (DELTASONE) 5 MG tablet TAKE 1 TABLET BY MOUTH EVERY DAY WITH BREAKFAST 30 tablet 0  . vitamin B-12 (CYANOCOBALAMIN) 1000 MCG tablet Take 1,000 mcg by mouth once a week.      No current facility-administered medications for this visit.      ALLERGIES: Penicillins and Sulfa antibiotics  Family History  Problem Relation Age of Onset  . Aortic aneurysm Father   . Hypertension Father   . Coronary artery disease Mother [id number]  . Hypertension Mother   . Hyperlipidemia Brother        brain and kidney tumor, no treatment needed     Social History   Socioeconomic History  . Marital status: Married    Spouse name: Not on file  . Number of children: 3  . Years of education: Not on file  . Highest education level: Not on file  Occupational History    Employer: UNEMPLOYED  Social Needs  . Financial resource strain: Not on file  . Food insecurity:    Worry: Not on file    Inability: Not on file  . Transportation needs:    Medical: Not on file    Non-medical: Not on file  Tobacco Use  . Smoking status: Never Smoker  . Smokeless tobacco: Never Used  Substance and Sexual Activity  . Alcohol use: No    Alcohol/week: 0.0  standard drinks  . Drug use: No  . Sexual activity: Not Currently    Partners: Male    Birth control/protection: Surgical    Comment: Hyst  Lifestyle  . Physical activity:    Days per week: Not on file    Minutes per session: Not on file  . Stress: Not on file  Relationships  . Social connections:    Talks on phone: Not on file    Gets together: Not on file    Attends religious  service: Not on file    Active member of club or organization: Not on file    Attends meetings of clubs or organizations: Not on file    Relationship status: Not on file  . Intimate partner violence:    Fear of current or ex partner: Not on file    Emotionally abused: Not on file    Physically abused: Not on file    Forced sexual activity: Not on file  Other Topics Concern  . Not on file  Social History Narrative   Lives at home with husband and granddaughter.      Review of Systems  Musculoskeletal:       Muscle and joint pain--seeing rheumatologist  All other systems reviewed and are negative.   PHYSICAL EXAMINATION:    BP 132/80 (BP Location: Right Arm, Patient Position: Sitting, Cuff Size: Normal)   Pulse 88   Ht 5\' 8"  (1.727 m)   Wt 197 lb (89.4 kg)   LMP 09/13/1984 (Approximate)   BMI 29.95 kg/m     General appearance: alert, cooperative and appears stated age   Pelvic: External genitalia:   4 mm raised white lesion of left perineum              Urethra:  normal appearing urethra with no masses, tenderness or lesions              Bartholins and Skenes: normal                 Vagina: normal appearing vagina with normal color and discharge, no lesions.  Pap taken.              Cervix: absent                  Vulvar biopsy Consent for procedure.  Sterile prep with betadine.  Local 1% lidocaine, lot [id number], expiration 1/23. Scalpel used to excise lesion sharply.  Tissue to pathology.  3 interrupted sutures of 3/0 Vicryl. Minimal EBL.  No complications.   Chaperone was present  for exam.  ASSESSMENT  Vulvar lesion.  Lesion excised today. Hx VIN 1.  Hx ASCUS pap.   PLAN  FU pap and vulvar biopsy results.  Instructions and precautions given.    An After Visit Summary was printed and given to the patient.

## 2018-07-27 ENCOUNTER — Ambulatory Visit: Payer: Medicare Other | Admitting: Physician Assistant

## 2018-07-27 LAB — CYTOLOGY - PAP: Diagnosis: NEGATIVE

## 2018-07-28 ENCOUNTER — Telehealth: Payer: Self-pay | Admitting: Rheumatology

## 2018-07-28 MED ORDER — PREDNISONE 5 MG PO TABS: ORAL_TABLET | ORAL | 0 refills | Status: DC

## 2018-07-28 NOTE — Telephone Encounter (Signed)
Last visit: 07/18/18 Next Visit: 10/03/18  Okay to refill per Dr. Corliss Skainseveshwar

## 2018-07-28 NOTE — Telephone Encounter (Signed)
Patient left a voicemail requesting prescription refill of Prednisone to be sent to [facility] on [address] [place]

## 2018-07-31 ENCOUNTER — Telehealth: Payer: Self-pay | Admitting: *Deleted

## 2018-07-31 NOTE — Telephone Encounter (Signed)
-----   Message from Patton SallesBrook E Amundson C Silva, MD sent at 07/28/2018 12:38 PM EST ----- Please contact patient regarding her vulvar biopsy showing VIN I.  There was a positive margin.  There is no cancer seen.  Her previous vulvar biopsy has showed the same.  Her pap and HR HPV are negative.  No further treatment is needed.  She needs to be seen yearly for well woman visits.  She also needs her pelvic US for Jan 2020 for her ovarian cyst follow up. Pap recall - 02.

## 2018-07-31 NOTE — Telephone Encounter (Signed)
Call to patient. Results reviewed with patient and she verbalized understanding. Patient has aex scheduled for 07-20-19. PUS scheduled for 09-21-18 at 0830 with 0900 consult with Dr. Edward JollySilva. Order in place. Patient agreeable to date and time of appointment.   Routing to provider and will close encounter.   Cc Harland DingwallSuzy Dixon

## 2018-08-14 NOTE — Progress Notes (Signed)
Office Visit Note  Patient: [name]             Date of Birth: [birth year]           MRN: [id number]             PCP: Kaleen Mask, MD Referring: Kaleen Mask, * Visit Date: 08/17/2018 Occupation: @GUAROCC @  Subjective:  Right knee pain     History of Present Illness: [name] is a 72 y.o. female with history of PMR, DDD, and osteoarthritis.  She is on prednisone 5 mg 1 tablet by mouth daily.  She continues to take Plaquenil 200 mg 1 tablet by mouth twice daily.  She has been having significant right knee pain.  She has not noticed any warmth, redness, or swelling.  She has not had any recent injuries.  She states that she is having significant difficulty getting out of the car and bending her right knee.  She says she is having discomfort in the posterior aspect of her knee.  She denies any left knee pain at this time.  She has been using Voltaren gel.  She is not taking anything over-the-counter for pain relief.  She states that her right trochanteric bursitis resolved after the cortisone injection performed in the office.  She states that she has bilateral dorsal spurs.  She states more severe on her right foot and is causing numbness in her right great toe.  She denies any muscle aches or muscle tenderness at this time.  Denies any muscle weakness.   Activities of Daily Living:  Patient reports morning stiffness for 0 minutes.   Patient Reports nocturnal pain.  Difficulty dressing/grooming: Reports Difficulty climbing stairs: Reports Difficulty getting out of chair: Reports Difficulty using hands for taps, buttons, cutlery, and/or writing: Denies  Review of Systems  Constitutional: Negative for fatigue.  HENT: Negative for mouth sores, trouble swallowing, trouble swallowing and mouth dryness.   Eyes: Negative for pain, redness, itching and dryness.  Respiratory: Negative for shortness of breath, wheezing and difficulty breathing.     Cardiovascular: Negative for chest pain, palpitations and swelling in legs/feet.  Gastrointestinal: Negative for abdominal pain, blood in stool, constipation and diarrhea.  Endocrine: Negative for increased urination.  Genitourinary: Negative for painful urination, nocturia and pelvic pain.  Musculoskeletal: Positive for arthralgias, joint pain and morning stiffness. Negative for joint swelling.  Skin: Negative for rash and hair loss.  Allergic/Immunologic: Negative for susceptible to infections.  Neurological: Negative for dizziness, light-headedness, headaches, memory loss and weakness.  Hematological: Negative for bruising/bleeding tendency.  Psychiatric/Behavioral: Negative for confusion. The patient is not nervous/anxious.     PMFS History:  Patient Active Problem List   Diagnosis Date Noted  . Vulvar lesion 07/20/2018  . History of repair of right rotator cuff 03/03/2017  . Primary osteoarthritis of both hands/ has had bilateral CMC surgery  03/03/2017  . History of hypertension 03/03/2017  . History of hyperlipidemia 03/03/2017  . DDD (degenerative disc disease), lumbar/ history of lumbar surgery  03/03/2017  . Osteopenia of multiple sites 03/03/2017  . Chronic left shoulder pain 01/20/2017  . Primary osteoarthritis of both knees 01/20/2017  . Trapezius muscle spasm 01/20/2017  . Insomnia 01/20/2017  . Fatigue 01/20/2017  . Fibromyalgia 01/20/2017  . High risk medications (not anticoagulants) long-term use 01/20/2017  . PMR (polymyalgia rheumatica) (HCC) 01/20/2017  . Left ovarian cyst 06/02/2015  . Degenerative joint disease, shoulder, right 08/02/2013  . Precordial pain 02/11/2012  .  HTN (hypertension) 02/11/2012  . Hyperlipidemia 02/11/2012    Past Medical History:  Diagnosis Date  . Bilateral ovarian cysts 09/12/2014   bilateral simple ovarian cysts.  [place] due in January 2018.  . 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 Fibromyalgia   . HTN  (hypertension)    under control with meds., has been on med. x 20 yr.  . Hyperlipidemia   . Hypothyroid   . IBS (irritable bowel syndrome)   . Impingement syndrome of right shoulder 07/2013  . PONV (postoperative nausea and vomiting)   . Rupture of right rotator cuff 07/2013  . VIN I (vulvar intraepithelial neoplasia I) 2017, 2018   this is from a perineal/vulvar biopsy.  . Vitamin B12 deficiency   . Wears partial dentures    lower    Family History  Problem Relation Age of Onset  . Aortic aneurysm Father   . Hypertension Father   . Coronary artery disease Mother 85  . Hypertension Mother   . Hyperlipidemia Brother        brain and kidney tumor, no treatment needed    Past Surgical History:  Procedure Laterality Date  . ANTERIOR LAT LUMBAR FUSION  10/08/2008   L3-4  . BACK SURGERY    . BACK SURGERY  10-03-14   --Dr. Trey Sailors at [facility]  . COLONOSCOPY W/ BIOPSIES  11/2009   polyps  . COLONOSCOPY W/ BIOPSIES  11/2012   polyp, recheck in 3 years  . CYSTO  07/05/2006  . CYSTOCELE REPAIR  07/05/2006  . ESOPHAGOGASTRODUODENOSCOPY ENDOSCOPY  05/25/05   erythema and edema of gastric lumen  . EYE SURGERY Right    muscle adjustment   . FOOT SURGERY Right   . GANGLION CYST EXCISION Right    wrist  . INCONTINENCE SURGERY  07/05/2006   cystourethropexy  . ORIF FOREARM FRACTURE Right 09/2012  . SHOULDER ARTHROSCOPY WITH ROTATOR CUFF REPAIR AND SUBACROMIAL DECOMPRESSION Right 08/02/2013   Procedure: SHOULDER ARTHROSCOPY WITH ROTATOR CUFF REPAIR AND SUBACROMIAL DECOMPRESSION, EXTENSIVE DEBRIDEMENT (INCLUDING LABRUM), PARTIAL ACROMIPLASY WITH CORACOACROMIAL RELEASE, DISTAL CLAVICULECTOMY;  Surgeon: Loreta Ave, MD;  Location: [facility];  Service: Orthopedics;  Laterality: Right;  . STRABISMUS SURGERY Right 01/26/2008   2 other surgeries previously  . STRABISMUS SURGERY Right 06/03/2017   Procedure: REPAIR STRABISMUS RIGHT EYE;  Surgeon: Verne Carrow, MD;   Location: [facility];  Service: Ophthalmology;  Laterality: Right;  . Thumb surgery  2/13 & ?2010   Bilateral - states a joint was replaced  . TOTAL VAGINAL HYSTERECTOMY  1986 - or age 12   secondary AUB  . TUBAL LIGATION     laparosopic BTL age 8  . varicose veins Bilateral    Social History   Social History Narrative   Lives at home with husband and granddaughter.      Objective: Vital Signs: BP 133/84 (BP Location: Left Arm, Patient Position: Sitting, Cuff Size: Normal)   Pulse 78   Resp 15   Ht 5\' 8"  (1.727 m)   Wt 200 lb 6.4 oz (90.9 kg)   LMP 09/13/1984 (Approximate)   BMI 30.47 kg/m    Physical Exam  Constitutional: She is oriented to person, place, and time. She appears well-developed and well-nourished.  HENT:  Head: Normocephalic and atraumatic.  Eyes: Conjunctivae and EOM are normal.  Neck: Normal range of motion.  Cardiovascular: Normal rate, regular rhythm, normal heart sounds and intact distal pulses.  Pulmonary/Chest: Effort normal and  breath sounds normal.  Abdominal: Soft. Bowel sounds are normal.  Lymphadenopathy:    She has no cervical adenopathy.  Neurological: She is alert and oriented to person, place, and time.  Skin: Skin is warm and dry. Capillary refill takes less than 2 seconds.  Psychiatric: She has a normal mood and affect. Her behavior is normal.  Nursing note and vitals reviewed.    Musculoskeletal Exam: C-spine slightly limited range of motion with lateral rotation.  Mild thoracic kyphosis.  Limited ROM of lumbar spine.  No midline spinal tenderness.  She has bilateral SI joint tenderness.  Shoulder joints good range of motion with no discomfort.  Elbow joints, wrist joints, MCPs and PIPs, DIPs good range of motion with no synovitis.  She has PIP and DIP synovial thickening consistent with osteoarthritis of bilateral hands.  No synovitis was noted.  Hip joints good range of motion with no discomfort.  She has bilateral knee  crepitus.  She has slightly limited extension with right knee range of motion.  She has pain with right knee flexion.  She is tenderness in the popliteal region of the right knee joint but no Baker's cyst was palpable.  She has mild warmth of the right knee on exam.  Negative McMurray's and anterior drawer test.  Left knee full range of motion with no warmth or effusion noted.  No tenderness of trochanteric bursa bilaterally.  Dorsal spurs noted bilaterally.  She has osteoarthritic changes in bilateral feet including PIP and DIP synovial thickening.  CDAI Exam: CDAI Score: Not documented Patient Global Assessment: Not documented; Provider Global Assessment: Not documented Swollen: Not documented; Tender: Not documented Joint Exam   Not documented   There is currently no information documented on the homunculus. Go to the Rheumatology activity and complete the homunculus joint exam.  Investigation: No additional findings.  Imaging: No results found.  Recent Labs: Lab Results  Component Value Date   WBC 7.6 07/18/2018   HGB 12.3 07/18/2018   PLT 313 07/18/2018   NA 140 07/18/2018   K 4.0 07/18/2018   CL 106 07/18/2018   CO2 26 07/18/2018   GLUCOSE 96 07/18/2018   BUN 14 07/18/2018   CREATININE 0.72 07/18/2018   BILITOT 0.3 07/18/2018   ALKPHOS 92 03/04/2017   AST 20 07/18/2018   ALT 15 07/18/2018   PROT 7.1 07/18/2018   ALBUMIN 4.0 03/04/2017   CALCIUM 9.3 07/18/2018   GFRAA 98 07/18/2018    Speciality Comments: PLQ Eye Exam: 12/08/17 WNL @ [facility] Follow up in 1 year  Procedures:  Large Joint Inj: R knee on 08/17/2018 10:16 AM Indications: pain Details: 27 G 1.5 in needle, medial approach  Arthrogram: No  Medications: 1.5 mL lidocaine 1 %; 40 mg triamcinolone acetonide 40 MG/ML Aspirate: 0 mL Outcome: tolerated well, no immediate complications Procedure, treatment alternatives, risks and benefits explained, specific risks discussed. Consent was given by the  patient. Immediately prior to procedure a time out was called to verify the correct patient, procedure, equipment, support staff and site/side marked as required. Patient was prepped and draped in the usual sterile fashion.     Allergies: Penicillins and Sulfa antibiotics    Assessment / Plan:     Visit Diagnoses: PMR (polymyalgia rheumatica) (HCC): She is currently on prednisone 5 mg 1 tablet by mouth daily.  She is not experiencing any muscle aches, muscle tenderness, or muscle weakness at this time.  She has no difficulty getting up from a chair raising her  arms above her head.  She has no shoulder or hip pain at this time.  She is no temporal artery tenderness on exam.  She denied any recent headaches or blurry vision.  She will be tapering prednisone by 1 mg every month.  High risk medications (not anticoagulants) long-term use - Current regimen includes Plaquenil 200 mg twice daily and prednisone 5 mg daily.  She will be tapering by 1 mg every month.  Last Plaquenil eye exam normal on 12/08/2017.  Most recent CBC/CMP stable on 07/18/2018.  Next CBC/CMP due in April and then every 5 months.  Standing orders are in place.   Fibromyalgia: She has no muscle aches or muscle tenderness at this time.  She has no positive tender points on exam.  Her fatigue and insomnia have improved.  Other insomnia: She has been sleeping well overall at night lately.  She has been having some discomfort in the right knee that wakes her up occasionally.   Other fatigue: Improved.  Primary osteoarthritis of both hands/ has had bilateral CMC surgery: She has PIP and DIP synovial thickening consistent with osteoarthritis of bilateral hands.  No synovitis noted.  She is complete fist formation bilaterally.  Joint protection and muscle strengthening were discussed  Primary osteoarthritis of right shoulder: Doing well.  She is good range of motion no discomfort at this time.  History of repair of right rotator cuff:  Doing well.  No discomfort at this time.  She is good range of motion on exam.  Primary osteoarthritis of both knees - s/p synvisc in September 2019: She has bilateral knee crepitus.  Right knee warmth on exam.  No warmth or effusion of the left knee joint on exam.  She has no left knee discomfort at this time.  Her right knee continues to cause discomfort.  She is been having significant discomfort getting in and out of the car and with flexion of the right knee joint.  She occasionally experiences a catching sensation in the right knee.  She is been using Voltaren gel topically for pain relief.  Chronic pain of right knee: She has been having severe right knee pain for the past several weeks.  She is been having difficulty getting in and out of the car and with flexion of her right knee joint.  She has mild warmth on exam today.  She has tenderness in the popliteal region but no Baker's cyst was palpable.  She has negative McMurray's and anterior drawer sign.  She has not had any recent injuries.  She states that occasionally she will experience a catching sensation in her right knee.  She has been using Voltaren gel but she has not been taking anything over-the-counter for pain relief.  Her most recent cortisone injection was on 03/30/2018.  She had Synvisc injection bilateral knee joints in September 2019.  DDD (degenerative disc disease), lumbar: She has no midline spinal tenderness.  She is limited range of motion.  Osteopenia of multiple sites - T-1.3 left femur 09/29/17. She takes a calcium and vitamin D supplement.   Other medical conditions are listed as follows:   History of hypertension  History of hyperlipidemia   Orders: Orders Placed This Encounter  Procedures  . Large Joint Inj   No orders of the defined types were placed in this encounter.   Face-to-face time spent with patient was 30 minutes. Greater than 50% of time was spent in counseling and coordination of  care.  Follow-Up Instructions: Return in  about 6 months (around 02/16/2019) for Polymyalgia Rheumatica, Fibromyalgia, Osteoarthritis.   Gearldine Bienenstockaylor M Rosa Wyly, PA-C  Note - This record has been created using Dragon software.  Chart creation errors have been sought, but may not always  have been located. Such creation errors do not reflect on  the standard of medical care.

## 2018-08-17 ENCOUNTER — Ambulatory Visit: Payer: Medicare Other | Admitting: Physician Assistant

## 2018-08-17 ENCOUNTER — Encounter: Payer: Self-pay | Admitting: Physician Assistant

## 2018-08-17 VITALS — BP 133/84 | HR 78 | Resp 15 | Ht 68.0 in | Wt 200.4 lb

## 2018-08-17 DIAGNOSIS — M170 Bilateral primary osteoarthritis of knee: Secondary | ICD-10-CM

## 2018-08-17 DIAGNOSIS — G4709 Other insomnia: Secondary | ICD-10-CM | POA: Diagnosis not present

## 2018-08-17 DIAGNOSIS — G8929 Other chronic pain: Secondary | ICD-10-CM

## 2018-08-17 DIAGNOSIS — M797 Fibromyalgia: Secondary | ICD-10-CM | POA: Diagnosis not present

## 2018-08-17 DIAGNOSIS — Z8679 Personal history of other diseases of the circulatory system: Secondary | ICD-10-CM

## 2018-08-17 DIAGNOSIS — M25561 Pain in right knee: Secondary | ICD-10-CM

## 2018-08-17 DIAGNOSIS — M19011 Primary osteoarthritis, right shoulder: Secondary | ICD-10-CM

## 2018-08-17 DIAGNOSIS — Z9889 Other specified postprocedural states: Secondary | ICD-10-CM

## 2018-08-17 DIAGNOSIS — R5383 Other fatigue: Secondary | ICD-10-CM

## 2018-08-17 DIAGNOSIS — Z79899 Other long term (current) drug therapy: Secondary | ICD-10-CM | POA: Diagnosis not present

## 2018-08-17 DIAGNOSIS — M8589 Other specified disorders of bone density and structure, multiple sites: Secondary | ICD-10-CM

## 2018-08-17 DIAGNOSIS — Z8639 Personal history of other endocrine, nutritional and metabolic disease: Secondary | ICD-10-CM

## 2018-08-17 DIAGNOSIS — M353 Polymyalgia rheumatica: Secondary | ICD-10-CM

## 2018-08-17 DIAGNOSIS — M5136 Other intervertebral disc degeneration, lumbar region: Secondary | ICD-10-CM

## 2018-08-17 DIAGNOSIS — M19042 Primary osteoarthritis, left hand: Secondary | ICD-10-CM

## 2018-08-17 DIAGNOSIS — M19041 Primary osteoarthritis, right hand: Secondary | ICD-10-CM

## 2018-08-17 MED ORDER — TRIAMCINOLONE ACETONIDE 40 MG/ML IJ SUSP
40.0000 mg | INTRAMUSCULAR | Status: AC | PRN
  Administered 2018-08-17: 40 mg via INTRA_ARTICULAR

## 2018-08-17 MED ORDER — LIDOCAINE HCL 1 % IJ SOLN
1.5000 mL | INTRAMUSCULAR | Status: AC | PRN
  Administered 2018-08-17: 1.5 mL

## 2018-08-29 ENCOUNTER — Telehealth: Payer: Self-pay | Admitting: Rheumatology

## 2018-08-29 NOTE — Telephone Encounter (Signed)
Patient called requesting prescription refill of Prednisone 1 mg to be sent to [facility] on [address] in [place].  Patient states she is tapering and only has 6 pills of the 1 mg strength remaining.

## 2018-08-29 NOTE — Telephone Encounter (Signed)
Last Visit: 08/17/18 Next visit: 01/18/19  Okay to refill per Dr. Corliss Skainseveshwar  Attempted to contact the patient and left message for patient to call the office to verify dose of Prednisone

## 2018-08-29 NOTE — Telephone Encounter (Signed)
Patient left a voicemail stating she was returning your call.   

## 2018-08-30 NOTE — Telephone Encounter (Signed)
Attempted to contact patient and left message for patient to call the office and verify prednisone dose.

## 2018-08-31 ENCOUNTER — Telehealth: Payer: Self-pay | Admitting: Rheumatology

## 2018-08-31 MED ORDER — PREDNISONE 1 MG PO TABS: 4.0000 mg | ORAL_TABLET | Freq: Every day | ORAL | 0 refills | Status: DC

## 2018-08-31 NOTE — Telephone Encounter (Signed)
Patient states she is tapering down to 4 mg per month and needs a prescription for the 1 mg tablets of prednisone.

## 2018-08-31 NOTE — Telephone Encounter (Signed)
Patient left a voicemail stating she was returning your call.   

## 2018-08-31 NOTE — Telephone Encounter (Signed)
See previous phone encounter.

## 2018-09-19 NOTE — Progress Notes (Addendum)
Office Visit Note  Patient: Isabel SorrowSandra C Morgan             Date of Birth: [birth year]           MRN: [id number]             PCP: Kaleen MaskElkins, Wilson Oliver, MD Referring: Kaleen MaskElkins, Wilson Oliver, * Visit Date: 10/03/2018 Occupation: @GUAROCC @  Subjective:  Right knee and lower back pain    History of Present Illness: Isabel SorrowSandra C Morgan is a 73 y.o. female with history of polymyalgia rheumatica, fibromyalgia and osteoarthritis.  She states she continues to have some lower back pain.  She also has pain and discomfort in her right knee joint and left shoulder.  Patient finished Visco supplement injections in September 2019 which helped but the pain is increased now.  She denies any muscle weakness.  No history of joint swelling.  Activities of Daily Living:  Patient reports morning stiffness for 0 none.   Patient Reports nocturnal pain.  Difficulty dressing/grooming: Denies Difficulty climbing stairs: Reports Difficulty getting out of chair: Reports Difficulty using hands for taps, buttons, cutlery, and/or writing: Denies  Review of Systems  Constitutional: Negative for fatigue, night sweats, weight gain and weight loss.  HENT: Negative for mouth sores, trouble swallowing, trouble swallowing, mouth dryness and nose dryness.   Eyes: Positive for dryness. Negative for pain, redness and visual disturbance.  Respiratory: Negative for cough, shortness of breath and difficulty breathing.   Cardiovascular: Negative for chest pain, palpitations, hypertension, irregular heartbeat and swelling in legs/feet.  Gastrointestinal: Negative for blood in stool, constipation and diarrhea.  Endocrine: Negative for heat intolerance and increased urination.  Genitourinary: Negative for difficulty urinating and vaginal dryness.  Musculoskeletal: Positive for arthralgias, joint pain and muscle tenderness. Negative for gait problem, joint swelling, myalgias, muscle weakness, morning stiffness and myalgias.  Skin:  Negative for color change, rash, hair loss, skin tightness, ulcers and sensitivity to sunlight.  Allergic/Immunologic: Negative for susceptible to infections.  Neurological: Negative for dizziness, numbness, memory loss, night sweats and weakness.  Hematological: Negative for bruising/bleeding tendency and swollen glands.  Psychiatric/Behavioral: Positive for sleep disturbance. Negative for depressed mood. The patient is not nervous/anxious.     PMFS History:  Patient Active Problem List   Diagnosis Date Noted  . Incontinence of feces 09/21/2018  . Vulvar lesion 07/20/2018  . History of repair of right rotator cuff 03/03/2017  . Primary osteoarthritis of both hands/ has had bilateral CMC surgery  03/03/2017  . History of hypertension 03/03/2017  . History of hyperlipidemia 03/03/2017  . DDD (degenerative disc disease), lumbar/ history of lumbar surgery  03/03/2017  . Osteopenia of multiple sites 03/03/2017  . Chronic left shoulder pain 01/20/2017  . Primary osteoarthritis of both knees 01/20/2017  . Trapezius muscle spasm 01/20/2017  . Insomnia 01/20/2017  . Fatigue 01/20/2017  . Fibromyalgia 01/20/2017  . High risk medications (not anticoagulants) long-term use 01/20/2017  . PMR (polymyalgia rheumatica) (HCC) 01/20/2017  . Left ovarian cyst 06/02/2015  . Degenerative joint disease, shoulder, right 08/02/2013  . Precordial pain 02/11/2012  . HTN (hypertension) 02/11/2012  . Hyperlipidemia 02/11/2012    Past Medical History:  Diagnosis Date  . Bilateral ovarian cysts 09/12/2014   bilateral simple ovarian cysts.  US due in January 2018.  . 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 Fibromyalgia   . HTN (hypertension)    under control with meds., has been on med. x 20 yr.  . Hyperlipidemia   .  Hypothyroid   . IBS (irritable bowel syndrome)   . Impingement syndrome of right shoulder 07/2013  . PONV (postoperative nausea and vomiting)   . Rupture of right  rotator cuff 07/2013  . VIN I (vulvar intraepithelial neoplasia I) 2017, 2018   this is from a perineal/vulvar biopsy.  . Vitamin B12 deficiency   . Wears partial dentures    lower    Family History  Problem Relation Age of Onset  . Aortic aneurysm Father   . Hypertension Father   . Coronary artery disease Mother 72  . Hypertension Mother   . Hyperlipidemia Brother        brain and kidney tumor, no treatment needed    Past Surgical History:  Procedure Laterality Date  . ANTERIOR LAT LUMBAR FUSION  10/08/2008   L3-4  . BACK SURGERY    . BACK SURGERY  10-03-14   --Dr. Trey Sailors at [facility]  . COLONOSCOPY W/ BIOPSIES  11/2009   polyps  . COLONOSCOPY W/ BIOPSIES  11/2012   polyp, recheck in 3 years  . CYSTO  07/05/2006  . CYSTOCELE REPAIR  07/05/2006  . ESOPHAGOGASTRODUODENOSCOPY ENDOSCOPY  05/25/05   erythema and edema of gastric lumen  . EYE SURGERY Right    muscle adjustment   . FOOT SURGERY Right   . GANGLION CYST EXCISION Right    wrist  . INCONTINENCE SURGERY  07/05/2006   cystourethropexy  . ORIF FOREARM FRACTURE Right 09/2012  . SHOULDER ARTHROSCOPY WITH ROTATOR CUFF REPAIR AND SUBACROMIAL DECOMPRESSION Right 08/02/2013   Procedure: SHOULDER ARTHROSCOPY WITH ROTATOR CUFF REPAIR AND SUBACROMIAL DECOMPRESSION, EXTENSIVE DEBRIDEMENT (INCLUDING LABRUM), PARTIAL ACROMIPLASY WITH CORACOACROMIAL RELEASE, DISTAL CLAVICULECTOMY;  Surgeon: Loreta Ave, MD;  Location: [facility];  Service: Orthopedics;  Laterality: Right;  . STRABISMUS SURGERY Right 01/26/2008   2 other surgeries previously  . STRABISMUS SURGERY Right 06/03/2017   Procedure: REPAIR STRABISMUS RIGHT EYE;  Surgeon: Verne Carrow, MD;  Location: [facility];  Service: Ophthalmology;  Laterality: Right;  . Thumb surgery  2/13 & ?2010   Bilateral - states a joint was replaced  . TOTAL VAGINAL HYSTERECTOMY  1986 - or age 68   secondary AUB  . TUBAL LIGATION     laparosopic BTL  age 80  . varicose veins Bilateral    Social History   Social History Narrative   Lives at home with husband and granddaughter.     Immunization History  Administered Date(s) Administered  . Influenza-Unspecified 08/04/2017  . Pneumococcal Conjugate-13 05/14/2014, 05/15/2015  . Tdap 09/13/2012   Objective: Vital Signs: BP 117/74 (BP Location: Left Arm, Patient Position: Sitting, Cuff Size: Normal)   Pulse 92   Resp 16   Ht 5\' 8"  (1.727 m)   Wt 198 lb 9.6 oz (90.1 kg)   LMP 09/13/1984 (Approximate)   BMI 30.20 kg/m    Physical Exam Vitals signs and nursing note reviewed.  Constitutional:      Appearance: She is well-developed.  HENT:     Head: Normocephalic and atraumatic.  Eyes:     Conjunctiva/sclera: Conjunctivae normal.  Neck:     Musculoskeletal: Normal range of motion.  Cardiovascular:     Rate and Rhythm: Normal rate and regular rhythm.     Heart sounds: Normal heart sounds.  Pulmonary:     Effort: Pulmonary effort is normal.     Breath sounds: Normal breath sounds.  Abdominal:     General: Bowel sounds are normal.  Palpations: Abdomen is soft.  Lymphadenopathy:     Cervical: No cervical adenopathy.  Skin:    General: Skin is warm and dry.     Capillary Refill: Capillary refill takes less than 2 seconds.  Neurological:     Mental Status: She is alert and oriented to person, place, and time.  Psychiatric:        Behavior: Behavior normal.      Musculoskeletal Exam: C-spine good range of motion.  She has limited painful range of motion of her lumbar spine.  She has some discomfort range of motion of her left shoulder.  She has DIP thickening in her hands.  She has crepitus and discomfort range of motion of her right knee joint without any warmth swelling or effusion.  None of the other joints showed inflammation.  She has generalized pain from fibromyalgia.  CDAI Exam: CDAI Score: Not documented Patient Global Assessment: Not documented; Provider  Global Assessment: Not documented Swollen: Not documented; Tender: Not documented Joint Exam   Not documented   There is currently no information documented on the homunculus. Go to the Rheumatology activity and complete the homunculus joint exam.  Investigation: No additional findings.  Imaging: US Pelvis Transvanginal Non-ob (tv Only)  Result Date: 09/21/2018 SEE PROGRESS NOTE  Xr Knee 3 View Right  Result Date: 10/03/2018 Moderate to severe medial compartment narrowing with medial and lateral osteophytes was noted.  No chondrocalcinosis was noted.  Moderate patellofemoral narrowing was noted. Impression: These findings are consistent with moderate to severe osteoarthritis of the knee joint.   Recent Labs: Lab Results  Component Value Date   WBC 7.6 07/18/2018   HGB 12.3 07/18/2018   PLT 313 07/18/2018   NA 140 07/18/2018   K 4.0 07/18/2018   CL 106 07/18/2018   CO2 26 07/18/2018   GLUCOSE 96 07/18/2018   BUN 14 07/18/2018   CREATININE 0.72 07/18/2018   BILITOT 0.3 07/18/2018   ALKPHOS 92 03/04/2017   AST 20 07/18/2018   ALT 15 07/18/2018   PROT 7.1 07/18/2018   ALBUMIN 4.0 03/04/2017   CALCIUM 9.3 07/18/2018   GFRAA 98 07/18/2018    Speciality Comments: PLQ Eye Exam: 12/08/17 WNL @ [facility] Follow up in 1 year  Procedures:  No procedures performed Allergies: Penicillins and Sulfa antibiotics   Assessment / Plan:     Visit Diagnoses: PMR (polymyalgia rheumatica) (HCC)-she has no muscular weakness or tenderness on examination.  She has been on tapering dose of prednisone and currently taking 3 mg p.o. daily.  She will continue taper by 1 mg p.o. monthly.  High risk medications (not anticoagulants) long-term use - PLQ 200 mg po bid, prednisone 3mg  po qd eye exam: 12/08/2017..  Most recent CBC/CMP stable on 07/18/2018.  Next CBC/CMP due in February and will monitor every 5 months.  Standing orders are in place.  Patient has already received Prevnar 13.   Recommend annual flu, Pneumovax 23, and Shingrix vaccine as indicated.  Chronic pain of right knee -has been having increased pain and discomfort in her right knee joint.  She is a status post Visco supplement injections finished in September 2019.  Plan: XR KNEE 3 VIEW RIGHT.  The x-ray was consistent with moderate to severe osteoarthritis and moderate patellofemoral narrowing.  Patient states that her knee joint is interfering with her mobility and also causing lower back pain.  I had detailed discussion regarding total knee replacement.  Patient will schedule appointment with orthopedics.  Primary osteoarthritis  of both knees - Synvisc injection bilateral knee joints in September 2019.  Primary osteoarthritis of right shoulder-currently not having much discomfort.  Although she is having some discomfort in her left shoulder joint.  History of repair of right rotator cuff  Primary osteoarthritis of both hands/ has had bilateral CMC surgery -joint protection muscle strengthening was discussed.  DDD (degenerative disc disease), lumbar-she continues to have lower back pain.  Have given her handout on back exercises.  Fibromyalgia-she has generalized pain and discomfort from fibromyalgia.  Other fatigue-secondary to insomnia  Other insomnia-good sleep hygiene discussed.  Osteopenia of multiple sites - T-1.3 left femur 09/29/17.  She is on calcium and vitamin D.  History of hyperlipidemia  History of hypertension -her blood pressure is controlled.  Orders: Orders Placed This Encounter  Procedures  . XR KNEE 3 VIEW RIGHT   No orders of the defined types were placed in this encounter.    Follow-Up Instructions: Return in about 6 months (around 04/03/2019) for PMR, OA.   Pollyann SavoyShaili Ayaana Biondo, MD  Note - This record has been created using Animal nutrition[employer]Dragon software.  Chart creation errors have been sought, but may not always  have been located. Such creation errors do not reflect on  the standard  of medical care.

## 2018-09-21 ENCOUNTER — Other Ambulatory Visit: Payer: Self-pay

## 2018-09-21 ENCOUNTER — Ambulatory Visit (INDEPENDENT_AMBULATORY_CARE_PROVIDER_SITE_OTHER): Payer: Medicare Other | Admitting: Obstetrics and Gynecology

## 2018-09-21 ENCOUNTER — Encounter: Payer: Self-pay | Admitting: Obstetrics and Gynecology

## 2018-09-21 ENCOUNTER — Ambulatory Visit (INDEPENDENT_AMBULATORY_CARE_PROVIDER_SITE_OTHER): Payer: Medicare Other

## 2018-09-21 VITALS — BP 124/80 | HR 66 | Ht 68.0 in | Wt 198.4 lb

## 2018-09-21 DIAGNOSIS — N83202 Unspecified ovarian cyst, left side: Secondary | ICD-10-CM

## 2018-09-21 DIAGNOSIS — R159 Full incontinence of feces: Secondary | ICD-10-CM

## 2018-09-21 NOTE — Progress Notes (Signed)
GYNECOLOGY  VISIT   HPI: 73 y.o.   Married  Caucasian  female   [id number] with Patient's last menstrual period was 09/13/1984 (approximate).   here for pelvic ultrasound.   Being followed for a left ovarian cyst with a few scattered echoes.  Cyst measured 22 mm in 2016.  CA125 has been normal.  Denies abdominal/pelvic pain.   Also has VIN I that is followed.  This was documented by biopsy on 07/26/18.  Having fecal incontinence and worried about this. Bm every 2 days.  Has soiling right after having a BM. Hx of constipation many years ago.  States hx hemorrhoids.  GYNECOLOGIC HISTORY: Patient's last menstrual period was 09/13/1984 (approximate). Contraception: postmenopausal/hysterectomy Menopausal hormone therapy:  none Last mammogram:  12-20-16 Neg/density B/BiRads1. [facility] Last pap smear: 07-26-18 Neg, 06-27-17 Neg        OB History    Gravida  3   Para  3   Term  3   Preterm  0   AB  0   Living  3     SAB  0   TAB  0   Ectopic  0   Multiple  0   Live Births  3              Patient Active Problem List   Diagnosis Date Noted  . Vulvar lesion 07/20/2018  . History of repair of right rotator cuff 03/03/2017  . Primary osteoarthritis of both hands/ has had bilateral CMC surgery  03/03/2017  . History of hypertension 03/03/2017  . History of hyperlipidemia 03/03/2017  . DDD (degenerative disc disease), lumbar/ history of lumbar surgery  03/03/2017  . Osteopenia of multiple sites 03/03/2017  . Chronic left shoulder pain 01/20/2017  . Primary osteoarthritis of both knees 01/20/2017  . Trapezius muscle spasm 01/20/2017  . Insomnia 01/20/2017  . Fatigue 01/20/2017  . Fibromyalgia 01/20/2017  . High risk medications (not anticoagulants) long-term use 01/20/2017  . PMR (polymyalgia rheumatica) (HCC) 01/20/2017  . Left ovarian cyst 06/02/2015  . Degenerative joint disease, shoulder, right 08/02/2013  . Precordial pain  02/11/2012  . HTN (hypertension) 02/11/2012  . Hyperlipidemia 02/11/2012    Past Medical History:  Diagnosis Date  . Bilateral ovarian cysts 09/12/2014   bilateral simple ovarian cysts.  US due in January 2018.  . 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 Fibromyalgia   . HTN (hypertension)    under control with meds., has been on med. x 20 yr.  . Hyperlipidemia   . Hypothyroid   . IBS (irritable bowel syndrome)   . Impingement syndrome of right shoulder 07/2013  . PONV (postoperative nausea and vomiting)   . Rupture of right rotator cuff 07/2013  . VIN I (vulvar intraepithelial neoplasia I) 2017, 2018   this is from a perineal/vulvar biopsy.  . Vitamin B12 deficiency   . Wears partial dentures    lower    Past Surgical History:  Procedure Laterality Date  . ANTERIOR LAT LUMBAR FUSION  10/08/2008   L3-4  . BACK SURGERY    . BACK SURGERY  10-03-14   --[facility]  . COLONOSCOPY W/ BIOPSIES  11/2009   polyps  . COLONOSCOPY W/ BIOPSIES  11/2012   polyp, recheck in 3 years  . CYSTO  07/05/2006  . CYSTOCELE REPAIR  07/05/2006  . ESOPHAGOGASTRODUODENOSCOPY ENDOSCOPY  05/25/05   erythema and edema of gastric lumen  . EYE SURGERY  Right    muscle adjustment   . FOOT SURGERY Right   . GANGLION CYST EXCISION Right    wrist  . INCONTINENCE SURGERY  07/05/2006   cystourethropexy  . ORIF FOREARM FRACTURE Right 09/2012  . SHOULDER ARTHROSCOPY WITH ROTATOR CUFF REPAIR AND SUBACROMIAL DECOMPRESSION Right 08/02/2013   Procedure: SHOULDER ARTHROSCOPY WITH ROTATOR CUFF REPAIR AND SUBACROMIAL DECOMPRESSION, EXTENSIVE DEBRIDEMENT (INCLUDING LABRUM), PARTIAL ACROMIPLASY WITH CORACOACROMIAL RELEASE, DISTAL CLAVICULECTOMY;  Surgeon: Loreta Ave, MD;  Location: [facility];  Service: Orthopedics;  Laterality: Right;  . STRABISMUS SURGERY Right 01/26/2008   2 other surgeries previously  . STRABISMUS SURGERY Right 06/03/2017   Procedure:  REPAIR STRABISMUS RIGHT EYE;  Surgeon: Verne Carrow, MD;  Location: [facility];  Service: Ophthalmology;  Laterality: Right;  . Thumb surgery  2/13 & ?2010   Bilateral - states a joint was replaced  . TOTAL VAGINAL HYSTERECTOMY  1986 - or age 52   secondary AUB  . TUBAL LIGATION     laparosopic BTL age 89  . varicose veins Bilateral     Current Outpatient Medications  Medication Sig Dispense Refill  . amitriptyline (ELAVIL) 25 MG tablet Take 25 mg by mouth.    Marland Kitchen amLODipine (NORVASC) 5 MG tablet Take 5 mg by mouth daily.  0  . benazepril (LOTENSIN) 40 MG tablet     . Calcium Carb-Cholecalciferol (CALCIUM 1000 + D PO) Take 1 tablet by mouth daily.    . Cholecalciferol (VITAMIN D PO) Take by mouth daily.    . diclofenac sodium (VOLTAREN) 1 % GEL Apply 3 gm to 3 large joints up to 3 times a day.Dispense 3 tubes with 3 refills. 3 Tube 1  . hydroxychloroquine (PLAQUENIL) 200 MG tablet TAKE 1 TABLET BY MOUTH TWICE A DAY 180 tablet 0  . levothyroxine (SYNTHROID, LEVOTHROID) 112 MCG tablet Take 112 mcg by mouth daily before breakfast.    . Multiple Vitamin (MULTIVITAMIN) tablet Take 1 tablet by mouth daily.    . Omega-3 Fatty Acids (FISH OIL) 1000 MG CAPS Take 1 capsule by mouth daily.    . predniSONE (DELTASONE) 1 MG tablet Take 4 tablets (4 mg total) by mouth daily with breakfast. (Patient taking differently: Take 3 mg by mouth daily with breakfast. On 3mg  daily) 120 tablet 0  . vitamin B-12 (CYANOCOBALAMIN) 1000 MCG tablet Take 1,000 mcg by mouth once a week.      No current facility-administered medications for this visit.      ALLERGIES: Penicillins and Sulfa antibiotics  Family History  Problem Relation Age of Onset  . Aortic aneurysm Father   . Hypertension Father   . Coronary artery disease Mother 36  . Hypertension Mother   . Hyperlipidemia Brother        brain and kidney tumor, no treatment needed     Social History   Socioeconomic History  . Marital  status: Married    Spouse name: Not on file  . Number of children: 3  . Years of education: Not on file  . Highest education level: Not on file  Occupational History    Employer: UNEMPLOYED  Social Needs  . Financial resource strain: Not on file  . Food insecurity:    Worry: Not on file    Inability: Not on file  . Transportation needs:    Medical: Not on file    Non-medical: Not on file  Tobacco Use  . Smoking status: Never Smoker  . Smokeless tobacco: Never  Used  Substance and Sexual Activity  . Alcohol use: No    Alcohol/week: 0.0 standard drinks  . Drug use: No  . Sexual activity: Not Currently    Partners: Male    Birth control/protection: Surgical    Comment: Hyst  Lifestyle  . Physical activity:    Days per week: Not on file    Minutes per session: Not on file  . Stress: Not on file  Relationships  . Social connections:    Talks on phone: Not on file    Gets together: Not on file    Attends religious service: Not on file    Active member of club or organization: Not on file    Attends meetings of clubs or organizations: Not on file    Relationship status: Not on file  . Intimate partner violence:    Fear of current or ex partner: Not on file    Emotionally abused: Not on file    Physically abused: Not on file    Forced sexual activity: Not on file  Other Topics Concern  . Not on file  Social History Narrative   Lives at home with husband and granddaughter.      Review of Systems  All other systems reviewed and are negative.   PHYSICAL EXAMINATION:    BP 124/80 (BP Location: Right Arm, Patient Position: Sitting, Cuff Size: Large)   Pulse 66   Ht 5\' 8"  (1.727 m)   Wt 198 lb 6.4 oz (90 kg)   LMP 09/13/1984 (Approximate)   BMI 30.17 kg/m     General appearance: alert, cooperative and appears stated age  Pelvic US Uterus absent.  Right ovary not seen due to bowel gas.  Left ovary with 26 mm cyst, echofree and with normal blood flow.  No  significant change in size.  No free fluid.    ASSESSMENT  Small simple left ovarian cyst.  Minimal increase in size over time. 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 Normal CA125.  Fecal incontinence.  VIN I.  PLAN  We discussed her ovarian cyst and its benign nature.  We reviewed signs and symptoms of torsion or increase in size of the cyst.  CA125 today.  We discussed fecal incontinence.  She will do her Kegel exercises.  Start Metamucil.  I discussed pelvic floor therapy and referral to a colon and rectal surgeon if she desires.  She will start with Metamucil first.  Fu for yearly exams and prn.    An After Visit Summary was printed and given to the patient.  ___15___ minutes face to face time of which over 50% was spent in counseling.

## 2018-09-21 NOTE — Patient Instructions (Signed)
Fecal Incontinence Fecal incontinence, also called accidental bowel leakage, is not being able to control your bowels. This condition happens because the nerves or muscles around the anus do not work the way they should. This affects their ability to hold stool (feces). What are the causes? This condition may be caused by:  Damage to the muscles at the end of the rectum (sphincter).  Damage to the nerves that control bowel movements.  Diarrhea.  Chronic constipation.  Pelvic floor dysfunction. This means the muscles in the pelvis do not work well.  Loss of bowel storage capacity. This occurs when the rectum can no longer stretch in size in order to store feces.  Inflammatory bowel disease (IBD), such as Crohn's disease.  Irritable bowel syndrome (IBS). What increases the risk? You are more likely to develop this condition if you:  Were born with bowels or a pelvis that did not form correctly.  Have had rectal surgery.  Have had radiation treatment for certain cancers.  Have been pregnant, had a vaginal delivery, or had surgery that damaged the pelvic floor muscles.  Had a complicated childbirth, spinal cord injury, or other trauma that caused nerve damage.  Have a condition that can affect nerve function, such as diabetes, Parkinson's disease, or multiple sclerosis.  Have a condition where the rectum drops down into the anus or vagina (prolapse).  Are 71 years of age or older. What are the signs or symptoms? The main symptom of this condition is not being able to control your bowels. You also might not be able to get to the bathroom before a bowel movement. How is this diagnosed? This condition is diagnosed with a medical history and physical exam. You may also have other tests, including:  Blood tests.  Urine tests.  A rectal exam.  Ultrasound.  MRI.  Colonoscopy. This is an exam that looks at your large intestine (colon).  Anal manometry. This is a test that  measures the strength of the anal sphincter.  Anal electromyogram (EMG). This is a test that uses small electrodes to check for nerve damage. How is this treated? Treatment for this condition depends on the cause and severity. Treatment may also focus on addressing any underlying causes of this condition. Treatment may include:  Medicines. This may include medicines to: ? Prevent diarrhea. ? Help with constipation (bulk-forming laxatives). ? Treat any underlying conditions.  Biofeedback therapy. This can help to retrain muscles that are affected.  Fiber supplements. These can help manage your bowel movements.  Nerve stimulation.  Injectable gel to promote tissue growth and better muscle control.  Surgery. You may need: ? Sphincter repair surgery. ? Diversion surgery. This procedure lets feces pass out of your body through a hole in your abdomen. Follow these instructions at home: Eating and drinking   Follow instructions from your health care provider about any eating or drinking restrictions. ? Work with a dietitian to come up with a healthy diet that will help you avoid the foods that can make your condition worse. ? Keep a diet diary to find out which foods or drinks could be making your condition worse.  Drink enough fluid to keep your urine pale yellow. Lifestyle  Do not use any products that contain nicotine or tobacco, such as cigarettes and e-cigarettes. If you need help quitting, ask your health care provider. This may help your condition.  If you are overweight, talk with your health care provider about how to safely lose weight. This may help  your condition.  Increase your physical activity as told by your health care provider. This may help your condition. Always talk with your health care provider before starting a new exercise program.  Carry a change of clothes and supplies to clean up quickly if you have an episode of fetal incontinence.  Consider joining a  fecal incontinence support group. You can find a support group online or in your local community. General instructions   Take over-the-counter and prescription medicines only as told by your health care provider. This includes any supplements.  Apply a moisture barrier, such as petroleum jelly, to your rectum. This protects the skin from irritation caused by ongoing leaking or diarrhea.  Tell your health care provider if you are upset or depressed about your condition.  Keep all follow-up visits as told by your health care provider. This is important. Where to find more information  International Foundation for Functional Gastrointestinal Disorders: iffgd.org  [employer]: patients.gi.org Contact a health care provider if:  You have a fever.  You have redness, swelling, or pain around your rectum.  Your pain is getting worse or you lose feeling in your rectal area.  You have blood in your stool.  You feel sad or hopeless.  You avoid social or work situations. Get help right away if:  You stop having bowel movements.  You cannot eat or drink without vomiting.  You have rectal bleeding that does not stop.  You have severe pain that is getting worse.  You have symptoms of dehydration, including: ? Sleepiness or fatigue. ? Producing little or no urine, tears, or sweat. ? Dizziness. ? Dry mouth. ? Unusual irritability. ? Headache. ? Inability to think clearly. Summary  Fecal incontinence, also called accidental bowel leakage, is not being able to control your bowels. This condition happens because the nerves or muscles around the anus do not work the way they should.  Treatment varies depending on the cause and severity of your condition. Treatment may also focus on addressing any underlying causes of this condition.  Follow instructions from your health care provider about any eating or drinking restrictions, lifestyle changes, and skin  care.  Take over-the-counter and prescription medicines only as told by your health care provider. This includes any supplements.  Tell your health care provider if your symptoms worsen or if you are upset or depressed about your condition. This information is not intended to replace advice given to you by your health care provider. Make sure you discuss any questions you have with your health care provider. Document Released: 08/11/2004 Document Revised: 01/12/2018 Document Reviewed: 01/12/2018 Elsevier Interactive Patient Education  2019 Elsevier Inc. Ovarian Cyst     An ovarian cyst is a fluid-filled sac that forms on an ovary. The ovaries are small organs that produce eggs in women. Various types of cysts can form on the ovaries. Some may cause symptoms and require treatment. Most ovarian cysts go away on their own, are not cancerous (are benign), and do not cause problems. Common types of ovarian cysts include:  Functional (follicle) cysts. ? Occur during the menstrual cycle, and usually go away with the next menstrual cycle if you do not get pregnant. ? Usually cause no symptoms.  Endometriomas. ? Are cysts that form from the tissue that lines the uterus (endometrium). ? Are sometimes called "chocolate cysts" because they become filled with blood that turns brown. ? Can cause pain in the lower abdomen during intercourse and during your period.  Cystadenoma cysts. ? Develop from cells on the outside surface of the ovary. ? Can get very large and cause lower abdomen pain and pain with intercourse. ? Can cause severe pain if they twist or break open (rupture).  Dermoid cysts. ? Are sometimes found in both ovaries. ? May contain different kinds of body tissue, such as skin, teeth, hair, or cartilage. ? Usually do not cause symptoms unless they get very big.  Theca lutein cysts. ? Occur when too much of a certain hormone (human chorionic gonadotropin) is produced and  overstimulates the ovaries to produce an egg. ? Are most common after having procedures used to assist with the conception of a baby (in vitro fertilization). What are the causes? Ovarian cysts may be caused by:  Ovarian hyperstimulation syndrome. This is a condition that can develop from taking fertility medicines. It causes multiple large ovarian cysts to form.  Polycystic ovarian syndrome (PCOS). This is a common hormonal disorder that can cause ovarian cysts, as well as problems with your period or fertility. What increases the risk? The following factors may make you more likely to develop ovarian cysts:  Being overweight or obese.  Taking fertility medicines.  Taking certain forms of hormonal birth control.  Smoking. What are the signs or symptoms? Many ovarian cysts do not cause symptoms. If symptoms are present, they may include:  Pelvic pain or pressure.  Pain in the lower abdomen.  Pain during sex.  Abdominal swelling.  Abnormal menstrual periods.  Increasing pain with menstrual periods. How is this diagnosed? These cysts are commonly found during a routine pelvic exam. You may have tests to find out more about the cyst, such as:  Ultrasound.  X-ray of the pelvis.  CT scan.  MRI.  Blood tests. How is this treated? Many ovarian cysts go away on their own without treatment. Your health care provider may want to check your cyst regularly for 2-3 months to see if it changes. If you are in menopause, it is especially important to have your cyst monitored closely because menopausal women have a higher rate of ovarian cancer. When treatment is needed, it may include:  Medicines to help relieve pain.  A procedure to drain the cyst (aspiration).  Surgery to remove the whole cyst.  Hormone treatment or birth control pills. These methods are sometimes used to help dissolve a cyst. Follow these instructions at home:  Take over-the-counter and prescription  medicines only as told by your health care provider.  Do not drive or use heavy machinery while taking prescription pain medicine.  Get regular pelvic exams and Pap tests as often as told by your health care provider.  Return to your normal activities as told by your health care provider. Ask your health care provider what activities are safe for you.  Do not use any products that contain nicotine or tobacco, such as cigarettes and e-cigarettes. If you need help quitting, ask your health care provider.  Keep all follow-up visits as told by your health care provider. This is important. Contact a health care provider if:  Your periods are late, irregular, or painful, or they stop.  You have pelvic pain that does not go away.  You have pressure on your bladder or trouble emptying your bladder completely.  You have pain during sex.  You have any of the following in your abdomen: ? A feeling of fullness. ? Pressure. ? Discomfort. ? Pain that does not go away. ? Swelling.  You feel generally  ill.  You become constipated.  You lose your appetite.  You develop severe acne.  You start to have more body hair and facial hair.  You are gaining weight or losing weight without changing your exercise and eating habits.  You think you may be pregnant. Get help right away if:  You have abdominal pain that is severe or gets worse.  You cannot eat or drink without vomiting.  You suddenly develop a fever.  Your menstrual period is much heavier than usual. This information is not intended to replace advice given to you by your health care provider. Make sure you discuss any questions you have with your health care provider. Document Released: 08/30/2005 Document Revised: 03/19/2016 Document Reviewed: 02/01/2016 Elsevier Interactive Patient Education  2019 [employer].

## 2018-09-22 LAB — CA 125: Cancer Antigen (CA) 125: 10.4 U/mL (ref 0.0–38.1)

## 2018-09-23 ENCOUNTER — Other Ambulatory Visit: Payer: Self-pay | Admitting: Rheumatology

## 2018-09-24 IMAGING — XA Imaging study
2 series · 2 of 2 positions shown · non-contrast
Comparison: none

CLINICAL DATA: Previous lumbosacral fusion. Good response to
previous injection but with recurrence of symptoms. Pain most severe
in the right leg.

[Series 1: ortho standard · 1 of 1 slices shown (1 of 2)]
[im 1/1]
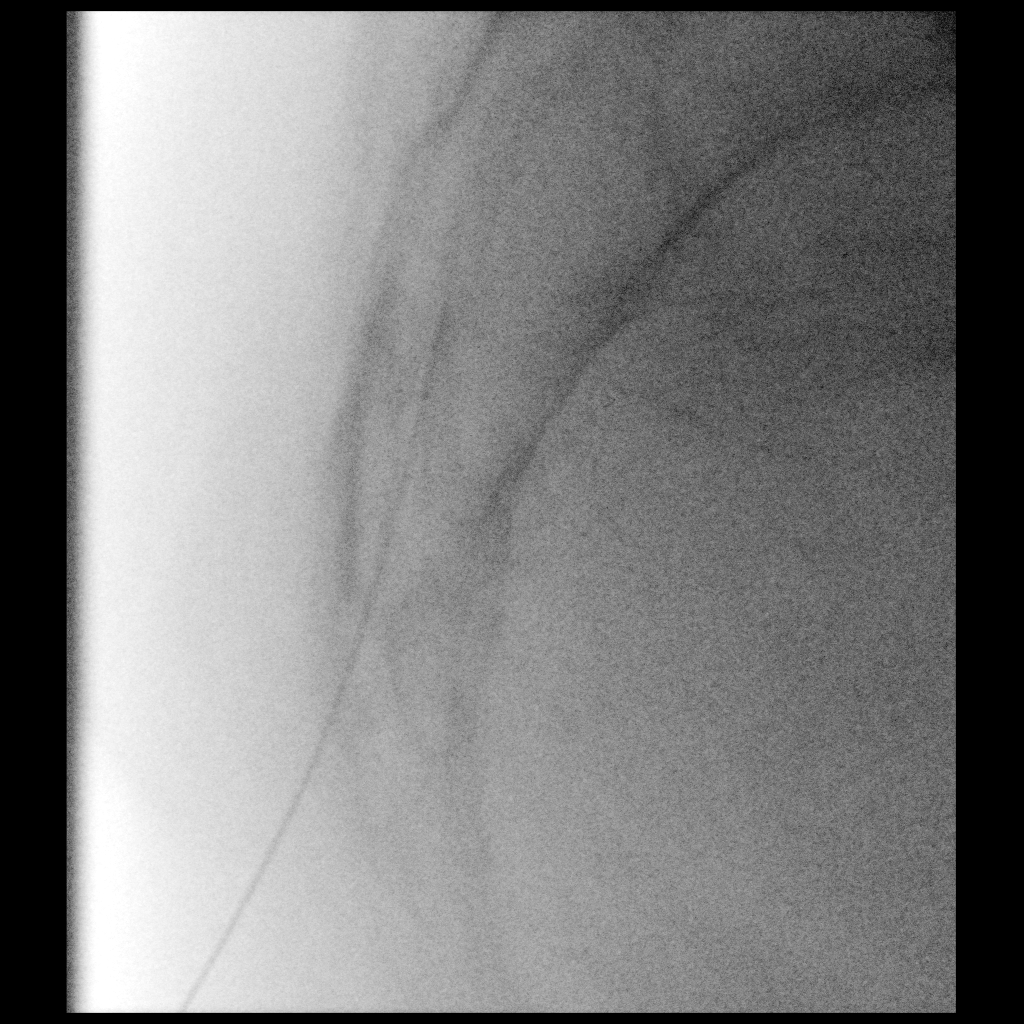

[Series 2: ortho standard · 1 of 1 slices shown (2 of 2)]
[im 1/1]
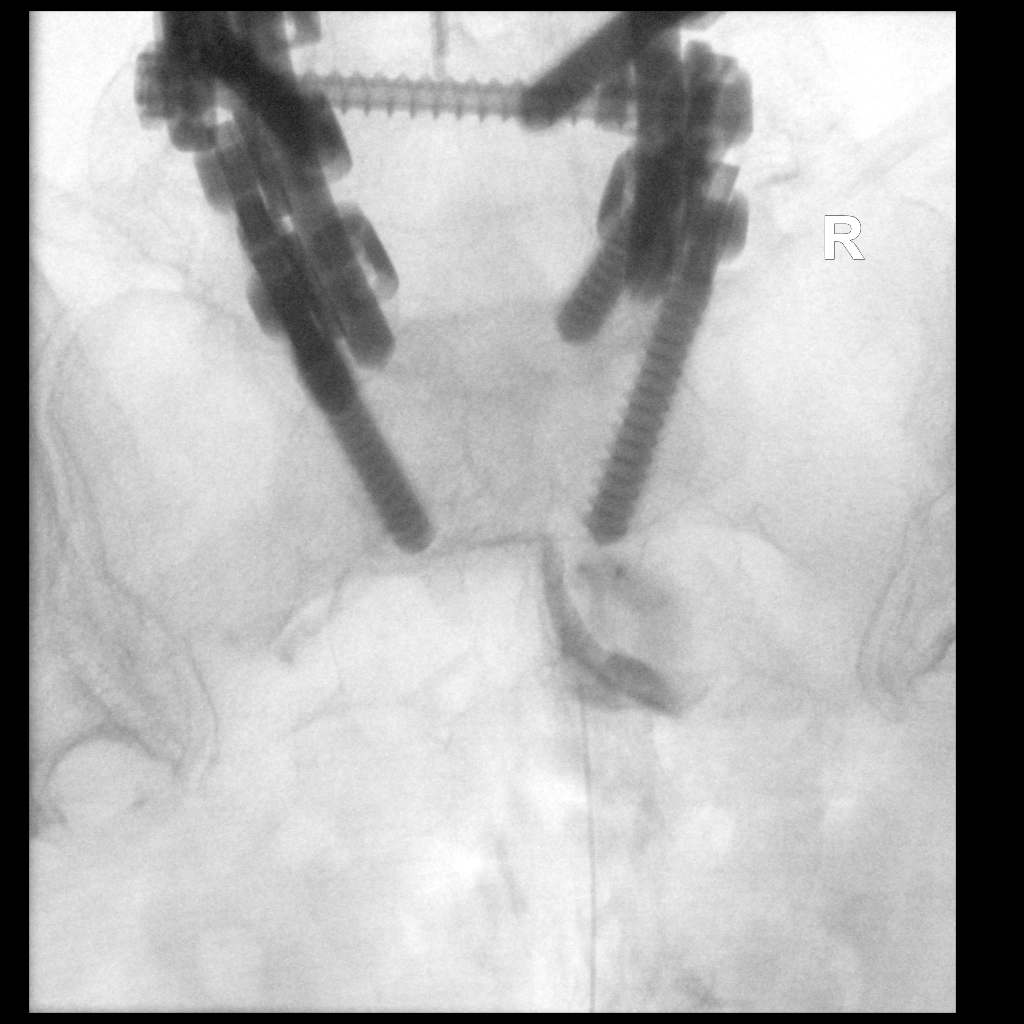

[2 of 2 positions shown; findings below may reference images not displayed]

FLUOROSCOPY TIME:  0 minutes 38 seconds. 157.83 micro gray meter
squared

EXAM:
CAUDAL EPIDURAL INJECTION

Utilizing a caudal approach, the skin overlying the sacral hiatus
was cleansed and anesthetized. A 20 gauge epidural needle was
advanced into the sacral epidural space. Injection of Isovue-M 200
shows a good epidural pattern with spread up to L5-S1. No vascular
opacification is seen.

120 mg of Depo-Medrol mixed with 3 ml of normal saline and 3 ml of
1% Lidocaine were instilled. The procedure was well-tolerated, and
the patient was discharged thirty minutes following the injection in
good condition.
IMPRESSION: Technically successful caudal epidural injection #2..

## 2018-09-25 NOTE — Telephone Encounter (Signed)
Last visit: 08/17/18 Next Visit: 10/03/18 Labs: 07/18/18 CBC stable. CMP WNL.  PLQ Eye Exam: 12/08/17 WNL   Okay to refill per Dr. Corliss Skains

## 2018-09-28 ENCOUNTER — Other Ambulatory Visit: Payer: Self-pay | Admitting: Rheumatology

## 2018-09-28 NOTE — Telephone Encounter (Signed)
Last visit: 08/17/18 Next Visit: 10/03/18  Left message to for patient to call the office. Need to verify Prednisone dose.

## 2018-09-29 NOTE — Telephone Encounter (Signed)
Attempted to contact patient and left message for patient to call the office to verify dose.

## 2018-09-29 NOTE — Telephone Encounter (Signed)
Patient contacted the office and states she is on 3 mg of Prednisone and she does not need a refill at this time.

## 2018-10-03 ENCOUNTER — Encounter: Payer: Self-pay | Admitting: Rheumatology

## 2018-10-03 ENCOUNTER — Ambulatory Visit (INDEPENDENT_AMBULATORY_CARE_PROVIDER_SITE_OTHER): Payer: Self-pay

## 2018-10-03 ENCOUNTER — Ambulatory Visit: Payer: Medicare Other | Admitting: Rheumatology

## 2018-10-03 VITALS — BP 117/74 | HR 92 | Resp 16 | Ht 68.0 in | Wt 198.6 lb

## 2018-10-03 DIAGNOSIS — M170 Bilateral primary osteoarthritis of knee: Secondary | ICD-10-CM

## 2018-10-03 DIAGNOSIS — M25561 Pain in right knee: Secondary | ICD-10-CM

## 2018-10-03 DIAGNOSIS — G8929 Other chronic pain: Secondary | ICD-10-CM

## 2018-10-03 DIAGNOSIS — M8589 Other specified disorders of bone density and structure, multiple sites: Secondary | ICD-10-CM

## 2018-10-03 DIAGNOSIS — Z9889 Other specified postprocedural states: Secondary | ICD-10-CM

## 2018-10-03 DIAGNOSIS — Z79899 Other long term (current) drug therapy: Secondary | ICD-10-CM

## 2018-10-03 DIAGNOSIS — Z8679 Personal history of other diseases of the circulatory system: Secondary | ICD-10-CM

## 2018-10-03 DIAGNOSIS — M19041 Primary osteoarthritis, right hand: Secondary | ICD-10-CM

## 2018-10-03 DIAGNOSIS — G4709 Other insomnia: Secondary | ICD-10-CM

## 2018-10-03 DIAGNOSIS — M51369 Other intervertebral disc degeneration, lumbar region without mention of lumbar back pain or lower extremity pain: Secondary | ICD-10-CM

## 2018-10-03 DIAGNOSIS — M19011 Primary osteoarthritis, right shoulder: Secondary | ICD-10-CM

## 2018-10-03 DIAGNOSIS — Z8639 Personal history of other endocrine, nutritional and metabolic disease: Secondary | ICD-10-CM

## 2018-10-03 DIAGNOSIS — M5136 Other intervertebral disc degeneration, lumbar region: Secondary | ICD-10-CM

## 2018-10-03 DIAGNOSIS — R5383 Other fatigue: Secondary | ICD-10-CM

## 2018-10-03 DIAGNOSIS — M353 Polymyalgia rheumatica: Secondary | ICD-10-CM | POA: Diagnosis not present

## 2018-10-03 DIAGNOSIS — M19042 Primary osteoarthritis, left hand: Secondary | ICD-10-CM

## 2018-10-03 DIAGNOSIS — M797 Fibromyalgia: Secondary | ICD-10-CM

## 2018-10-16 ENCOUNTER — Other Ambulatory Visit: Payer: Self-pay | Admitting: Rheumatology

## 2018-10-16 NOTE — Telephone Encounter (Signed)
Last Visit: 10/03/18 Next Visit: 04/03/19  Okay to refill per Dr. Deveshwar  

## 2018-10-19 ENCOUNTER — Ambulatory Visit (INDEPENDENT_AMBULATORY_CARE_PROVIDER_SITE_OTHER): Payer: Medicare Other | Admitting: Orthopaedic Surgery

## 2018-10-19 ENCOUNTER — Encounter (INDEPENDENT_AMBULATORY_CARE_PROVIDER_SITE_OTHER): Payer: Self-pay | Admitting: Orthopaedic Surgery

## 2018-10-19 VITALS — BP 121/78 | HR 107 | Ht 68.0 in | Wt 195.0 lb

## 2018-10-19 DIAGNOSIS — M79671 Pain in right foot: Secondary | ICD-10-CM

## 2018-10-19 DIAGNOSIS — M1711 Unilateral primary osteoarthritis, right knee: Secondary | ICD-10-CM

## 2018-10-19 NOTE — Addendum Note (Signed)
Addended by: Wendi Maya on: 10/19/2018 02:16 PM   Modules accepted: Orders

## 2018-10-19 NOTE — Progress Notes (Signed)
Office Visit Note   Patient: [name]           Date of Birth: 25-Mar-1946           MRN: [id number] Visit Date: 10/19/2018              Requested by: Kaleen Mask, MD [address] [place], [place] [address] PCP: Kaleen Mask, MD   Assessment & Plan: Visit Diagnoses:  1. Unilateral primary osteoarthritis, right knee   2. Right foot pain     Plan: Tricompartmental osteoarthritis right knee.  Long discussion regarding diagnosis and treatment options including use of NSAIDs, cortisone, Visco supplementation, bracing and Voltaren gel. Also has pes planus with prominence at the first tarsometatarsal junction.  Posterior tibial tendon appears to be intact.  We will try arch supports and monitor. Office visit over 45 minutes predominately in counseling regarding all of the above we will monitor response over the next several weeks and then return to the office if not feeling any better.  Consider cortisone injection Follow-Up Instructions: Return if symptoms worsen or fail to improve.   Orders:  No orders of the defined types were placed in this encounter.  No orders of the defined types were placed in this encounter.     Procedures: No procedures performed   Clinical Data: No additional findings.   Subjective: Chief Complaint  Patient presents with  . Right Knee - Pain  Patient presents today with right knee pain X1 year. She has had no known injury. She said that it hurts all throughout and gives way occasionally. No popping or grinding in her knee. She saw Dr.Deveswhar and had x-rays taken 10/03/2018.  She takes Aleve as needed for the pain.  Patient also states that she dropped a shopping cart on her right foot many years ago. She has had pain in her right foot since. She said that Dr.Lavendar took her to surgery to shave down a knot that appeared on the top of her foot after injury. She said that it has reappeared again and seems to have grown  in size over time. It is painful to wear shoes or anything that applys pressure to the top of her foot. She wonders if her foot pain is causing her knee to hurt as well. I reviewed films of her right knee in the PACS system.  There are tricompartmental degenerative changes  in the medial compartment.  There is about 1 degree of varus  HPI  Review of Systems  Constitutional: Negative for fatigue.  HENT: Negative for ear pain.   Eyes: Negative for pain.  Respiratory: Negative for shortness of breath.   Cardiovascular: Negative for leg swelling.  Gastrointestinal: Negative for constipation and diarrhea.  Endocrine: Negative for cold intolerance and heat intolerance.  Genitourinary: Negative for difficulty urinating.  Musculoskeletal: Negative for joint swelling.  Skin: Negative for rash.  Allergic/Immunologic: Negative for food allergies.  Neurological: Negative for weakness.  Hematological: Does not bruise/bleed easily.  Psychiatric/Behavioral: Positive for sleep disturbance.     Objective: Vital Signs: BP 121/78   Pulse (!) 107   Ht 5\' 8"  (1.727 m)   Wt 195 lb (88.5 kg)   LMP 09/13/1984 (Approximate)   BMI 29.65 kg/m   Physical Exam Constitutional:      Appearance: She is well-developed.  Eyes:     Pupils: Pupils are equal, round, and reactive to light.  Pulmonary:     Effort: Pulmonary effort is normal.  Skin:  General: Skin is warm and dry.  Neurological:     Mental Status: She is alert and oriented to person, place, and time.  Psychiatric:        Behavior: Behavior normal.     Ortho Exam awake alert and oriented x3.  Comfortable sitting.  Full extension right knee.  No effusion.  Predominately medial joint pain.  No instability.  Flexed but 107 degrees with a goniometer.  No distal edema.  Neurologically intact.  Straight leg raise painless range of motion both hips.  Right foot has some prominence about the metatarsal tarsal joint of great toe.  Significant  pronation with weightbearing.  Some tenderness on the posterior tibial tendon but his function is intact.  No specific ankle pain. Specialty Comments:  No specialty comments available.  Imaging: No results found.   PMFS History: Patient Active Problem List   Diagnosis Date Noted  . Unilateral primary osteoarthritis, right knee 10/19/2018  . Right foot pain 10/19/2018  . Incontinence of feces 09/21/2018  . Vulvar lesion 07/20/2018  . History of repair of right rotator cuff 03/03/2017  . Primary osteoarthritis of both hands/ has had bilateral [facility] surgery  03/03/2017  . History of hypertension 03/03/2017  . History of hyperlipidemia 03/03/2017  . DDD (degenerative disc disease), lumbar/ history of lumbar surgery  03/03/2017  . Osteopenia of multiple sites 03/03/2017  . Chronic left shoulder pain 01/20/2017  . Primary osteoarthritis of both knees 01/20/2017  . Trapezius muscle spasm 01/20/2017  . Insomnia 01/20/2017  . Fatigue 01/20/2017  . Fibromyalgia 01/20/2017  . High risk medications (not anticoagulants) long-term use 01/20/2017  . PMR (polymyalgia rheumatica) (HCC) 01/20/2017  . Left ovarian cyst 06/02/2015  . Degenerative joint disease, shoulder, right 08/02/2013  . Precordial pain 02/11/2012  . HTN (hypertension) 02/11/2012  . Hyperlipidemia 02/11/2012   Past Medical History:  Diagnosis Date  . Bilateral ovarian cysts 09/12/2014   bilateral simple ovarian cysts.  US due in January 2018.  . 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 Fibromyalgia   . HTN (hypertension)    under control with meds., has been on med. x 20 yr.  . Hyperlipidemia   . Hypothyroid   . IBS (irritable bowel syndrome)   . Impingement syndrome of right shoulder 07/2013  . PONV (postoperative nausea and vomiting)   . Rupture of right rotator cuff 07/2013  . VIN I (vulvar intraepithelial neoplasia I) 2017, 2018   this is from a perineal/vulvar biopsy.  . Vitamin B12 deficiency     . Wears partial dentures    lower    Family History  Problem Relation Age of Onset  . Aortic aneurysm Father   . Hypertension Father   . Coronary artery disease Mother [id number]  . Hypertension Mother   . Hyperlipidemia Brother        brain and kidney tumor, no treatment needed     Past Surgical History:  Procedure Laterality Date  . ANTERIOR LAT LUMBAR FUSION  10/08/2008   L3-4  . BACK SURGERY    . BACK SURGERY  10-03-14   [facility]  . COLONOSCOPY W/ BIOPSIES  11/2009   polyps  . COLONOSCOPY W/ BIOPSIES  11/2012   polyp, recheck in 3 years  . CYSTO  07/05/2006  . CYSTOCELE REPAIR  07/05/2006  . ESOPHAGOGASTRODUODENOSCOPY ENDOSCOPY  05/25/05   erythema and edema of gastric lumen  . EYE SURGERY Right    muscle adjustment   .  FOOT SURGERY Right   . GANGLION CYST EXCISION Right    wrist  . INCONTINENCE SURGERY  07/05/2006   cystourethropexy  . ORIF FOREARM FRACTURE Right 09/2012  . SHOULDER ARTHROSCOPY WITH ROTATOR CUFF REPAIR AND SUBACROMIAL DECOMPRESSION Right 08/02/2013   Procedure: SHOULDER ARTHROSCOPY WITH ROTATOR CUFF REPAIR AND SUBACROMIAL DECOMPRESSION, EXTENSIVE DEBRIDEMENT (INCLUDING LABRUM), PARTIAL ACROMIPLASY WITH CORACOACROMIAL RELEASE, DISTAL CLAVICULECTOMY;  Surgeon: Loreta Ave, MD;  Location: [facility];  Service: Orthopedics;  Laterality: Right;  . STRABISMUS SURGERY Right 01/26/2008   2 other surgeries previously  . STRABISMUS SURGERY Right 06/03/2017   Procedure: REPAIR STRABISMUS RIGHT EYE;  Surgeon: Verne Carrow, MD;  Location: [facility];  Service: Ophthalmology;  Laterality: Right;  . Thumb surgery  2/13 & ?2010   Bilateral - states a joint was replaced  . TOTAL VAGINAL HYSTERECTOMY  1986 - or age 24   secondary AUB  . TUBAL LIGATION     laparosopic BTL age 64  . varicose veins Bilateral    Social History   Occupational History    Employer: UNEMPLOYED  Tobacco Use  . Smoking status: Never  Smoker  . Smokeless tobacco: Never Used  Substance and Sexual Activity  . Alcohol use: No    Alcohol/week: 0.0 standard drinks  . Drug use: No  . Sexual activity: Not Currently    Partners: Male    Birth control/protection: Surgical    Comment: Hyst

## 2018-11-16 ENCOUNTER — Other Ambulatory Visit: Payer: Self-pay | Admitting: Rheumatology

## 2018-11-17 NOTE — Telephone Encounter (Signed)
Last Visit: 10/03/18 Next Visit: 04/03/19  Okay to refill per Dr. Corliss Skains

## 2018-11-27 ENCOUNTER — Telehealth (INDEPENDENT_AMBULATORY_CARE_PROVIDER_SITE_OTHER): Payer: Self-pay | Admitting: Rheumatology

## 2018-11-27 NOTE — Telephone Encounter (Signed)
Received call from [facility] @ [facility]. Needing records for appt tomorrow. I faxed to her [phone]. Ph [phone]

## 2018-12-16 ENCOUNTER — Other Ambulatory Visit: Payer: Self-pay | Admitting: Rheumatology

## 2018-12-18 NOTE — Telephone Encounter (Signed)
I called patient and advised her to increase the prednisone dose to 3 mg p.o. daily.  If she does not notice any improvement in her symptoms then we can increase it further to 4 or 5 mg p.o. daily.  She was in agreement.  She will let us know if she does not respond to the increased dose.

## 2018-12-18 NOTE — Telephone Encounter (Addendum)
Last Visit: 10/03/18 Next Visit: 04/03/19  Patient states she has tapered down to 1 mg of Prednisone. Patient is also on PLQ. Patient states she is having trouble lifting her arms and pain in her legs. Patient would like to know about increasing her Prednisone.

## 2018-12-21 ENCOUNTER — Ambulatory Visit (INDEPENDENT_AMBULATORY_CARE_PROVIDER_SITE_OTHER): Payer: Medicare Other | Admitting: Orthopaedic Surgery

## 2019-01-12 ENCOUNTER — Other Ambulatory Visit: Payer: Self-pay | Admitting: Rheumatology

## 2019-01-12 NOTE — Telephone Encounter (Signed)
Last Visit: 10/03/18 Next Visit: 04/03/19  Okay to refill per Dr. Corliss Skains

## 2019-01-18 ENCOUNTER — Ambulatory Visit: Payer: Medicare Other | Admitting: Physician Assistant

## 2019-01-31 ENCOUNTER — Encounter: Payer: Self-pay | Admitting: Physician Assistant

## 2019-01-31 NOTE — H&P (Signed)
TOTAL KNEE ADMISSION H&P  Patient is being admitted for right total knee arthroplasty.  Subjective:  Chief Complaint:right knee pain.  HPI: [name], 73 y.o. female, has a history of pain and functional disability in the right knee due to arthritis and has failed non-surgical conservative treatments for greater than 12 weeks to includeNSAID's and/or analgesics, corticosteriod injections, viscosupplementation injections, flexibility and strengthening excercises, use of assistive devices and activity modification.  Onset of symptoms was gradual, starting 10 years ago with gradually worsening course since that time. The patient noted no past surgery on the right knee(s).  Patient currently rates pain in the right knee(s) at 10 out of 10 with activity. Patient has night pain, worsening of pain with activity and weight bearing, pain that interferes with activities of daily living, crepitus and joint swelling.  Patient has evidence of subchondral sclerosis, periarticular osteophytes and joint space narrowing by imaging studies.There is no active infection.  Patient Active Problem List   Diagnosis Date Noted  . Primary localized osteoarthritis of right knee 10/19/2018  . Right foot pain 10/19/2018  . Incontinence of feces 09/21/2018  . Vulvar lesion 07/20/2018  . History of repair of right rotator cuff 03/03/2017  . Primary osteoarthritis of both hands/ has had bilateral [facility] surgery  03/03/2017  . History of hypertension 03/03/2017  . History of hyperlipidemia 03/03/2017  . DDD (degenerative disc disease), lumbar/ history of lumbar surgery  03/03/2017  . Osteopenia of multiple sites 03/03/2017  . Chronic left shoulder pain 01/20/2017  . Primary osteoarthritis of both knees 01/20/2017  . Trapezius muscle spasm 01/20/2017  . Insomnia 01/20/2017  . Fatigue 01/20/2017  . Fibromyalgia 01/20/2017  . High risk medications (not anticoagulants) long-term use 01/20/2017  . PMR (polymyalgia  rheumatica) (HCC) 01/20/2017  . Left ovarian cyst 06/02/2015  . Degenerative joint disease, shoulder, right 08/02/2013  . Precordial pain 02/11/2012  . HTN (hypertension) 02/11/2012  . Hyperlipidemia 02/11/2012   Past Medical History:  Diagnosis Date  . Bilateral ovarian cysts 09/12/2014   bilateral simple ovarian cysts.  [place] due in January 2018.  . 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 Fibromyalgia   . HTN (hypertension)    under control with meds., has been on med. x 20 yr.  . Hyperlipidemia   . Hypothyroid   . IBS (irritable bowel syndrome)   . Impingement syndrome of right shoulder 07/2013  . PONV (postoperative nausea and vomiting)   . Primary localized osteoarthritis of right knee   . Rupture of right rotator cuff 07/2013  . VIN I (vulvar intraepithelial neoplasia I) 2017, 2018   this is from a perineal/vulvar biopsy.  . Vitamin B12 deficiency   . Wears partial dentures    lower    Past Surgical History:  Procedure Laterality Date  . ANTERIOR LAT LUMBAR FUSION  10/08/2008   L3-4  . BACK SURGERY    . BACK SURGERY  10-03-14   --Dr. Trey [facility]ail[facility]  . COLONOSCOPY W/ BIOPSIES  11/2009   polyps  . COLONOSCOPY W/ BIOPSIES  11/2012   polyp, recheck in 3 years  . CYSTO  07/05/2006  . CYSTOCELE REPAIR  07/05/2006  . ESOPHAGOGASTRODUODENOSCOPY ENDOSCOPY  05/25/05   erythema and edema of gastric lumen  . EYE SURGERY Right    muscle adjustment   . FOOT SURGERY Right   . GANGLION CYST EXCISION Right    wrist  . INCONTINENCE SURGERY  07/05/2006   cystourethropexy  . ORIF FOREARM  FRACTURE Right 09/2012  . SHOULDER ARTHROSCOPY WITH ROTATOR CUFF REPAIR AND SUBACROMIAL DECOMPRESSION Right 08/02/2013   Procedure: SHOULDER ARTHROSCOPY WITH ROTATOR CUFF REPAIR AND SUBACROMIAL DECOMPRESSION, EXTENSIVE DEBRIDEMENT (INCLUDING LABRUM), PARTIAL ACROMIPLASY WITH CORACOACROMIAL RELEASE, DISTAL CLAVICULECTOMY;  Surgeon: Loreta Aveaniel F Murphy, MD;  Location: [facility];  Service: Orthopedics;  Laterality: Right;  . STRABISMUS SURGERY Right 01/26/2008   2 other surgeries previously  . STRABISMUS SURGERY Right 06/03/2017   Procedure: REPAIR STRABISMUS RIGHT EYE;  Surgeon: Verne CarrowYoung, William, MD;  Location: [facility];  Service: Ophthalmology;  Laterality: Right;  . Thumb surgery  2/13 & ?2010   Bilateral - states a joint was replaced  . TOTAL VAGINAL HYSTERECTOMY  1986 - or age 73   secondary AUB  . TUBAL LIGATION     laparosopic BTL age 73  . varicose veins Bilateral     No current facility-administered medications for this encounter.    Current Outpatient Medications  Medication Sig Dispense Refill Last Dose  . alendronate (FOSAMAX) 70 MG tablet TAKE 1 TAB 30 MIN BEFORE THE FIRST FOOD, DRINK OR MED OF THE DAY WITH 8 OZ WATER ONCE A WEEK   Taking  . amitriptyline (ELAVIL) 25 MG tablet Take 25 mg by mouth.   Taking  . amLODipine (NORVASC) 5 MG tablet Take 5 mg by mouth daily.  0 Taking  . benazepril (LOTENSIN) 40 MG tablet    Taking  . Calcium Carb-Cholecalciferol (CALCIUM 1000 + D PO) Take 1 tablet by mouth daily.   Taking  . Cholecalciferol (VITAMIN D PO) Take by mouth daily.   Taking  . diclofenac sodium (VOLTAREN) 1 % GEL Apply 3 gm to 3 large joints up to 3 times a day.Dispense 3 tubes with 3 refills. 3 Tube 1 Taking  . hydroxychloroquine (PLAQUENIL) 200 MG tablet TAKE 1 TABLET BY MOUTH TWICE A DAY 180 tablet 0 Taking  . levothyroxine (SYNTHROID, LEVOTHROID) 112 MCG tablet Take 112 mcg by mouth daily before breakfast.   Taking  . Multiple Vitamin (MULTIVITAMIN) tablet Take 1 tablet by mouth daily.   Taking  . Omega-3 Fatty Acids (FISH OIL) 1000 MG CAPS Take 1 capsule by mouth daily.   Taking  . predniSONE (DELTASONE) 1 MG tablet TAKE 3 TABLETS (3 MG TOTAL) BY MOUTH DAILY WITH BREAKFAST. 90 tablet 0   . vitamin B-12 (CYANOCOBALAMIN) 1000 MCG tablet Take 1,000 mcg by mouth once a week.    Taking   Allergies  Allergen  Reactions  . Penicillins Itching    OF MOUTH  . Sulfa Antibiotics Itching    OF MOUTH    Social History   Tobacco Use  . Smoking status: Never Smoker  . Smokeless tobacco: Never Used  Substance Use Topics  . Alcohol use: No    Alcohol/week: 0.0 standard drinks    Family History  Problem Relation Age of Onset  . Aortic aneurysm Father   . Hypertension Father   . Coronary artery disease Mother [id number]  . Hypertension Mother   . Hyperlipidemia Brother        brain and kidney tumor, no treatment needed      ROS  Objective:  Physical Exam  Vital signs in last 24 hours: Temp:  [98.4 F (36.9 C)] 98.4 F (36.9 C) (05/20 1500) Pulse Rate:  [73] 73 (05/20 1500) BP: (137)/(81) 137/81 (05/20 1500) Weight:  [82.1 kg] 82.1 kg (05/20 1500)  Labs:   Estimated body mass index is 27.52 kg/m as calculated  from the following:   Height as of this encounter:  (1.727 m).   Weight as of this encounter: 82.1 kg.   Imaging Review Plain radiographs demonstrate severe degenerative joint disease of the right knee(s). The overall alignment issignificant varus. The bone quality appears to be good for age and reported activity level.      Assessment/Plan:  End stage arthritis, right knee   The patient history, physical examination, clinical judgment of the provider and imaging studies are consistent with end stage degenerative joint disease of the right knee(s) and total knee arthroplasty is deemed medically necessary. The treatment options including medical management, injection therapy arthroscopy and arthroplasty were discussed at length. The risks and benefits of total knee arthroplasty were presented and reviewed. The risks due to aseptic loosening, infection, stiffness, patella tracking problems, thromboembolic complications and other imponderables were discussed. The patient acknowledged the explanation, agreed to proceed with the plan and consent was signed. Patient is being  admitted for inpatient treatment for surgery, pain control, PT, OT, prophylactic antibiotics, VTE prophylaxis, progressive ambulation and ADL's and discharge planning. The patient is planning to be discharged home with home health services  Patient is precerted as an outpatient     Patient's anticipated LOS is less than 2 midnights, meeting these requirements: - Younger than 72 - Lives within 1 hour of care - Has a competent adult at home to recover with post-op recover - NO history of  - Chronic pain requiring opiods  - Diabetes  - Coronary Artery Disease  - Heart failure  - Heart attack  - Stroke  - DVT/VTE  - Cardiac arrhythmia  - Respiratory Failure/COPD  - Renal failure  - Anemia  - Advanced Liver disease

## 2019-02-07 NOTE — Patient Instructions (Addendum)
YOU ARE REQUIRED TO BE TESTED FOR COVID-19 PRIOR TO YOUR SURGERY . YOUR TEST MUST BE COMPLETED ON Thursday Feb 08, 2019.  TESTING IS LOCATED AT THE [facility] ENTRANCE FROM 9:00AM - 3:00PM. FAILURE TO COMPLETE TESTING MAY RESULT IN CANCELLATION OF YOUR SURGERY.                  [name]      Your procedure is scheduled on:  02-12-2019    Report to [facility] Main  Entrance     Report to SHORT STAY at 5:30AM      Call this number if you have problems the morning of surgery [phone]      Remember: Do not eat food After Midnight. YOU MAY HAVE CLEAR LIQUIDS FROM MIDNIGHT UNTIL 4:30AM. At 4:30AM Please finish the prescribed Pre-Surgery ENSURE drink. Nothing by mouth after you finish the ENSURE drink !     CLEAR LIQUID DIET   Foods Allowed                                                                     Foods Excluded  Coffee and tea, regular and decaf                             liquids that you cannot  Plain Jell-O in any flavor                                             see through such as: Fruit ices (not with fruit pulp)                                     milk, soups, orange juice  Iced Popsicles                                    All solid food Carbonated beverages, regular and diet                                    Cranberry, grape and apple juices Sports drinks like Gatorade Lightly seasoned clear broth or consume(fat free) Sugar, honey syrup  Sample Menu Breakfast                                Lunch                                     Supper Cranberry juice                    Beef broth  Chicken broth Jell-O                                     Grape juice                           Apple juice Coffee or tea                        Jell-O                                      Popsicle                                                Coffee or tea                        Coffee or  tea  _____________________________________________________________________     BRUSH YOUR TEETH MORNING OF SURGERY AND RINSE YOUR MOUTH OUT, NO CHEWING GUM CANDY OR MINTS.     Take these medicines the morning of surgery with A SIP OF WATER: Levothyroxine, Prednisone, Plaquenil                                  You may not have any metal on your body including hair pins and              piercings  Do not wear jewelry, make-up, lotions, powders or perfumes, deodorant             Do not wear nail polish.  Do not shave  48 hours prior to surgery.       Do not bring valuables to the hospital. [facility] IS NOT             RESPONSIBLE   FOR VALUABLES.  Contacts, dentures or bridgework may not be worn into surgery.                Please read over the following fact sheets you were given: _____________________________________________________________________             [facility] - Preparing for Surgery Before surgery, you can play an important role.  Because skin is not sterile, your skin needs to be as free of germs as possible.  You can reduce the number of germs on your skin by washing with CHG (chlorahexidine gluconate) soap before surgery.  CHG is an antiseptic cleaner which kills germs and bonds with the skin to continue killing germs even after washing. Please DO NOT use if you have an allergy to CHG or antibacterial soaps.  If your skin becomes reddened/irritated stop using the CHG and inform your nurse when you arrive at Short Stay. Do not shave (including legs and underarms) for at least 48 hours prior to the first CHG shower.  You may shave your face/neck. Please follow these instructions carefully:  1.  Shower with CHG Soap the night before surgery and the  morning of Surgery.  2.  If you choose to wash your hair, wash your hair first  as usual with your  normal  shampoo.  3.  After you shampoo, rinse your hair and body thoroughly to remove the  shampoo.                            4.  Use CHG as you would any other liquid soap.  You can apply chg directly  to the skin and wash                       Gently with a scrungie or clean washcloth.  5.  Apply the CHG Soap to your body ONLY FROM THE NECK DOWN.   Do not use on face/ open                           Wound or open sores. Avoid contact with eyes, ears mouth and genitals (private parts).                       Wash face,  Genitals (private parts) with your normal soap.             6.  Wash thoroughly, paying special attention to the area where your surgery  will be performed.  7.  Thoroughly rinse your body with warm water from the neck down.  8.  DO NOT shower/wash with your normal soap after using and rinsing off  the CHG Soap.                9.  Pat yourself dry with a clean towel.            10.  Wear clean pajamas.            11.  Place clean sheets on your bed the night of your first shower and do not  sleep with pets. Day of Surgery : Do not apply any lotions/deodorants the morning of surgery.  Please wear clean clothes to the hospital/surgery center.  FAILURE TO FOLLOW THESE INSTRUCTIONS MAY RESULT IN THE CANCELLATION OF YOUR SURGERY PATIENT SIGNATURE_________________________________  NURSE SIGNATURE__________________________________  ________________________________________________________________________   [name]  An incentive spirometer is a tool that can help keep your lungs clear and active. This tool measures how well you are filling your lungs with each breath. Taking long deep breaths may help reverse or decrease the chance of developing breathing (pulmonary) problems (especially infection) following:  A long period of time when you are unable to move or be active. BEFORE THE PROCEDURE   If the spirometer includes an indicator to show your best effort, your nurse or respiratory therapist will set it to a desired goal.  If possible, sit up straight or lean slightly forward. Try  not to slouch.  Hold the incentive spirometer in an upright position. INSTRUCTIONS FOR USE  1. Sit on the edge of your bed if possible, or sit up as far as you can in bed or on a chair. 2. Hold the incentive spirometer in an upright position. 3. Breathe out normally. 4. Place the mouthpiece in your mouth and seal your lips tightly around it. 5. Breathe in slowly and as deeply as possible, raising the piston or the ball toward the top of the column. 6. Hold your breath for 3-5 seconds or for as long as possible. Allow the piston or ball to fall to the bottom of the column. 7. Remove the  mouthpiece from your mouth and breathe out normally. 8. Rest for a few seconds and repeat Steps 1 through 7 at least 10 times every 1-2 hours when you are awake. Take your time and take a few normal breaths between deep breaths. 9. The spirometer may include an indicator to show your best effort. Use the indicator as a goal to work toward during each repetition. 10. After each set of 10 deep breaths, practice coughing to be sure your lungs are clear. If you have an incision (the cut made at the time of surgery), support your incision when coughing by placing a pillow or rolled up towels firmly against it. Once you are able to get out of bed, walk around indoors and cough well. You may stop using the incentive spirometer when instructed by your caregiver.  RISKS AND COMPLICATIONS  Take your time so you do not get dizzy or light-headed.  If you are in pain, you may need to take or ask for pain medication before doing incentive spirometry. It is harder to take a deep breath if you are having pain. AFTER USE  Rest and breathe slowly and easily.  It can be helpful to keep track of a log of your progress. Your caregiver can provide you with a simple table to help with this. If you are using the spirometer at home, follow these instructions: SEEK MEDICAL CARE IF:   You are having difficultly using the  spirometer.  You have trouble using the spirometer as often as instructed.  Your pain medication is not giving enough relief while using the spirometer.  You develop fever of 100.5 F (38.1 C) or higher. SEEK IMMEDIATE MEDICAL CARE IF:   You cough up bloody sputum that had not been present before.  You develop fever of 102 F (38.9 C) or greater.  You develop worsening pain at or near the incision site. MAKE SURE YOU:   Understand these instructions.  Will watch your condition.  Will get help right away if you are not doing well or get worse. Document Released: 01/10/2007 Document Revised: 11/22/2011 Document Reviewed: 03/13/2007 [facility] Patient Information 2014 [place], [place].   ________________________________________________________________________

## 2019-02-08 ENCOUNTER — Other Ambulatory Visit (HOSPITAL_COMMUNITY)
Admission: RE | Admit: 2019-02-08 | Discharge: 2019-02-08 | Disposition: A | Discharge status: Home / Self Care | Payer: Medicare Other | Source: Ambulatory Visit | Attending: Orthopedic Surgery | Admitting: Orthopedic Surgery

## 2019-02-08 ENCOUNTER — Encounter (HOSPITAL_COMMUNITY): Payer: Self-pay

## 2019-02-08 ENCOUNTER — Other Ambulatory Visit: Payer: Self-pay

## 2019-02-08 ENCOUNTER — Encounter (HOSPITAL_COMMUNITY)
Admission: RE | Admit: 2019-02-08 | Discharge: 2019-02-08 | Disposition: A | Discharge status: Home / Self Care | Payer: Medicare Other | Source: Ambulatory Visit | Attending: Orthopedic Surgery | Admitting: Orthopedic Surgery

## 2019-02-08 DIAGNOSIS — Z01818 Encounter for other preprocedural examination: Secondary | ICD-10-CM | POA: Diagnosis present

## 2019-02-08 DIAGNOSIS — I10 Essential (primary) hypertension: Secondary | ICD-10-CM | POA: Diagnosis not present

## 2019-02-08 DIAGNOSIS — Z1159 Encounter for screening for other viral diseases: Secondary | ICD-10-CM | POA: Diagnosis not present

## 2019-02-08 HISTORY — DX: Polymyalgia rheumatica: M35.3

## 2019-02-08 LAB — CBC WITH DIFFERENTIAL/PLATELET
Abs Immature Granulocytes: 0.03 10*3/uL (ref 0.00–0.07)
Basophils Absolute: 0.1 10*3/uL (ref 0.0–0.1)
Basophils Relative: 1 %
Eosinophils Absolute: 0.1 10*3/uL (ref 0.0–0.5)
Eosinophils Relative: 1 %
HCT: 39.8 % (ref 36.0–46.0)
Hemoglobin: 12 g/dL (ref 12.0–15.0)
Immature Granulocytes: 0 %
Lymphocytes Relative: 21 %
Lymphs Abs: 2 10*3/uL (ref 0.7–4.0)
MCH: 26.7 pg (ref 26.0–34.0)
MCHC: 30.2 g/dL (ref 30.0–36.0)
MCV: 88.6 fL (ref 80.0–100.0)
Monocytes Absolute: 0.4 10*3/uL (ref 0.1–1.0)
Monocytes Relative: 5 %
Neutro Abs: 7 10*3/uL (ref 1.7–7.7)
Neutrophils Relative %: 72 %
Platelets: 277 10*3/uL (ref 150–400)
RBC: 4.49 MIL/uL (ref 3.87–5.11)
RDW: 16.1 % — ABNORMAL HIGH (ref 11.5–15.5)
WBC: 9.6 10*3/uL (ref 4.0–10.5)
nRBC: 0 % (ref 0.0–0.2)

## 2019-02-08 LAB — COMPREHENSIVE METABOLIC PANEL
ALT: 17 U/L (ref 0–44)
AST: 22 U/L (ref 15–41)
Albumin: 4.1 g/dL (ref 3.5–5.0)
Alkaline Phosphatase: 73 U/L (ref 38–126)
Anion gap: 7 (ref 5–15)
BUN: 15 mg/dL (ref 8–23)
CO2: 28 mmol/L (ref 22–32)
Calcium: 9.3 mg/dL (ref 8.9–10.3)
Chloride: 108 mmol/L (ref 98–111)
Creatinine, Ser: 0.69 mg/dL (ref 0.44–1.00)
GFR calc Af Amer: >60 mL/min (ref >60)
GFR calc non Af Amer: >60 mL/min (ref >60)
Glucose, Bld: 102 mg/dL — ABNORMAL HIGH (ref 70–99)
Potassium: 4.7 mmol/L (ref 3.5–5.1)
Sodium: 143 mmol/L (ref 135–145)
Total Bilirubin: 0.1 mg/dL — ABNORMAL LOW (ref 0.3–1.2)
Total Protein: 7.5 g/dL (ref 6.5–8.1)

## 2019-02-08 LAB — SURGICAL PCR SCREEN
MRSA, PCR: NEGATIVE
Staphylococcus aureus: POSITIVE — AB

## 2019-02-08 LAB — APTT: aPTT: 30 seconds (ref 24–36)

## 2019-02-08 LAB — PROTIME-INR
INR: 1 (ref 0.8–1.2)
Prothrombin Time: 13 seconds (ref 11.4–15.2)

## 2019-02-09 LAB — NOVEL CORONAVIRUS, NAA (HOSP ORDER, SEND-OUT TO REF LAB; TAT 18-24 HRS): SARS-CoV-2, NAA: NOT DETECTED

## 2019-02-09 NOTE — Progress Notes (Signed)
SPOKE W/  [name]     SCREENING SYMPTOMS OF COVID 19:   COUGH--no  RUNNY NOSE--- no  SORE THROAT---no  NASAL CONGESTION----no  SNEEZING----no  SHORTNESS OF BREATH---no  DIFFICULTY BREATHING---no  TEMP >100.0 -----no  UNEXPLAINED BODY ACHES------no  CHILLS -------- no  HEADACHES ---------no  LOSS OF SMELL/ TASTE --------no    HAVE YOU OR ANY FAMILY MEMBER TRAVELLED PAST 14 DAYS OUT OF THE   COUNTY---no STATE----no COUNTRY----no  HAVE YOU OR ANY FAMILY MEMBER BEEN EXPOSED TO ANYONE WITH COVID 19? no

## 2019-02-10 LAB — URINE CULTURE: Culture: >=100000 — AB

## 2019-02-11 MED ORDER — BUPIVACAINE LIPOSOME 1.3 % IJ SUSP
20.0000 mL | INTRAMUSCULAR | Status: DC
  Filled 2019-02-11: qty 20

## 2019-02-12 ENCOUNTER — Observation Stay (HOSPITAL_COMMUNITY)
Admission: RE | Admit: 2019-02-12 | Discharge: 2019-02-13 | Disposition: A | Discharge status: Home / Self Care | Payer: Medicare Other | Attending: Orthopedic Surgery | Admitting: Orthopedic Surgery

## 2019-02-12 ENCOUNTER — Other Ambulatory Visit: Payer: Self-pay

## 2019-02-12 ENCOUNTER — Ambulatory Visit (HOSPITAL_COMMUNITY): Payer: Medicare Other | Admitting: Certified Registered"

## 2019-02-12 ENCOUNTER — Ambulatory Visit (HOSPITAL_COMMUNITY): Payer: Medicare Other | Admitting: Physician Assistant

## 2019-02-12 ENCOUNTER — Encounter (HOSPITAL_COMMUNITY)
Admission: RE | Disposition: A | Discharge status: Home / Self Care | Payer: Self-pay | Source: Home / Self Care | Attending: Orthopedic Surgery

## 2019-02-12 ENCOUNTER — Encounter (HOSPITAL_COMMUNITY): Payer: Self-pay

## 2019-02-12 DIAGNOSIS — Z880 Allergy status to penicillin: Secondary | ICD-10-CM | POA: Diagnosis not present

## 2019-02-12 DIAGNOSIS — Z7952 Long term (current) use of systemic steroids: Secondary | ICD-10-CM | POA: Insufficient documentation

## 2019-02-12 DIAGNOSIS — Z9889 Other specified postprocedural states: Secondary | ICD-10-CM

## 2019-02-12 DIAGNOSIS — M797 Fibromyalgia: Secondary | ICD-10-CM | POA: Diagnosis present

## 2019-02-12 DIAGNOSIS — M19042 Primary osteoarthritis, left hand: Secondary | ICD-10-CM | POA: Diagnosis present

## 2019-02-12 DIAGNOSIS — E785 Hyperlipidemia, unspecified: Secondary | ICD-10-CM | POA: Diagnosis present

## 2019-02-12 DIAGNOSIS — K589 Irritable bowel syndrome without diarrhea: Secondary | ICD-10-CM | POA: Insufficient documentation

## 2019-02-12 DIAGNOSIS — Z79899 Other long term (current) drug therapy: Secondary | ICD-10-CM | POA: Insufficient documentation

## 2019-02-12 DIAGNOSIS — M19041 Primary osteoarthritis, right hand: Secondary | ICD-10-CM | POA: Diagnosis present

## 2019-02-12 DIAGNOSIS — I10 Essential (primary) hypertension: Secondary | ICD-10-CM | POA: Diagnosis not present

## 2019-02-12 DIAGNOSIS — Z79890 Hormone replacement therapy (postmenopausal): Secondary | ICD-10-CM | POA: Diagnosis not present

## 2019-02-12 DIAGNOSIS — M1711 Unilateral primary osteoarthritis, right knee: Principal | ICD-10-CM | POA: Diagnosis present

## 2019-02-12 DIAGNOSIS — M353 Polymyalgia rheumatica: Secondary | ICD-10-CM | POA: Diagnosis present

## 2019-02-12 DIAGNOSIS — M8580 Other specified disorders of bone density and structure, unspecified site: Secondary | ICD-10-CM | POA: Insufficient documentation

## 2019-02-12 DIAGNOSIS — Z882 Allergy status to sulfonamides status: Secondary | ICD-10-CM | POA: Insufficient documentation

## 2019-02-12 DIAGNOSIS — M5136 Other intervertebral disc degeneration, lumbar region: Secondary | ICD-10-CM | POA: Diagnosis not present

## 2019-02-12 DIAGNOSIS — M51369 Other intervertebral disc degeneration, lumbar region without mention of lumbar back pain or lower extremity pain: Secondary | ICD-10-CM | POA: Diagnosis present

## 2019-02-12 DIAGNOSIS — R5383 Other fatigue: Secondary | ICD-10-CM | POA: Diagnosis present

## 2019-02-12 DIAGNOSIS — M170 Bilateral primary osteoarthritis of knee: Secondary | ICD-10-CM | POA: Diagnosis present

## 2019-02-12 DIAGNOSIS — E039 Hypothyroidism, unspecified: Secondary | ICD-10-CM | POA: Diagnosis not present

## 2019-02-12 HISTORY — DX: Unilateral primary osteoarthritis, right knee: M17.11

## 2019-02-12 HISTORY — PX: TOTAL KNEE ARTHROPLASTY: SHX125

## 2019-02-12 SURGERY — ARTHROPLASTY, KNEE, TOTAL
Anesthesia: General | Laterality: Right

## 2019-02-12 MED ORDER — TRANEXAMIC ACID-NACL 1000-0.7 MG/100ML-% IV SOLN
1000.0000 mg | INTRAVENOUS | Status: AC
  Administered 2019-02-12: 1000 mg via INTRAVENOUS
  Filled 2019-02-12: qty 100

## 2019-02-12 MED ORDER — ONDANSETRON HCL 4 MG/2ML IJ SOLN: 4.0000 mg | Freq: Four times a day (QID) | INTRAMUSCULAR | Status: DC | PRN

## 2019-02-12 MED ORDER — CEFAZOLIN SODIUM-DEXTROSE 2-4 GM/100ML-% IV SOLN
2.0000 g | INTRAVENOUS | Status: AC
  Administered 2019-02-12: 2 g via INTRAVENOUS
  Filled 2019-02-12: qty 100

## 2019-02-12 MED ORDER — ACETAMINOPHEN 500 MG PO TABS
ORAL_TABLET | ORAL | Status: AC
  Filled 2019-02-12: qty 1

## 2019-02-12 MED ORDER — POVIDONE-IODINE 10 % EX SWAB
2.0000 "application " | Freq: Once | CUTANEOUS | Status: AC
  Administered 2019-02-12: 2 via TOPICAL

## 2019-02-12 MED ORDER — SUCCINYLCHOLINE CHLORIDE 200 MG/10ML IV SOSY
PREFILLED_SYRINGE | INTRAVENOUS | Status: AC
  Filled 2019-02-12: qty 10

## 2019-02-12 MED ORDER — CHLORHEXIDINE GLUCONATE 4 % EX LIQD: 60.0000 mL | Freq: Once | CUTANEOUS | Status: DC

## 2019-02-12 MED ORDER — MIDAZOLAM HCL 2 MG/2ML IJ SOLN
INTRAMUSCULAR | Status: DC | PRN
  Administered 2019-02-12 (×2): 1 mg via INTRAVENOUS

## 2019-02-12 MED ORDER — DOCUSATE SODIUM 100 MG PO CAPS
100.0000 mg | ORAL_CAPSULE | Freq: Two times a day (BID) | ORAL | Status: DC
  Administered 2019-02-12 – 2019-02-13 (×2): 100 mg via ORAL
  Filled 2019-02-12 (×2): qty 1

## 2019-02-12 MED ORDER — ACETAMINOPHEN 500 MG PO TABS
1000.0000 mg | ORAL_TABLET | Freq: Once | ORAL | Status: AC
  Administered 2019-02-12: 1000 mg via ORAL

## 2019-02-12 MED ORDER — FENTANYL CITRATE (PF) 100 MCG/2ML IJ SOLN
25.0000 ug | INTRAMUSCULAR | Status: DC | PRN
  Administered 2019-02-12 (×2): 25 ug via INTRAVENOUS
  Administered 2019-02-12 (×2): 50 ug via INTRAVENOUS

## 2019-02-12 MED ORDER — DEXAMETHASONE SODIUM PHOSPHATE 10 MG/ML IJ SOLN
INTRAMUSCULAR | Status: AC
  Filled 2019-02-12: qty 1

## 2019-02-12 MED ORDER — ONDANSETRON HCL 4 MG/2ML IJ SOLN
INTRAMUSCULAR | Status: DC | PRN
  Administered 2019-02-12: 4 mg via INTRAVENOUS

## 2019-02-12 MED ORDER — BUPIVACAINE LIPOSOME 1.3 % IJ SUSP
INTRAMUSCULAR | Status: DC | PRN
  Administered 2019-02-12: 20 mL

## 2019-02-12 MED ORDER — 0.9 % SODIUM CHLORIDE (POUR BTL) OPTIME
TOPICAL | Status: DC | PRN
  Administered 2019-02-12: 1000 mL

## 2019-02-12 MED ORDER — LEVOTHYROXINE SODIUM 112 MCG PO TABS
112.0000 ug | ORAL_TABLET | Freq: Every day | ORAL | Status: DC
  Administered 2019-02-13: 112 ug via ORAL
  Filled 2019-02-12: qty 1

## 2019-02-12 MED ORDER — EPHEDRINE 5 MG/ML INJ
INTRAVENOUS | Status: AC
  Filled 2019-02-12: qty 10

## 2019-02-12 MED ORDER — METOCLOPRAMIDE HCL 5 MG PO TABS: 5.0000 mg | ORAL_TABLET | Freq: Three times a day (TID) | ORAL | Status: DC | PRN

## 2019-02-12 MED ORDER — CEFAZOLIN SODIUM-DEXTROSE 2-4 GM/100ML-% IV SOLN
2.0000 g | Freq: Four times a day (QID) | INTRAVENOUS | Status: AC
  Administered 2019-02-12 (×2): 2 g via INTRAVENOUS
  Filled 2019-02-12 (×2): qty 100

## 2019-02-12 MED ORDER — FENTANYL CITRATE (PF) 100 MCG/2ML IJ SOLN
INTRAMUSCULAR | Status: AC
  Filled 2019-02-12: qty 2

## 2019-02-12 MED ORDER — BUPIVACAINE-EPINEPHRINE (PF) 0.25% -1:200000 IJ SOLN
INTRAMUSCULAR | Status: AC
  Filled 2019-02-12: qty 30

## 2019-02-12 MED ORDER — SALINE FLUSH 0.9 % IV SOLN
INTRAVENOUS | Status: DC | PRN
  Administered 2019-02-12: 50 mL

## 2019-02-12 MED ORDER — ROCURONIUM BROMIDE 10 MG/ML (PF) SYRINGE
PREFILLED_SYRINGE | INTRAVENOUS | Status: DC | PRN
  Administered 2019-02-12: 10 mg via INTRAVENOUS
  Administered 2019-02-12: 40 mg via INTRAVENOUS
  Administered 2019-02-12: 10 mg via INTRAVENOUS

## 2019-02-12 MED ORDER — PROPOFOL 10 MG/ML IV BOLUS
INTRAVENOUS | Status: AC
  Filled 2019-02-12: qty 60

## 2019-02-12 MED ORDER — DEXAMETHASONE SODIUM PHOSPHATE 10 MG/ML IJ SOLN
10.0000 mg | Freq: Three times a day (TID) | INTRAMUSCULAR | Status: DC
  Administered 2019-02-12 – 2019-02-13 (×3): 10 mg via INTRAVENOUS
  Filled 2019-02-12 (×3): qty 1

## 2019-02-12 MED ORDER — BUPIVACAINE-EPINEPHRINE 0.25% -1:200000 IJ SOLN
INTRAMUSCULAR | Status: DC | PRN
  Administered 2019-02-12: 30 mL

## 2019-02-12 MED ORDER — LACTATED RINGERS IV SOLN
INTRAVENOUS | Status: DC
  Administered 2019-02-12: 06:00:00 via INTRAVENOUS

## 2019-02-12 MED ORDER — DIPHENHYDRAMINE HCL 12.5 MG/5ML PO ELIX: 12.5000 mg | ORAL_SOLUTION | ORAL | Status: DC | PRN

## 2019-02-12 MED ORDER — METOCLOPRAMIDE HCL 5 MG/ML IJ SOLN: 5.0000 mg | Freq: Three times a day (TID) | INTRAMUSCULAR | Status: DC | PRN

## 2019-02-12 MED ORDER — POVIDONE-IODINE 10 % EX SWAB: 2.0000 "application " | Freq: Once | CUTANEOUS | Status: DC

## 2019-02-12 MED ORDER — LIDOCAINE 2% (20 MG/ML) 5 ML SYRINGE
INTRAMUSCULAR | Status: AC
  Filled 2019-02-12: qty 5

## 2019-02-12 MED ORDER — SODIUM CHLORIDE (PF) 0.9 % IJ SOLN
INTRAMUSCULAR | Status: AC
  Filled 2019-02-12: qty 50

## 2019-02-12 MED ORDER — FENTANYL CITRATE (PF) 100 MCG/2ML IJ SOLN
INTRAMUSCULAR | Status: DC | PRN
  Administered 2019-02-12 (×4): 50 ug via INTRAVENOUS

## 2019-02-12 MED ORDER — ONDANSETRON HCL 4 MG/2ML IJ SOLN
INTRAMUSCULAR | Status: AC
  Filled 2019-02-12: qty 2

## 2019-02-12 MED ORDER — MENTHOL 3 MG MT LOZG: 1.0000 | LOZENGE | OROMUCOSAL | Status: DC | PRN

## 2019-02-12 MED ORDER — SUCCINYLCHOLINE CHLORIDE 200 MG/10ML IV SOSY
PREFILLED_SYRINGE | INTRAVENOUS | Status: DC | PRN
  Administered 2019-02-12: 100 mg via INTRAVENOUS

## 2019-02-12 MED ORDER — PHENOL 1.4 % MT LIQD
1.0000 | OROMUCOSAL | Status: DC | PRN
  Filled 2019-02-12: qty 177

## 2019-02-12 MED ORDER — ROPIVACAINE HCL 7.5 MG/ML IJ SOLN
INTRAMUSCULAR | Status: DC | PRN
  Administered 2019-02-12: 20 mL via PERINEURAL

## 2019-02-12 MED ORDER — PROPOFOL 500 MG/50ML IV EMUL
INTRAVENOUS | Status: DC | PRN
  Administered 2019-02-12: 25 ug/kg/min via INTRAVENOUS

## 2019-02-12 MED ORDER — EPHEDRINE SULFATE-NACL 50-0.9 MG/10ML-% IV SOSY
PREFILLED_SYRINGE | INTRAVENOUS | Status: DC | PRN
  Administered 2019-02-12: 10 mg via INTRAVENOUS

## 2019-02-12 MED ORDER — POLYETHYLENE GLYCOL 3350 17 G PO PACK
17.0000 g | PACK | Freq: Two times a day (BID) | ORAL | Status: DC
  Administered 2019-02-12 – 2019-02-13 (×2): 17 g via ORAL
  Filled 2019-02-12 (×2): qty 1

## 2019-02-12 MED ORDER — PROPOFOL 10 MG/ML IV BOLUS
INTRAVENOUS | Status: DC | PRN
  Administered 2019-02-12: 130 mg via INTRAVENOUS

## 2019-02-12 MED ORDER — ONDANSETRON HCL 4 MG/2ML IJ SOLN: 4.0000 mg | Freq: Once | INTRAMUSCULAR | Status: DC | PRN

## 2019-02-12 MED ORDER — SUGAMMADEX SODIUM 200 MG/2ML IV SOLN
INTRAVENOUS | Status: DC | PRN
  Administered 2019-02-12: 170 mg via INTRAVENOUS

## 2019-02-12 MED ORDER — ALUM & MAG HYDROXIDE-SIMETH 200-200-20 MG/5ML PO SUSP: 30.0000 mL | ORAL | Status: DC | PRN

## 2019-02-12 MED ORDER — ONDANSETRON HCL 4 MG PO TABS: 4.0000 mg | ORAL_TABLET | Freq: Four times a day (QID) | ORAL | Status: DC | PRN

## 2019-02-12 MED ORDER — ASPIRIN EC 325 MG PO TBEC
325.0000 mg | DELAYED_RELEASE_TABLET | Freq: Every day | ORAL | Status: DC
  Administered 2019-02-13: 325 mg via ORAL
  Filled 2019-02-12: qty 1

## 2019-02-12 MED ORDER — MIDAZOLAM HCL 2 MG/2ML IJ SOLN
INTRAMUSCULAR | Status: AC
  Filled 2019-02-12: qty 2

## 2019-02-12 MED ORDER — LIDOCAINE 2% (20 MG/ML) 5 ML SYRINGE
INTRAMUSCULAR | Status: DC | PRN
  Administered 2019-02-12: 80 mg via INTRAVENOUS

## 2019-02-12 MED ORDER — SODIUM CHLORIDE 0.9 % IR SOLN
Status: DC | PRN
  Administered 2019-02-12 (×2): 1000 mL

## 2019-02-12 MED ORDER — POVIDONE-IODINE 7.5 % EX SOLN: Freq: Once | CUTANEOUS | Status: DC

## 2019-02-12 MED ORDER — OXYCODONE HCL 5 MG PO TABS
5.0000 mg | ORAL_TABLET | ORAL | Status: DC | PRN
  Administered 2019-02-13 (×3): 5 mg via ORAL
  Filled 2019-02-12 (×2): qty 1
  Filled 2019-02-12: qty 2
  Filled 2019-02-12: qty 1

## 2019-02-12 MED ORDER — HYDROMORPHONE HCL 1 MG/ML IJ SOLN: 0.5000 mg | INTRAMUSCULAR | Status: DC | PRN

## 2019-02-12 MED ORDER — CEFUROXIME SODIUM 1.5 G IV SOLR
INTRAVENOUS | Status: DC | PRN
  Administered 2019-02-12: 1.5 g via INTRAVENOUS

## 2019-02-12 MED ORDER — CEFUROXIME SODIUM 1.5 G IV SOLR
INTRAVENOUS | Status: AC
  Filled 2019-02-12: qty 1.5

## 2019-02-12 MED ORDER — GABAPENTIN 300 MG PO CAPS
300.0000 mg | ORAL_CAPSULE | Freq: Every day | ORAL | Status: DC
  Administered 2019-02-12: 300 mg via ORAL
  Filled 2019-02-12: qty 1

## 2019-02-12 MED ORDER — FENTANYL CITRATE (PF) 100 MCG/2ML IJ SOLN
INTRAMUSCULAR | Status: AC
  Administered 2019-02-12: 25 ug via INTRAVENOUS
  Filled 2019-02-12: qty 4

## 2019-02-12 MED ORDER — DEXAMETHASONE SODIUM PHOSPHATE 10 MG/ML IJ SOLN
8.0000 mg | Freq: Once | INTRAMUSCULAR | Status: AC
  Administered 2019-02-12: 8 mg via INTRAVENOUS

## 2019-02-12 MED ORDER — ACETAMINOPHEN 500 MG PO TABS
1000.0000 mg | ORAL_TABLET | Freq: Four times a day (QID) | ORAL | Status: AC
  Administered 2019-02-12 – 2019-02-13 (×4): 1000 mg via ORAL
  Filled 2019-02-12 (×3): qty 2

## 2019-02-12 MED ORDER — POTASSIUM CHLORIDE IN NACL 20-0.9 MEQ/L-% IV SOLN
INTRAVENOUS | Status: DC
  Administered 2019-02-12: 13:00:00 via INTRAVENOUS
  Filled 2019-02-12 (×2): qty 1000

## 2019-02-12 MED ORDER — WATER FOR IRRIGATION, STERILE IR SOLN
Status: DC | PRN
  Administered 2019-02-12: 2000 mL

## 2019-02-12 SURGICAL SUPPLY — 70 items
ATTUNE PS FEM RT SZ 7 CEM KNEE (Femur) ×3 IMPLANT
ATTUNE PSRP INSR SZ7 6 KNEE (Insert) ×2 IMPLANT
ATTUNE PSRP INSR SZ7 6MM KNEE (Insert) ×1 IMPLANT
BAG ZIPLOCK 12X15 (MISCELLANEOUS) ×3 IMPLANT
BANDAGE ACE 6X5 VEL STRL LF (GAUZE/BANDAGES/DRESSINGS) ×3 IMPLANT
BASE TIBIAL ROT PLAT SZ 7 KNEE (Knees) ×1 IMPLANT
BLADE OSCILLATING/SAGITTAL (BLADE) ×2
BLADE SAGITTAL 25.0X1.19X90 (BLADE) ×2 IMPLANT
BLADE SAGITTAL 25.0X1.19X90MM (BLADE) ×1
BLADE SAW SGTL 13X75X1.27 (BLADE) ×3 IMPLANT
BLADE SURG SZ10 CARB STEEL (BLADE) ×6 IMPLANT
BLADE SW THK.38XMED LNG THN (BLADE) ×1 IMPLANT
BNDG ELASTIC 6X10 VLCR STRL LF (GAUZE/BANDAGES/DRESSINGS) ×3 IMPLANT
BOWL SMART MIX CTS (DISPOSABLE) ×3 IMPLANT
CEMENT HV SMART SET (Cement) ×6 IMPLANT
CHLORAPREP W/TINT 26 (MISCELLANEOUS) ×6 IMPLANT
CLOSURE WOUND 1/2 X4 (GAUZE/BANDAGES/DRESSINGS) ×1
COVER SURGICAL LIGHT HANDLE (MISCELLANEOUS) ×3 IMPLANT
COVER WAND RF STERILE (DRAPES) IMPLANT
CUFF TOURN SGL QUICK 34 (TOURNIQUET CUFF) ×2
CUFF TRNQT CYL 34X4.125X (TOURNIQUET CUFF) ×1 IMPLANT
DECANTER SPIKE VIAL GLASS SM (MISCELLANEOUS) ×6 IMPLANT
DRAPE IMP U-DRAPE 54X76 (DRAPES) ×3 IMPLANT
DRAPE ORTHO SPLIT 77X108 STRL (DRAPES)
DRAPE SHEET LG 3/4 BI-LAMINATE (DRAPES) ×3 IMPLANT
DRAPE SURG ORHT 6 SPLT 77X108 (DRAPES) IMPLANT
DRAPE U-SHAPE 47X51 STRL (DRAPES) ×3 IMPLANT
DRESSING AQUACEL AG SP 3.5X10 (GAUZE/BANDAGES/DRESSINGS) ×1 IMPLANT
DRSG AQUACEL AG ADV 3.5X10 (GAUZE/BANDAGES/DRESSINGS) ×3 IMPLANT
DRSG AQUACEL AG SP 3.5X10 (GAUZE/BANDAGES/DRESSINGS) ×3
DRSG MEPILEX BORDER 4X4 (GAUZE/BANDAGES/DRESSINGS) ×3 IMPLANT
ELECT REM PT RETURN 15FT ADLT (MISCELLANEOUS) ×3 IMPLANT
GLOVE BIO SURGEON STRL SZ7 (GLOVE) ×3 IMPLANT
GLOVE BIOGEL PI IND STRL 7.0 (GLOVE) ×1 IMPLANT
GLOVE BIOGEL PI IND STRL 7.5 (GLOVE) ×1 IMPLANT
GLOVE BIOGEL PI INDICATOR 7.0 (GLOVE) ×2
GLOVE BIOGEL PI INDICATOR 7.5 (GLOVE) ×2
GLOVE SS BIOGEL STRL SZ 7.5 (GLOVE) ×1 IMPLANT
GLOVE SUPERSENSE BIOGEL SZ 7.5 (GLOVE) ×2
GOWN STRL REUS W/ TWL LRG LVL3 (GOWN DISPOSABLE) ×1 IMPLANT
GOWN STRL REUS W/ TWL XL LVL3 (GOWN DISPOSABLE) ×1 IMPLANT
GOWN STRL REUS W/TWL LRG LVL3 (GOWN DISPOSABLE) ×2
GOWN STRL REUS W/TWL XL LVL3 (GOWN DISPOSABLE) ×2
HANDPIECE INTERPULSE COAX TIP (DISPOSABLE) ×2
HOLDER FOLEY CATH W/STRAP (MISCELLANEOUS) ×3 IMPLANT
HOOD PEEL AWAY FLYTE STAYCOOL (MISCELLANEOUS) ×9 IMPLANT
KIT TURNOVER KIT A (KITS) IMPLANT
MANIFOLD NEPTUNE II (INSTRUMENTS) ×3 IMPLANT
MARKER SKIN DUAL TIP RULER LAB (MISCELLANEOUS) ×3 IMPLANT
NEEDLE HYPO 22GX1.5 SAFETY (NEEDLE) ×3 IMPLANT
NS IRRIG 1000ML POUR BTL (IV SOLUTION) ×3 IMPLANT
PACK TOTAL KNEE CUSTOM (KITS) ×3 IMPLANT
PATELLA MEDIAL ATTUN 35MM KNEE (Knees) ×3 IMPLANT
PIN STEINMAN FIXATION KNEE (PIN) ×3 IMPLANT
PIN THREADED HEADED SIGMA (PIN) ×3 IMPLANT
PROTECTOR NERVE ULNAR (MISCELLANEOUS) ×6 IMPLANT
RESTRAINT HEAD UNIVERSAL NS (MISCELLANEOUS) ×3 IMPLANT
SET HNDPC FAN SPRY TIP SCT (DISPOSABLE) ×1 IMPLANT
STAPLER VISISTAT 35W (STAPLE) IMPLANT
STRIP CLOSURE SKIN 1/2X4 (GAUZE/BANDAGES/DRESSINGS) ×2 IMPLANT
SUT MNCRL AB 3-0 PS2 18 (SUTURE) ×3 IMPLANT
SUT VIC AB 0 CT1 36 (SUTURE) ×6 IMPLANT
SUT VIC AB 1 CT1 36 (SUTURE) ×3 IMPLANT
SUT VIC AB 2-0 CT1 27 (SUTURE) ×4
SUT VIC AB 2-0 CT1 TAPERPNT 27 (SUTURE) ×2 IMPLANT
SYR CONTROL 10ML LL (SYRINGE) ×9 IMPLANT
TIBIAL BASE ROT PLAT SZ 7 KNEE (Knees) ×3 IMPLANT
TRAY FOLEY BAG SILVER LF 14FR (CATHETERS) ×3 IMPLANT
TRAY FOLEY MTR SLVR 14FR STAT (SET/KITS/TRAYS/PACK) ×3 IMPLANT
WATER STERILE IRR 1000ML POUR (IV SOLUTION) ×3 IMPLANT

## 2019-02-12 NOTE — Op Note (Signed)
MRN:     [id number] DOB/AGE:    March 20, 1946 / 73 y.o.       OPERATIVE REPORT   DATE OF PROCEDURE:  02/12/2019      PREOPERATIVE DIAGNOSIS:   Primary Localized Osteoarthritis right Knee       Estimated body mass index is 27.9 kg/m as calculated from the following:   Height as of this encounter: 5\' 8"  (1.727 m).   Weight as of this encounter: 83.2 kg.                                                       POSTOPERATIVE DIAGNOSIS:   Same                                                                 PROCEDURE:  Procedure(s): TOTAL KNEE ARTHROPLASTY Using Depuy Attune RP implants #7 Femur, #7Tibia, 55mm  RP bearing, 35 Patella    SURGEON: Laxmi Choung A. Thurston Hole, MD   ASSISTANT: Julien Girt, PA-C, present and scrubbed throughout the case, critical for retraction, instrumentation, and closure.  ANESTHESIA: GET with Adductor Nerve Block  TOURNIQUET TIME: 53 minutes   COMPLICATIONS:  None       SPECIMENS: None   INDICATIONS FOR PROCEDURE: The patient has djd of the knee with varus deformities, XR shows bone on bone arthritis. Patient has failed all conservative measures including anti-inflammatory medicines, narcotics, attempts at exercise and weight loss, cortisone injections and viscosupplementation.  Risks and benefits of surgery have been discussed, questions answered.    DESCRIPTION OF PROCEDURE: The patient identified by armband, received right adductor canal block and IV antibiotics, in the holding area at [facility]. Patient taken to the operating room, appropriate anesthetic monitors were attached. Spinal anesthesia induced with the patient in supine position, Foley catheter was inserted. Tourniquet applied high to the operative thigh. Lateral post and foot positioner applied to the table, the lower extremity was then prepped and draped in usual sterile fashion from the ankle to the tourniquet. Time-out procedure was performed. The limb was wrapped with an Esmarch bandage and  the tourniquet inflated to 365 mmHg.   We began the operation by making a 6cm anterior midline incision. Small bleeders in the skin and the subcutaneous tissue identified and cauterized. Transverse retinaculum was incised and reflected medially and a medial parapatellar arthrotomy was accomplished. the patella was everted and theprepatellar fat pad resected. The superficial medial collateral ligament was then elevated from anterior to posterior along the proximal flare of the tibia and anterior half of the menisci resected. The knee was hyperflexed exposing bone on bone arthritis. Peripheral and notch osteophytes as well as the cruciate ligaments were then resected. We continued to work our way around posteriorly along the proximal tibia, and externally rotated the tibia subluxing it out from underneath the femur. A McHale retractor was placed through the notch and a lateral Hohmann retractor placed, and an external tibial guide was placed.  The tibial cutting guide was pinned into place allowing resection of 4 mm of bone medially and about 6 mm of bone laterally because of her  varus deformity.   Satisfied with the tibial resection, we then entered the distal femur 2 mm anterior to the PCL origin with the intramedullary guide rod and applied the distal femoral cutting guide set at 11mm, with 5 degrees of valgus. This was pinned along the epicondylar axis. At this point, the distal femoral cut was accomplished without difficulty. We then sized for a 7 femoral component and pinned the guide in 3 degrees of external rotation.The chamfer cutting guide was pinned into place. The anterior, posterior, and chamfer cuts were accomplished without difficulty followed by the  RP box cutting guide and the box cut. We also removed posterior osteophytes from the posterior femoral condyles. At this time, the knee was brought into full extension. We checked our extension and flexion gaps and found them symmetric at 6.  The  patella thickness measured at 428m m. We set the cutting guide at 15 and removed the posterior patella sized for 35 button and drilled the lollipop. The knee was then once again hyperflexed exposing the proximal tibia. We sized for a # 7 tibial base plate, applied the smokestack and the conical reamer followed by the the Delta fin keel punch. We then hammered into place the  RP trial femoral component, inserted a trial bearing, trial patellar button, and took the knee through range of motion from 0-130 degrees. No thumb pressure was required for patellar tracking.   At this point, all trial components were removed, a double batch of DePuy HV cement with Zinacef 1.5gms was mixed and applied to all bony metallic mating surfaces. In order, we hammered into place the tibial tray and removed excess cement, the femoral component and removed excess cement, a 6 mm  RP bearing was inserted, and the knee brought to full extension with compression. The patellar button was clamped into place, and excess cement removed. While the cement cured the wound was irrigated out with normal saline solution pulse lavage, and exparel was injected throughout the knee. Ligament stability and patellar tracking were checked and found to be excellent..   The parapatellar arthrotomy was closed with  #1 Vicryl suture. The subcutaneous tissue with 0 and 2-0 undyed Vicryl suture, and 4-0 Monocryl.. A dressing of Aquaseal, 4 x 4, dressing sponges, Webril, and Ace wrap applied. Needle and sponge count were correct times 2.The patient awakened, extubated, and taken to recovery room without difficulty. Vascular status was normal, pulses 2+ and symmetric.    Nilda Simmerobert A Arlene Brickel 12/05/2017, 8:56 AM

## 2019-02-12 NOTE — Anesthesia Preprocedure Evaluation (Addendum)
Anesthesia Evaluation  Patient identified by MRN, date of birth, ID band Patient awake    Reviewed: Allergy & Precautions, NPO status , Patient's Chart, lab work & pertinent test results  History of Anesthesia Complications (+) PONV and history of anesthetic complications  Airway Mallampati: II  TM Distance: >3 FB Neck ROM: Full    Dental  (+) Teeth Intact, Dental Advisory Given, Implants   Pulmonary neg pulmonary ROS,    Pulmonary exam normal breath sounds clear to auscultation       Cardiovascular hypertension, Pt. on medications Normal cardiovascular exam Rhythm:Regular Rate:Normal     Neuro/Psych negative neurological ROS  negative psych ROS   GI/Hepatic negative GI ROS, Neg liver ROS,   Endo/Other  Hypothyroidism   Renal/GU negative Renal ROS     Musculoskeletal  (+) Arthritis , Osteoarthritis,  Fibromyalgia -  Abdominal   Peds  Hematology negative hematology ROS (+) Plt 277k   Anesthesia Other Findings Day of surgery medications reviewed with the patient.  Reproductive/Obstetrics                            Anesthesia Physical Anesthesia Plan  ASA: II  Anesthesia Plan: General   Post-op Pain Management:  Regional for Post-op pain   Induction: Intravenous  PONV Risk Score and Plan: 3 and Propofol infusion, Midazolam, Ondansetron and Dexamethasone  Airway Management Planned: Oral ETT  Additional Equipment:   Intra-op Plan:   Post-operative Plan: Extubation in OR  Informed Consent: I have reviewed the patients History and Physical, chart, labs and discussed the procedure including the risks, benefits and alternatives for the proposed anesthesia with the patient or authorized representative who has indicated his/her understanding and acceptance.     Dental advisory given  Plan Discussed with: CRNA, Anesthesiologist and Surgeon  Anesthesia Plan Comments:         Anesthesia Quick Evaluation

## 2019-02-12 NOTE — Transfer of Care (Signed)
Immediate Anesthesia Transfer of Care Note  Patient: [name]  Procedure(s) Performed: TOTAL KNEE ARTHROPLASTY (Right )  Patient Location: PACU  Anesthesia Type:General  Level of Consciousness: awake, alert  and oriented  Airway & Oxygen Therapy: Patient Spontanous Breathing and Patient connected to face mask oxygen  Post-op Assessment: Report given to RN and Post -op Vital signs reviewed and stable  Post vital signs: Reviewed and stable  Last Vitals:  Vitals Value Taken Time  BP 156/82 02/12/2019  9:17 AM  Temp    Pulse 92 02/12/2019  9:18 AM  Resp 18 02/12/2019  9:18 AM  SpO2 100 % 02/12/2019  9:18 AM  Vitals shown include unvalidated device data.  Last Pain:  Vitals:   02/12/19 0556  TempSrc:   PainSc: 0-No pain         Complications: No apparent anesthesia complications

## 2019-02-12 NOTE — Anesthesia Procedure Notes (Signed)
Anesthesia Regional Block: Adductor canal block   Pre-Anesthetic Checklist: ,, timeout performed, Correct Patient, Correct Site, Correct Laterality, Correct Procedure, Correct Position, site marked, Risks and benefits discussed,  Surgical consent,  Pre-op evaluation,  At surgeon's request and post-op pain management  Laterality: Right  Prep: chloraprep       Needles:  Injection technique: Single-shot  Needle Type: Echogenic Needle     Needle Length: 9cm  Needle Gauge: 21     Additional Needles:   Procedures:,,,, ultrasound used (permanent image in chart),,,,  Narrative:  Start time: 02/12/2019 6:58 AM End time: 02/12/2019 7:03 AM Injection made incrementally with aspirations every 5 mL.  Performed by: Personally  Anesthesiologist: Cecile Hearing, MD  Additional Notes: No pain on injection. No increased resistance to injection. Injection made in 5cc increments.  Good needle visualization.  Patient tolerated procedure well.

## 2019-02-12 NOTE — Anesthesia Procedure Notes (Signed)
Procedure Name: Intubation Date/Time: 02/12/2019 7:19 AM Performed by: Eben Burow, CRNA Pre-anesthesia Checklist: Patient identified, Emergency Drugs available, Suction available, Patient being monitored and Timeout performed Patient Re-evaluated:Patient Re-evaluated prior to induction Oxygen Delivery Method: Circle system utilized Preoxygenation: Pre-oxygenation with 100% oxygen Induction Type: IV induction and Rapid sequence Laryngoscope Size: Mac and 4 Grade View: Grade I Tube type: Oral Tube size: 7.0 mm Number of attempts: 1 Airway Equipment and Method: Stylet Placement Confirmation: ETT inserted through vocal cords under direct vision,  positive ETCO2 and breath sounds checked- equal and bilateral Secured at: 21 cm Tube secured with: Tape Dental Injury: Teeth and Oropharynx as per pre-operative assessment

## 2019-02-12 NOTE — Progress Notes (Signed)
Pt was started on an antibiotic Saturday May 30 for a UTI.  It was BID. Pt does not know the name of med.  Pt did take a dose this morning 02/12/19 at 0415.

## 2019-02-12 NOTE — Anesthesia Postprocedure Evaluation (Signed)
Anesthesia Post Note  Patient: [name]  Procedure(s) Performed: TOTAL KNEE ARTHROPLASTY (Right )     Patient location during evaluation: PACU Anesthesia Type: General Level of consciousness: awake and alert and awake Pain management: pain level controlled Vital Signs Assessment: post-procedure vital signs reviewed and stable Respiratory status: spontaneous breathing, nonlabored ventilation, respiratory function stable and patient connected to nasal cannula oxygen Cardiovascular status: blood pressure returned to baseline and stable Postop Assessment: no apparent nausea or vomiting Anesthetic complications: no    Last Vitals:  Vitals:   02/12/19 1238 02/12/19 1333  BP: 123/68 138/69  Pulse: 82 80  Resp: 15 15  Temp: 36.4 C 36.7 C  SpO2: 100% 100%    Last Pain:  Vitals:   02/12/19 1333  TempSrc: Oral  PainSc:                  Cecile Hearing

## 2019-02-12 NOTE — Evaluation (Signed)
Physical Therapy Evaluation Patient Details Name: [name] MRN: [id number] DOB: Jun 05, 1946 Today's Date: 02/12/2019   History of Present Illness  R TKA  Clinical Impression  Pt is s/p TKA resulting in the deficits listed below (see PT Problem List). Pt ambulated 75' with RW with min/guard assist. Initiated TKA HEP. Good progress expected.  Pt will benefit from skilled PT to increase their independence and safety with mobility to allow discharge to the venue listed below.      Follow Up Recommendations Follow surgeon's recommendation for DC plan and follow-up therapies(pt reports plan is HHPT then OPPT)    Equipment Recommendations  None recommended by PT    Recommendations for Other Services       Precautions / Restrictions Precautions Precautions: Knee Precaution Booklet Issued: Yes (comment) Precaution Comments: reviewed no pillow under knee Restrictions Weight Bearing Restrictions: No Other Position/Activity Restrictions: WBAT      Mobility  Bed Mobility Overal bed mobility: Modified Independent             General bed mobility comments: HOB up  Transfers Overall transfer level: Needs assistance Equipment used: Rolling walker (2 wheeled) Transfers: Sit to/from Stand Sit to Stand: Min assist         General transfer comment: assist to rise/steady, VCs hand placement  Ambulation/Gait Ambulation/Gait assistance: Min guard Gait Distance (Feet): 55 Feet Assistive device: Rolling walker (2 wheeled) Gait Pattern/deviations: Step-to pattern;Decreased weight shift to right;Decreased stride length Gait velocity: decr   General Gait Details: VCs sequencing, no loss of balance  Stairs            Wheelchair Mobility    Modified Rankin (Stroke Patients Only)       Balance Overall balance assessment: Modified Independent                                           Pertinent Vitals/Pain Pain Assessment: 0-10 Pain Score: 2   Pain Location: R knee Pain Descriptors / Indicators: Sore Pain Intervention(s): Limited activity within patient's tolerance;Monitored during session;Premedicated before session;Ice applied    Home Living Family/patient expects to be discharged to:: Private residence Living Arrangements: Spouse/significant other Available Help at Discharge: Family;Available 24 hours/day Type of Home: House Home Access: Level entry     Home Layout: One level Home Equipment: Walker - 2 wheels;Cane - single point;Cane - quad;Bedside commode      Prior Function Level of Independence: Independent         Comments: 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 [employer], 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, 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     Higher education careers adviser        Extremity/Trunk Assessment   Upper Extremity Assessment Upper Extremity Assessment: Overall WFL for tasks assessed    Lower Extremity Assessment Lower Extremity Assessment: RLE deficits/detail RLE Deficits / Details: R knee 5-55* AAROM, 3/5 SLR RLE Sensation: WNL    Cervical / Trunk Assessment Cervical / Trunk Assessment: Normal  Communication   Communication: No difficulties  Cognition Arousal/Alertness: Awake/alert Behavior During Therapy: WFL for tasks assessed/performed Overall Cognitive Status: Within Functional Limits for tasks assessed                                        General Comments  Exercises Total Joint Exercises Ankle Circles/Pumps: AROM;Both;10 reps;Supine Quad Sets: AROM;Right;5 reps;Supine Heel Slides: AAROM;Right;10 reps;Supine Long Arc Quad: AROM;Right;5 reps;Seated Goniometric ROM: 5-55* AAROM R knee   Assessment/Plan    PT Assessment Patient needs continued PT services  PT Problem List Decreased strength;Decreased range of motion;Decreased activity tolerance;Decreased mobility;Pain;Decreased knowledge of use of DME       PT Treatment Interventions DME instruction;Gait training;Therapeutic  activities;Therapeutic exercise;Patient/family education    PT Goals (Current goals can be found in the Care Plan section)  Acute Rehab PT Goals Patient Stated Goal: baking cakes, 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, wheelchair or bedside chair)?: A Little Help needed to walk in hospital room?: A Little Help needed climbing 3-5 steps with a railing? : A Lot 6 Click Score: 17    End of Session Equipment Utilized During Treatment: Gait belt Activity Tolerance: Patient tolerated treatment well Patient left: in chair;with call bell/phone within reach;with chair alarm set Nurse Communication: Mobility status PT Visit Diagnosis: Muscle weakness (generalized) (M62.81);Difficulty in walking, not elsewhere classified (R26.2);Pain Pain - Right/Left: Right Pain - part of body: Knee    Time: [id number] PT Time Calculation (min) (ACUTE ONLY): 27 min   Charges:   PT Evaluation $PT Eval Low Complexity: 1 Low PT Treatments $Gait Training: 8-22 mins        Ralene BatheUhlenberg, Ednamae Schiano Kistler PT 02/12/2019  Acute Rehabilitation Services Pager [phone] Office [phone]

## 2019-02-12 NOTE — Interval H&P Note (Signed)
History and Physical Interval Note:  02/12/2019 6:25 AM  [name]  has presented today for surgery, with the diagnosis of djd right knee.  The various methods of treatment have been discussed with the patient and family. After consideration of risks, benefits and other options for treatment, the patient has consented to  Procedure(s): TOTAL KNEE ARTHROPLASTY (Right) as a surgical intervention.  The patient's history has been reviewed, patient examined, no change in status, stable for surgery.  I have reviewed the patient's chart and labs.  Questions were answered to the patient's satisfaction.     Nilda Simmer

## 2019-02-13 ENCOUNTER — Encounter (HOSPITAL_COMMUNITY): Payer: Self-pay | Admitting: Orthopedic Surgery

## 2019-02-13 DIAGNOSIS — M1711 Unilateral primary osteoarthritis, right knee: Secondary | ICD-10-CM | POA: Diagnosis not present

## 2019-02-13 LAB — URINE CULTURE: Culture: NO GROWTH

## 2019-02-13 LAB — CBC
HCT: 35.3 % — ABNORMAL LOW (ref 36.0–46.0)
Hemoglobin: 10.9 g/dL — ABNORMAL LOW (ref 12.0–15.0)
MCH: 27.1 pg (ref 26.0–34.0)
MCHC: 30.9 g/dL (ref 30.0–36.0)
MCV: 87.8 fL (ref 80.0–100.0)
Platelets: 248 10*3/uL (ref 150–400)
RBC: 4.02 MIL/uL (ref 3.87–5.11)
RDW: 16.1 % — ABNORMAL HIGH (ref 11.5–15.5)
WBC: 14.9 10*3/uL — ABNORMAL HIGH (ref 4.0–10.5)
nRBC: 0 % (ref 0.0–0.2)

## 2019-02-13 LAB — BASIC METABOLIC PANEL
Anion gap: 7 (ref 5–15)
BUN: 12 mg/dL (ref 8–23)
CO2: 22 mmol/L (ref 22–32)
Calcium: 8.5 mg/dL — ABNORMAL LOW (ref 8.9–10.3)
Chloride: 110 mmol/L (ref 98–111)
Creatinine, Ser: 0.57 mg/dL (ref 0.44–1.00)
GFR calc Af Amer: >60 mL/min (ref >60)
GFR calc non Af Amer: >60 mL/min (ref >60)
Glucose, Bld: 147 mg/dL — ABNORMAL HIGH (ref 70–99)
Potassium: 4 mmol/L (ref 3.5–5.1)
Sodium: 139 mmol/L (ref 135–145)

## 2019-02-13 MED ORDER — GABAPENTIN 300 MG PO CAPS: ORAL_CAPSULE | ORAL | 0 refills | Status: DC

## 2019-02-13 MED ORDER — OXYCODONE HCL 5 MG PO TABS: ORAL_TABLET | ORAL | 0 refills | Status: DC

## 2019-02-13 MED ORDER — POLYETHYLENE GLYCOL 3350 17 G PO PACK: PACK | ORAL | 0 refills | Status: DC

## 2019-02-13 MED ORDER — DOCUSATE SODIUM 100 MG PO CAPS: ORAL_CAPSULE | ORAL | 0 refills | Status: DC

## 2019-02-13 MED ORDER — ASPIRIN 325 MG PO TBEC: DELAYED_RELEASE_TABLET | ORAL | 0 refills | Status: DC

## 2019-02-13 NOTE — Progress Notes (Signed)
Physical Therapy Treatment Patient Details Name: [name] MRN: [id number] DOB: 01/13/1946 Today's Date: 02/13/2019    History of Present Illness 73 yo female s/p R TKA 02/12/19.     PT Comments    Progressing well with mobility. Reviewed exercises and gait training. All education completed. Okay to d/c from PT standpoint-made RN aware.   Follow Up Recommendations  Follow surgeon's recommendation for DC plan and follow-up therapies     Equipment Recommendations  None recommended by PT    Recommendations for Other Services       Precautions / Restrictions Precautions Precautions: Fall;Knee Precaution Comments: reviewed no pillow under knee Restrictions Weight Bearing Restrictions: No Other Position/Activity Restrictions: WBAT    Mobility  Bed Mobility               General bed mobility comments: oob in recliner  Transfers Overall transfer level: Needs assistance Equipment used: Rolling walker (2 wheeled) Transfers: Sit to/from Stand Sit to Stand: Supervision         General transfer comment: for safety.   Ambulation/Gait Ambulation/Gait assistance: Min guard Gait Distance (Feet): 160 Feet Assistive device: Rolling walker (2 wheeled) Gait Pattern/deviations: Step-through pattern;Decreased stride length     General Gait Details: close guard for safety. noted increaed knee flexion during stance phase of gait.    Stairs             Wheelchair Mobility    Modified Rankin (Stroke Patients Only)       Balance Overall balance assessment: Mild deficits observed, not formally tested                                          Cognition Arousal/Alertness: Awake/alert Behavior During Therapy: WFL for tasks assessed/performed Overall Cognitive Status: Within Functional Limits for tasks assessed                                        Exercises Total Joint Exercises Ankle Circles/Pumps: AROM;Both;10  reps;Seated Quad Sets: AROM;Both;10 reps;Seated Hip ABduction/ADduction: AROM;Right;10 reps;Seated Straight Leg Raises: AROM;Right;10 reps;Seated Knee Flexion: Right;10 reps;Seated;AROM Goniometric ROM: ~5-90 degrees    General Comments        Pertinent Vitals/Pain Pain Assessment: 0-10 Pain Score: 6  Pain Location: R knee (after exercises) Pain Descriptors / Indicators: Sore;Aching Pain Intervention(s): Monitored during session;Repositioned;Ice applied    Home Living                      Prior Function            PT Goals (current goals can now be found in the care plan section) Progress towards PT goals: Progressing toward goals    Frequency    7X/week      PT Plan Current plan remains appropriate    Co-evaluation              AM-PAC PT "6 Clicks" Mobility   Outcome Measure  Help needed turning from your back to your side while in a flat bed without using bedrails?: A Little Help needed moving from lying on your back to sitting on the side of a flat bed without using bedrails?: A Little Help needed moving to and from a bed to a chair (including a wheelchair)?: A Little Help needed standing  up from a chair using your arms (e.g., wheelchair or bedside chair)?: A Little Help needed to walk in hospital room?: A Little Help needed climbing 3-5 steps with a railing? : A Little 6 Click Score: 18    End of Session Equipment Utilized During Treatment: Gait belt Activity Tolerance: Patient tolerated treatment well Patient left: in chair;with call bell/phone within reach   PT Visit Diagnosis: Other abnormalities of gait and mobility (R26.89);Pain Pain - Right/Left: Right Pain - part of body: Knee     Time: [id number] PT Time Calculation (min) (ACUTE ONLY): 11 min  Charges:  $Gait Training: 8-22 mins                        [name], PT Acute Rehabilitation Services Pager: [phone] Office: [phone]

## 2019-02-13 NOTE — Progress Notes (Signed)
Discharge paperwork discussed with pt at the bedside.  She demonstrated understanding. Pt escorted by wheelchair to main lobby in stable condition.

## 2019-02-13 NOTE — Plan of Care (Signed)
Plan of care reviewed and discussed with the patient. 

## 2019-02-13 NOTE — Discharge Summary (Signed)
Patient ID: Isabel SorrowSandra C Pegues MRN: [id number] DOB/AGE: 01/18/1946 73 y.o.  Admit date: 02/12/2019 Discharge date: 02/13/2019  Admission Diagnoses:  Principal Problem:   Primary localized osteoarthritis of right knee Active Problems:   HTN (hypertension)   Hyperlipidemia   Primary osteoarthritis of both knees   Fatigue   Fibromyalgia   PMR (polymyalgia rheumatica) (HCC)   History of repair of right rotator cuff   Primary osteoarthritis of both hands/ has had bilateral [facility] surgery    DDD (degenerative disc disease), lumbar/ history of lumbar surgery    Discharge Diagnoses:  Same  Past Medical History:  Diagnosis Date  . Bilateral ovarian cysts 09/12/2014   bilateral simple ovarian cysts.  US due in January 2018.  . 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 Fibromyalgia   . HTN (hypertension)    under control with meds., has been on med. x 20 yr.  . Hyperlipidemia   . Hypothyroid   . IBS (irritable bowel syndrome)   . Impingement syndrome of right shoulder 07/2013  . Polymyalgia rheumatica (HCC)    5-28  states she has been on prednisone and now is trying to only be on plaquenil to manage the polymyalgia   . PONV (postoperative nausea and vomiting)   . Primary localized osteoarthritis of right knee   . Rupture of right rotator cuff 07/2013  . VIN I (vulvar intraepithelial neoplasia I) 2017, 2018   this is from a perineal/vulvar biopsy.  . Vitamin B12 deficiency     Surgeries: Procedure(s): TOTAL KNEE ARTHROPLASTY on 02/12/2019   Consultants:   Discharged Condition: Improved  Hospital Course: [name] is an 73 y.o. female who was admitted 02/12/2019 for operative treatment ofPrimary localized osteoarthritis of right knee. Patient has severe unremitting pain that affects sleep, daily activities, and work/hobbies. After pre-op clearance the patient was taken to the operating room on 02/12/2019 and underwent  Procedure(s): TOTAL KNEE ARTHROPLASTY.     Patient was given perioperative antibiotics:  Anti-infectives (From admission, onward)   Start     Dose/Rate Route Frequency Ordered Stop   02/12/19 1330  ceFAZolin (ANCEF) IVPB 2g/100 mL premix     2 g 200 mL/hr over 30 Minutes Intravenous Every 6 hours 02/12/19 1150 02/12/19 1911   02/12/19 0750  cefUROXime (ZINACEF) injection  Status:  Discontinued       As needed 02/12/19 0751 02/12/19 0920   02/12/19 0600  ceFAZolin (ANCEF) IVPB 2g/100 mL premix     2 g 200 mL/hr over 30 Minutes Intravenous On call to O.R. 02/12/19 [id number] 02/12/19 0720       Patient was given sequential compression devices, early ambulation, and chemoprophylaxis to prevent DVT.  Patient benefited maximally from hospital stay and there were no complications.    Recent vital signs:  Patient Vitals for the past 24 hrs:  BP Temp Temp src Pulse Resp SpO2  02/13/19 0522 (!) 144/86 (!) 97.5 F (36.4 C) Oral 74 18 97 %  02/13/19 0130 137/84 98.2 F (36.8 C) Oral 72 18 96 %  02/12/19 2138 (!) 163/91 98.5 F (36.9 C) Oral 82 18 98 %  02/12/19 2050 - - - 76 16 -  02/12/19 1856 138/78 98.1 F (36.7 C) Oral 81 16 98 %  02/12/19 1420 - - - - - 100 %  02/12/19 1333 138/69 98 F (36.7 C) Oral 80 15 100 %  02/12/19 1238 123/68 97.6 F (36.4 C) Oral 82 15 100 %  02/12/19 1137 135/81 Marland Kitchen(!)  97.5 F (36.4 C) Oral 81 15 99 %  02/12/19 1028 138/81 (!) 97.5 F (36.4 C) Oral 83 - 98 %  02/12/19 1015 (!) 141/81 (!) 97.4 F (36.3 C) - 84 16 99 %  02/12/19 1000 (!) 143/74 - - 80 12 96 %  02/12/19 0945 (!) 141/73 - - 81 12 98 %  02/12/19 0930 (!) 153/84 - - 86 13 99 %  02/12/19 0917 (!) 156/82 (!) 97.4 F (36.3 C) - 93 18 100 %     Recent laboratory studies:  Recent Labs    02/13/19 0358  WBC 14.9*  HGB 10.9*  HCT 35.3*  PLT 248  NA 139  K 4.0  CL 110  CO2 22  BUN 12  CREATININE 0.57  GLUCOSE 147*  CALCIUM 8.5*     Discharge Medications:   Allergies as of 02/13/2019      Reactions   Penicillins Itching    OF MOUTH Did it involve swelling of the face/tongue/throat, SOB, or low BP? Unknown Did it involve sudden or severe rash/hives, skin peeling, or any reaction on the inside of your mouth or nose? Unknown Did you need to seek medical attention at a hospital or doctor's office? Unknown When did it last happen?in my 20s If all above answers are "NO", may proceed with cephalosporin use.   Sulfa Antibiotics Itching   OF MOUTH      Medication List    STOP taking these medications   alendronate 70 MG tablet Commonly known as:  FOSAMAX   OMEGA-3 FISH OIL PO   vitamin B-12 1000 MCG tablet Commonly known as:  CYANOCOBALAMIN     TAKE these medications   aspirin 325 MG EC tablet 1 tab a day for the next 30 days to prevent blood clots   benazepril 40 MG tablet Commonly known as:  LOTENSIN Take 40 mg by mouth daily.   CALCIUM 600 + D PO Take 1 tablet by mouth daily.   diclofenac sodium 1 % Gel Commonly known as:  VOLTAREN Apply 3 gm to 3 large joints up to 3 times a day.Dispense 3 tubes with 3 refills. What changed:    how much to take  how to take this  when to take this  reasons to take this   docusate sodium 100 MG capsule Commonly known as:  COLACE 1 tab 2 times a day while on narcotics.  STOOL SOFTENER   gabapentin 300 MG capsule Commonly known as:  NEURONTIN 1 capsule at bedtime for nerve pain   hydroxychloroquine 200 MG tablet Commonly known as:  PLAQUENIL TAKE 1 TABLET BY MOUTH TWICE A DAY What changed:  when to take this   levothyroxine 112 MCG tablet Commonly known as:  SYNTHROID Take 112 mcg by mouth daily before breakfast.   magnesium gluconate 500 MG tablet Commonly known as:  MAGONATE Take 500 mg by mouth daily.   multivitamin tablet Take 1 tablet by mouth daily.   OVER THE COUNTER MEDICATION Take 1 tablet by mouth daily. Focus Factor   oxyCODONE 5 MG immediate release tablet Commonly known as:  Oxy IR/ROXICODONE 1 po q 4 hrs prn pain    polyethylene glycol 17 g packet Commonly known as:  MIRALAX / GLYCOLAX 17grams in 6 ounces of something to drink twice a day until bowel movement.  LAXITIVE.  Restart if two days since last bowel movement   predniSONE 1 MG tablet Commonly known as:  DELTASONE TAKE 3 TABLETS (3 MG TOTAL) BY  MOUTH DAILY WITH BREAKFAST. What changed:  how much to take            Discharge Care Instructions  (From admission, onward)         Start     Ordered   02/13/19 0000  Change dressing    Comments:  DO NOT REMOVE BANDAGE OVER SURGICAL INCISION.  WASH WHOLE LEG INCLUDING OVER THE WATERPROOF BANDAGE WITH SOAP AND WATER EVERY DAY.   02/13/19 0845          Diagnostic Studies: No results found.  Disposition: Discharge disposition: 01-Home or Self Care       Discharge Instructions    CPM   Complete by:  As directed    Continuous passive motion machine (CPM):      Use the CPM from 0 to 90 for 6 hours per day.       You may break it up into 2 or 3 sessions per day.      Use CPM for 2 weeks or until you are told to stop.   Call MD / Call 911   Complete by:  As directed    If you experience chest pain or shortness of breath, CALL 911 and be transported to the hospital emergency room.  If you develope a fever above 101 F, pus (white drainage) or increased drainage or redness at the wound, or calf pain, call your surgeon's office.   Change dressing   Complete by:  As directed    DO NOT REMOVE BANDAGE OVER SURGICAL INCISION.  WASH WHOLE LEG INCLUDING OVER THE WATERPROOF BANDAGE WITH SOAP AND WATER EVERY DAY.   Constipation Prevention   Complete by:  As directed    Drink plenty of fluids.  Prune juice may be helpful.  You may use a stool softener, such as Colace (over the counter) 100 mg twice a day.  Use MiraLax (over the counter) for constipation as needed.   Diet - low sodium heart healthy   Complete by:  As directed    Discharge instructions   Complete by:  As directed     INSTRUCTIONS AFTER JOINT REPLACEMENT   Remove items at home which could result in a fall. This includes throw rugs or furniture in walking pathways ICE to the affected joint every three hours while awake for 30 minutes at a time, for at least the first 3-5 days, and then as needed for pain and swelling.  Continue to use ice for pain and swelling. You may notice swelling that will progress down to the foot and ankle.  This is normal after surgery.  Elevate your leg when you are not up walking on it.   Continue to use the breathing machine you got in the hospital (incentive spirometer) which will help keep your temperature down.  It is common for your temperature to cycle up and down following surgery, especially at night when you are not up moving around and exerting yourself.  The breathing machine keeps your lungs expanded and your temperature down.   DIET:  As you were doing prior to hospitalization, we recommend a well-balanced diet.  DRESSING / WOUND CARE / SHOWERING  Keep the surgical dressing until follow up.  The dressing is water proof, so you can shower without any extra covering.  IF THE DRESSING FALLS OFF or the wound gets wet inside, change the dressing with sterile gauze.  Please use good hand washing techniques before changing the dressing.  Do not use any  lotions or creams on the incision until instructed by your surgeon.    ACTIVITY  Increase activity slowly as tolerated, but follow the weight bearing instructions below.   No driving for 6 weeks or until further direction given by your physician.  You cannot drive while taking narcotics.  No lifting or carrying greater than 10 lbs. until further directed by your surgeon. Avoid periods of inactivity such as sitting longer than an hour when not asleep. This helps prevent blood clots.  You may return to work once you are authorized by your doctor.     WEIGHT BEARING   Weight bearing as tolerated with assist device (walker, cane,  etc) as directed, use it as long as suggested by your surgeon or therapist, typically at least 2-3 weeks.   EXERCISES  Results after joint replacement surgery are often greatly improved when you follow the exercise, range of motion and muscle strengthening exercises prescribed by your doctor. Safety measures are also important to protect the joint from further injury. Any time any of these exercises cause you to have increased pain or swelling, decrease what you are doing until you are comfortable again and then slowly increase them. If you have problems or questions, call your caregiver or physical therapist for advice.   Rehabilitation is important following a joint replacement. After just a few days of immobilization, the muscles of the leg can become weakened and shrink (atrophy).  These exercises are designed to build up the tone and strength of the thigh and leg muscles and to improve motion. Often times heat used for twenty to thirty minutes before working out will loosen up your tissues and help with improving the range of motion but do not use heat for the first two weeks following surgery (sometimes heat can increase post-operative swelling).   These exercises can be done on a training (exercise) mat, on the floor, on a table or on a bed. Use whatever works the best and is most comfortable for you.    Use music or television while you are exercising so that the exercises are a pleasant break in your day. This will make your life better with the exercises acting as a break in your routine that you can look forward to.   Perform all exercises about fifteen times, three times per day or as directed.  You should exercise both the operative leg and the other leg as well.   Exercises include:  Quad Sets - Tighten up the muscle on the front of the thigh (Quad) and hold for 5-10 seconds.   Straight Leg Raises - With your knee straight (if you were given a brace, keep it on), lift the leg to 60  degrees, hold for 3 seconds, and slowly lower the leg.  Perform this exercise against resistance later as your leg gets stronger.  Leg Slides: Lying on your back, slowly slide your foot toward your buttocks, bending your knee up off the floor (only go as far as is comfortable). Then slowly slide your foot back down until your leg is flat on the floor again.  Angel Wings: Lying on your back spread your legs to the side as far apart as you can without causing discomfort.  Hamstring Strength:  Lying on your back, push your heel against the floor with your leg straight by tightening up the muscles of your buttocks.  Repeat, but this time bend your knee to a comfortable angle, and push your heel against the floor.  You  may put a pillow under the heel to make it more comfortable if necessary.   A rehabilitation program following joint replacement surgery can speed recovery and prevent re-injury in the future due to weakened muscles. Contact your doctor or a physical therapist for more information on knee rehabilitation.    CONSTIPATION  Constipation is defined medically as fewer than three stools per week and severe constipation as less than one stool per week.  Even if you have a regular bowel pattern at home, your normal regimen is likely to be disrupted due to multiple reasons following surgery.  Combination of anesthesia, postoperative narcotics, change in appetite and fluid intake all can affect your bowels.   YOU MUST use at least one of the following options; they are listed in order of increasing strength to get the job done.  They are all available over the counter, and you may need to use some, POSSIBLY even all of these options:    Drink plenty of fluids (prune juice may be helpful) and high fiber foods Colace 100 mg by mouth twice a day  Senokot for constipation as directed and as needed Dulcolax (bisacodyl), take with full glass of water  Miralax (polyethylene glycol) once or twice a day as  needed.  If you have tried all these things and are unable to have a bowel movement in the first 3-4 days after surgery call either your surgeon or your primary doctor.    If you experience loose stools or diarrhea, hold the medications until you stool forms back up.  If your symptoms do not get better within 1 week or if they get worse, check with your doctor.  If you experience "the worst abdominal pain ever" or develop nausea or vomiting, please contact the office immediately for further recommendations for treatment.   ITCHING:  If you experience itching with your medications, try taking only a single pain pill, or even half a pain pill at a time.  You can also use Benadryl over the counter for itching or also to help with sleep.   TED HOSE STOCKINGS:  Use stockings on both legs until for at least 2 weeks or as directed by physician office. They may be removed at night for sleeping.  MEDICATIONS:  See your medication summary on the "After Visit Summary" that nursing will review with you.  You may have some home medications which will be placed on hold until you complete the course of blood thinner medication.  It is important for you to complete the blood thinner medication as prescribed.  PRECAUTIONS:  If you experience chest pain or shortness of breath - call 911 immediately for transfer to the hospital emergency department.   If you develop a fever greater that 101 F, purulent drainage from wound, increased redness or drainage from wound, foul odor from the wound/dressing, or calf pain - CONTACT YOUR SURGEON.                                                   FOLLOW-UP APPOINTMENTS:  If you do not already have a post-op appointment, please call the office for an appointment to be seen by your surgeon.  Guidelines for how soon to be seen are listed in your "After Visit Summary", but are typically between 1-4 weeks after surgery.  OTHER INSTRUCTIONS:   Knee Replacement:  Do not place pillow  under knee, focus on keeping the knee straight while resting. CPM instructions: 0-90 degrees, 2 hours in the morning, 2 hours in the afternoon, and 2 hours in the evening. Place foam block, curve side up under heel at all times except when in CPM or when walking.  DO NOT modify, tear, cut, or change the foam block in any way.  MAKE SURE YOU:  Understand these instructions.  Get help right away if you are not doing well or get worse.    Thank you for letting us be a part of your medical care team.  It is a privilege we respect greatly.  We hope these instructions will help you stay on track for a fast and full recovery!   Do not put a pillow under the knee. Place it under the heel.   Complete by:  As directed    Place gray foam block, curve side up under heel at all times except when in CPM or when walking.  DO NOT modify, tear, cut, or change in any way the gray foam block.   Increase activity slowly as tolerated   Complete by:  As directed    Patient may shower   Complete by:  As directed    Aquacel dressing is water proof    Wash over it and the whole leg with soap and water at the end of your shower   TED hose   Complete by:  As directed    Use stockings (TED hose) for 2 weeks on both leg(s).  You may remove them at night for sleeping.      Follow-up Information    Salvatore Marvel, MD On 02/26/2019.   Specialty:  Orthopedic Surgery Why:   Arrive at 1:45 for 2 pm appointment with Zella Ball in physical therapy and Dr Thurston Hole after physical therapy appointment Contact information: [address] [place] [address] [phone]            Signed: Pascal Lux 02/13/2019, 8:46 AM

## 2019-02-13 NOTE — Care Management Important Message (Signed)
Important Message  Patient Details  Name: [name] MRN: [id number] Date of Birth: [birth year]   Medicare Important Message Given:       Golda Acre, RN 02/13/2019, 9:25 AM

## 2019-02-13 NOTE — Plan of Care (Signed)
  Problem: Health Behavior/Discharge Planning: Goal: Ability to manage health-related needs will improve Outcome: Adequate for Discharge   Problem: Clinical Measurements: Goal: Ability to maintain clinical measurements within normal limits will improve Outcome: Adequate for Discharge Goal: Will remain free from infection Outcome: Adequate for Discharge Goal: Diagnostic test results will improve Outcome: Adequate for Discharge   Problem: Activity: Goal: Risk for activity intolerance will decrease Outcome: Adequate for Discharge   Problem: Elimination: Goal: Will not experience complications related to bowel motility Outcome: Adequate for Discharge   Problem: Pain Managment: Goal: General experience of comfort will improve Outcome: Adequate for Discharge   Problem: Safety: Goal: Ability to remain free from injury will improve Outcome: Adequate for Discharge   Problem: Skin Integrity: Goal: Risk for impaired skin integrity will decrease Outcome: Adequate for Discharge   Problem: Education: Goal: Knowledge of the prescribed therapeutic regimen will improve Outcome: Adequate for Discharge Goal: Individualized Educational Video(s) Outcome: Adequate for Discharge   Problem: Activity: Goal: Ability to avoid complications of mobility impairment will improve Outcome: Adequate for Discharge   Problem: Clinical Measurements: Goal: Postoperative complications will be avoided or minimized Outcome: Adequate for Discharge   Problem: Skin Integrity: Goal: Will show signs of wound healing Outcome: Adequate for Discharge

## 2019-02-19 ENCOUNTER — Telehealth: Payer: Self-pay | Admitting: Rheumatology

## 2019-02-19 DIAGNOSIS — M353 Polymyalgia rheumatica: Secondary | ICD-10-CM

## 2019-02-19 MED ORDER — HYDROXYCHLOROQUINE SULFATE 200 MG PO TABS: 200.0000 mg | ORAL_TABLET | Freq: Every day | ORAL | 0 refills | Status: DC

## 2019-02-19 NOTE — Telephone Encounter (Signed)
Last Visit: 10/03/18 Next Visit: 04/03/19 Labs: 02/08/19 RDW 16.1 Glucose 102 Total Bilirubin 0.1  PLQ Eye Exam: 12/08/17 WNL   Patient reminded she is due to update her PLQ eye exam. Patient will schedule her appointment.   Okay to refill per Dr. Estanislado Pandy

## 2019-02-19 NOTE — Telephone Encounter (Signed)
Patient called requesting prescription refill of Plaquenil to be sent to [facility] specialty pharmacy.  Patient states it is less expensive than going to her local pharmacy.

## 2019-02-20 ENCOUNTER — Other Ambulatory Visit: Payer: Self-pay | Admitting: Rheumatology

## 2019-02-20 DIAGNOSIS — M353 Polymyalgia rheumatica: Secondary | ICD-10-CM

## 2019-02-20 NOTE — Telephone Encounter (Signed)
Last Visit: 10/03/2018 Next Visit: 04/03/2019 Labs: 02/08/2019 CBC, CMP. Elevated glucose, total bilirubin 0.1, RDW 16.1. Eye exam: 12/08/2017  Attempted to contact patient and left message on machine to advise patient we need updated PLQ eye exam.   Okay to refill per Dr. Estanislado Pandy.

## 2019-03-21 NOTE — Progress Notes (Deleted)
Office Visit Note  Patient: [name]             Date of Birth: 10-02-1945           MRN: [id number]             PCP: Leonard Downing, MD Referring: Leonard Downing, * Visit Date: 04/03/2019 Occupation: @GUAROCC @  Subjective:  No chief complaint on file.   History of Present Illness: [name] is a 73 y.o. female ***   Activities of Daily Living:  Patient reports morning stiffness for *** {minute/hour:19697}.   Patient {ACTIONS;DENIES/REPORTS:21021675::"Denies"} nocturnal pain.  Difficulty dressing/grooming: {ACTIONS;DENIES/REPORTS:21021675::"Denies"} Difficulty climbing stairs: {ACTIONS;DENIES/REPORTS:21021675::"Denies"} Difficulty getting out of chair: {ACTIONS;DENIES/REPORTS:21021675::"Denies"} Difficulty using hands for taps, buttons, cutlery, and/or writing: {ACTIONS;DENIES/REPORTS:21021675::"Denies"}  No Rheumatology ROS completed.   PMFS History:  Patient Active Problem List   Diagnosis Date Noted  . Primary localized osteoarthritis of right knee 10/19/2018  . Right foot pain 10/19/2018  . Incontinence of feces 09/21/2018  . Vulvar lesion 07/20/2018  . History of repair of right rotator cuff 03/03/2017  . Primary osteoarthritis of both hands/ has had bilateral [place] surgery  03/03/2017  . History of hypertension 03/03/2017  . History of hyperlipidemia 03/03/2017  . DDD (degenerative disc disease), lumbar/ history of lumbar surgery  03/03/2017  . Osteopenia of multiple sites 03/03/2017  . Chronic left shoulder pain 01/20/2017  . Primary osteoarthritis of both knees 01/20/2017  . Trapezius muscle spasm 01/20/2017  . Insomnia 01/20/2017  . Fatigue 01/20/2017  . Fibromyalgia 01/20/2017  . High risk medications (not anticoagulants) long-term use 01/20/2017  . PMR (polymyalgia rheumatica) (HCC) 01/20/2017  . Left ovarian cyst 06/02/2015  . Degenerative joint disease, shoulder, right 08/02/2013  . Precordial pain 02/11/2012  . HTN  (hypertension) 02/11/2012  . Hyperlipidemia 02/11/2012    Past Medical History:  Diagnosis Date  . Bilateral ovarian cysts 09/12/2014   bilateral simple ovarian cysts.  [place] due in January 2018.  . 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 Fibromyalgia   . HTN (hypertension)    under control with meds., has been on med. x 20 yr.  . Hyperlipidemia   . Hypothyroid   . IBS (irritable bowel syndrome)   . Impingement syndrome of right shoulder 07/2013  . Polymyalgia rheumatica (HCC)    5-28  states she has been on prednisone and now is trying to only be on plaquenil to manage the polymyalgia   . PONV (postoperative nausea and vomiting)   . Primary localized osteoarthritis of right knee   . Rupture of right rotator cuff 07/2013  . VIN I (vulvar intraepithelial neoplasia I) 2017, 2018   this is from a perineal/vulvar biopsy.  . Vitamin B12 deficiency     Family History  Problem Relation Age of Onset  . Aortic aneurysm Father   . Hypertension Father   . Coronary artery disease Mother 5  . Hypertension Mother   . Hyperlipidemia Brother        brain and kidney tumor, no treatment needed    Past Surgical History:  Procedure Laterality Date  . ANTERIOR LAT LUMBAR FUSION  10/08/2008   L3-4  . BACK SURGERY    . BACK SURGERY  10-03-14   --Dr. Glenna Fellows at [facility]  11/2009   polyps  . COLONOSCOPY W/ BIOPSIES  11/2012   polyp, recheck in 3 years  . CYSTO  07/05/2006  . CYSTOCELE REPAIR  07/05/2006  .  ESOPHAGOGASTRODUODENOSCOPY ENDOSCOPY  05/25/05   erythema and edema of gastric lumen  . EYE SURGERY Right    muscle adjustment   . FOOT SURGERY Right   . GANGLION CYST EXCISION Right    wrist  . INCONTINENCE SURGERY  07/05/2006   cystourethropexy  . ORIF FOREARM FRACTURE Right 09/2012  . SHOULDER ARTHROSCOPY WITH ROTATOR CUFF REPAIR AND SUBACROMIAL DECOMPRESSION Right 08/02/2013   Procedure: SHOULDER ARTHROSCOPY WITH ROTATOR CUFF  REPAIR AND SUBACROMIAL DECOMPRESSION, EXTENSIVE DEBRIDEMENT (INCLUDING LABRUM), PARTIAL ACROMIPLASY WITH CORACOACROMIAL RELEASE, DISTAL CLAVICULECTOMY;  Surgeon: Loreta Aveaniel F Murphy, MD;  Location: [facility];  Service: Orthopedics;  Laterality: Right;  . STRABISMUS SURGERY Right 01/26/2008   2 other surgeries previously  . STRABISMUS SURGERY Right 06/03/2017   Procedure: REPAIR STRABISMUS RIGHT EYE;  Surgeon: Verne CarrowYoung, William, MD;  Location: [facility];  Service: Ophthalmology;  Laterality: Right;  . Thumb surgery  2/13 & ?2010   Bilateral - states a joint was replaced  . TOTAL KNEE ARTHROPLASTY Right 02/12/2019   Procedure: TOTAL KNEE ARTHROPLASTY;  Surgeon: Salvatore MarvelWainer, Robert, MD;  Location: [facility];  Service: Orthopedics;  Laterality: Right;  . TOTAL VAGINAL HYSTERECTOMY  1986 - or age 73   secondary AUB  . TUBAL LIGATION     laparosopic BTL age 73  . varicose veins Bilateral    Social History   Social History Narrative   Lives at home with husband and granddaughter.     Immunization History  Administered Date(s) Administered  . Influenza-Unspecified 08/04/2017  . Pneumococcal Conjugate-13 05/14/2014, 05/15/2015  . Tdap 09/13/2012     Objective: Vital Signs: LMP 09/13/1984 (Approximate)    Physical Exam   Musculoskeletal Exam: ***  CDAI Exam: CDAI Score: - Patient Global: -; Provider Global: - Swollen: -; Tender: - Joint Exam   No joint exam has been documented for this visit   There is currently no information documented on the homunculus. Go to the Rheumatology activity and complete the homunculus joint exam.  Investigation: No additional findings.  Imaging: No results found.  Recent Labs: Lab Results  Component Value Date   WBC 14.9 (H) 02/13/2019   HGB 10.9 (L) 02/13/2019   PLT 248 02/13/2019   NA 139 02/13/2019   K 4.0 02/13/2019   CL 110 02/13/2019   CO2 22 02/13/2019   GLUCOSE 147 (H) 02/13/2019   BUN 12 02/13/2019   CREATININE  0.57 02/13/2019   BILITOT 0.1 (L) 02/08/2019   ALKPHOS 73 02/08/2019   AST 22 02/08/2019   ALT 17 02/08/2019   PROT 7.5 02/08/2019   ALBUMIN 4.1 02/08/2019   CALCIUM 8.5 (L) 02/13/2019   GFRAA >60 02/13/2019    Speciality Comments: PLQ Eye Exam: 12/08/17 WNL @ [facility] Follow up in 1 year  Procedures:  No procedures performed Allergies: Penicillins and Sulfa antibiotics   Assessment / Plan:     Visit Diagnoses: No diagnosis found.  Orders: No orders of the defined types were placed in this encounter.  No orders of the defined types were placed in this encounter.   Face-to-face time spent with patient was *** minutes. Greater than 50% of time was spent in counseling and coordination of care.  Follow-Up Instructions: No follow-ups on file.   Ellen HenriMarissa C Tully Burgo, CMA  Note - This record has been created using Animal nutritionistDragon software.  Chart creation errors have been sought, but may not always  have been located. Such creation errors do not reflect on  the standard of  medical care.

## 2019-03-21 NOTE — Progress Notes (Signed)
Office Visit Note  Patient: [name]             Date of Birth: 26-Nov-1945           MRN: [id number]             PCP: Kaleen Mask, MD Referring: Kaleen Mask, * Visit Date: 03/22/2019 Occupation: @  Subjective:  Trapezius muscle tension    History of Present Illness: ANJELITA SHEAHAN is a 73 y.o. female with history of PMR, fibromyalgia, and osteoarthritis.  She takes Plaquenil 200 mg 1 tablet by mouth twice daily and prednisone 2 mg by mouth daily.  She reports 5 weeks ago she had a right total knee replacement performed by Dr. Wyline Mood.  She finished physical therapy last week.  She denies any complications and is improving on a daily basis.  She has very minimal pain at this time.  She states that after surgery she did increase her dose of prednisone to 3 mg by mouth daily due to feeling like her PMR and fibromyalgia flaring.  Her muscle aches and muscle tenderness have improved.  She has no muscle weakness at this time.  She is having trapezius muscle tension and muscle tenderness bilaterally.  She has been experiencing neck stiffness and discomfort over the past several weeks.  She has tried ice, heat, Voltaren gel, and Aspercreme without relief.  She would like trigger point injections today.  She reports her level fatigue has been improving.  She recently ordered slow release iron tablets that she started taking.  She states she has been having some neuropathy in her feet but the gabapentin has been helping.   Activities of Daily Living:  Patient reports morning stiffness for 2-3 hours.   Patient Reports nocturnal pain.  Difficulty dressing/grooming: Denies Difficulty climbing stairs: Reports Difficulty getting out of chair: Reports Difficulty using hands for taps, buttons, cutlery, and/or writing: Denies  Review of Systems  Constitutional: Negative for fatigue and fever.  HENT: Negative for mouth sores, mouth dryness and nose dryness.   Eyes:  Negative for itching and dryness.  Respiratory: Negative for shortness of breath, wheezing and difficulty breathing.   Cardiovascular: Negative for chest pain, palpitations and swelling in legs/feet.  Gastrointestinal: Negative for abdominal pain, constipation and diarrhea.  Endocrine: Negative for increased urination.  Genitourinary: Negative for painful urination and pelvic pain.  Musculoskeletal: Positive for morning stiffness. Negative for arthralgias, joint pain and joint swelling.  Skin: Negative for rash and redness.  Allergic/Immunologic: Negative for susceptible to infections.  Neurological: Positive for headaches. Negative for dizziness, memory loss and weakness.  Hematological: Negative for bruising/bleeding tendency.  Psychiatric/Behavioral: Negative for confusion. The patient is not nervous/anxious.     PMFS History:  Patient Active Problem List   Diagnosis Date Noted  . Primary localized osteoarthritis of right knee 10/19/2018  . Right foot pain 10/19/2018  . Incontinence of feces 09/21/2018  . Vulvar lesion 07/20/2018  . History of repair of right rotator cuff 03/03/2017  . Primary osteoarthritis of both hands/ has had bilateral [facility] surgery  03/03/2017  . History of hypertension 03/03/2017  . History of hyperlipidemia 03/03/2017  . DDD (degenerative disc disease), lumbar/ history of lumbar surgery  03/03/2017  . Osteopenia of multiple sites 03/03/2017  . Chronic left shoulder pain 01/20/2017  . Primary osteoarthritis of both knees 01/20/2017  . Trapezius muscle spasm 01/20/2017  . Insomnia 01/20/2017  . Fatigue 01/20/2017  . Fibromyalgia 01/20/2017  . High risk medications (  not anticoagulants) long-term use 01/20/2017  . PMR (polymyalgia rheumatica) (HCC) 01/20/2017  . Left ovarian cyst 06/02/2015  . Degenerative joint disease, shoulder, right 08/02/2013  . Precordial pain 02/11/2012  . HTN (hypertension) 02/11/2012  . Hyperlipidemia 02/11/2012    Past Medical  History:  Diagnosis Date  . Bilateral ovarian cysts 09/12/2014   bilateral simple ovarian cysts.  US due in January 2018.  . 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 Fibromyalgia   . HTN (hypertension)    under control with meds., has been on med. x 20 yr.  . Hyperlipidemia   . Hypothyroid   . IBS (irritable bowel syndrome)   . Impingement syndrome of right shoulder 07/2013  . Polymyalgia rheumatica (HCC)    5-28  states she has been on prednisone and now is trying to only be on plaquenil to manage the polymyalgia   . PONV (postoperative nausea and vomiting)   . Primary localized osteoarthritis of right knee   . Rupture of right rotator cuff 07/2013  . VIN I (vulvar intraepithelial neoplasia I) 2017, 2018   this is from a perineal/vulvar biopsy.  . Vitamin B12 deficiency     Family History  Problem Relation Age of Onset  . Aortic aneurysm Father   . Hypertension Father   . Coronary artery disease Mother [id number]  . Hypertension Mother   . Hyperlipidemia Brother        brain and kidney tumor, no treatment needed    Past Surgical History:  Procedure Laterality Date  . ANTERIOR LAT LUMBAR FUSION  10/08/2008   L3-4  . BACK SURGERY    . BACK SURGERY  10-03-14   --[facility]  . COLONOSCOPY W/ BIOPSIES  11/2009   polyps  . COLONOSCOPY W/ BIOPSIES  11/2012   polyp, recheck in 3 years  . CYSTO  07/05/2006  . CYSTOCELE REPAIR  07/05/2006  . ESOPHAGOGASTRODUODENOSCOPY ENDOSCOPY  05/25/05   erythema and edema of gastric lumen  . EYE SURGERY Right    muscle adjustment   . FOOT SURGERY Right   . GANGLION CYST EXCISION Right    wrist  . INCONTINENCE SURGERY  07/05/2006   cystourethropexy  . ORIF FOREARM FRACTURE Right 09/2012  . SHOULDER ARTHROSCOPY WITH ROTATOR CUFF REPAIR AND SUBACROMIAL DECOMPRESSION Right 08/02/2013   Procedure: SHOULDER ARTHROSCOPY WITH ROTATOR CUFF REPAIR AND SUBACROMIAL DECOMPRESSION, EXTENSIVE DEBRIDEMENT (INCLUDING LABRUM),  PARTIAL ACROMIPLASY WITH CORACOACROMIAL RELEASE, DISTAL CLAVICULECTOMY;  Surgeon: Loreta Aveaniel F Murphy, MD;  Location: [facility];  Service: Orthopedics;  Laterality: Right;  . STRABISMUS SURGERY Right 01/26/2008   2 other surgeries previously  . STRABISMUS SURGERY Right 06/03/2017   Procedure: REPAIR STRABISMUS RIGHT EYE;  Surgeon: Verne CarrowYoung, William, MD;  Location: [facility];  Service: Ophthalmology;  Laterality: Right;  . Thumb surgery  2/13 & ?2010   Bilateral - states a joint was replaced  . TOTAL KNEE ARTHROPLASTY Right 02/12/2019   Procedure: TOTAL KNEE ARTHROPLASTY;  Surgeon: Salvatore MarvelWainer, Robert, MD;  Location: [facility];  Service: Orthopedics;  Laterality: Right;  . TOTAL VAGINAL HYSTERECTOMY  1986 - or age 73   secondary AUB  . TUBAL LIGATION     laparosopic BTL age 73  . varicose veins Bilateral    Social History   Social History Narrative   Lives at home with husband and granddaughter.     Immunization History  Administered Date(s) Administered  . Influenza-Unspecified 08/04/2017  . Pneumococcal Conjugate-13 05/14/2014, 05/15/2015  .  Tdap 09/13/2012     Objective: Vital Signs: BP 110/70 (BP Location: Left Arm, Patient Position: Sitting, Cuff Size: Normal)   Pulse 73   Resp 14   Ht 5\' 8"  (1.727 m)   Wt 182 lb 6.4 oz (82.7 kg)   LMP 09/13/1984 (Approximate)   BMI 27.73 kg/m    Physical Exam Vitals signs and nursing note reviewed.  Constitutional:      Appearance: She is well-developed.  HENT:     Head: Normocephalic and atraumatic.  Eyes:     Conjunctiva/sclera: Conjunctivae normal.  Neck:     Musculoskeletal: Normal range of motion.  Cardiovascular:     Rate and Rhythm: Normal rate and regular rhythm.     Heart sounds: Normal heart sounds.  Pulmonary:     Effort: Pulmonary effort is normal.     Breath sounds: Normal breath sounds.  Abdominal:     General: Bowel sounds are normal.     Palpations: Abdomen is soft.  Lymphadenopathy:      Cervical: No cervical adenopathy.  Skin:    General: Skin is warm and dry.     Capillary Refill: Capillary refill takes less than 2 seconds.  Neurological:     Mental Status: She is alert and oriented to person, place, and time.  Psychiatric:        Behavior: Behavior normal.      Musculoskeletal Exam: C-spine limited ROM with lateral rotation, especially to the right.  Trapezius muscle tension and tenderness bilaterally.  Thoracic and lumbar spine good ROM.  No midline spinal tenderness.  No SI joint tenderness.  Shoulder joints, elbow joints, wrist joints, MCPs, PIPs, and DIPs good ROM with no synovitis.  PIP and DIP synovial thickening.  Hip joints, knee joints, ankle joints, MTPs, PIPs, and DIPs good ROM with no synovitis. Right knee replacement warmth and slightly limited extension and flexion.  Left knee has full ROM with no warmth or effusion.  No tenderness or swelling of ankle joints.  Pedal edema noted.   CDAI Exam: CDAI Score: - Patient Global: -; Provider Global: - Swollen: -; Tender: - Joint Exam   No joint exam has been documented for this visit   There is currently no information documented on the homunculus. Go to the Rheumatology activity and complete the homunculus joint exam.  Investigation: No additional findings.  Imaging: No results found.  Recent Labs: Lab Results  Component Value Date   WBC 14.9 (H) 02/13/2019   HGB 10.9 (L) 02/13/2019   PLT 248 02/13/2019   NA 139 02/13/2019   K 4.0 02/13/2019   CL 110 02/13/2019   CO2 22 02/13/2019   GLUCOSE 147 (H) 02/13/2019   BUN 12 02/13/2019   CREATININE 0.57 02/13/2019   BILITOT 0.1 (L) 02/08/2019   ALKPHOS 73 02/08/2019   AST 22 02/08/2019   ALT 17 02/08/2019   PROT 7.5 02/08/2019   ALBUMIN 4.1 02/08/2019   CALCIUM 8.5 (L) 02/13/2019   GFRAA >60 02/13/2019    Speciality Comments: PLQ Eye Exam: 12/08/17 [facility]NL [facility] Follow up in 1 year  Procedures:  Trigger Point Inj  Date/Time: 03/22/2019  10:45 AM Performed by: Pollyann Savoyeveshwar, Shaili, MD Authorized by: Pollyann Savoyeveshwar, Shaili, MD   Consent Given by:  Patient Site marked: the procedure site was marked   Timeout: prior to procedure the correct patient, procedure, and site was verified   Indications:  Pain Total # of Trigger Points:  2 Location: neck   Needle Size:  27 G Approach:  Dorsal Medications #1:  0.5 mL lidocaine 1 %; 10 mg triamcinolone acetonide 40 MG/ML Medications #2:  0.5 mL lidocaine 1 %; 10 mg triamcinolone acetonide 40 MG/ML Patient tolerance:  Patient tolerated the procedure well with no immediate complications   Allergies: Penicillins and Sulfa antibiotics   Assessment / Plan:     Visit Diagnoses: PMR (polymyalgia rheumatica) ([place]) - She has not had any PMR flares.  She has no muscle aches or muscle tenderness at this time.  She has no muscle weakness.  She has no difficulty getting up from a chair raising her arms above her head.  She is currently on Plaquenil 200 mg 1 tablet twice daily and prednisone 2 mg by mouth daily. She does not need any refills at this time. She advised to notify us if she has any new or worsening symptoms.  She will follow-up in the office in 5 months.  High risk medications (not anticoagulants) long-term use - Plaquenil 200 mg 1 tablet twice daily and prednisone 1 mg 2 tablets daily.  Last Plaquenil eye exam normal on 12/08/2017.  Most recent CBC/BMP within normal limits except for low calcium, elevated WBC, and anemia on 02/13/2019.  Most recent CMP showed normal liver enzymes on 02/08/2019.  Due for CBC/CMP in August and will monitor every 3 months.  Standing orders placed.  She is received the Prevnar 13 vaccine.    - Plan: CBC with Differential/Platelet, COMPLETE METABOLIC PANEL WITH GFR  Primary osteoarthritis of both hands/ has had bilateral [facility] surgery  - She has PIP and DIP synovial thickening consistent with osteoarthritis of both hands.  She has no synovitis or tenderness on exam.  Joint  protection and muscle strengthening were discussed.    Trapezius muscle spasm - She presents today with trapezius muscle tension and muscle tenderness bilaterally.  She is been experiencing muscle spasms and stiffness in her neck for the past several weeks.  She has tried icing, using heat, applying Aspercreme, and applying Voltaren gel without much relief.  She would like trigger point injections today.  She tolerated procedure well.  The procedure note is completed above.  S/P TKR (total knee replacement), right - Dr. Rob Bunting 5 weeks ago-She is clinically doing well.  She has not had any complications.  She finished physical therapy last week.  She does have warmth and slightly limited range of motion with extension and flexion on exam.  Her pain is been very minimal.  She has not had any difficulties with ADLs.  Primary osteoarthritis of left knee - She has good range of motion with no discomfort.  No warmth or effusion noted.  Primary osteoarthritis of right shoulder - She has good range of motion with no discomfort at this time.  History of repair of right rotator cuff - Good range of motion with no discomfort.  DDD (degenerative disc disease), lumbar -She has intermittent lower back pain.   Fibromyalgia - She had a fibromyalgia flare after her right total knee replacement 5 weeks ago.  Her muscle aches and muscle tenderness have improved.  She is having trapezius muscle tension muscle tenderness bilaterally.  She has been experiencing muscle spasms intermittently.  She requested bilateral trigger point injections today. She tolerated the procedure well. She was encouraged to stay active and exercise regularly.  Other insomnia - She has been sleeping well at night recently.  Other fatigue - Her level fatigue has been improving.  She is started taking slow release iron  which has been improving her fatigue.  Osteopenia of multiple sites - T-1.3 left femur 09/29/17.  She is on calcium and  vitamin D.   History of hypertension - She was advised to monitor blood pressure closely upon the cortisone injections today.  History of hyperlipidemia  Orders: Orders Placed This Encounter  Procedures  . Trigger Point Inj  . CBC with Differential/Platelet  . COMPLETE METABOLIC PANEL WITH GFR   No orders of the defined types were placed in this encounter.  Face-to-face time spent with patient was 30 minutes. Greater than 50% of time was spent in counseling and coordination of care.  Follow-Up Instructions: Return in about 5 months (around 08/22/2019) for Polymyalgia Rheumatica, Osteoarthritis.   Gearldine Bienenstockaylor M Dale, PA-C   I examined and evaluated the patient with Sherron Alesaylor Dale PA. The plan of care was discussed as noted above.  Pollyann SavoyShaili Deveshwar, MD  Note - This record has been created using Animal nutritionistDragon software.  Chart creation errors have been sought, but may not always  have been located. Such creation errors do not reflect on  the standard of medical care.

## 2019-03-22 ENCOUNTER — Other Ambulatory Visit: Payer: Self-pay

## 2019-03-22 ENCOUNTER — Ambulatory Visit (INDEPENDENT_AMBULATORY_CARE_PROVIDER_SITE_OTHER): Payer: Medicare Other | Admitting: Physician Assistant

## 2019-03-22 ENCOUNTER — Encounter: Payer: Self-pay | Admitting: Physician Assistant

## 2019-03-22 VITALS — BP 110/70 | HR 73 | Resp 14 | Ht 68.0 in | Wt 182.4 lb

## 2019-03-22 DIAGNOSIS — G4709 Other insomnia: Secondary | ICD-10-CM

## 2019-03-22 DIAGNOSIS — M353 Polymyalgia rheumatica: Secondary | ICD-10-CM | POA: Diagnosis not present

## 2019-03-22 DIAGNOSIS — M62838 Other muscle spasm: Secondary | ICD-10-CM

## 2019-03-22 DIAGNOSIS — R5383 Other fatigue: Secondary | ICD-10-CM

## 2019-03-22 DIAGNOSIS — M797 Fibromyalgia: Secondary | ICD-10-CM

## 2019-03-22 DIAGNOSIS — Z96651 Presence of right artificial knee joint: Secondary | ICD-10-CM

## 2019-03-22 DIAGNOSIS — M1712 Unilateral primary osteoarthritis, left knee: Secondary | ICD-10-CM

## 2019-03-22 DIAGNOSIS — M19011 Primary osteoarthritis, right shoulder: Secondary | ICD-10-CM

## 2019-03-22 DIAGNOSIS — Z79899 Other long term (current) drug therapy: Secondary | ICD-10-CM

## 2019-03-22 DIAGNOSIS — M19041 Primary osteoarthritis, right hand: Secondary | ICD-10-CM

## 2019-03-22 DIAGNOSIS — M8589 Other specified disorders of bone density and structure, multiple sites: Secondary | ICD-10-CM

## 2019-03-22 DIAGNOSIS — Z8679 Personal history of other diseases of the circulatory system: Secondary | ICD-10-CM

## 2019-03-22 DIAGNOSIS — Z8639 Personal history of other endocrine, nutritional and metabolic disease: Secondary | ICD-10-CM

## 2019-03-22 DIAGNOSIS — Z9889 Other specified postprocedural states: Secondary | ICD-10-CM

## 2019-03-22 DIAGNOSIS — M5136 Other intervertebral disc degeneration, lumbar region: Secondary | ICD-10-CM

## 2019-03-22 DIAGNOSIS — M19042 Primary osteoarthritis, left hand: Secondary | ICD-10-CM

## 2019-03-22 MED ORDER — TRIAMCINOLONE ACETONIDE 40 MG/ML IJ SUSP
10.0000 mg | INTRAMUSCULAR | Status: AC | PRN
  Administered 2019-03-22: 10 mg via INTRAMUSCULAR

## 2019-03-22 MED ORDER — LIDOCAINE HCL 1 % IJ SOLN
0.5000 mL | INTRAMUSCULAR | Status: AC | PRN
  Administered 2019-03-22: .5 mL

## 2019-04-03 ENCOUNTER — Ambulatory Visit: Payer: Self-pay | Admitting: Rheumatology

## 2019-04-03 ENCOUNTER — Ambulatory Visit: Payer: Medicare Other | Admitting: Rheumatology

## 2019-04-03 ENCOUNTER — Other Ambulatory Visit: Payer: Self-pay | Admitting: Obstetrics and Gynecology

## 2019-04-03 DIAGNOSIS — Z1231 Encounter for screening mammogram for malignant neoplasm of breast: Secondary | ICD-10-CM

## 2019-04-09 ENCOUNTER — Other Ambulatory Visit: Payer: Self-pay | Admitting: Rheumatology

## 2019-04-09 DIAGNOSIS — M353 Polymyalgia rheumatica: Secondary | ICD-10-CM

## 2019-05-09 ENCOUNTER — Other Ambulatory Visit: Payer: Self-pay | Admitting: Rheumatology

## 2019-05-09 DIAGNOSIS — M353 Polymyalgia rheumatica: Secondary | ICD-10-CM

## 2019-05-09 NOTE — Telephone Encounter (Signed)
Last Visit: 03/22/19 Next Visit: 08/23/19 Labs: 02/08/19 RDW 16.1 PLQ Eye Exam: 04/05/2019 WNL  Okay to refill per Dr. Deveshwar  

## 2019-05-11 ENCOUNTER — Encounter: Payer: Self-pay | Admitting: Neurology

## 2019-05-13 NOTE — Progress Notes (Signed)
[facility] Clinic Note - Initial Visit   Date: 05/13/19  Isabel SorrowSandra C Morgan MRN: [id number] DOB: 02-16-1946   Dear Dr. Thurston HoleWainer:  Thank you for your kind referral of [name] for consultation of neuropathy. Although her history is well known to you, please allow us to reiterate it for the purpose of our medical record. The patient was accompanied to the clinic by self.    History of Present Illness: [name] is a 73 y.o. right-handed female with hypertension, hypothyroidism, polymyalgia rhematica, hyperlipidemia, fibromylagia, OA, and history of 3 lumbar surgeries presenting for evaluation of numbness.   Starting in 2019, she began having numbness in the heel region which occurs mostly at night time.  Sometimes it can involve the entire foot.  She denies burning or tingling of the feet.  Balance is good.  She has not suffered any falls and walks unassisted.  She does not have weakness.  No specific triggers or alleviating factors.     She also complains of itching sensation over the base of her neck which has been ongoing for 1 year. It is worse when she is laying flat, such as when sleeping.   Out-side paper records, electronic medical record, and images have been reviewed where available and summarized as: hyperten MRI brain wo contrast 09/19/2017: No acute abnormality. Interval development of scattered small white matter hyperintensities primarily in the subcortical and deep white matter. These lesions are nonspecific. Given the patient's age these may be due to chronic microvascular ischemia. Migraine headache and vasculitis also considerations.  MRI lumbar spine 05/10/2017: 1. Posterior lumbar interbody fusion from L2 through S1 and anterior interbody fusion at L3-4. No recurrent central canal or foraminal stenosis at these levels. 2. At L1-2 there is a broad-based disc bulge. Mild bilateral facet arthropathy. Mild spinal stenosis. Moderate  left foraminal stenosis.  Lab Results  Component Value Date   TSH 0.81 09/12/2017   Lab Results  Component Value Date   ESRSEDRATE 31 (H) 07/18/2018    Past Medical History:  Diagnosis Date  . Bilateral ovarian cysts 09/12/2014   bilateral simple ovarian cysts.  US due in January 2018.  . 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 Fibromyalgia   . HTN (hypertension)    under control with meds., has been on med. x 20 yr.  . Hyperlipidemia   . Hypothyroid   . IBS (irritable bowel syndrome)   . Impingement syndrome of right shoulder 07/2013  . Polymyalgia rheumatica (HCC)    5-28  states she has been on prednisone and now is trying to only be on plaquenil to manage the polymyalgia   . PONV (postoperative nausea and vomiting)   . Primary localized osteoarthritis of right knee   . Rupture of right rotator cuff 07/2013  . VIN I (vulvar intraepithelial neoplasia I) 2017, 2018   this is from a perineal/vulvar biopsy.  . Vitamin B12 deficiency     Past Surgical History:  Procedure Laterality Date  . ANTERIOR LAT LUMBAR FUSION  10/08/2008   L3-4  . BACK SURGERY    . BACK SURGERY  10-03-14   --[facility]  . COLONOSCOPY W/ BIOPSIES  11/2009   polyps  . COLONOSCOPY W/ BIOPSIES  11/2012   polyp, recheck in 3 years  . CYSTO  07/05/2006  . CYSTOCELE REPAIR  07/05/2006  . ESOPHAGOGASTRODUODENOSCOPY ENDOSCOPY  05/25/05   erythema and edema of gastric lumen  . EYE SURGERY  Right    muscle adjustment   . FOOT SURGERY Right   . GANGLION CYST EXCISION Right    wrist  . INCONTINENCE SURGERY  07/05/2006   cystourethropexy  . ORIF FOREARM FRACTURE Right 09/2012  . SHOULDER ARTHROSCOPY WITH ROTATOR CUFF REPAIR AND SUBACROMIAL DECOMPRESSION Right 08/02/2013   Procedure: SHOULDER ARTHROSCOPY WITH ROTATOR CUFF REPAIR AND SUBACROMIAL DECOMPRESSION, EXTENSIVE DEBRIDEMENT (INCLUDING LABRUM), PARTIAL ACROMIPLASY WITH CORACOACROMIAL RELEASE, DISTAL  CLAVICULECTOMY;  Surgeon: Loreta Ave, MD;  Location: [facility];  Service: Orthopedics;  Laterality: Right;  . STRABISMUS SURGERY Right 01/26/2008   2 other surgeries previously  . STRABISMUS SURGERY Right 06/03/2017   Procedure: REPAIR STRABISMUS RIGHT EYE;  Surgeon: Verne Carrow, MD;  Location: [facility];  Service: Ophthalmology;  Laterality: Right;  . Thumb surgery  2/13 & ?2010   Bilateral - states a joint was replaced  . TOTAL KNEE ARTHROPLASTY Right 02/12/2019   Procedure: TOTAL KNEE ARTHROPLASTY;  Surgeon: Salvatore Marvel, MD;  Location: [facility];  Service: Orthopedics;  Laterality: Right;  . TOTAL VAGINAL HYSTERECTOMY  1986 - or age 89   secondary AUB  . TUBAL LIGATION     laparosopic BTL age 19  . varicose veins Bilateral      Medications:  Outpatient Encounter Medications as of 05/14/2019  Medication Sig  . benazepril (LOTENSIN) 40 MG tablet Take 40 mg by mouth daily.   . Calcium Carb-Cholecalciferol (CALCIUM 600 + D PO) Take 1 tablet by mouth daily.  . diclofenac sodium (VOLTAREN) 1 % GEL Apply 3 gm to 3 large joints up to 3 times a day.Dispense 3 tubes with 3 refills. (Patient taking differently: Apply 1 application topically 3 (three) times daily as needed (PAIN). Apply 3 gm to 3 large joints up to 3 times a day.Dispense 3 tubes with 3 refills.)  . hydroxychloroquine (PLAQUENIL) 200 MG tablet TAKE 1 TABLET BY MOUTH  DAILY  . levothyroxine (SYNTHROID, LEVOTHROID) 112 MCG tablet Take 112 mcg by mouth daily before breakfast.  . magnesium gluconate (MAGONATE) 500 MG tablet Take 500 mg by mouth daily.  . Multiple Vitamin (MULTIVITAMIN) tablet Take 1 tablet by mouth daily.  Marland Kitchen OVER THE COUNTER MEDICATION Take 1 tablet by mouth daily. Focus Factor  . predniSONE (DELTASONE) 1 MG tablet TAKE 3 TABLETS (3 MG TOTAL) BY MOUTH DAILY WITH BREAKFAST. (Patient taking differently: Take 2 mg by mouth daily with breakfast. )  . aspirin EC 325 MG EC tablet 1 tab a  day for the next 30 days to prevent blood clots (Patient not taking: Reported on 03/22/2019)  . docusate sodium (COLACE) 100 MG capsule 1 tab 2 times a day while on narcotics.  STOOL SOFTENER (Patient not taking: Reported on 03/22/2019)  . gabapentin (NEURONTIN) 300 MG capsule 1 capsule at bedtime for nerve pain  . oxyCODONE (OXY IR/ROXICODONE) 5 MG immediate release tablet 1 po q 4 hrs prn pain (Patient not taking: Reported on 03/22/2019)  . polyethylene glycol (MIRALAX / GLYCOLAX) 17 g packet 17grams in 6 ounces of something to drink twice a day until bowel movement.  LAXITIVE.  Restart if two days since last bowel movement (Patient not taking: Reported on 03/22/2019)   No facility-administered encounter medications on file as of 05/14/2019.     Allergies:  Allergies  Allergen Reactions  . Penicillins Itching    OF MOUTH Did it involve swelling of the face/tongue/throat, SOB, or low BP? Unknown Did it involve sudden or severe rash/hives, skin peeling, or  any reaction on the inside of your mouth or nose? Unknown Did you need to seek medical attention at a hospital or doctor's office? Unknown When did it last happen?in my 6s If all above answers are "NO", may proceed with cephalosporin use.   . Sulfa Antibiotics Itching    OF MOUTH    Family History: Family History  Problem Relation Age of Onset  . Aortic aneurysm Father   . Hypertension Father   . Coronary artery disease Mother 89  . Hypertension Mother   . Hyperlipidemia Brother        brain and kidney tumor, no treatment needed     Social History: Social History   Tobacco Use  . Smoking status: Never Smoker  . Smokeless tobacco: Never Used  Substance Use Topics  . Alcohol use: No    Alcohol/week: 0.0 standard drinks  . Drug use: No   Social History   Social History Narrative   Lives at home with husband . One story home. Right handed    Review of Systems:  CONSTITUTIONAL: No fevers, chills, night sweats, or weight  loss.   EYES: No visual changes or eye pain ENT: No hearing changes.  No history of nose bleeds.   RESPIRATORY: No cough, wheezing and shortness of breath.   CARDIOVASCULAR: Negative for chest pain, and palpitations.   GI: Negative for abdominal discomfort, blood in stools or black stools.  No recent change in bowel habits.   GU:  No history of incontinence.   MUSCLOSKELETAL: No history of joint pain or swelling.  +myalgias.   SKIN: Negative for lesions, rash, and itching.   HEMATOLOGY/ONCOLOGY: Negative for prolonged bleeding, bruising easily, and swollen nodes.  No history of cancer.   ENDOCRINE: Negative for cold or heat intolerance, polydipsia or goiter.   PSYCH:  Nodepression or anxiety symptoms.   NEURO: As Above.   Vital Signs:  Ht 5\' 8"  (1.727 m)   Wt 175 lb (79.4 kg)   LMP 09/13/1984 (Approximate)   BMI 26.61 kg/m    General Medical Exam:   General:  Well appearing, comfortable.   Eyes/ENT: see cranial nerve examination.   Neck:   No carotid bruits. Respiratory:  Clear to auscultation, good air entry bilaterally.   Cardiac:  Regular rate and rhythm, no murmur.   Extremities:  No deformities, edema, or skin discoloration.  Skin:  No rashes or lesions.  Neurological Exam: MENTAL STATUS including orientation to time, place, person, recent and remote memory, attention span and concentration, language, and fund of knowledge is normal.  Speech is not dysarthric.  CRANIAL NERVES: II:  No visual field defects.   III-IV-VI: Pupils equal round and reactive to light.  Normal conjugate, extra-ocular eye movements in all directions of gaze.  No nystagmus.  No ptosis.   V:  Normal facial sensation.    VII:  Normal facial symmetry and movements.   VIII:  Normal hearing and vestibular function.   IX-X:  Normal palatal movement.   XI:  Normal shoulder shrug and head rotation.   XII:  Normal tongue strength and range of motion, no deviation or fasciculation.  MOTOR:  No atrophy,  fasciculations or abnormal movements.  No pronator drift.   Upper Extremity:  Right  Left  Deltoid  5/5   5/5   Biceps  5/5   5/5   Triceps  5/5   5/5   Infraspinatus 5/5  5/5  Medial pectoralis 5/5  5/5  Wrist extensors  5/5  5/5   Wrist flexors  5/5   5/5   Finger extensors  5/5   5/5   Finger flexors  5/5   5/5   Dorsal interossei  5/5   5/5   Abductor pollicis  5/5   5/5   Tone (Ashworth scale)  0  0   Lower Extremity:  Right  Left  Hip flexors  5/5   5/5   Hip extensors  5/5   5/5   Adductor 5/5  5/5  Abductor 5/5  5/5  Knee flexors  5/5   5/5   Knee extensors  5/5   5/5   Dorsiflexors  5/5   5/5   Plantarflexors  5/5   5/5   Toe extensors  5/5   5/5   Toe flexors  5/5   5/5   Tone (Ashworth scale)  0  0   MSRs:  Right        Left                  brachioradialis 2+  2+  biceps 2+  2+  triceps 2+  2+  patellar 1+  1+  ankle jerk tr  tr  Hoffman no  no  plantar response down  down   SENSORY:  Vibration is reduced at the great toe bilaterally, otherwise sensation is normal and symmetric perception of light touch, pinprick, vibration, and proprioception.  Romberg's sign absent.   COORDINATION/GAIT: Normal finger-to- nose-finger and heel-to-shin.  Intact rapid alternating movements bilaterally. Gait narrow based and stable. Tandem and stressed gait intact.    IMPRESSION: 1. Bilateral feet paresthesias.  She will undergo evaluation for neuropathy with EDX of the legs.  Her sensation is only minimally reduced on exam and therefore raises the concern if her numbness could be stemming from known lumbosacral degenerative changes.  If EDX does show neuropathy, additional labs can be checked.  She does not have any risk factors for neuropathy such as diabetes or alcohol use.  She has B12 deficiency and is compliant with supplementation. It was explained that there is no role for medications for non-painful symptoms, such as numbness.  Should she develop burning/tingling  discomfort in the feet, this can be readdressed.  2. Skin itching/irritation over the back. I am not sure what to make of these symptoms.  They do not fit a dermatomal region and is not consistent with notaglia paresthetica.   Further recommendations pending results.  Greater than 50% of this 45 minute visit was spent in counseling, explanation of diagnosis, planning of further management, and coordination of care.   Thank you for allowing me to participate in patient's care.  If I can answer any additional questions, I would be pleased to do so.    Sincerely,    Donika K. Allena KatzPatel, DO

## 2019-05-14 ENCOUNTER — Ambulatory Visit (INDEPENDENT_AMBULATORY_CARE_PROVIDER_SITE_OTHER): Payer: Medicare Other | Admitting: Neurology

## 2019-05-14 ENCOUNTER — Encounter: Payer: Self-pay | Admitting: Neurology

## 2019-05-14 ENCOUNTER — Other Ambulatory Visit: Payer: Self-pay

## 2019-05-14 ENCOUNTER — Other Ambulatory Visit: Payer: Self-pay | Admitting: Rheumatology

## 2019-05-14 VITALS — BP 114/78 | HR 78 | Ht 68.0 in | Wt 175.0 lb

## 2019-05-14 DIAGNOSIS — R208 Other disturbances of skin sensation: Secondary | ICD-10-CM

## 2019-05-14 DIAGNOSIS — R200 Anesthesia of skin: Secondary | ICD-10-CM

## 2019-05-14 DIAGNOSIS — M353 Polymyalgia rheumatica: Secondary | ICD-10-CM

## 2019-05-14 NOTE — Telephone Encounter (Signed)
Last Visit: 03/22/19 Next Visit: 08/23/19 Labs: 02/08/19 RDW 16.1 PLQ Eye Exam: 04/05/2019 WNL  Okay to refill per Dr. Estanislado Pandy

## 2019-05-14 NOTE — Patient Instructions (Signed)
Nerve testing of the legs.  Do not apply lotion to your legs or feet.   ELECTROMYOGRAM AND NERVE CONDUCTION STUDIES (EMG/NCS) INSTRUCTIONS  How to Prepare The neurologist conducting the EMG will need to know if you have certain medical conditions. Tell the neurologist and other EMG lab personnel if you: . Have a pacemaker or any other electrical medical device . Take blood-thinning medications . Have hemophilia, a blood-clotting disorder that causes prolonged bleeding Bathing Take a shower or bath shortly before your exam in order to remove oils from your skin. Don't apply lotions or creams before the exam.  What to Expect You'll likely be asked to change into a hospital gown for the procedure and lie down on an examination table. The following explanations can help you understand what will happen during the exam.  . Electrodes. The neurologist or a technician places surface electrodes at various locations on your skin depending on where you're experiencing symptoms. Or the neurologist may insert needle electrodes at different sites depending on your symptoms.  . Sensations. The electrodes will at times transmit a tiny electrical current that you may feel as a twinge or spasm. The needle electrode may cause discomfort or pain that usually ends shortly after the needle is removed. If you are concerned about discomfort or pain, you may want to talk to the neurologist about taking a short break during the exam.  . Instructions. During the needle EMG, the neurologist will assess whether there is any spontaneous electrical activity when the muscle is at rest - activity that isn't present in healthy muscle tissue - and the degree of activity when you slightly contract the muscle.  He or she will give you instructions on resting and contracting a muscle at appropriate times. Depending on what muscles and nerves the neurologist is examining, he or she may ask you to change positions during the exam.  After  your EMG You may experience some temporary, minor bruising where the needle electrode was inserted into your muscle. This bruising should fade within several days. If it persists, contact your primary care doctor.

## 2019-05-15 ENCOUNTER — Ambulatory Visit (INDEPENDENT_AMBULATORY_CARE_PROVIDER_SITE_OTHER): Payer: Medicare Other | Admitting: Neurology

## 2019-05-15 DIAGNOSIS — R200 Anesthesia of skin: Secondary | ICD-10-CM | POA: Diagnosis not present

## 2019-05-15 DIAGNOSIS — R208 Other disturbances of skin sensation: Secondary | ICD-10-CM

## 2019-05-15 NOTE — Procedures (Signed)
[facility]  [address], [address]  [place], [place] [address] Tel: [phone] Fax:  [phone] Test Date:  05/15/2019  Patient: [name] DOB: [birth year] Physician: Nita Sickleonika Patel, DO  Sex: Female Height: 5\' 2"  Ref Phys: Nita Sickleonika Patel, DO  ID#: [id number] Temp: 31.0C Technician:    Patient Complaints: This is a 73 year old female with history of lumbar decompression and fusion x 3 referred for evaluation of bilateral feet numbness.  NCV & EMG Findings: Extensive electrodiagnostic testing of the right lower extremity and additional studies of the left shows:  1. Bilateral sural and superficial peroneal sensory responses are within normal limits. 2. Bilateral tibial motor responses show reduced amplitudes.  Right peroneal motor response shows asymmetrically reduced amplitude (1.4 mV) and decreased conduction velocity (B Fib-Ankle, 38 m/s).  Peroneal motor responses at the tibialis anterior are within normal limits bilaterally.   3. Bilateral tibial H reflex studies are absent.   4. Chronic motor axonal loss changes are seen affecting the L5-S1 myotomes bilaterally, without accompanied active denervation.    Impression: 1. Chronic L5-S1 radiculopathy affecting lower extremities, mild-to-moderate. 2. There is no evidence of a large fiber sensorimotor polyneuropathy affecting the lower extremities.   ___________________________ Nita Sickleonika Patel, DO    Nerve Conduction Studies Anti Sensory Summary Table   Site NR Peak (ms) Norm Peak (ms) P-T Amp (V) Norm P-T Amp  Left Sup Peroneal Anti Sensory (Ant Lat Mall)  31C  12 cm    4.0 <4.6 4.2 >3  Right Sup Peroneal Anti Sensory (Ant Lat Mall)  31C  12 cm    2.4 <4.6 5.4 >3  Left Sural Anti Sensory (Lat Mall)  31C  Calf    3.5 <4.6 8.4 >3  Right Sural Anti Sensory (Lat Mall)  31C  Calf    3.9 <4.6 5.7 >3   Motor Summary Table   Site NR Onset (ms) Norm Onset (ms) O-P Amp (mV) Norm O-P Amp Site1 Site2 Delta-0  (ms) Dist (cm) Vel (m/s) Norm Vel (m/s)  Left Peroneal Motor (Ext Dig Brev)  31C  Ankle    4.7 <6.0 4.4 >2.5 B Fib Ankle 8.1 38.0 47 >40  B Fib    12.8  3.7  Poplt B Fib 2.0 9.0 45 >40  Poplt    14.8  2.9         Right Peroneal Motor (Ext Dig Brev)  31C  Ankle    4.8 <6.0 1.4 >2.5 B Fib Ankle 10.5 40.0 38 >40  B Fib    15.3  1.3  Poplt B Fib 2.4 10.0 42 >40  Poplt    17.7  1.1         Left Peroneal TA Motor (Tib Ant)  31C  Fib Head    2.5 <4.5 4.1 >3 Poplit Fib Head 1.6 8.0 50 >40  Poplit    4.1  3.8         Right Peroneal TA Motor (Tib Ant)  31C  Fib Head    2.4 <4.5 3.9 >3 Poplit Fib Head 1.7 8.0 47 >40  Poplit    4.1  3.7         Left Tibial Motor (Abd Hall Brev)  31C  Ankle    4.3 <6.0 2.7 >4 Knee Ankle 10.9 44.0 40 >40  Knee    15.2  1.4         Right Tibial Motor (Abd Hall Brev)  31C  Ankle    4.4 <6.0 3.7 >4  Knee Ankle 11.0 44.0 40 >40  Knee    15.4  1.9          H Reflex Studies   NR H-Lat (ms) Lat Norm (ms) L-R H-Lat (ms)  Left Tibial (Gastroc)  31C  NR  <35   Right Tibial (Gastroc)  31C  NR  <35    EMG   Side Muscle Ins Act Fibs Psw Fasc Number Recrt Dur Dur. Amp Amp. Poly Poly. Comment  Right AntTibialis Nml Nml Nml Nml 1- Rapid Some 1+ Some 1+ Nml Nml N/A  Right Gastroc Nml Nml Nml Nml 1- Rapid Some 1+ Some 1+ Nml Nml N/A  Right Flex Dig Long Nml Nml Nml Nml 1- Rapid Some 1+ Some 1+ Nml Nml N/A  Right RectFemoris Nml Nml Nml Nml Nml Nml Nml Nml Nml Nml Nml Nml N/A  Right GluteusMed Nml Nml Nml Nml 1- Rapid Some 1+ Some 1+ Nml Nml N/A  Right BicepsFemS Nml Nml Nml Nml 1- Rapid Some 1+ Some 1+ Nml Nml N/A  Left BicepsFemS Nml Nml Nml Nml 1- Rapid Some 1+ Some 1+ Nml Nml N/A  Left AntTibialis Nml Nml Nml Nml 1- Rapid Some 1+ Some 1+ Nml Nml N/A  Left Gastroc Nml Nml Nml Nml 1- Rapid Some 1+ Some 1+ Nml Nml N/A  Left Flex Dig Long Nml Nml Nml Nml 1- Rapid Some 1+ Some 1+ Nml Nml N/A  Left RectFemoris Nml Nml Nml Nml Nml Nml Nml Nml Nml Nml Nml Nml N/A  Left  GluteusMed Nml Nml Nml Nml 1- Rapid Some 1+ Some 1+ Nml Nml N/A      Waveforms:

## 2019-05-17 ENCOUNTER — Other Ambulatory Visit: Payer: Self-pay

## 2019-05-17 ENCOUNTER — Ambulatory Visit
Admission: RE | Admit: 2019-05-17 | Discharge: 2019-05-17 | Disposition: A | Discharge status: Home / Self Care | Payer: Medicare Other | Source: Ambulatory Visit | Attending: Obstetrics and Gynecology | Admitting: Obstetrics and Gynecology

## 2019-05-17 DIAGNOSIS — Z1231 Encounter for screening mammogram for malignant neoplasm of breast: Secondary | ICD-10-CM

## 2019-05-18 ENCOUNTER — Telehealth: Payer: Self-pay

## 2019-05-18 NOTE — Telephone Encounter (Signed)
Patient advised.

## 2019-05-18 NOTE — Telephone Encounter (Signed)
-----   Message from Alda Berthold, DO sent at 05/18/2019 10:16 AM EDT ----- Please inform patient that her EMG overall looks good - specifically, it did not show neuropathy. There is findings of old nerve injury from her back, which could be causing some of her feet numbness.  Unfortunately, there is nothing to help heal the nerves.  If she had additional questions, request her to schedule VV/telepone follow-up visit (next wed has availability)

## 2019-05-25 ENCOUNTER — Other Ambulatory Visit: Payer: Self-pay

## 2019-05-25 ENCOUNTER — Telehealth (INDEPENDENT_AMBULATORY_CARE_PROVIDER_SITE_OTHER): Payer: Medicare Other | Admitting: Neurology

## 2019-05-25 ENCOUNTER — Encounter: Payer: Self-pay | Admitting: Neurology

## 2019-05-25 DIAGNOSIS — R200 Anesthesia of skin: Secondary | ICD-10-CM | POA: Diagnosis not present

## 2019-05-25 DIAGNOSIS — M5417 Radiculopathy, lumbosacral region: Secondary | ICD-10-CM | POA: Diagnosis not present

## 2019-05-25 NOTE — Progress Notes (Signed)
   Virtual Visit via Video Note The purpose of this virtual visit is to provide medical care while limiting exposure to the novel coronavirus.    Consent was obtained for video visit:  Yes.   Answered questions that patient had about telehealth interaction:  Yes.   I discussed the limitations, risks, security and privacy concerns of performing an evaluation and management service by telemedicine. I also discussed with the patient that there may be a patient responsible charge related to this service. The patient expressed understanding and agreed to proceed.  Pt location: Home Physician Location: office Name of referring provider:  Leonard Morgan, * I connected with [name] at patients initiation/request on 05/25/2019 at  1:30 PM EDT by video enabled telemedicine application and verified that I am speaking with the correct person using two identifiers. Pt MRN:  [id number] Pt DOB:  09-15-1945 Video Participants:  [name]   History of Present Illness: This is a 73 y.o. female with hypertension, hypothyroidism, polymyalgia rhematica, hyperlipidemia, fibromylagia, OA, and history of 3 lumbar surgeries returning for follow-up of bilateral feet numbness.  Today, she also complains of right leg weakness and chronic low back pain.  She underwent electrodiagnostic testing of the legs which did not show evidence of neuropathy. There were chronic findings of L5-S1 radiculopathy.   Observations/Objective:   Patient is awake, alert, and appears comfortable.  Oriented x 4.   Extraocular muscles are intact. No ptosis.  Face is symmetric.  Speech is not dysarthric. Antigravity in all extremities.  DATA: NCS/EMG of the legs 05/15/2019: 1. Chronic L5-S1 radiculopathy affecting lower extremities, mild-to-moderate. 2. There is no evidence of a large fiber sensorimotor polyneuropathy affecting the lower extremities.  MRI brain wo contrast 09/19/2017: No acute abnormality. Interval  development of scattered small white matter hyperintensities primarily in the subcortical and deep white matter. These lesions are nonspecific. Given the patient's age these may be due to chronic microvascular ischemia. Migraine headache and vasculitis also considerations.  MRI lumbar spine 05/10/2017: 1. Posterior lumbar interbody fusion from L2 through S1 and anterior interbody fusion at L3-4. No recurrent central canal or foraminal stenosis at these levels. 2. At L1-2 there is a broad-based disc bulge. Mild bilateral facet arthropathy. Mild spinal stenosis. Moderate left foraminal stenosis.  Assessment and Plan:  Bilateral feet paresthesias leg weakness, likely contributed by lumbar degenerative changes.  Results of electrodiagnostic testing was discussed with patient and she was informed there is no evidence of neuropathy.  Her most recent MRI from August 2018 was reviewed and shows decompressed spinal cord.  I explained that old injury to her nerves may result in residual paresthesias.  She is concerned about her new right leg weakness and back pain and is requesting MRI lumbar spine to reevaluate.  She has completed physical therapy with no improvement.   Follow Up Instructions:   I discussed the assessment and treatment plan with the patient. The patient was provided an opportunity to ask questions and all were answered. The patient agreed with the plan and demonstrated an understanding of the instructions.   The patient was advised to call back or seek an in-person evaluation if the symptoms worsen or if the condition fails to improve as anticipated.  Total time spent:  20 minutes     Alda Berthold, DO

## 2019-05-28 ENCOUNTER — Other Ambulatory Visit: Payer: Self-pay

## 2019-05-28 DIAGNOSIS — M5417 Radiculopathy, lumbosacral region: Secondary | ICD-10-CM

## 2019-05-28 NOTE — Progress Notes (Signed)
Order placed for Mri lumbar spine wo contrast

## 2019-06-09 ENCOUNTER — Other Ambulatory Visit: Payer: Self-pay

## 2019-06-09 ENCOUNTER — Ambulatory Visit
Admission: RE | Admit: 2019-06-09 | Discharge: 2019-06-09 | Disposition: A | Discharge status: Home / Self Care | Payer: Medicare Other | Source: Ambulatory Visit | Attending: Neurology | Admitting: Neurology

## 2019-06-09 DIAGNOSIS — M5417 Radiculopathy, lumbosacral region: Secondary | ICD-10-CM

## 2019-06-11 ENCOUNTER — Telehealth: Payer: Self-pay

## 2019-06-11 NOTE — Telephone Encounter (Signed)
Pt advised.

## 2019-07-18 ENCOUNTER — Other Ambulatory Visit: Payer: Self-pay

## 2019-07-19 NOTE — Progress Notes (Signed)
73 y.o. [id number] Married Caucasian female here for annual exam.    Having a flare of her arthritis.  She is trying to stay off prednisone.  She has polymyalgia rheumatica and fibromyalgia.   States her bowel function is more controlled now.  She is watching her diet.  She has not tried the Metamucil for her fecal incontinence.   She has good bladder control.   PCP: Windle GuardWilson Elkins, MD  Patient's last menstrual period was 09/13/1984 (approximate).           Sexually active: No.  The current method of family planning is post menopausal status.    Exercising: No.  Some walking. Smoker:  no  Health Maintenance: Pap: 07-26-18 Neg,06-27-17 Neg, 06-03-16 ASCUS:Neg HR HPV History of abnormal Pap:  no MMG: 05-17-19 3D/Neg/density B/BiRads1 Colonoscopy: 2018 polyps;next due 2023 BMD:  09-29-17     Result :Osteopenia of hip.  FRAX model - 22.6% risk of major fracture and 3.2% risk of hip fracture.  Dr. Horald PollenBalen ordering.  TDaP:  2014 Gardasil:   no ZOX:WRUEAVHIV:Unsure Hep C: 04-05-16 Neg Screening Labs:  PCP.   Brought in copy today.  Flu vaccine:  Completed on 06/06/19.   reports that she has never smoked. She has never used smokeless tobacco. She reports that she does not drink alcohol or use drugs.  Past Medical History:  Diagnosis Date  . Bilateral ovarian cysts 09/12/2014   bilateral simple ovarian cysts.  US due in January 2018.  . 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 Fibromyalgia   . HTN (hypertension)    under control with meds., has been on med. x 20 yr.  . Hyperlipidemia   . Hypothyroid   . IBS (irritable bowel syndrome)   . Impingement syndrome of right shoulder 07/2013  . Polymyalgia rheumatica (HCC)    5-28  states she has been on prednisone and now is trying to only be on plaquenil to manage the polymyalgia   . PONV (postoperative nausea and vomiting)   . Primary localized osteoarthritis of right knee   . Rupture of right rotator cuff 07/2013  . VIN I (vulvar  intraepithelial neoplasia I) 2017, 2018   this is from a perineal/vulvar biopsy.  . Vitamin B12 deficiency     Past Surgical History:  Procedure Laterality Date  . ANTERIOR LAT LUMBAR FUSION  10/08/2008   L3-4  . BACK SURGERY    . BACK SURGERY  10-03-14   --Dr. Trey [facility]ailorsMark Roy [facility]  . COLONOSCOPY W/ BIOPSIES  11/2009   polyps  . COLONOSCOPY W/ BIOPSIES  11/2012   polyp, recheck in 3 years  . CYSTO  07/05/2006  . CYSTOCELE REPAIR  07/05/2006  . ESOPHAGOGASTRODUODENOSCOPY ENDOSCOPY  05/25/05   erythema and edema of gastric lumen  . EYE SURGERY Right    muscle adjustment   . FOOT SURGERY Right   . GANGLION CYST EXCISION Right    wrist  . INCONTINENCE SURGERY  07/05/2006   cystourethropexy  . ORIF FOREARM FRACTURE Right 09/2012  . SHOULDER ARTHROSCOPY WITH ROTATOR CUFF REPAIR AND SUBACROMIAL DECOMPRESSION Right 08/02/2013   Procedure: SHOULDER ARTHROSCOPY WITH ROTATOR CUFF REPAIR AND SUBACROMIAL DECOMPRESSION, EXTENSIVE DEBRIDEMENT (INCLUDING LABRUM), PARTIAL ACROMIPLASY WITH CORACOACROMIAL RELEASE, DISTAL CLAVICULECTOMY;  Surgeon: Loreta Aveaniel F Murphy, MD;  Location: [facility];  Service: Orthopedics;  Laterality: Right;  . STRABISMUS SURGERY Right 01/26/2008   2 other surgeries previously  . STRABISMUS SURGERY Right 06/03/2017   Procedure: REPAIR STRABISMUS RIGHT EYE;  Surgeon: Everitt Amber, MD;  Location: [place];  Service: Ophthalmology;  Laterality: Right;  . Thumb surgery  2/13 & ?2010   Bilateral - states a joint was replaced  . TOTAL KNEE ARTHROPLASTY Right 02/12/2019   Procedure: TOTAL KNEE ARTHROPLASTY;  Surgeon: Elsie Saas, MD;  Location: [facility];  Service: Orthopedics;  Laterality: Right;  . TOTAL VAGINAL HYSTERECTOMY  1986 - or age 60   secondary AUB  . TUBAL LIGATION     laparosopic BTL age 74  . varicose veins Bilateral     Current Outpatient Medications  Medication Sig Dispense Refill  . alendronate (FOSAMAX) 35 MG tablet Take  35 mg by mouth every 7 (seven) days. Take with a full glass of water on an empty stomach.    . benazepril (LOTENSIN) 40 MG tablet Take 40 mg by mouth daily.     . Calcium Carb-Cholecalciferol (CALCIUM 600 + D PO) Take 1 tablet by mouth daily.    . diclofenac sodium (VOLTAREN) 1 % GEL Apply 3 gm to 3 large joints up to 3 times a day.Dispense 3 tubes with 3 refills. (Patient taking differently: Apply 1 application topically 3 (three) times daily as needed (PAIN). Apply 3 gm to 3 large joints up to 3 times a day.Dispense 3 tubes with 3 refills.) 3 Tube 1  . hydroxychloroquine (PLAQUENIL) 200 MG tablet TAKE 1 TABLET BY MOUTH TWICE A DAY 180 tablet 0  . levothyroxine (SYNTHROID, LEVOTHROID) 112 MCG tablet Take 112 mcg by mouth daily before breakfast.    . magnesium gluconate (MAGONATE) 500 MG tablet Take 500 mg by mouth daily.    . Multiple Vitamin (MULTIVITAMIN) tablet Take 1 tablet by mouth daily.    Marland Kitchen OVER THE COUNTER MEDICATION Take 1 tablet by mouth daily. Focus Factor    . vitamin B-12 (CYANOCOBALAMIN) 100 MCG tablet Take 100 mcg by mouth daily.     No current facility-administered medications for this visit.     Family History  Problem Relation Age of Onset  . Aortic aneurysm Father   . Hypertension Father   . Coronary artery disease Mother 85  . Hypertension Mother   . Hyperlipidemia Brother        brain and kidney tumor, no treatment needed     Review of Systems  All other systems reviewed and are negative.   Exam:   BP 122/80 (Cuff Size: Large)   Pulse 70   Temp 97.7 F (36.5 C) (Temporal)   Resp 14   Ht 5' 7.5" (1.715 m)   Wt 184 lb (83.5 kg)   LMP 09/13/1984 (Approximate)   BMI 28.39 kg/m     General appearance: alert, cooperative and appears stated age Head: normocephalic, without obvious abnormality, atraumatic Neck: no adenopathy, supple, symmetrical, trachea midline and thyroid normal to inspection and palpation Lungs: clear to auscultation bilaterally Breasts:  normal appearance, no masses or tenderness, No nipple retraction or dimpling, No nipple discharge or bleeding, No axillary adenopathy Heart: regular rate and rhythm Abdomen: soft, non-tender; no masses, no organomegaly Extremities: extremities normal, atraumatic, no cyanosis or edema Skin: skin color, texture, turgor normal. No rashes or lesions Lymph nodes: cervical, supraclavicular, and axillary nodes normal. Neurologic: grossly normal  Pelvic: External genitalia:  no lesions.  Pin point dimple in the perineum.                No abnormal inguinal nodes palpated.  Urethra:  normal appearing urethra with no masses, tenderness or lesions              Bartholins and Skenes: normal                 Vagina: normal appearing vagina with normal color and discharge, no lesions.  First degree cystocele.              Cervix: absent.               Pap taken: No. Bimanual Exam:  Uterus: absent.               Adnexa: no mass, fullness, tenderness on left.  Subtle mass midline and left.               Rectal exam: Yes.  .  Confirms.              Anus:  normal sphincter tone, no lesions  Chaperone was present for exam.  Assessment:   Well woman visit with normal exam. Status post TVH.  Status post anterior colporrhaphy. Small simple left ovarian cyst.  Minimal increase in size over time. 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 Normal CA125.  Fecal incontinence.  VIN I.  No evidence of lesions today. Osteopenia with increased risk of fracture.  On Fosamax.   Plan: Mammogram screening discussed. Self breast awareness reviewed. Pap and HR HPV as above. Guidelines for Calcium, Vitamin D, regular exercise program including cardiovascular and weight bearing exercise. I recommended Metamucil to patient.  Yearly pelvic US.  Will do in January, 2021. Follow up annually and prn.   After visit summary provided.

## 2019-07-23 ENCOUNTER — Other Ambulatory Visit: Payer: Self-pay

## 2019-07-23 ENCOUNTER — Encounter: Payer: Self-pay | Admitting: Obstetrics and Gynecology

## 2019-07-23 ENCOUNTER — Ambulatory Visit (INDEPENDENT_AMBULATORY_CARE_PROVIDER_SITE_OTHER): Payer: Medicare Other | Admitting: Obstetrics and Gynecology

## 2019-07-23 VITALS — BP 122/80 | HR 70 | Temp 97.7°F | Resp 14 | Ht 67.5 in | Wt 184.0 lb

## 2019-07-23 DIAGNOSIS — N83202 Unspecified ovarian cyst, left side: Secondary | ICD-10-CM | POA: Diagnosis not present

## 2019-07-23 DIAGNOSIS — Z01419 Encounter for gynecological examination (general) (routine) without abnormal findings: Secondary | ICD-10-CM

## 2019-07-23 NOTE — Patient Instructions (Signed)

## 2019-07-24 ENCOUNTER — Telehealth: Payer: Self-pay | Admitting: Rheumatology

## 2019-07-24 NOTE — Telephone Encounter (Signed)
Patient states she is having trouble with being "stiff all over. Patient states she is having trouble moving in the bed. Patient is having stiffness in her neck, shoulders and pain in her arms. Patient has been scheduled for an appointment on 07/25/19 at 8:40 am.

## 2019-07-24 NOTE — Telephone Encounter (Signed)
Patient called stating she is having difficulty turning over in bed due to pain.  Patient is requesting prescription of Prednisone to be sent to [facility] at [address] in [place].

## 2019-07-24 NOTE — Progress Notes (Signed)
Office Visit Note  Patient: [name]             Date of Birth: [birth year]           MRN: [id number]             PCP: Leonard Downing, MD Referring: Leonard Downing, * Visit Date: 07/25/2019 Occupation: _0 @  Subjective:  Muscle tenderness   History of Present Illness: [name] is a 73 y.o. female with history of PMR, osteoarthritis, and fibromyalgia.  She is taking Plaquenil 200 mg 1 tablet by mouth twice daily.  She discontinued prednisone about 1 month ago.  She has been having increased muscle aches and muscle tenderness.  She is having difficulty raising her arms above her head and getting up from a chair.  She denies any muscle weakness.  She has been having increased joint stiffness, which has been lasting all day. She has also had increased neck pain and stiffness.  She denies any symptoms of radiculopathy.    Activities of Daily Living:  Patient reports joint stiffness all day  Patient Reports nocturnal pain.  Difficulty dressing/grooming: Denies Difficulty climbing stairs: Denies Difficulty getting out of chair: Reports Difficulty using hands for taps, buttons, cutlery, and/or writing: Denies  Review of Systems  Constitutional: Negative for fatigue.  HENT: Negative for mouth sores, mouth dryness and nose dryness.   Eyes: Positive for dryness. Negative for pain and visual disturbance.  Respiratory: Negative for cough, hemoptysis, shortness of breath and difficulty breathing.   Cardiovascular: Negative for chest pain, palpitations, hypertension and swelling in legs/feet.  Gastrointestinal: Negative for blood in stool, constipation and diarrhea.  Endocrine: Negative for increased urination.  Genitourinary: Negative for painful urination.  Musculoskeletal: Positive for arthralgias, joint pain, myalgias, morning stiffness, muscle tenderness and myalgias. Negative for joint swelling and muscle weakness.  Skin: Negative for color change,  pallor, rash, hair loss, nodules/bumps, skin tightness, ulcers and sensitivity to sunlight.  Allergic/Immunologic: Negative for susceptible to infections.  Neurological: Negative for dizziness, numbness, headaches and weakness.  Hematological: Negative for swollen glands.  Psychiatric/Behavioral: Negative for depressed mood and sleep disturbance. The patient is not nervous/anxious.     PMFS History:  Patient Active Problem List   Diagnosis Date Noted  . Primary localized osteoarthritis of right knee 10/19/2018  . Right foot pain 10/19/2018  . Incontinence of feces 09/21/2018  . Vulvar lesion 07/20/2018  . History of repair of right rotator cuff 03/03/2017  . Primary osteoarthritis of both hands/ has had bilateral [place] surgery  03/03/2017  . History of hypertension 03/03/2017  . History of hyperlipidemia 03/03/2017  . DDD (degenerative disc disease), lumbar/ history of lumbar surgery  03/03/2017  . Osteopenia of multiple sites 03/03/2017  . Chronic left shoulder pain 01/20/2017  . Primary osteoarthritis of both knees 01/20/2017  . Trapezius muscle spasm 01/20/2017  . Insomnia 01/20/2017  . Fatigue 01/20/2017  . Fibromyalgia 01/20/2017  . High risk medications (not anticoagulants) long-term use 01/20/2017  . PMR (polymyalgia rheumatica) (HCC) 01/20/2017  . Left ovarian cyst 06/02/2015  . Degenerative joint disease, shoulder, right 08/02/2013  . Precordial pain 02/11/2012  . HTN (hypertension) 02/11/2012  . Hyperlipidemia 02/11/2012    Past Medical History:  Diagnosis Date  . Bilateral ovarian cysts 09/12/2014   bilateral simple ovarian cysts.  [place] due in January 2018.  . 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 Fibromyalgia   . HTN (hypertension)    under control with meds.,  has been on med. x 20 yr.  . Hyperlipidemia   . Hypothyroid   . IBS (irritable bowel syndrome)   . Impingement syndrome of right shoulder 07/2013  . Polymyalgia rheumatica (HCC)     5-28  states she has been on prednisone and now is trying to only be on plaquenil to manage the polymyalgia   . PONV (postoperative nausea and vomiting)   . Primary localized osteoarthritis of right knee   . Rupture of right rotator cuff 07/2013  . VIN I (vulvar intraepithelial neoplasia I) 2017, 2018   this is from a perineal/vulvar biopsy.  . Vitamin B12 deficiency     Family History  Problem Relation Age of Onset  . Aortic aneurysm Father   . Hypertension Father   . Coronary artery disease Mother 73  . Hypertension Mother   . Hyperlipidemia Brother        brain and kidney tumor, no treatment needed    Past Surgical History:  Procedure Laterality Date  . ANTERIOR LAT LUMBAR FUSION  10/08/2008   L3-4  . BACK SURGERY    . BACK SURGERY  10-03-14   --Dr. Glenna Fellows at [facility]  11/2009   polyps  . COLONOSCOPY W/ BIOPSIES  11/2012   polyp, recheck in 3 years  . CYSTO  07/05/2006  . CYSTOCELE REPAIR  07/05/2006  . ESOPHAGOGASTRODUODENOSCOPY ENDOSCOPY  05/25/05   erythema and edema of gastric lumen  . EYE SURGERY Right    muscle adjustment   . FOOT SURGERY Right   . GANGLION CYST EXCISION Right    wrist  . INCONTINENCE SURGERY  07/05/2006   cystourethropexy  . ORIF FOREARM FRACTURE Right 09/2012  . SHOULDER ARTHROSCOPY WITH ROTATOR CUFF REPAIR AND SUBACROMIAL DECOMPRESSION Right 08/02/2013   Procedure: SHOULDER ARTHROSCOPY WITH ROTATOR CUFF REPAIR AND SUBACROMIAL DECOMPRESSION, EXTENSIVE DEBRIDEMENT (INCLUDING LABRUM), PARTIAL ACROMIPLASY WITH CORACOACROMIAL RELEASE, DISTAL CLAVICULECTOMY;  Surgeon: Ninetta Lights, MD;  Location: [facility];  Service: Orthopedics;  Laterality: Right;  . STRABISMUS SURGERY Right 01/26/2008   2 other surgeries previously  . STRABISMUS SURGERY Right 06/03/2017   Procedure: REPAIR STRABISMUS RIGHT EYE;  Surgeon: Everitt Amber, MD;  Location: [facility];  Service: Ophthalmology;  Laterality:  Right;  . Thumb surgery  2/13 & ?2010   Bilateral - states a joint was replaced  . TOTAL KNEE ARTHROPLASTY Right 02/12/2019   Procedure: TOTAL KNEE ARTHROPLASTY;  Surgeon: Elsie Saas, MD;  Location: [facility];  Service: Orthopedics;  Laterality: Right;  . TOTAL VAGINAL HYSTERECTOMY  1986 - or age 59   secondary AUB  . TUBAL LIGATION     laparosopic BTL age 56  . varicose veins Bilateral    Social History   Social History Narrative   Lives at home with husband . One story home. Right handed   Immunization History  Administered Date(s) Administered  . Influenza-Unspecified 08/04/2017  . Pneumococcal Conjugate-13 05/14/2014, 05/15/2015  . Tdap 09/13/2012     Objective: Vital Signs: BP 133/81 (BP Location: Left Arm, Patient Position: Sitting, Cuff Size: Normal)   Pulse 76   Resp 13   Ht _0  (1.727 m)   Wt 187 lb 3.2 oz (84.9 kg)   LMP 09/13/1984 (Approximate)   BMI 28.46 kg/m    Physical Exam Vitals signs and nursing note reviewed.  Constitutional:      Appearance: She is well-developed.  HENT:     Head: Normocephalic and atraumatic.  Eyes:     Conjunctiva/sclera: Conjunctivae normal.  Neck:     Musculoskeletal: Normal range of motion.  Cardiovascular:     Rate and Rhythm: Normal rate and regular rhythm.     Heart sounds: Normal heart sounds.  Pulmonary:     Effort: Pulmonary effort is normal.     Breath sounds: Normal breath sounds.  Abdominal:     General: Bowel sounds are normal.     Palpations: Abdomen is soft.  Lymphadenopathy:     Cervical: No cervical adenopathy.  Skin:    General: Skin is warm and dry.     Capillary Refill: Capillary refill takes less than 2 seconds.  Neurological:     Mental Status: She is alert and oriented to person, place, and time.  Psychiatric:        Behavior: Behavior normal.      Musculoskeletal Exam: C-spine limited ROM. Limited and painful ROM of the lumbar spine.  Shoulder joints limited ROM with discomfort bilaterally.   Elbow joints good ROM with no tenderness or inflammation.  Limited ROM of both wrist joints.  Ulnar styloid prominence bilaterally. PIP and DIP synovial thickening consistent with osteoarthritis of both hands. Hip joints slightly limited ROM.  Right knee replacement good ROM with mild warmth.  Left knee good ROM with no discomfort.  Ankle joints good ROM with no discomfort.    CDAI Exam: CDAI Score: - Patient Global: -; Provider Global: - Swollen: -; Tender: - Joint Exam   No joint exam has been documented for this visit   There is currently no information documented on the homunculus. Go to the Rheumatology activity and complete the homunculus joint exam.  Investigation: No additional findings.  Imaging: No results found.  Recent Labs: Lab Results  Component Value Date   WBC 14.9 (H) 02/13/2019   HGB 10.9 (L) 02/13/2019   PLT 248 02/13/2019   NA 139 02/13/2019   K 4.0 02/13/2019   CL 110 02/13/2019   CO2 22 02/13/2019   GLUCOSE 147 (H) 02/13/2019   BUN 12 02/13/2019   CREATININE 0.57 02/13/2019   BILITOT 0.1 (L) 02/08/2019   ALKPHOS 73 02/08/2019   AST 22 02/08/2019   ALT 17 02/08/2019   PROT 7.5 02/08/2019   ALBUMIN 4.1 02/08/2019   CALCIUM 8.5 (L) 02/13/2019   GFRAA >60 02/13/2019    Speciality Comments: PLQ Eye Exam: 04/05/2019 WNL @ [facility] Follow up in 1 year  Procedures:  No procedures performed Allergies: Penicillins and Sulfa antibiotics     Assessment / Plan:     Visit Diagnoses: PMR (polymyalgia rheumatica) ([place]) -She presents today with increased myalgias and muscle tenderness for the past 1 month. She has been having difficulty getting up from a chair and raising her arms above her head.  She has no obvious muscle weakness.  She discontinued prednisone about 1 month ago. She continues to take Plaquenil 200 mg 1 tablet by mouth twice daily.  We will check ESR, CK, TSH, and vitamin D today. We will call her with the lab results. She will start on  Prednisone 10 mg po daily.    - Plan: hydroxychloroquine (PLAQUENIL) 200 MG tablet, CK, TSH, ESR, VITAMIN D 25, CBC, CMP  High risk medications (not anticoagulants) long-term use - Plaquenil 200 mg 1 tablet twice daily. PLQ Eye Exam: 04/05/2019 WNL @ [facility] Follow up in 1 year.  CBC and CMP will be drawn today. - Plan: CBC, CMP  Myalgia -She  has been experiencing increased myalgias and muscle tenderness over the past 1 month.  She has no obvious muscle weakness at this time.  We will obtain the following lab work today. Plan: CK, TSH, ESR, VITAMIN D 25, CBC, CMP  Primary osteoarthritis of both hands/ has had bilateral [facility] surgery: She has PIP and DIP synovial thickening consistent with osteoarthritis of both hands.  She has no tenderness or synovitis. Joint protection and muscle strengthening were discussed.  S/P TKR (total knee replacement), right: She has good ROM with no discomfort.  She was warmth but no effusion.   Primary osteoarthritis of left knee: She has good ROM with no discomfort.  No warmth or effusion noted.   Primary osteoarthritis of right shoulder:  She has limited ROM with discomfort.   Fibromyalgia  Trapezius muscle spasm: She has trapezius muscle tension and tenderness bilaterally.    History of repair of right rotator cuff: She has limited ROM of the right shoulder with discomfort.  She has been having increased difficulty raising both arms over the past 1 month.   DDD (degenerative disc disease), lumbar: Chronic pain.  She has painful and limited ROM of lumbar spine.  No symptoms of radiculopathy.   Other insomnia: She has interrupted sleep at night.   Other fatigue -She has been experiencing increased fatigue and myalgias over the past 1 month.  We will obtain the following lab work.  Plan: CK, TSH, ESR, VITAMIN D 25  Osteopenia of multiple sites -DEXA on 09/29/17 BMD at femur neck left is 0.851 with a T-score of -1.3.  We will check a vitamin D level today.  Plan: VITAMIN D 25  Other medical conditions are listed as follows:   History of hypertension  History of hyperlipidemia    Orders: Orders Placed This Encounter  Procedures  . CK  . TSH  . ESR  . VITAMIN D 25  . CBC  . CMP   Meds ordered this encounter  Medications  . hydroxychloroquine (PLAQUENIL) 200 MG tablet    Sig: Take 1 tablet (200 mg total) by mouth 2 (two) times daily.    Dispense:  180 tablet    Refill:  0    PMR  . predniSONE (DELTASONE) 5 MG tablet    Sig: Take 2 tablets (10 mg total) by mouth daily.    Dispense:  60 tablet    Refill:  0      Follow-Up Instructions: Return in about 4 weeks (around 08/22/2019) for Polymyalgia Rheumatica, Fibromyalgia, Osteoarthritis.   Ofilia Neas, PA-C  Note - This record has been created using Dragon software.  Chart creation errors have been sought, but may not always  have been located. Such creation errors do not reflect on  the standard of medical care.

## 2019-07-25 ENCOUNTER — Ambulatory Visit (INDEPENDENT_AMBULATORY_CARE_PROVIDER_SITE_OTHER): Payer: Medicare Other | Admitting: Physician Assistant

## 2019-07-25 ENCOUNTER — Encounter: Payer: Self-pay | Admitting: Physician Assistant

## 2019-07-25 ENCOUNTER — Other Ambulatory Visit: Payer: Self-pay

## 2019-07-25 VITALS — BP 133/81 | HR 76 | Resp 13 | Ht 68.0 in | Wt 187.2 lb

## 2019-07-25 DIAGNOSIS — M19011 Primary osteoarthritis, right shoulder: Secondary | ICD-10-CM

## 2019-07-25 DIAGNOSIS — Z79899 Other long term (current) drug therapy: Secondary | ICD-10-CM | POA: Diagnosis not present

## 2019-07-25 DIAGNOSIS — M797 Fibromyalgia: Secondary | ICD-10-CM

## 2019-07-25 DIAGNOSIS — M1712 Unilateral primary osteoarthritis, left knee: Secondary | ICD-10-CM

## 2019-07-25 DIAGNOSIS — Z9889 Other specified postprocedural states: Secondary | ICD-10-CM

## 2019-07-25 DIAGNOSIS — G4709 Other insomnia: Secondary | ICD-10-CM

## 2019-07-25 DIAGNOSIS — M19041 Primary osteoarthritis, right hand: Secondary | ICD-10-CM | POA: Diagnosis not present

## 2019-07-25 DIAGNOSIS — M353 Polymyalgia rheumatica: Secondary | ICD-10-CM | POA: Diagnosis not present

## 2019-07-25 DIAGNOSIS — Z96651 Presence of right artificial knee joint: Secondary | ICD-10-CM | POA: Diagnosis not present

## 2019-07-25 DIAGNOSIS — M62838 Other muscle spasm: Secondary | ICD-10-CM

## 2019-07-25 DIAGNOSIS — M5136 Other intervertebral disc degeneration, lumbar region: Secondary | ICD-10-CM

## 2019-07-25 DIAGNOSIS — Z8639 Personal history of other endocrine, nutritional and metabolic disease: Secondary | ICD-10-CM

## 2019-07-25 DIAGNOSIS — R5383 Other fatigue: Secondary | ICD-10-CM

## 2019-07-25 DIAGNOSIS — M19042 Primary osteoarthritis, left hand: Secondary | ICD-10-CM

## 2019-07-25 DIAGNOSIS — M51369 Other intervertebral disc degeneration, lumbar region without mention of lumbar back pain or lower extremity pain: Secondary | ICD-10-CM

## 2019-07-25 DIAGNOSIS — M791 Myalgia, unspecified site: Secondary | ICD-10-CM

## 2019-07-25 DIAGNOSIS — Z8679 Personal history of other diseases of the circulatory system: Secondary | ICD-10-CM

## 2019-07-25 DIAGNOSIS — M8589 Other specified disorders of bone density and structure, multiple sites: Secondary | ICD-10-CM

## 2019-07-25 MED ORDER — PREDNISONE 5 MG PO TABS: ORAL_TABLET | ORAL | 0 refills | Status: DC

## 2019-07-25 MED ORDER — HYDROXYCHLOROQUINE SULFATE 200 MG PO TABS: 200.0000 mg | ORAL_TABLET | Freq: Two times a day (BID) | ORAL | 0 refills | Status: DC

## 2019-07-26 LAB — CBC WITH DIFFERENTIAL/PLATELET
Absolute Monocytes: 511 cells/uL (ref 200–950)
Basophils Absolute: 51 cells/uL (ref 0–200)
Basophils Relative: 0.7 %
Eosinophils Absolute: 212 cells/uL (ref 15–500)
Eosinophils Relative: 2.9 %
HCT: 38.7 % (ref 35.0–45.0)
Hemoglobin: 12 g/dL (ref 11.7–15.5)
Lymphs Abs: 1913 cells/uL (ref 850–3900)
MCH: 25.7 pg — ABNORMAL LOW (ref 27.0–33.0)
MCHC: 31 g/dL — ABNORMAL LOW (ref 32.0–36.0)
MCV: 82.9 fL (ref 80.0–100.0)
MPV: 10.7 fL (ref 7.5–12.5)
Monocytes Relative: 7 %
Neutro Abs: 4614 cells/uL (ref 1500–7800)
Neutrophils Relative %: 63.2 %
Platelets: 290 10*3/uL (ref 140–400)
RBC: 4.67 10*6/uL (ref 3.80–5.10)
RDW: 15.4 % — ABNORMAL HIGH (ref 11.0–15.0)
Total Lymphocyte: 26.2 %
WBC: 7.3 10*3/uL (ref 3.8–10.8)

## 2019-07-26 LAB — COMPLETE METABOLIC PANEL WITH GFR
AG Ratio: 1.6 (calc) (ref 1.0–2.5)
ALT: 12 U/L (ref 6–29)
AST: 17 U/L (ref 10–35)
Albumin: 4.2 g/dL (ref 3.6–5.1)
Alkaline phosphatase (APISO): 80 U/L (ref 37–153)
BUN/Creatinine Ratio: 25 (calc) — ABNORMAL HIGH (ref 6–22)
BUN: 14 mg/dL (ref 7–25)
CO2: 28 mmol/L (ref 20–32)
Calcium: 9.4 mg/dL (ref 8.6–10.4)
Chloride: 108 mmol/L (ref 98–110)
Creat: 0.55 mg/dL — ABNORMAL LOW (ref 0.60–0.93)
GFR, Est African American: 109 mL/min/{1.73_m2} (ref >=60)
GFR, Est Non African American: 94 mL/min/{1.73_m2} (ref >=60)
Globulin: 2.6 g/dL (calc) (ref 1.9–3.7)
Glucose, Bld: 87 mg/dL (ref 65–99)
Potassium: 4.5 mmol/L (ref 3.5–5.3)
Sodium: 143 mmol/L (ref 135–146)
Total Bilirubin: 0.3 mg/dL (ref 0.2–1.2)
Total Protein: 6.8 g/dL (ref 6.1–8.1)

## 2019-07-26 LAB — TSH: TSH: 2.61 mIU/L (ref 0.40–4.50)

## 2019-07-26 LAB — CK: Total CK: 109 U/L (ref 29–143)

## 2019-07-26 LAB — VITAMIN D 25 HYDROXY (VIT D DEFICIENCY, FRACTURES): Vit D, 25-Hydroxy: 43 ng/mL (ref 30–100)

## 2019-07-26 LAB — SEDIMENTATION RATE: Sed Rate: 19 mm/h (ref 0–30)

## 2019-07-26 NOTE — Progress Notes (Signed)
She can take tylenol or NSAIDs for pain relief.

## 2019-07-26 NOTE — Progress Notes (Signed)
CBC and CMP stable.  Vitamin D is WNL.  ESR WNL.  CK WNL.  TSH WNL.   Labs are not consistent with a PMR flare.  She will not require the prescription for prednisone.

## 2019-07-26 NOTE — Progress Notes (Signed)
We can also refer her to physical therapy if she would like.

## 2019-07-30 ENCOUNTER — Telehealth: Payer: Self-pay | Admitting: Rheumatology

## 2019-07-30 DIAGNOSIS — M353 Polymyalgia rheumatica: Secondary | ICD-10-CM

## 2019-07-30 MED ORDER — HYDROXYCHLOROQUINE SULFATE 200 MG PO TABS: 200.0000 mg | ORAL_TABLET | Freq: Two times a day (BID) | ORAL | 0 refills | Status: DC

## 2019-07-30 NOTE — Telephone Encounter (Signed)
Patient called stating her prescription of Plaquenil was sent to [facility], but it needs to be sent to [facility].

## 2019-07-30 NOTE — Telephone Encounter (Signed)
Prescription resent to the [facility].

## 2019-08-13 ENCOUNTER — Telehealth: Payer: Self-pay | Admitting: Rheumatology

## 2019-08-13 NOTE — Telephone Encounter (Signed)
Patient left a voicemail requesting a copy of her labwork results be sent to Dr. Chalmers Cater at [facility].

## 2019-08-14 NOTE — Telephone Encounter (Signed)
Lab results from 07/25/2019 have been forwarded to Dr. Chalmers Cater at [facility].

## 2019-08-15 ENCOUNTER — Telehealth: Payer: Self-pay | Admitting: Rheumatology

## 2019-08-15 DIAGNOSIS — M353 Polymyalgia rheumatica: Secondary | ICD-10-CM

## 2019-08-15 MED ORDER — HYDROXYCHLOROQUINE SULFATE 200 MG PO TABS: 200.0000 mg | ORAL_TABLET | Freq: Two times a day (BID) | ORAL | 0 refills | Status: DC

## 2019-08-15 NOTE — Telephone Encounter (Signed)
Spoke with patient and I assisted patient in tracking her medication with number she was provided by [facility]. Per [employer]PS tracking, her medication is in [place] at the [facility] and has been there since 08/13/2019. I advised patient I would send a 1 week supply to local pharmacy so she is not without medication, patient verbalized understanding.

## 2019-08-15 NOTE — Telephone Encounter (Signed)
Patient calling because generic Plaquenil was mailed out on the 08/03/2019, but patient still has not received medication. Patient is completely out. What should she do?

## 2019-08-23 ENCOUNTER — Ambulatory Visit: Payer: Medicare Other | Admitting: Rheumatology

## 2019-08-30 ENCOUNTER — Other Ambulatory Visit: Payer: Self-pay | Admitting: Neurosurgery

## 2019-08-30 DIAGNOSIS — M47816 Spondylosis without myelopathy or radiculopathy, lumbar region: Secondary | ICD-10-CM

## 2019-08-31 ENCOUNTER — Telehealth: Payer: Self-pay

## 2019-08-31 NOTE — Telephone Encounter (Signed)
Labs/office note received via fax from Dr. Chalmers Cater.   08/27/2019 BMP, A1c, statin panel, TSH, vitamin b12, glycated Hgb.  Sodium 146, chloride 108.  Labs reviewed by Hazel Sams, PA-C. Will send to scan center.

## 2019-09-12 ENCOUNTER — Other Ambulatory Visit: Payer: Self-pay

## 2019-09-12 ENCOUNTER — Ambulatory Visit
Admission: RE | Admit: 2019-09-12 | Discharge: 2019-09-12 | Disposition: A | Discharge status: Home / Self Care | Payer: Medicare Other | Source: Ambulatory Visit | Attending: Neurosurgery | Admitting: Neurosurgery

## 2019-09-12 DIAGNOSIS — M47816 Spondylosis without myelopathy or radiculopathy, lumbar region: Secondary | ICD-10-CM

## 2019-09-12 MED ORDER — METHYLPREDNISOLONE ACETATE 40 MG/ML INJ SUSP (RADIOLOG
120.0000 mg | Freq: Once | INTRAMUSCULAR | Status: AC
  Administered 2019-09-12: 120 mg via EPIDURAL

## 2019-09-12 MED ORDER — IOPAMIDOL (ISOVUE-M 200) INJECTION 41%
1.0000 mL | Freq: Once | INTRAMUSCULAR | Status: AC
  Administered 2019-09-12: 1 mL via EPIDURAL

## 2019-09-17 ENCOUNTER — Telehealth: Payer: Self-pay | Admitting: Rheumatology

## 2019-09-17 DIAGNOSIS — M353 Polymyalgia rheumatica: Secondary | ICD-10-CM

## 2019-09-17 NOTE — Telephone Encounter (Signed)
Please schedule patient for a follow up visit. Patient was due December 2020. Thanks! 

## 2019-09-17 NOTE — Telephone Encounter (Signed)
Patient canceled follow up appointment in December because she wants to be seen in person. Patient to call back to reschedule once office reopens to in office appointments.

## 2019-09-17 NOTE — Telephone Encounter (Deleted)
Last Visit: 07/25/19 Next Visit was due December 2020. Message sent to the front to schedule patient.  Labs: 07/25/19 stable  PLQ Eye Exam: 04/05/2019 WNL  Okay to refill per Dr. Corliss Skains

## 2019-09-19 ENCOUNTER — Telehealth: Payer: Self-pay | Admitting: Obstetrics and Gynecology

## 2019-09-19 NOTE — Telephone Encounter (Signed)
Call placed to patient to review benefit and schedule yearly ultrasound with Dr Edward Jolly. Left voicemail message requesting a return call.

## 2019-09-25 NOTE — Telephone Encounter (Signed)
Spoke with patient in regards to benefit for yearly ultrasound. Patient acknowledges understanding of information presented. Patient is scheduled 09/27/2019 with Dr Edward Jolly. Patient is aware of the appointment date, arrival time and cancellation policy. No further questions. Will close encounter

## 2019-09-26 ENCOUNTER — Other Ambulatory Visit: Payer: Self-pay

## 2019-09-27 ENCOUNTER — Ambulatory Visit (INDEPENDENT_AMBULATORY_CARE_PROVIDER_SITE_OTHER): Payer: Medicare Other | Admitting: Obstetrics and Gynecology

## 2019-09-27 ENCOUNTER — Ambulatory Visit (INDEPENDENT_AMBULATORY_CARE_PROVIDER_SITE_OTHER): Payer: Medicare Other

## 2019-09-27 ENCOUNTER — Encounter: Payer: Self-pay | Admitting: Obstetrics and Gynecology

## 2019-09-27 VITALS — BP 120/82 | HR 70 | Temp 96.5°F | Ht 68.0 in | Wt 188.4 lb

## 2019-09-27 DIAGNOSIS — N83202 Unspecified ovarian cyst, left side: Secondary | ICD-10-CM

## 2019-09-27 DIAGNOSIS — N83201 Unspecified ovarian cyst, right side: Secondary | ICD-10-CM | POA: Diagnosis not present

## 2019-09-27 DIAGNOSIS — R159 Full incontinence of feces: Secondary | ICD-10-CM | POA: Diagnosis not present

## 2019-09-27 NOTE — Progress Notes (Signed)
GYNECOLOGY  VISIT   HPI: 74 y.o.   Married  Caucasian  female   [phone] with Patient's last menstrual period was 09/13/1984 (approximate).   here for pelvic ultrasound.   Patient is followed for a left ovarian cyst with a few scattered echoes.   Pelvic US on 09/21/18: Uterus absent.  Right ovary not seen due to bowel gas.  Left ovary with 26 mm cyst, echofree and with normal blood flow.  No significant change in size.  No free fluid.    CA125 on 09/21/18:  10.4.  She is having ongoing fecal incontinence.  She is experiencing this after she has had a BM, sometime during the same day.  She really wants help with this.  She has had back surgery and wondering if this is related to her problem.  Having skin and nail issues.  Hair loss and nails crumbling.  She has seen dermatology.   GYNECOLOGIC HISTORY: Patient's last menstrual period was 09/13/1984 (approximate). Contraception: PMP Menopausal hormone therapy: none Last mammogram: 05-17-19 3D/Neg/density B/BiRads1 Last pap smear: 07-26-18 Neg,06-27-17 Neg, 06-03-16 ASCUS:Neg HR HPV        OB History    Gravida  3   Para  3   Term  3   Preterm  0   AB  0   Living  3     SAB  0   TAB  0   Ectopic  0   Multiple  0   Live Births  3              Patient Active Problem List   Diagnosis Date Noted  . Primary localized osteoarthritis of right knee 10/19/2018  . Right foot pain 10/19/2018  . Incontinence of feces 09/21/2018  . Vulvar lesion 07/20/2018  . History of repair of right rotator cuff 03/03/2017  . Primary osteoarthritis of both hands/ has had bilateral [place] surgery  03/03/2017  . History of hypertension 03/03/2017  . History of hyperlipidemia 03/03/2017  . DDD (degenerative disc disease), lumbar/ history of lumbar surgery  03/03/2017  . Osteopenia of multiple sites 03/03/2017  . Chronic left shoulder pain 01/20/2017  . Primary osteoarthritis of both knees 01/20/2017  . Trapezius muscle spasm 01/20/2017   . Insomnia 01/20/2017  . Fatigue 01/20/2017  . Fibromyalgia 01/20/2017  . High risk medications (not anticoagulants) long-term use 01/20/2017  . PMR (polymyalgia rheumatica) (HCC) 01/20/2017  . Left ovarian cyst 06/02/2015  . Degenerative joint disease, shoulder, right 08/02/2013  . Precordial pain 02/11/2012  . HTN (hypertension) 02/11/2012  . Hyperlipidemia 02/11/2012    Past Medical History:  Diagnosis Date  . Bilateral ovarian cysts 09/12/2014   bilateral simple ovarian cysts.  [place] due in January 2018.  . 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 Fibromyalgia   . HTN (hypertension)    under control with meds., has been on med. x 20 yr.  . Hyperlipidemia   . Hypothyroid   . IBS (irritable bowel syndrome)   . Impingement syndrome of right shoulder 07/2013  . Polymyalgia rheumatica (HCC)    5-28  states she has been on prednisone and now is trying to only be on plaquenil to manage the polymyalgia   . PONV (postoperative nausea and vomiting)   . Primary localized osteoarthritis of right knee   . Rupture of right rotator cuff 07/2013  . VIN I (vulvar intraepithelial neoplasia I) 2017, 2018   this is from a perineal/vulvar biopsy.  . Vitamin B12 deficiency  Past Surgical History:  Procedure Laterality Date  . ANTERIOR LAT LUMBAR FUSION  10/08/2008   L3-4  . BACK SURGERY    . BACK SURGERY  10-03-14   --Dr. Trey Sailors at [facility]  . COLONOSCOPY W/ BIOPSIES  11/2009   polyps  . COLONOSCOPY W/ BIOPSIES  11/2012   polyp, recheck in 3 years  . CYSTO  07/05/2006  . CYSTOCELE REPAIR  07/05/2006  . ESOPHAGOGASTRODUODENOSCOPY ENDOSCOPY  05/25/05   erythema and edema of gastric lumen  . EYE SURGERY Right    muscle adjustment   . FOOT SURGERY Right   . GANGLION CYST EXCISION Right    wrist  . INCONTINENCE SURGERY  07/05/2006   cystourethropexy  . ORIF FOREARM FRACTURE Right 09/2012  . SHOULDER ARTHROSCOPY WITH ROTATOR CUFF REPAIR AND SUBACROMIAL  DECOMPRESSION Right 08/02/2013   Procedure: SHOULDER ARTHROSCOPY WITH ROTATOR CUFF REPAIR AND SUBACROMIAL DECOMPRESSION, EXTENSIVE DEBRIDEMENT (INCLUDING LABRUM), PARTIAL ACROMIPLASY WITH CORACOACROMIAL RELEASE, DISTAL CLAVICULECTOMY;  Surgeon: Loreta Ave, MD;  Location: [facility];  Service: Orthopedics;  Laterality: Right;  . STRABISMUS SURGERY Right 01/26/2008   2 other surgeries previously  . STRABISMUS SURGERY Right 06/03/2017   Procedure: REPAIR STRABISMUS RIGHT EYE;  Surgeon: Verne Carrow, MD;  Location: [facility];  Service: Ophthalmology;  Laterality: Right;  . Thumb surgery  2/13 & ?2010   Bilateral - states a joint was replaced  . TOTAL KNEE ARTHROPLASTY Right 02/12/2019   Procedure: TOTAL KNEE ARTHROPLASTY;  Surgeon: Salvatore Marvel, MD;  Location: [facility];  Service: Orthopedics;  Laterality: Right;  . TOTAL VAGINAL HYSTERECTOMY  1986 - or age 46   secondary AUB  . TUBAL LIGATION     laparosopic BTL age 18  . varicose veins Bilateral     Current Outpatient Medications  Medication Sig Dispense Refill  . alendronate (FOSAMAX) 70 MG tablet Take 70 mg by mouth once a week.    . benazepril (LOTENSIN) 40 MG tablet Take 40 mg by mouth daily.     . Calcium Carb-Cholecalciferol (CALCIUM 600 + D PO) Take 1 tablet by mouth daily.    . diclofenac sodium (VOLTAREN) 1 % GEL Apply 3 gm to 3 large joints up to 3 times a day.Dispense 3 tubes with 3 refills. (Patient taking differently: Apply 1 application topically 3 (three) times daily as needed (PAIN). Apply 3 gm to 3 large joints up to 3 times a day.Dispense 3 tubes with 3 refills.) 3 Tube 1  . hydroxychloroquine (PLAQUENIL) 200 MG tablet Take 1 tablet (200 mg total) by mouth 2 (two) times daily. (Patient taking differently: Take 200 mg by mouth daily. ) 14 tablet 0  . levothyroxine (SYNTHROID) 125 MCG tablet Take 125 mcg by mouth daily.    . magnesium gluconate (MAGONATE) 500 MG tablet Take 500 mg by mouth as  needed.     . Multiple Vitamin (MULTIVITAMIN) tablet Take 1 tablet by mouth daily.    . Omega-3 Fatty Acids (FISH OIL) 1000 MG CAPS Take 1 capsule by mouth daily.    Marland Kitchen OVER THE COUNTER MEDICATION Take 1 tablet by mouth daily. Focus Factor    . tacrolimus (PROTOPIC) 0.1 % ointment APPLY ONCE A DAY EXTERNALLY    . vitamin B-12 (CYANOCOBALAMIN) 100 MCG tablet Take 100 mcg by mouth daily.    Marland Kitchen terbinafine (LAMISIL) 250 MG tablet Take 250 mg by mouth daily.     No current facility-administered medications for this visit.     ALLERGIES:  Penicillins and Sulfa antibiotics  Family History  Problem Relation Age of Onset  . Aortic aneurysm Father   . Hypertension Father   . Coronary artery disease Mother 21  . Hypertension Mother   . Hyperlipidemia Brother        brain and kidney tumor, no treatment needed     Social History   Socioeconomic History  . Marital status: Married    Spouse name: Not on file  . Number of children: 3  . Years of education: 67  . Highest education level: Not on file  Occupational History  . Occupation: retired  Tobacco Use  . Smoking status: Never Smoker  . Smokeless tobacco: Never Used  Substance and Sexual Activity  . Alcohol use: No    Alcohol/week: 0.0 standard drinks  . Drug use: No  . Sexual activity: Not Currently    Partners: Male    Birth control/protection: Surgical    Comment: Hyst  Other Topics Concern  . Not on file  Social History Narrative   Lives at home with husband . One story home. Right handed   Social Determinants of Health   Financial Resource Strain:   . Difficulty of Paying Living Expenses: Not on file  Food Insecurity:   . Worried About Programme researcher, broadcasting/film/video in the Last Year: Not on file  . Ran Out of Food in the Last Year: Not on file  Transportation Needs:   . Lack of Transportation (Medical): Not on file  . Lack of Transportation (Non-Medical): Not on file  Physical Activity:   . Days of Exercise per Week: Not on  file  . Minutes of Exercise per Session: Not on file  Stress:   . Feeling of Stress : Not on file  Social Connections:   . Frequency of Communication with Friends and Family: Not on file  . Frequency of Social Gatherings with Friends and Family: Not on file  . Attends Religious Services: Not on file  . Active Member of Clubs or Organizations: Not on file  . Attends Banker Meetings: Not on file  . Marital Status: Not on file  Intimate Partner Violence:   . Fear of Current or Ex-Partner: Not on file  . Emotionally Abused: Not on file  . Physically Abused: Not on file  . Sexually Abused: Not on file    Review of Systems  All other systems reviewed and are negative.   PHYSICAL EXAMINATION:    Ht 5\' 8"  (1.727 m)   LMP 09/13/1984 (Approximate)   BMI 28.46 kg/m     General appearance: alert, cooperative and appears stated age   Pelvic 11/11/1984 Uterus absent.  Right ovary with 10 and 5 mm cysts.  Left ovary with 2 cysts:  37 x 25 x 31 mm cyst and 7 mm cyst.  Echogenic material.  No abnormal blood flow.  No free fluid.  ASSESSMENT  Bilateral ovarian cysts.  Some fluctuation in left ovarian cyst(s) compared to prior US findings.  At times the left ovarian cyst is larger and smaller. Fecal incontinence.   PLAN  We discussed her ovarian cysts and their benign nature.  Will check CA125.  Return for next pelvic US in 6 months.  Referral to Dr. [place] for fecal incontinence.    An After Visit Summary was printed and given to the patient.  __20____ minutes face to face time of which over 50% was spent in counseling.

## 2019-09-27 NOTE — Progress Notes (Signed)
Encounter reviewed by Dr. Orian Amberg Amundson C. Silva.  

## 2019-09-28 ENCOUNTER — Telehealth: Payer: Self-pay | Admitting: Obstetrics and Gynecology

## 2019-09-28 LAB — CA 125: Cancer Antigen (CA) 125: 7.8 U/mL (ref 0.0–38.1)

## 2019-09-28 NOTE — Telephone Encounter (Signed)
Call placed to convey referral information for Dr. Byrd Hesselbach. Spoke with patient and conveyed the appointment details. Appointment scheduled 10/16/19@930  at the [facility] location.

## 2019-11-07 ENCOUNTER — Other Ambulatory Visit: Payer: Self-pay | Admitting: Rheumatology

## 2019-11-07 DIAGNOSIS — M353 Polymyalgia rheumatica: Secondary | ICD-10-CM

## 2019-11-07 NOTE — Telephone Encounter (Signed)
Please schedule patient for a follow up visit. Patient was due December 2020. Thanks! 

## 2019-11-07 NOTE — Telephone Encounter (Signed)
Last Visit: 07/25/19 Next Visit: was due December 2020. Message sent to the front to schedule Labs: 08/27/19 Sodium 146, chloride 108. Eye exam: 04/05/2019 WNL   Okay to refill per Dr. Corliss Skains

## 2019-11-15 ENCOUNTER — Ambulatory Visit: Payer: Medicare Other | Attending: Internal Medicine

## 2019-11-15 DIAGNOSIS — Z23 Encounter for immunization: Secondary | ICD-10-CM | POA: Insufficient documentation

## 2019-11-15 NOTE — Progress Notes (Signed)
   Covid-19 Vaccination Clinic  Name:  DENISSE WHITENACK    MRN: [id number] DOB: 08-10-1946  11/15/2019  Ms. Gillin was observed post Covid-19 immunization for 15 minutes without incident. She was provided with Vaccine Information Sheet and instruction to access the V-Safe system.   Ms. Alire was instructed to call 911 with any severe reactions post vaccine: Marland Kitchen Difficulty breathing  . Swelling of face and throat  . A fast heartbeat  . A bad rash all over body  . Dizziness and weakness   Immunizations Administered    Name Date Dose VIS Date Route   Pfizer COVID-19 Vaccine 11/15/2019  9:24 AM 0.3 mL 08/24/2019 Intramuscular   Manufacturer: [employer], [employer]   Lot: [id number]   NDC: [address]-6

## 2019-11-26 NOTE — Progress Notes (Signed)
Office Visit Note  Patient: [name]             Date of Birth: 1946-02-03           MRN: [id number]             PCP: Leonard Downing, MD Referring: Leonard Downing, * Visit Date: 12/05/2019 Occupation: _0 @  Subjective:  Generalized pain   History of Present Illness: [name] is a 74 y.o. female with history of PMR, osteoarthritis, and fibromyalgia.  Patient is taking Plaquenil 200 mg 1 tablet twice daily.  She reports that she tried reducing the dose to 1 tablet daily due to hair loss but resumed taking twice daily this week.  She says she has not noticed much improvement since starting on Plaquenil.  She states she continues to take prednisone as needed when she is experiencing increased discomfort.  She denies any joint swelling at this time.  She continues to have chronic pain in both shoulder joints.  She has trochanteric bursitis bilaterally.  She continues to have chronic lower back pain.  She had an epidural injection at [facility] in December which provided significant relief.  She denies any symptoms of radiculopathy.  She declined physical therapy in the past due to not thinking of provide any benefit.  She states that her right knee replacement is doing well at this time.  She continues have generalized muscle aches and muscle tenderness due to fibromyalgia.     Activities of Daily Living:  Patient reports joint stiffness all day  Patient Reports nocturnal pain.  Difficulty dressing/grooming: Denies Difficulty climbing stairs: Reports Difficulty getting out of chair: Reports Difficulty using hands for taps, buttons, cutlery, and/or writing: Denies  Review of Systems  Constitutional: Negative for fatigue.  HENT: Negative for mouth sores, mouth dryness and nose dryness.   Eyes: Negative for pain, itching, visual disturbance and dryness.  Respiratory: Negative for cough, hemoptysis, shortness of breath, wheezing and difficulty  breathing.   Cardiovascular: Negative for chest pain, palpitations, hypertension and swelling in legs/feet.  Gastrointestinal: Negative for blood in stool, constipation and diarrhea.  Endocrine: Negative for increased urination.  Genitourinary: Negative for difficulty urinating and painful urination.  Musculoskeletal: Positive for arthralgias, joint pain, myalgias, morning stiffness, muscle tenderness and myalgias. Negative for joint swelling and muscle weakness.  Skin: Positive for rash and hair loss. Negative for color change, pallor, nodules/bumps, skin tightness, ulcers and sensitivity to sunlight.  Allergic/Immunologic: Negative for susceptible to infections.  Neurological: Positive for headaches. Negative for dizziness, numbness, memory loss and weakness.  Hematological: Negative for bruising/bleeding tendency and swollen glands.  Psychiatric/Behavioral: Negative for depressed mood, confusion and sleep disturbance. The patient is not nervous/anxious.     PMFS History:  Patient Active Problem List   Diagnosis Date Noted  . Primary localized osteoarthritis of right knee 10/19/2018  . Right foot pain 10/19/2018  . Incontinence of feces 09/21/2018  . Vulvar lesion 07/20/2018  . History of repair of right rotator cuff 03/03/2017  . Primary osteoarthritis of both hands/ has had bilateral [place] surgery  03/03/2017  . History of hypertension 03/03/2017  . History of hyperlipidemia 03/03/2017  . DDD (degenerative disc disease), lumbar/ history of lumbar surgery  03/03/2017  . Osteopenia of multiple sites 03/03/2017  . Chronic left shoulder pain 01/20/2017  . Primary osteoarthritis of both knees 01/20/2017  . Trapezius muscle spasm 01/20/2017  . Insomnia 01/20/2017  . Fatigue 01/20/2017  . Fibromyalgia 01/20/2017  .  High risk medications (not anticoagulants) long-term use 01/20/2017  . PMR (polymyalgia rheumatica) (HCC) 01/20/2017  . Left ovarian cyst 06/02/2015  . Degenerative joint  disease, shoulder, right 08/02/2013  . Precordial pain 02/11/2012  . HTN (hypertension) 02/11/2012  . Hyperlipidemia 02/11/2012    Past Medical History:  Diagnosis Date  . Bilateral ovarian cysts 09/12/2014   bilateral simple ovarian cysts.  [place] due in January 2018.  . 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 Fibromyalgia   . HTN (hypertension)    under control with meds., has been on med. x 20 yr.  . Hyperlipidemia   . Hypothyroid   . IBS (irritable bowel syndrome)   . Impingement syndrome of right shoulder 07/2013  . Polymyalgia rheumatica (HCC)    5-28  states she has been on prednisone and now is trying to only be on plaquenil to manage the polymyalgia   . PONV (postoperative nausea and vomiting)   . Primary localized osteoarthritis of right knee   . Rupture of right rotator cuff 07/2013  . VIN I (vulvar intraepithelial neoplasia I) 2017, 2018   this is from a perineal/vulvar biopsy.  . Vitamin B12 deficiency     Family History  Problem Relation Age of Onset  . Aortic aneurysm Father   . Hypertension Father   . Coronary artery disease Mother 18  . Hypertension Mother   . Hyperlipidemia Brother        brain and kidney tumor, no treatment needed   . Aneurysm Brother   . Healthy Daughter   . Healthy Daughter   . Healthy Daughter    Past Surgical History:  Procedure Laterality Date  . ANTERIOR LAT LUMBAR FUSION  10/08/2008   L3-4  . BACK SURGERY    . BACK SURGERY  10-03-14   --Dr. Glenna Fellows at [facility]  11/2009   polyps  . COLONOSCOPY W/ BIOPSIES  11/2012   polyp, recheck in 3 years  . CYSTO  07/05/2006  . CYSTOCELE REPAIR  07/05/2006  . ESOPHAGOGASTRODUODENOSCOPY ENDOSCOPY  05/25/05   erythema and edema of gastric lumen  . EYE SURGERY Right    muscle adjustment   . FOOT SURGERY Right   . GANGLION CYST EXCISION Right    wrist  . INCONTINENCE SURGERY  07/05/2006   cystourethropexy  . ORIF FOREARM FRACTURE Right  09/2012  . SHOULDER ARTHROSCOPY WITH ROTATOR CUFF REPAIR AND SUBACROMIAL DECOMPRESSION Right 08/02/2013   Procedure: SHOULDER ARTHROSCOPY WITH ROTATOR CUFF REPAIR AND SUBACROMIAL DECOMPRESSION, EXTENSIVE DEBRIDEMENT (INCLUDING LABRUM), PARTIAL ACROMIPLASY WITH CORACOACROMIAL RELEASE, DISTAL CLAVICULECTOMY;  Surgeon: Ninetta Lights, MD;  Location: [facility];  Service: Orthopedics;  Laterality: Right;  . STRABISMUS SURGERY Right 01/26/2008   2 other surgeries previously  . STRABISMUS SURGERY Right 06/03/2017   Procedure: REPAIR STRABISMUS RIGHT EYE;  Surgeon: Everitt Amber, MD;  Location: [facility];  Service: Ophthalmology;  Laterality: Right;  . Thumb surgery  2/13 & ?2010   Bilateral - states a joint was replaced  . TOTAL KNEE ARTHROPLASTY Right 02/12/2019   Procedure: TOTAL KNEE ARTHROPLASTY;  Surgeon: Elsie Saas, MD;  Location: [facility];  Service: Orthopedics;  Laterality: Right;  . TOTAL VAGINAL HYSTERECTOMY  1986 - or age 53   secondary AUB  . TUBAL LIGATION     laparosopic BTL age 50  . varicose veins Bilateral    Social History   Social History Narrative   Lives at home with husband .  One story home. Right handed   Immunization History  Administered Date(s) Administered  . Influenza-Unspecified 08/04/2017  . PFIZER SARS-COV-2 Vaccination 11/15/2019  . Pneumococcal Conjugate-13 05/14/2014, 05/15/2015  . Tdap 09/13/2012     Objective: Vital Signs: BP 131/81 (BP Location: Left Arm, Patient Position: Sitting, Cuff Size: Normal)   Pulse 73   Resp 15   Ht 5' 8" (1.727 m)   Wt 197 lb 3.2 oz (89.4 kg)   LMP 09/13/1984 (Approximate)   BMI 29.98 kg/m    Physical Exam Vitals and nursing note reviewed.  Constitutional:      Appearance: She is well-developed.  HENT:     Head: Normocephalic and atraumatic.  Eyes:     Conjunctiva/sclera: Conjunctivae normal.  Pulmonary:     Effort: Pulmonary effort is normal.  Abdominal:     General: Bowel  sounds are normal.     Palpations: Abdomen is soft.  Musculoskeletal:     Cervical back: Normal range of motion.  Lymphadenopathy:     Cervical: No cervical adenopathy.  Skin:    General: Skin is warm and dry.     Capillary Refill: Capillary refill takes less than 2 seconds.  Neurological:     Mental Status: She is alert and oriented to person, place, and time.  Psychiatric:        Behavior: Behavior normal.      Musculoskeletal Exam: C-spine limited ROM.  Thoracic kyphosis noted.  Limited and painful ROM of lumbar spine.  Shoulder joints good ROM with discomfort bilaterally.  Full strength of upper extremities.  Elbow joints good ROM with no tenderness.  Wrist joints have slightly limited ROM. MCPs, PIPs, and DIPs good ROM with no synovitis.  Ulnar styloid prominence bilaterally.  PIP and DIP thickening consistent with osteoarthritis of both hands.  Hip joints have good range of motion.  Right knee has good range of motion with warmth.  Left knee has good range of motion with no warmth or effusion.  She has left knee crepitus.  Ankle joints have good range of motion with no discomfort.  She has pedal edema bilaterally.  CDAI Exam: CDAI Score: -- Patient Global: --; Provider Global: -- Swollen: --; Tender: -- Joint Exam 12/05/2019   No joint exam has been documented for this visit   There is currently no information documented on the homunculus. Go to the Rheumatology activity and complete the homunculus joint exam.  Investigation: No additional findings.  Imaging: No results found.  Recent Labs: Lab Results  Component Value Date   WBC 7.3 07/25/2019   HGB 12.0 07/25/2019   PLT 290 07/25/2019   NA 143 07/25/2019   K 4.5 07/25/2019   CL 108 07/25/2019   CO2 28 07/25/2019   GLUCOSE 87 07/25/2019   BUN 14 07/25/2019   CREATININE 0.55 (L) 07/25/2019   BILITOT 0.3 07/25/2019   ALKPHOS 73 02/08/2019   AST 17 07/25/2019   ALT 12 07/25/2019   PROT 6.8 07/25/2019   ALBUMIN  4.1 02/08/2019   CALCIUM 9.4 07/25/2019   GFRAA 109 07/25/2019    Speciality Comments: PLQ Eye Exam: 04/05/2019 WNL @ [facility] Follow up in 1 year  Procedures:  No procedures performed Allergies: Penicillins and Sulfa antibiotics   Assessment / Plan:     Visit Diagnoses: PMR (polymyalgia rheumatica) ([place]): She has not had any signs or symptoms of a polymyalgia rheumatica flare recently.  ESR was 19 on 07/25/2019.  She experiences occasional myalgias related to fibromyalgia flares.  She is not experiencing any muscle weakness at this time.  She has some discomfort getting up from a seated position due to discomfort in her knee joints and hip joints.  She takes prednisone as needed during flares.  She is taking Plaquenil 200 mg 1 tablet twice daily.  She will continue on the current treatment regimen.  She was advised to notify us if she develops signs or symptoms of a flare.  She will follow-up in the office in 5 months.  High risk medications (not anticoagulants) long-term use - Plaquenil 200 mg 1 tablet twice daily. PLQ Eye Exam: 04/05/2019.  CBC and CMP were drawn today to monitor for drug toxicity.- Plan: CBC with Differential/Platelet, COMPLETE METABOLIC PANEL WITH GFR  Primary osteoarthritis of both hands/ has had bilateral [facility] surgery: She has PIP and DIP thickening consistent with osteoarthritis of both hands.  No synovitis was noted.  She has complete fist formation bilaterally.  Joint protection and muscle strengthening were discussed.  S/P TKR (total knee replacement), right: Doing well.  She has good range of motion.  Warmth was noted on exam today.  Primary osteoarthritis of left knee: She has good range of motion of the left knee joint on exam.  Left knee crepitus was noted.  No warmth or effusion was noted.  Primary osteoarthritis of right shoulder: She has good range of motion on exam today.  She has intermittent discomfort in the right shoulder joint.  Trapezius muscle  spasm: She experiences intermittent trapezius muscle tension and muscle tenderness.  She has muscle spasms on occasion.  She had trigger point injections on 03/22/2019 which provided significant pain relief.  Fibromyalgia: She has generalized hyperalgesia and positive tender points on exam.  She continues to have frequent fibromyalgia flares.  She has generalized muscle aches and muscle tenderness due to fibromyalgia.  She continues to have chronic fatigue related to insomnia.  We discussed importance of regular exercise and good sleep hygiene.  History of repair of right rotator cuff: Chronic pain.  She has good ROM with discomfort.   DDD (degenerative disc disease), lumbar: Chronic pain.  She had an epidural injection performed at [facility] in December which provided significant relief.  She is not experiencing any symptoms of radiculopathy.  Other insomnia: She has interrupted sleep at night due to nocturnal pain.   Other fatigue: Chronic and related to insomnia.   Osteopenia of multiple sites - DEXA on 09/29/17 BMD at femur neck left is 0.851 with a T-score of -1.3.  We discussed that she is due to to update DEXA.  She was advised to contact Dr. Chalmers Cater to place the order.  She is taking Fosamax 70 mg 1 tablet by mouth daily.  She continues take a calcium and vitamin D supplement.  Other medical conditions are listed as follows:   History of hyperlipidemia  History of hypertension  Orders: Orders Placed This Encounter  Procedures  . CBC with Differential/Platelet  . COMPLETE METABOLIC PANEL WITH GFR   No orders of the defined types were placed in this encounter.    Follow-Up Instructions: Return in about 5 months (around 05/06/2020) for Polymyalgia Rheumatica, Osteoarthritis, Fibromyalgia.   Ofilia Neas, PA-C  Note - This record has been created using Dragon software.  Chart creation errors have been sought, but may not always  have been located. Such creation errors do  not reflect on  the standard of medical care.

## 2019-12-05 ENCOUNTER — Ambulatory Visit: Payer: Medicare Other | Admitting: Physician Assistant

## 2019-12-05 ENCOUNTER — Encounter: Payer: Self-pay | Admitting: Physician Assistant

## 2019-12-05 ENCOUNTER — Other Ambulatory Visit: Payer: Self-pay

## 2019-12-05 VITALS — BP 131/81 | HR 73 | Resp 15 | Ht 68.0 in | Wt 197.2 lb

## 2019-12-05 DIAGNOSIS — M19041 Primary osteoarthritis, right hand: Secondary | ICD-10-CM

## 2019-12-05 DIAGNOSIS — M19042 Primary osteoarthritis, left hand: Secondary | ICD-10-CM

## 2019-12-05 DIAGNOSIS — M62838 Other muscle spasm: Secondary | ICD-10-CM

## 2019-12-05 DIAGNOSIS — M5136 Other intervertebral disc degeneration, lumbar region: Secondary | ICD-10-CM

## 2019-12-05 DIAGNOSIS — M19011 Primary osteoarthritis, right shoulder: Secondary | ICD-10-CM

## 2019-12-05 DIAGNOSIS — Z79899 Other long term (current) drug therapy: Secondary | ICD-10-CM | POA: Diagnosis not present

## 2019-12-05 DIAGNOSIS — M353 Polymyalgia rheumatica: Secondary | ICD-10-CM | POA: Diagnosis not present

## 2019-12-05 DIAGNOSIS — Z96651 Presence of right artificial knee joint: Secondary | ICD-10-CM

## 2019-12-05 DIAGNOSIS — R5383 Other fatigue: Secondary | ICD-10-CM

## 2019-12-05 DIAGNOSIS — G4709 Other insomnia: Secondary | ICD-10-CM

## 2019-12-05 DIAGNOSIS — Z8679 Personal history of other diseases of the circulatory system: Secondary | ICD-10-CM

## 2019-12-05 DIAGNOSIS — M797 Fibromyalgia: Secondary | ICD-10-CM

## 2019-12-05 DIAGNOSIS — M8589 Other specified disorders of bone density and structure, multiple sites: Secondary | ICD-10-CM

## 2019-12-05 DIAGNOSIS — M51369 Other intervertebral disc degeneration, lumbar region without mention of lumbar back pain or lower extremity pain: Secondary | ICD-10-CM

## 2019-12-05 DIAGNOSIS — Z8639 Personal history of other endocrine, nutritional and metabolic disease: Secondary | ICD-10-CM

## 2019-12-05 DIAGNOSIS — Z9889 Other specified postprocedural states: Secondary | ICD-10-CM

## 2019-12-05 DIAGNOSIS — M1712 Unilateral primary osteoarthritis, left knee: Secondary | ICD-10-CM

## 2019-12-05 LAB — CBC WITH DIFFERENTIAL/PLATELET
Absolute Monocytes: 458 cells/uL (ref 200–950)
Basophils Absolute: 41 cells/uL (ref 0–200)
Basophils Relative: 0.7 %
Eosinophils Absolute: 220 cells/uL (ref 15–500)
Eosinophils Relative: 3.8 %
HCT: 35.9 % (ref 35.0–45.0)
Hemoglobin: 11.4 g/dL — ABNORMAL LOW (ref 11.7–15.5)
Lymphs Abs: 1914 cells/uL (ref 850–3900)
MCH: 26.3 pg — ABNORMAL LOW (ref 27.0–33.0)
MCHC: 31.8 g/dL — ABNORMAL LOW (ref 32.0–36.0)
MCV: 82.7 fL (ref 80.0–100.0)
MPV: 10 fL (ref 7.5–12.5)
Monocytes Relative: 7.9 %
Neutro Abs: 3167 cells/uL (ref 1500–7800)
Neutrophils Relative %: 54.6 %
Platelets: 297 10*3/uL (ref 140–400)
RBC: 4.34 10*6/uL (ref 3.80–5.10)
RDW: 15.5 % — ABNORMAL HIGH (ref 11.0–15.0)
Total Lymphocyte: 33 %
WBC: 5.8 10*3/uL (ref 3.8–10.8)

## 2019-12-05 LAB — COMPLETE METABOLIC PANEL WITH GFR
AG Ratio: 1.5 (calc) (ref 1.0–2.5)
ALT: 14 U/L (ref 6–29)
AST: 18 U/L (ref 10–35)
Albumin: 4.1 g/dL (ref 3.6–5.1)
Alkaline phosphatase (APISO): 93 U/L (ref 37–153)
BUN: 14 mg/dL (ref 7–25)
CO2: 25 mmol/L (ref 20–32)
Calcium: 9.3 mg/dL (ref 8.6–10.4)
Chloride: 110 mmol/L (ref 98–110)
Creat: 0.64 mg/dL (ref 0.60–0.93)
GFR, Est African American: 103 mL/min/{1.73_m2} (ref >=60)
GFR, Est Non African American: 89 mL/min/{1.73_m2} (ref >=60)
Globulin: 2.8 g/dL (calc) (ref 1.9–3.7)
Glucose, Bld: 96 mg/dL (ref 65–99)
Potassium: 4.4 mmol/L (ref 3.5–5.3)
Sodium: 143 mmol/L (ref 135–146)
Total Bilirubin: 0.3 mg/dL (ref 0.2–1.2)
Total Protein: 6.9 g/dL (ref 6.1–8.1)

## 2019-12-06 NOTE — Progress Notes (Signed)
Hemoglobin is borderline low but stable. Rest of CBC stable. CMP WNL

## 2019-12-12 ENCOUNTER — Ambulatory Visit: Payer: Medicare Other | Attending: Internal Medicine

## 2019-12-12 DIAGNOSIS — Z23 Encounter for immunization: Secondary | ICD-10-CM

## 2019-12-12 NOTE — Progress Notes (Signed)
   Covid-19 Vaccination Clinic  Name:  FRIMET DURFEE    MRN: [id number] DOB: 05/09/1946  12/12/2019  Ms. Will was observed post Covid-19 immunization for 15 minutes without incident. She was provided with Vaccine Information Sheet and instruction to access the V-Safe system.   Ms. Monda was instructed to call 911 with any severe reactions post vaccine: Marland Kitchen Difficulty breathing  . Swelling of face and throat  . A fast heartbeat  . A bad rash all over body  . Dizziness and weakness   Immunizations Administered    Name Date Dose VIS Date Route   Pfizer COVID-19 Vaccine 12/12/2019  9:16 AM 0.3 mL 08/24/2019 Intramuscular   Manufacturer: [employer], [employer]   Lot: [id number]   NDC: [address]-2

## 2019-12-18 ENCOUNTER — Other Ambulatory Visit: Payer: Self-pay | Admitting: Orthopedic Surgery

## 2019-12-18 DIAGNOSIS — M533 Sacrococcygeal disorders, not elsewhere classified: Secondary | ICD-10-CM

## 2019-12-18 DIAGNOSIS — M25551 Pain in right hip: Secondary | ICD-10-CM

## 2019-12-18 DIAGNOSIS — M25559 Pain in unspecified hip: Secondary | ICD-10-CM

## 2019-12-19 ENCOUNTER — Ambulatory Visit
Admission: RE | Admit: 2019-12-19 | Discharge: 2019-12-19 | Disposition: A | Discharge status: Home / Self Care | Payer: Medicare Other | Source: Ambulatory Visit | Attending: Orthopedic Surgery | Admitting: Orthopedic Surgery

## 2019-12-19 DIAGNOSIS — M25551 Pain in right hip: Secondary | ICD-10-CM

## 2019-12-19 DIAGNOSIS — M533 Sacrococcygeal disorders, not elsewhere classified: Secondary | ICD-10-CM

## 2020-01-03 ENCOUNTER — Other Ambulatory Visit: Payer: Medicare Other

## 2020-02-12 ENCOUNTER — Telehealth: Payer: Self-pay | Admitting: Obstetrics and Gynecology

## 2020-02-12 NOTE — Telephone Encounter (Signed)
Call to patient. Per DPR, OK to leave message on voicemail.   Left voicemail requesting a return call to [facility] to review benefits and schedule recommended Pelvic ultrasound with Brook A. Silva, MD, FACOG 

## 2020-02-13 NOTE — Telephone Encounter (Signed)
Patient is returning call.  °

## 2020-02-13 NOTE — Telephone Encounter (Signed)
Spoke with patient regarding benefits for recommended ultrasound. Patient is aware that ultrasound is transvaginal. Patient acknowledges understanding of information presented. Patient is aware of cancellation policy. Patient scheduled appointment for 02/28/2020 at 0900AM with Brook A. Edward Jolly, MD, [facility]. Encounter closed.

## 2020-02-27 ENCOUNTER — Other Ambulatory Visit: Payer: Self-pay

## 2020-02-28 ENCOUNTER — Ambulatory Visit (INDEPENDENT_AMBULATORY_CARE_PROVIDER_SITE_OTHER): Payer: Medicare Other | Admitting: Obstetrics and Gynecology

## 2020-02-28 ENCOUNTER — Ambulatory Visit (INDEPENDENT_AMBULATORY_CARE_PROVIDER_SITE_OTHER): Payer: Medicare Other

## 2020-02-28 ENCOUNTER — Encounter: Payer: Self-pay | Admitting: Obstetrics and Gynecology

## 2020-02-28 VITALS — BP 140/82 | HR 66 | Temp 97.0°F | Ht 68.0 in | Wt 188.0 lb

## 2020-02-28 DIAGNOSIS — N83201 Unspecified ovarian cyst, right side: Secondary | ICD-10-CM

## 2020-02-28 DIAGNOSIS — N83202 Unspecified ovarian cyst, left side: Secondary | ICD-10-CM | POA: Diagnosis not present

## 2020-02-28 NOTE — Progress Notes (Signed)
GYNECOLOGY  VISIT   HPI: 74 y.o.   Married  Caucasian  female   [phone] with Patient's last menstrual period was 09/13/1984 (approximate).   here for pelvic ultrasound.  Last US done 09/27/19: Uterus absent.  Right ovary with 10 and 5 mm cysts.  Left ovary with 2 cysts:  37 x 25 x 31 mm cyst and 7 mm cyst.  Echogenic material.  No abnormal blood flow.  No free fluid.  CA125 7.8 on 09/27/19.  Recovering from a fracture of her pelvis.   She saw Dr. Byrd Hesselbach at [facility] for fecal incontinence.  She is adding fiber to her diet and is going well.  She takes Metamucil once daily. She did PT at [facility].   GYNECOLOGIC HISTORY: Patient's last menstrual period was 09/13/1984 (approximate). Contraception: PMP Menopausal hormone therapy:  none Last mammogram:  05-17-19 3D/Neg/density B/BiRads1 Last pap smear:  07-26-18 Neg,06-27-17 Neg, 06-03-16 ASCUS:Neg HR HPV        OB History    Gravida  3   Para  3   Term  3   Preterm  0   AB  0   Living  3     SAB  0   TAB  0   Ectopic  0   Multiple  0   Live Births  3              Patient Active Problem List   Diagnosis Date Noted  . Primary localized osteoarthritis of right knee 10/19/2018  . Right foot pain 10/19/2018  . Incontinence of feces 09/21/2018  . Vulvar lesion 07/20/2018  . History of repair of right rotator cuff 03/03/2017  . Primary osteoarthritis of both hands/ has had bilateral [facility] surgery  03/03/2017  . History of hypertension 03/03/2017  . History of hyperlipidemia 03/03/2017  . DDD (degenerative disc disease), lumbar/ history of lumbar surgery  03/03/2017  . Osteopenia of multiple sites 03/03/2017  . Chronic left shoulder pain 01/20/2017  . Primary osteoarthritis of both knees 01/20/2017  . Trapezius muscle spasm 01/20/2017  . Insomnia 01/20/2017  . Fatigue 01/20/2017  . Fibromyalgia 01/20/2017  . High risk medications (not anticoagulants) long-term use 01/20/2017  . PMR (polymyalgia  rheumatica) (HCC) 01/20/2017  . Left ovarian cyst 06/02/2015  . Degenerative joint disease, shoulder, right 08/02/2013  . Precordial pain 02/11/2012  . HTN (hypertension) 02/11/2012  . Hyperlipidemia 02/11/2012    Past Medical History:  Diagnosis Date  . Bilateral ovarian cysts 09/12/2014   bilateral simple ovarian cysts.  [place] due in January 2018.  . 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 Fibromyalgia   . HTN (hypertension)    under control with meds., has been on med. x 20 yr.  . Hyperlipidemia   . Hypothyroid   . IBS (irritable bowel syndrome)   . Impingement syndrome of right shoulder 07/2013  . Polymyalgia rheumatica (HCC)    5-28  states she has been on prednisone and now is trying to only be on plaquenil to manage the polymyalgia   . PONV (postoperative nausea and vomiting)   . Primary localized osteoarthritis of right knee   . Rupture of right rotator cuff 07/2013  . VIN I (vulvar intraepithelial neoplasia I) 2017, 2018   this is from a perineal/vulvar biopsy.  . Vitamin B12 deficiency     Past Surgical History:  Procedure Laterality Date  . ANTERIOR LAT LUMBAR FUSION  10/08/2008   L3-4  . BACK SURGERY    .  BACK SURGERY  10-03-14   --Dr. Glenna Fellows at [facility]  11/2009   polyps  . COLONOSCOPY W/ BIOPSIES  11/2012   polyp, recheck in 3 years  . CYSTO  07/05/2006  . CYSTOCELE REPAIR  07/05/2006  . ESOPHAGOGASTRODUODENOSCOPY ENDOSCOPY  05/25/05   erythema and edema of gastric lumen  . EYE SURGERY Right    muscle adjustment   . FOOT SURGERY Right   . GANGLION CYST EXCISION Right    wrist  . INCONTINENCE SURGERY  07/05/2006   cystourethropexy  . ORIF FOREARM FRACTURE Right 09/2012  . SHOULDER ARTHROSCOPY WITH ROTATOR CUFF REPAIR AND SUBACROMIAL DECOMPRESSION Right 08/02/2013   Procedure: SHOULDER ARTHROSCOPY WITH ROTATOR CUFF REPAIR AND SUBACROMIAL DECOMPRESSION, EXTENSIVE DEBRIDEMENT (INCLUDING LABRUM), PARTIAL  ACROMIPLASY WITH CORACOACROMIAL RELEASE, DISTAL CLAVICULECTOMY;  Surgeon: Ninetta Lights, MD;  Location: [facility];  Service: Orthopedics;  Laterality: Right;  . STRABISMUS SURGERY Right 01/26/2008   2 other surgeries previously  . STRABISMUS SURGERY Right 06/03/2017   Procedure: REPAIR STRABISMUS RIGHT EYE;  Surgeon: Everitt Amber, MD;  Location: [facility];  Service: Ophthalmology;  Laterality: Right;  . Thumb surgery  2/13 & ?2010   Bilateral - states a joint was replaced  . TOTAL KNEE ARTHROPLASTY Right 02/12/2019   Procedure: TOTAL KNEE ARTHROPLASTY;  Surgeon: Elsie Saas, MD;  Location: [facility];  Service: Orthopedics;  Laterality: Right;  . TOTAL VAGINAL HYSTERECTOMY  1986 - or age 12   secondary AUB  . TUBAL LIGATION     laparosopic BTL age 62  . varicose veins Bilateral     Current Outpatient Medications  Medication Sig Dispense Refill  . benazepril (LOTENSIN) 40 MG tablet Take 40 mg by mouth daily.     . Calcium Carb-Cholecalciferol (CALCIUM 600 + D PO) Take 1 tablet by mouth daily.    . diclofenac sodium (VOLTAREN) 1 % GEL Apply 3 gm to 3 large joints up to 3 times a day.Dispense 3 tubes with 3 refills. (Patient taking differently: Apply 1 application topically 3 (three) times daily as needed (PAIN). Apply 3 gm to 3 large joints up to 3 times a day.Dispense 3 tubes with 3 refills.) 3 Tube 1  . levothyroxine (SYNTHROID) 125 MCG tablet Take 125 mcg by mouth daily.    . Multiple Vitamin (MULTIVITAMIN) tablet Take 1 tablet by mouth daily.    . naproxen sodium (ALEVE) 220 MG tablet Take 220 mg by mouth 2 (two) times daily as needed.    . Omega-3 Fatty Acids (FISH OIL) 1000 MG CAPS Take 1 capsule by mouth daily.    Marland Kitchen OVER THE COUNTER MEDICATION Take 1 tablet by mouth daily. Focus Factor    . vitamin B-12 (CYANOCOBALAMIN) 100 MCG tablet Take 100 mcg by mouth daily.     No current facility-administered medications for this visit.     ALLERGIES:  Penicillins and Sulfa antibiotics  Family History  Problem Relation Age of Onset  . Aortic aneurysm Father   . Hypertension Father   . Coronary artery disease Mother 66  . Hypertension Mother   . Hyperlipidemia Brother        brain and kidney tumor, no treatment needed   . Aneurysm Brother   . Healthy Daughter   . Healthy Daughter   . Healthy Daughter     Social History   Socioeconomic History  . Marital status: Married    Spouse name: Not on file  . Number of  children: 3  . Years of education: 68  . Highest education level: Not on file  Occupational History  . Occupation: retired  Tobacco Use  . Smoking status: Never Smoker  . Smokeless tobacco: Never Used  Vaping Use  . Vaping Use: Never used  Substance and Sexual Activity  . Alcohol use: No    Alcohol/week: 0.0 standard drinks  . Drug use: No  . Sexual activity: Not Currently    Partners: Male    Birth control/protection: Surgical    Comment: Hyst  Other Topics Concern  . Not on file  Social History Narrative   Lives at home with husband . One story home. Right handed   Social Determinants of Health   Financial Resource Strain:   . Difficulty of Paying Living Expenses:   Food Insecurity:   . Worried About Programme researcher, broadcasting/film/video in the Last Year:   . Barista in the Last Year:   Transportation Needs:   . Freight forwarder (Medical):   Marland Kitchen Lack of Transportation (Non-Medical):   Physical Activity:   . Days of Exercise per Week:   . Minutes of Exercise per Session:   Stress:   . Feeling of Stress :   Social Connections:   . Frequency of Communication with Friends and Family:   . Frequency of Social Gatherings with Friends and Family:   . Attends Religious Services:   . Active Member of Clubs or Organizations:   . Attends Banker Meetings:   Marland Kitchen Marital Status:   Intimate Partner Violence:   . Fear of Current or Ex-Partner:   . Emotionally Abused:   Marland Kitchen Physically Abused:   .  Sexually Abused:     Review of Systems  All other systems reviewed and are negative.   PHYSICAL EXAMINATION:    BP 140/82   Pulse 66   Temp (!) 97 F (36.1 C) (Temporal)   Ht 5\' 8"  (1.727 m)   Wt 188 lb (85.3 kg)   LMP 09/13/1984 (Approximate)   BMI 28.59 kg/m     General appearance: alert, cooperative and appears stated age  Pelvic 11/11/1984 Uterus absent. Right ovary normal. Right adnexal cystic structure 1.2 x 1.4 cm.  Left ovary 2.9 x 2.8 cm, slightly smaller than previous US Jan 2021.  Simple cyst.  Normal blood flow.  No free fluid.   ASSESSMENT  Left ovarian cyst. Slightly smaller than previous, but increased in size overall since 2015.  Right adnexal cyst.  Stable.  PLAN  Ultrasound images and report reviewed.  We discussed simple ovarian cyst management in postmenopausal patients and reviewed Up to Date Guidelines.  Will return for a follow up ultrasound in 6 months.  She will call if she develops pain.    An After Visit Summary was printed and given to the patient.  ___26___ minutes visit.

## 2020-04-02 ENCOUNTER — Other Ambulatory Visit: Payer: Self-pay | Admitting: Obstetrics and Gynecology

## 2020-04-02 DIAGNOSIS — Z1231 Encounter for screening mammogram for malignant neoplasm of breast: Secondary | ICD-10-CM

## 2020-04-24 NOTE — Progress Notes (Signed)
Office Visit Note  Patient: [name]             Date of Birth: 11-Nov-1945           MRN: [id number]             PCP: Kaleen Mask, MD Referring: Kaleen Mask, * Visit Date: 05/08/2020 Occupation: @GUAROCC @  Subjective:  Right foot pain   History of Present Illness: [name] is a 74 y.o. female with history of PMR and osteoarthritis.  Patient reports that she discontinued Plaquenil in February 2021 on due to not finding any clinical benefit taking it.  She has not developed any new or worsening symptoms since discontinuing Plaquenil.  She denies any increased muscle weakness or muscle tenderness.  She denies any recent PMR flares.  She states that she continues to take prednisone 5 mg 1 tablet daily as needed if she feels as though she is starting to have a flare.  She requested a refill of prednisone today.  She does not want to restart on Plaquenil at this time. Patient reports that she continues to have chronic lower back pain.  She states that her previous neurosurgeon retired.  She states that her last epidural injection was in December 2020 and states that she needs to schedule another epidural injection.  She is also been having increased discomfort in the right foot.  She has noticed some deformity in her great toe.  Activities of Daily Living:  Patient reports morning stiffness for several  hours.   Patient Reports nocturnal pain.  Difficulty dressing/grooming: Denies Difficulty climbing stairs: Reports Difficulty getting out of chair: Reports Difficulty using hands for taps, buttons, cutlery, and/or writing: Denies  Review of Systems  Constitutional: Negative for fatigue.  HENT: Negative for mouth sores, mouth dryness and nose dryness.   Eyes: Negative for itching and dryness.  Respiratory: Negative for shortness of breath and difficulty breathing.   Cardiovascular: Negative for chest pain and palpitations.  Gastrointestinal: Negative  for blood in stool, constipation and diarrhea.  Endocrine: Negative for increased urination.  Genitourinary: Negative for difficulty urinating.  Musculoskeletal: Positive for arthralgias, joint pain and morning stiffness. Negative for joint swelling, myalgias, muscle tenderness and myalgias.  Skin: Positive for rash. Negative for color change.  Allergic/Immunologic: Negative for susceptible to infections.  Neurological: Negative for dizziness, numbness, headaches, memory loss and weakness.  Hematological: Negative for bruising/bleeding tendency.  Psychiatric/Behavioral: Positive for sleep disturbance. Negative for confusion.    PMFS History:  Patient Active Problem List   Diagnosis Date Noted  . Primary localized osteoarthritis of right knee 10/19/2018  . Right foot pain 10/19/2018  . Incontinence of feces 09/21/2018  . Vulvar lesion 07/20/2018  . History of repair of right rotator cuff 03/03/2017  . Primary osteoarthritis of both hands/ has had bilateral CMC surgery  03/03/2017  . History of hypertension 03/03/2017  . History of hyperlipidemia 03/03/2017  . DDD (degenerative disc disease), lumbar/ history of lumbar surgery  03/03/2017  . Osteopenia of multiple sites 03/03/2017  . Chronic left shoulder pain 01/20/2017  . Primary osteoarthritis of both knees 01/20/2017  . Trapezius muscle spasm 01/20/2017  . Insomnia 01/20/2017  . Fatigue 01/20/2017  . Fibromyalgia 01/20/2017  . High risk medications (not anticoagulants) long-term use 01/20/2017  . PMR (polymyalgia rheumatica) (HCC) 01/20/2017  . Left ovarian cyst 06/02/2015  . Degenerative joint disease, shoulder, right 08/02/2013  . Precordial pain 02/11/2012  . HTN (hypertension) 02/11/2012  . Hyperlipidemia  02/11/2012    Past Medical History:  Diagnosis Date  . Bilateral ovarian cysts 09/12/2014   bilateral simple ovarian cysts.  [place] due in January 2018.  . 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  Fibromyalgia   . HTN (hypertension)    under control with meds., has been on med. x 20 yr.  . Hyperlipidemia   . Hypothyroid   . IBS (irritable bowel syndrome)   . Impingement syndrome of right shoulder 07/2013  . Polymyalgia rheumatica (HCC)    5-28  states she has been on prednisone and now is trying to only be on plaquenil to manage the polymyalgia   . PONV (postoperative nausea and vomiting)   . Primary localized osteoarthritis of right knee   . Rupture of right rotator cuff 07/2013  . VIN I (vulvar intraepithelial neoplasia I) 2017, 2018   this is from a perineal/vulvar biopsy.  . Vitamin B12 deficiency     Family History  Problem Relation Age of Onset  . Aortic aneurysm Father   . Hypertension Father   . Coronary artery disease Mother 55  . Hypertension Mother   . Hyperlipidemia Brother        brain and kidney tumor, no treatment needed   . Aneurysm Brother   . Healthy Daughter   . Healthy Daughter   . Healthy Daughter    Past Surgical History:  Procedure Laterality Date  . ANTERIOR LAT LUMBAR FUSION  10/08/2008   L3-4  . BACK SURGERY    . BACK SURGERY  10-03-14   --[facility] Trey Sailors [facility]  . COLONOSCOPY W/ BIOPSIES  11/2009   polyps  . COLONOSCOPY W/ BIOPSIES  11/2012   polyp, recheck in 3 years  . CYSTO  07/05/2006  . CYSTOCELE REPAIR  07/05/2006  . ESOPHAGOGASTRODUODENOSCOPY ENDOSCOPY  05/25/05   erythema and edema of gastric lumen  . EYE SURGERY Right    muscle adjustment   . FOOT SURGERY Right   . GANGLION CYST EXCISION Right    wrist  . INCONTINENCE SURGERY  07/05/2006   cystourethropexy  . ORIF FOREARM FRACTURE Right 09/2012  . SHOULDER ARTHROSCOPY WITH ROTATOR CUFF REPAIR AND SUBACROMIAL DECOMPRESSION Right 08/02/2013   Procedure: SHOULDER ARTHROSCOPY WITH ROTATOR CUFF REPAIR AND SUBACROMIAL DECOMPRESSION, EXTENSIVE DEBRIDEMENT (INCLUDING LABRUM), PARTIAL ACROMIPLASY WITH CORACOACROMIAL RELEASE, DISTAL CLAVICULECTOMY;  Surgeon: Loreta Ave,  MD;  Location: [facility];  Service: Orthopedics;  Laterality: Right;  . STRABISMUS SURGERY Right 01/26/2008   2 other surgeries previously  . STRABISMUS SURGERY Right 06/03/2017   Procedure: REPAIR STRABISMUS RIGHT EYE;  Surgeon: Verne Carrow, MD;  Location: [facility];  Service: Ophthalmology;  Laterality: Right;  . Thumb surgery  2/13 & ?2010   Bilateral - states a joint was replaced  . TOTAL KNEE ARTHROPLASTY Right 02/12/2019   Procedure: TOTAL KNEE ARTHROPLASTY;  Surgeon: Salvatore Marvel, MD;  Location: [facility];  Service: Orthopedics;  Laterality: Right;  . TOTAL VAGINAL HYSTERECTOMY  1986 - or age 13   secondary AUB  . TUBAL LIGATION     laparosopic BTL age 11  . varicose veins Bilateral    Social History   Social History Narrative   Lives at home with husband . One story home. Right handed   Immunization History  Administered Date(s) Administered  . Influenza-Unspecified 08/04/2017  . PFIZER SARS-COV-2 Vaccination 11/15/2019, 12/12/2019  . Pneumococcal Conjugate-13 05/14/2014, 05/15/2015  . Tdap 09/13/2012     Objective: Vital Signs:  BP 131/84 (BP Location: Left Arm, Patient Position: Sitting, Cuff Size: Normal)   Pulse 67   Resp 15   Ht 5\' 8"  (1.727 m)   Wt 196 lb 3.2 oz (89 kg)   LMP 09/13/1984 (Approximate)   BMI 29.83 kg/m    Physical Exam Vitals and nursing note reviewed.  Constitutional:      Appearance: She is well-developed.  HENT:     Head: Normocephalic and atraumatic.  Eyes:     Conjunctiva/sclera: Conjunctivae normal.  Pulmonary:     Effort: Pulmonary effort is normal.  Abdominal:     Palpations: Abdomen is soft.  Musculoskeletal:     Cervical back: Normal range of motion.  Skin:    General: Skin is warm and dry.     Capillary Refill: Capillary refill takes less than 2 seconds.  Neurological:     Mental Status: She is alert and oriented to person, place, and time.  Psychiatric:        Behavior: Behavior normal.       Musculoskeletal Exam: C-spine limited range of motion.  Thoracic kyphosis noted.  Painful and limited range of motion of lumbar spine.  Shoulder joints have good range of motion with no discomfort.  Full strength of upper extremities noted.  Elbow joints have good range of motion with no tenderness or inflammation.  She has limited range of motion and synovial thickening of both wrist joints.  Prominence of the ulnar styloid of the left wrist noted.  She has PIP and DIP thickening consistent with osteoarthritis of both hands.  CMC joint prominence noted bilaterally.  Hip joints have good range of motion with no discomfort.  Right knee replacement has warmth noted.  Left knee has good range of motion with no warmth or effusion.  Left knee crepitus noted.  Ankle joints have good range of motion with no tenderness.  Dorsal spurs noted bilaterally.  She has tenderness of the right first MTP and PIP joint.  CDAI Exam: CDAI Score: - Patient Global: -; Provider Global: - Swollen: -; Tender: - Joint Exam 05/08/2020   No joint exam has been documented for this visit   There is currently no information documented on the homunculus. Go to the Rheumatology activity and complete the homunculus joint exam.  Investigation: No additional findings.  Imaging: No results found.  Recent Labs: Lab Results  Component Value Date   WBC 5.8 12/05/2019   HGB 11.4 (L) 12/05/2019   PLT 297 12/05/2019   NA 143 12/05/2019   K 4.4 12/05/2019   CL 110 12/05/2019   CO2 25 12/05/2019   GLUCOSE 96 12/05/2019   BUN 14 12/05/2019   CREATININE 0.64 12/05/2019   BILITOT 0.3 12/05/2019   ALKPHOS 73 02/08/2019   AST 18 12/05/2019   ALT 14 12/05/2019   PROT 6.9 12/05/2019   ALBUMIN 4.1 02/08/2019   CALCIUM 9.3 12/05/2019   GFRAA 103 12/05/2019    Speciality Comments: PLQ Eye Exam: 04/07/2020 WNL @ [facility] Follow up in 1 year  Procedures:  No procedures performed Allergies: Penicillins and Sulfa  antibiotics   Assessment / Plan:     Visit Diagnoses: PMR (polymyalgia rheumatica) (HCC): She has not had any signs or symptoms of a PMR flare recently.  She is not experiencing any increased muscle weakness or muscle tenderness. She has full strength of upper and lower extremities.  She discontinued Plaquenil in February 2021 due to not noticing any clinical improvement while taking it.  She  does not want to restart Plaquenil at this time.  She continues to take prednisone 5 mg 1 tablet by mouth daily very sparingly if she is experiencing symptoms of a flare.  She requested a refill of prednisone to have on hand to take when she is experiencing symptoms of a PMR flare.  She was advised to notify us if the symptoms become more frequent or more severe.  She will follow up in 5 months.  High risk medications (not anticoagulants) long-term use -She discontinued Plaquenil in February 2021 due to not noticing any clinical improvement in her symptoms.  She does not want to restart Plaquenil at this time.  Primary osteoarthritis of both hands/ has had bilateral CMC surgery: She has PIP and DIP thickening consistent with osteoarthritis of both hands.  CMC joint thickening bilaterally.  Joint protection and muscle strengthening were discussed.   S/P TKR (total knee replacement), right: Right knee replacement has limited extension and warmth on exam.  Primary osteoarthritis of left knee: She has good range of motion of the left knee joint on exam.  No warmth or effusion was noted on exam.  Left knee crepitus noted.   Primary osteoarthritis of right shoulder: She has good range of motion with no discomfort at this time.  Trapezius muscle spasm: She experiences intermittent trapezius muscle tension and muscle tenderness.  Fibromyalgia: She experiences intermittent myalgias and muscle tenderness due to underlying fibromyalgia.  Overall her discomfort has been tolerable.  She continues to have chronic fatigue  secondary to insomnia.  She has significant difficulty falling asleep at night.  She has tried taking gabapentin at bedtime but it has not helped with the insomnia.  We discussed the importance of regular exercise and good sleep hygiene.   History of repair of right rotator cuff: She has good ROM with no discomfort.    DDD (degenerative disc disease), lumbar: Chronic pain.  She was advised to follow up with her neurosurgeon.  She had an epidural injection in December 2020.    Other insomnia: She has difficulty falling asleep at night.  She has tried taking gabapentin at bedtime but has not noticed any improvement in her insomnia.  She has tried melatonin without any benefit.  She plans on following up with her PCP to discuss her worsening insomnia.  Other fatigue: She has chronic fatigue secondary to insomnia.  Osteopenia of multiple sites - DEXA on 09/29/17 BMD at femur neck left is 0.851 with a T-score of -1.3.  She is overdue to update DEXA.  She will be following up with her endocrinologist. She is taking a calcium and vitamin D supplement.   Pain in right foot -She has been experiencing increased discomfort in the right foot for the past several months.  She has PIP and DIP thickening consistent with osteoarthritis of both feet.  Dorsal spurs noted bilaterally.  She has tenderness of the right first MTP and PIP joint. A referral to a podiatrist was placed today for further evaluation.  We discussed the importance of wearing proper fitting shoes.  Plan: Ambulatory referral to Podiatry  Other medical conditions are listed as follows:   History of hypertension  History of hyperlipidemia    Orders: Orders Placed This Encounter  Procedures  . Ambulatory referral to Podiatry   Meds ordered this encounter  Medications  . predniSONE (DELTASONE) 5 MG tablet    Sig: Take 1 tablet (5 mg total) by mouth daily with breakfast.    Dispense:  30 tablet  Refill:  0    Face-to-face time spent  with patient was 30  minutes. Greater than 50% of time was spent in counseling and coordination of care.  Follow-Up Instructions: Return in about 6 months (around 11/08/2020) for Polymyalgia Rheumatica, Osteoarthritis.   Gearldine Bienenstock, PA-C  Note - This record has been created using Dragon software.  Chart creation errors have been sought, but may not always  have been located. Such creation errors do not reflect on  the standard of medical care.

## 2020-05-08 ENCOUNTER — Ambulatory Visit: Payer: Medicare Other | Admitting: Physician Assistant

## 2020-05-08 ENCOUNTER — Encounter: Payer: Self-pay | Admitting: Physician Assistant

## 2020-05-08 ENCOUNTER — Other Ambulatory Visit: Payer: Self-pay

## 2020-05-08 VITALS — BP 131/84 | HR 67 | Resp 15 | Ht 68.0 in | Wt 196.2 lb

## 2020-05-08 DIAGNOSIS — G4709 Other insomnia: Secondary | ICD-10-CM

## 2020-05-08 DIAGNOSIS — M797 Fibromyalgia: Secondary | ICD-10-CM

## 2020-05-08 DIAGNOSIS — M19042 Primary osteoarthritis, left hand: Secondary | ICD-10-CM

## 2020-05-08 DIAGNOSIS — M8589 Other specified disorders of bone density and structure, multiple sites: Secondary | ICD-10-CM

## 2020-05-08 DIAGNOSIS — M1712 Unilateral primary osteoarthritis, left knee: Secondary | ICD-10-CM

## 2020-05-08 DIAGNOSIS — Z96651 Presence of right artificial knee joint: Secondary | ICD-10-CM | POA: Diagnosis not present

## 2020-05-08 DIAGNOSIS — Z8679 Personal history of other diseases of the circulatory system: Secondary | ICD-10-CM

## 2020-05-08 DIAGNOSIS — M5136 Other intervertebral disc degeneration, lumbar region: Secondary | ICD-10-CM

## 2020-05-08 DIAGNOSIS — M19041 Primary osteoarthritis, right hand: Secondary | ICD-10-CM | POA: Diagnosis not present

## 2020-05-08 DIAGNOSIS — Z8639 Personal history of other endocrine, nutritional and metabolic disease: Secondary | ICD-10-CM

## 2020-05-08 DIAGNOSIS — Z79899 Other long term (current) drug therapy: Secondary | ICD-10-CM

## 2020-05-08 DIAGNOSIS — M353 Polymyalgia rheumatica: Secondary | ICD-10-CM | POA: Diagnosis not present

## 2020-05-08 DIAGNOSIS — Z9889 Other specified postprocedural states: Secondary | ICD-10-CM

## 2020-05-08 DIAGNOSIS — M19011 Primary osteoarthritis, right shoulder: Secondary | ICD-10-CM

## 2020-05-08 DIAGNOSIS — R5383 Other fatigue: Secondary | ICD-10-CM

## 2020-05-08 DIAGNOSIS — M62838 Other muscle spasm: Secondary | ICD-10-CM

## 2020-05-08 DIAGNOSIS — M79671 Pain in right foot: Secondary | ICD-10-CM

## 2020-05-08 MED ORDER — PREDNISONE 5 MG PO TABS
5.0000 mg | ORAL_TABLET | Freq: Every day | ORAL | 0 refills | Status: DC
Start: 2020-05-08 — End: 2020-06-03

## 2020-05-21 ENCOUNTER — Other Ambulatory Visit: Payer: Self-pay

## 2020-05-21 ENCOUNTER — Ambulatory Visit
Admission: RE | Admit: 2020-05-21 | Discharge: 2020-05-21 | Disposition: A | Discharge status: Home / Self Care | Payer: Medicare Other | Source: Ambulatory Visit | Attending: Obstetrics and Gynecology | Admitting: Obstetrics and Gynecology

## 2020-05-21 DIAGNOSIS — Z1231 Encounter for screening mammogram for malignant neoplasm of breast: Secondary | ICD-10-CM

## 2020-05-22 ENCOUNTER — Other Ambulatory Visit: Payer: Self-pay | Admitting: Sports Medicine

## 2020-05-22 ENCOUNTER — Ambulatory Visit (INDEPENDENT_AMBULATORY_CARE_PROVIDER_SITE_OTHER): Payer: Medicare Other

## 2020-05-22 ENCOUNTER — Ambulatory Visit: Payer: Medicare Other | Admitting: Sports Medicine

## 2020-05-22 ENCOUNTER — Encounter: Payer: Self-pay | Admitting: Sports Medicine

## 2020-05-22 DIAGNOSIS — M79671 Pain in right foot: Secondary | ICD-10-CM | POA: Diagnosis not present

## 2020-05-22 DIAGNOSIS — M19079 Primary osteoarthritis, unspecified ankle and foot: Secondary | ICD-10-CM

## 2020-05-22 DIAGNOSIS — M2011 Hallux valgus (acquired), right foot: Secondary | ICD-10-CM | POA: Diagnosis not present

## 2020-05-22 DIAGNOSIS — M19071 Primary osteoarthritis, right ankle and foot: Secondary | ICD-10-CM

## 2020-05-22 NOTE — Progress Notes (Signed)
Subjective: [name] is a 74 y.o. female patient who presents to office for evaluation of right foot pain.  Patient complains of pain from crossover of first and second toes as well as pain across the top of the foot states that it is difficult sometimes to wear a normal shoe because of rubbing and states that she had an injury 40 years ago and had an old issue where she had the area smooth down but over the years the bone spur has come back reports that she has some occasional tingling and burning and had a neuropathy test with Dr. Allena Katz that was negative.  Patient denies any other pedal complaints at this time.  Review of systems noncontributory.   Patient Active Problem List   Diagnosis Date Noted  . Primary localized osteoarthritis of right knee 10/19/2018  . Right foot pain 10/19/2018  . Incontinence of feces 09/21/2018  . Vulvar lesion 07/20/2018  . History of repair of right rotator cuff 03/03/2017  . Primary osteoarthritis of both hands/ has had bilateral CMC surgery  03/03/2017  . History of hypertension 03/03/2017  . History of hyperlipidemia 03/03/2017  . DDD (degenerative disc disease), lumbar/ history of lumbar surgery  03/03/2017  . Osteopenia of multiple sites 03/03/2017  . Chronic left shoulder pain 01/20/2017  . Primary osteoarthritis of both knees 01/20/2017  . Trapezius muscle spasm 01/20/2017  . Insomnia 01/20/2017  . Fatigue 01/20/2017  . Fibromyalgia 01/20/2017  . High risk medications (not anticoagulants) long-term use 01/20/2017  . PMR (polymyalgia rheumatica) (HCC) 01/20/2017  . Left ovarian cyst 06/02/2015  . Degenerative joint disease, shoulder, right 08/02/2013  . Precordial pain 02/11/2012  . HTN (hypertension) 02/11/2012  . Hyperlipidemia 02/11/2012    Current Outpatient Medications on File Prior to Visit  Medication Sig Dispense Refill  . benazepril (LOTENSIN) 40 MG tablet Take 40 mg by mouth daily.     . Calcium Carb-Cholecalciferol  (CALCIUM 600 + D PO) Take 1 tablet by mouth daily.    . diclofenac sodium (VOLTAREN) 1 % GEL Apply 3 gm to 3 large joints up to 3 times a day.Dispense 3 tubes with 3 refills. (Patient taking differently: Apply 1 application topically 3 (three) times daily as needed (PAIN). Apply 3 gm to 3 large joints up to 3 times a day.Dispense 3 tubes with 3 refills.) 3 Tube 1  . gabapentin (NEURONTIN) 100 MG capsule SMARTSIG:1-3 Capsule(s) By Mouth Every 8 Hours PRN    . levothyroxine (SYNTHROID) 125 MCG tablet Take 125 mcg by mouth daily.    . Multiple Vitamin (MULTIVITAMIN) tablet Take 1 tablet by mouth daily.    . naproxen sodium (ALEVE) 220 MG tablet Take 220 mg by mouth 2 (two) times daily as needed.    . vitamin B-12 (CYANOCOBALAMIN) 100 MCG tablet Take 100 mcg by mouth daily.    . predniSONE (DELTASONE) 5 MG tablet Take 1 tablet (5 mg total) by mouth daily with breakfast. 30 tablet 0   No current facility-administered medications on file prior to visit.    Allergies  Allergen Reactions  . Penicillins Itching    OF MOUTH Did it involve swelling of the face/tongue/throat, SOB, or low BP? Unknown Did it involve sudden or severe rash/hives, skin peeling, or any reaction on the inside of your mouth or nose? Unknown Did you need to seek medical attention at a hospital or doctor's office? Unknown When did it last happen?in my 20s If all above answers are "NO", may proceed with cephalosporin  use.   . Sulfa Antibiotics Itching    OF MOUTH    Objective:  General: Alert and oriented x3 in no acute distress  Dermatology: No open lesions bilateral lower extremities, no webspace macerations, no ecchymosis bilateral, all nails x 10 are well manicured.  Vascular: Dorsalis Pedis and Posterior Tibial pedal pulses palpable, Capillary Fill Time 3 seconds,(+) pedal hair growth bilateral, no edema bilateral lower extremities, Temperature gradient within normal limits.  Neurology: Gross sensation intact via  light touch bilateral, subjective tingling to toes occasionally.  Musculoskeletal: Moderate tenderness palpation to right foot at the first toe and minimal tenderness to palpation to the midfoot with bony exostosis and significant midfoot collapse history of arthritis no trauma or injury.  Pes planus foot type.  Gait: Antalgic gait  Xrays  Right foot   Impression: Deviation of hallux, midfoot collapse with significant midfoot arthritis.  No other acute findings.   Assessment and Plan: Problem List Items Addressed This Visit    None    Visit Diagnoses    Pain in right foot    -  Primary   Arthritis of right foot       Hallux interphalangeus, acquired, right           -Complete examination performed -Xrays reviewed -Discussed treatment options -Dispensed toe spacer -Recommend good supportive shoes daily for foot type -Advised patient to try soaking with Epson salt topical pain cream or rub and if her arthritis pain across the dorsal midfoot continues may benefit next visit from a steroid injection versus further discussion of surgery if fails to continue to improve -Patient to return to office as needed or sooner if condition worsens.  Asencion Islam, DPM

## 2020-06-03 ENCOUNTER — Other Ambulatory Visit: Payer: Self-pay | Admitting: Physician Assistant

## 2020-06-03 NOTE — Telephone Encounter (Signed)
Last Visit: 05/08/2020 Next Visit: 11/06/2020  Current Dose per office note on 05/08/2020: prednisone 5 mg 1 tablet by mouth daily very sparingly   Okay to refill Prednisone?

## 2020-08-14 ENCOUNTER — Telehealth: Payer: Self-pay

## 2020-08-14 ENCOUNTER — Other Ambulatory Visit: Payer: Self-pay

## 2020-08-14 DIAGNOSIS — N83201 Unspecified ovarian cyst, right side: Secondary | ICD-10-CM

## 2020-08-14 NOTE — Telephone Encounter (Signed)
Spoke with patient regarding benefits for recommended ultrasound. Patient is aware that ultrasound is transvaginal. Patient acknowledges understanding of information presented. Patient is aware of cancellation policy. Patient scheduled appointment for 09/25/2020 at 0830AM with Brook A. Edward Jolly, MD, [facility]. Patient is aware that benefits with have to be reverified after the first of the year. Patient is agreeable. Encounter closed.

## 2020-08-14 NOTE — Progress Notes (Signed)
PUS orders placed per expired PUS orders by Dr Edward Jolly in 02/2020. Encounter closed

## 2020-08-14 NOTE — Telephone Encounter (Signed)
Call to patient. Per DPR, OK to leave message on voicemail.   Left voicemail requesting a return call to [name] to review benefits and schedule recommended Pelvic ultrasound with Brook A. Silva, MD, FACOG 

## 2020-09-25 ENCOUNTER — Other Ambulatory Visit: Payer: Medicare Other | Admitting: Obstetrics and Gynecology

## 2020-09-25 ENCOUNTER — Other Ambulatory Visit: Payer: Medicare Other

## 2020-10-09 ENCOUNTER — Ambulatory Visit (INDEPENDENT_AMBULATORY_CARE_PROVIDER_SITE_OTHER): Payer: Medicare Other

## 2020-10-09 ENCOUNTER — Ambulatory Visit: Payer: Medicare Other | Admitting: Obstetrics and Gynecology

## 2020-10-09 ENCOUNTER — Other Ambulatory Visit: Payer: Self-pay

## 2020-10-09 ENCOUNTER — Encounter: Payer: Self-pay | Admitting: Obstetrics and Gynecology

## 2020-10-09 ENCOUNTER — Other Ambulatory Visit: Payer: Medicare Other

## 2020-10-09 VITALS — BP 144/78 | HR 63 | Ht 68.0 in | Wt 190.0 lb

## 2020-10-09 DIAGNOSIS — N949 Unspecified condition associated with female genital organs and menstrual cycle: Secondary | ICD-10-CM | POA: Diagnosis not present

## 2020-10-09 DIAGNOSIS — N393 Stress incontinence (female) (male): Secondary | ICD-10-CM | POA: Diagnosis not present

## 2020-10-09 DIAGNOSIS — N83201 Unspecified ovarian cyst, right side: Secondary | ICD-10-CM | POA: Diagnosis not present

## 2020-10-09 DIAGNOSIS — N8110 Cystocele, unspecified: Secondary | ICD-10-CM

## 2020-10-09 DIAGNOSIS — N83202 Unspecified ovarian cyst, left side: Secondary | ICD-10-CM

## 2020-10-09 NOTE — Progress Notes (Signed)
GYNECOLOGY  VISIT   HPI: 75 y.o.   Married  Caucasian  female   [phone] with Patient's last menstrual period was 09/13/1984 (approximate).   here for pelvic ultrasound.  She is followed for a left ovarian cyst and a right adnexal cyst. Patient has been followed since 2015 when an ovarian cyst was detected on an MRI ordered to evaluate back pain.   Her first pelvic ultrasound showed a simple right ovarian cyst 14 mm and a simple left ovarian cyst 21 mm.  She has had normal CA125s.  Her last CA125 was 7.8 on 09/27/19.  Still having hip and lower back pain.  Now on gabapentin and muscle relaxer.   She can have some left sided lower abdominal pain, not often.  Her back pain is much more prominent.   Some leakages of urine with picking up heavy items or a sneeze.  Had a cystocele repair in the past.  Does not empty her bladder well.   Bowel control is good as long as she takes metamucil.   GYNECOLOGIC HISTORY: Patient's last menstrual period was 09/13/1984 (approximate). Contraception:  PMP Menopausal hormone therapy:  none Last mammogram: 05-22-20 3D/Neg/Birads1 Last pap smear:   07-26-18 Neg,06-27-17 Neg, 06-03-16 ASCUS:Neg HR HPV                OB History    Gravida  3   Para  3   Term  3   Preterm  0   AB  0   Living  3     SAB  0   IAB  0   Ectopic  0   Multiple  0   Live Births  3              Patient Active Problem List   Diagnosis Date Noted  . Primary localized osteoarthritis of right knee 10/19/2018  . Right foot pain 10/19/2018  . Incontinence of feces 09/21/2018  . Vulvar lesion 07/20/2018  . History of repair of right rotator cuff 03/03/2017  . Primary osteoarthritis of both hands/ has had bilateral [facility] surgery  03/03/2017  . History of hypertension 03/03/2017  . History of hyperlipidemia 03/03/2017  . DDD (degenerative disc disease), lumbar/ history of lumbar surgery  03/03/2017  . Osteopenia of multiple sites 03/03/2017  . Chronic left  shoulder pain 01/20/2017  . Primary osteoarthritis of both knees 01/20/2017  . Trapezius muscle spasm 01/20/2017  . Insomnia 01/20/2017  . Fatigue 01/20/2017  . Fibromyalgia 01/20/2017  . High risk medications (not anticoagulants) long-term use 01/20/2017  . PMR (polymyalgia rheumatica) (HCC) 01/20/2017  . Left ovarian cyst 06/02/2015  . Degenerative joint disease, shoulder, right 08/02/2013  . Precordial pain 02/11/2012  . HTN (hypertension) 02/11/2012  . Hyperlipidemia 02/11/2012    Past Medical History:  Diagnosis Date  . Bilateral ovarian cysts 09/12/2014   bilateral simple ovarian cysts.  [place] due in January 2018.  . 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 Fibromyalgia   . HTN (hypertension)    under control with meds., has been on med. x 20 yr.  . Hyperlipidemia   . Hypothyroid   . IBS (irritable bowel syndrome)   . Impingement syndrome of right shoulder 07/2013  . Polymyalgia rheumatica (HCC)    5-28  states she has been on prednisone and now is trying to only be on plaquenil to manage the polymyalgia   . PONV (postoperative nausea and vomiting)   . Primary localized osteoarthritis of right knee   .  Rupture of right rotator cuff 07/2013  . VIN I (vulvar intraepithelial neoplasia I) 2017, 2018   this is from a perineal/vulvar biopsy.  . Vitamin B12 deficiency     Past Surgical History:  Procedure Laterality Date  . ANTERIOR LAT LUMBAR FUSION  10/08/2008   L3-4  . BACK SURGERY    . BACK SURGERY  10-03-14   --Dr. Trey Sailors at [facility]  . COLONOSCOPY W/ BIOPSIES  11/2009   polyps  . COLONOSCOPY W/ BIOPSIES  11/2012   polyp, recheck in 3 years  . CYSTO  07/05/2006  . CYSTOCELE REPAIR  07/05/2006  . ESOPHAGOGASTRODUODENOSCOPY ENDOSCOPY  05/25/05   erythema and edema of gastric lumen  . EYE SURGERY Right    muscle adjustment   . FOOT SURGERY Right   . GANGLION CYST EXCISION Right    wrist  . INCONTINENCE SURGERY  07/05/2006    cystourethropexy  . ORIF FOREARM FRACTURE Right 09/2012  . SHOULDER ARTHROSCOPY WITH ROTATOR CUFF REPAIR AND SUBACROMIAL DECOMPRESSION Right 08/02/2013   Procedure: SHOULDER ARTHROSCOPY WITH ROTATOR CUFF REPAIR AND SUBACROMIAL DECOMPRESSION, EXTENSIVE DEBRIDEMENT (INCLUDING LABRUM), PARTIAL ACROMIPLASY WITH CORACOACROMIAL RELEASE, DISTAL CLAVICULECTOMY;  Surgeon: Loreta Ave, MD;  Location: [facility];  Service: Orthopedics;  Laterality: Right;  . STRABISMUS SURGERY Right 01/26/2008   2 other surgeries previously  . STRABISMUS SURGERY Right 06/03/2017   Procedure: REPAIR STRABISMUS RIGHT EYE;  Surgeon: Verne Carrow, MD;  Location: [facility];  Service: Ophthalmology;  Laterality: Right;  . Thumb surgery  2/13 & ?2010   Bilateral - states a joint was replaced  . TOTAL KNEE ARTHROPLASTY Right 02/12/2019   Procedure: TOTAL KNEE ARTHROPLASTY;  Surgeon: Salvatore Marvel, MD;  Location: [facility];  Service: Orthopedics;  Laterality: Right;  . TOTAL VAGINAL HYSTERECTOMY  1986 - or age 50   secondary AUB  . TUBAL LIGATION     laparosopic BTL age 43  . varicose veins Bilateral     Current Outpatient Medications  Medication Sig Dispense Refill  . benazepril (LOTENSIN) 40 MG tablet Take 40 mg by mouth daily.     . Calcium Carb-Cholecalciferol (CALCIUM 600 + D PO) Take 1 tablet by mouth daily.    . diclofenac sodium (VOLTAREN) 1 % GEL Apply 3 gm to 3 large joints up to 3 times a day.Dispense 3 tubes with 3 refills. (Patient taking differently: Apply 1 application topically 3 (three) times daily as needed (PAIN). Apply 3 gm to 3 large joints up to 3 times a day.Dispense 3 tubes with 3 refills.) 3 Tube 1  . gabapentin (NEURONTIN) 100 MG capsule SMARTSIG:1-3 Capsule(s) By Mouth Every 8 Hours PRN    . levothyroxine (SYNTHROID) 125 MCG tablet Take 125 mcg by mouth daily.    . Multiple Vitamin (MULTIVITAMIN) tablet Take 1 tablet by mouth daily.    . naproxen sodium (ALEVE) 220 MG  tablet Take 220 mg by mouth 2 (two) times daily as needed.    . predniSONE (DELTASONE) 5 MG tablet TAKE 1 TABLET BY MOUTH EVERY DAY WITH BREAKFAST 30 tablet 0  . tiZANidine (ZANAFLEX) 4 MG tablet Take 4 mg by mouth 3 (three) times daily.    . vitamin B-12 (CYANOCOBALAMIN) 100 MCG tablet Take 100 mcg by mouth daily.     No current facility-administered medications for this visit.     ALLERGIES: Penicillins and Sulfa antibiotics  Family History  Problem Relation Age of Onset  . Aortic aneurysm Father   . Hypertension  Father   . Coronary artery disease Mother 34  . Hypertension Mother   . Hyperlipidemia Brother        brain and kidney tumor, no treatment needed   . Aneurysm Brother   . Healthy Daughter   . Healthy Daughter   . Healthy Daughter     Social History   Socioeconomic History  . Marital status: Married    Spouse name: Not on file  . Number of children: 3  . Years of education: 61  . Highest education level: Not on file  Occupational History  . Occupation: retired  Tobacco Use  . Smoking status: Never Smoker  . Smokeless tobacco: Never Used  Vaping Use  . Vaping Use: Never used  Substance and Sexual Activity  . Alcohol use: No    Alcohol/week: 0.0 standard drinks  . Drug use: No  . Sexual activity: Not Currently    Partners: Male    Birth control/protection: Surgical    Comment: Hyst  Other Topics Concern  . Not on file  Social History Narrative   Lives at home with husband . One story home. Right handed   Social Determinants of Health   Financial Resource Strain: Not on file  Food Insecurity: Not on file  Transportation Needs: Not on file  Physical Activity: Not on file  Stress: Not on file  Social Connections: Not on file  Intimate Partner Violence: Not on file    Review of Systems  All other systems reviewed and are negative.   PHYSICAL EXAMINATION:    BP (!) 144/78   Pulse 63   Ht 5\' 8"  (1.727 m)   Wt 190 lb (86.2 kg)   LMP 09/13/1984  (Approximate)   SpO2 98%   BMI 28.89 kg/m     General appearance: alert, cooperative and appears stated age   Pelvic: External genitalia:  no lesions              Urethra:  normal appearing urethra with no masses, tenderness or lesions              Bartholins and Skenes: normal                 Vagina: normal appearing vagina with normal color and discharge, no lesions              Cervix: absent                Bimanual Exam:  Uterus: absent              Adnexa: no mass, fullness, tenderness on right.  3 cm smooth mass on left, slightly tender.              Rectal exam: Yes.  .  Confirms.              Anus:  normal sphincter tone, no lesions  Chaperone was present for exam. Pelvic 11/11/1984 Uterus absent.  Left ovarian simple cyst, 2.8 mm.  Right adnexal cyst 1.4 x 1.0 cm.  No free fluid.   ASSESSMENT  Left ovarian cyst.  Increasing slightly in size since onset of dx in 2015.  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 Right adnexal cyst.  Stable. First degree cystocele.  Status post cystourethropexy, cystocele repair. Stress incontinence.   PLAN  We discussed her ovarian and adnexal cysts. Ultrasound report and images reviewed. Ok to continue observational management with yearly 2016 if CA125 is normal.  We discussed laparoscopic BSO.  Would consider anterior  colporrhaphy and potential midurethral sling at the time of her laparoscopy.  She would need urodynamic testing in advance of bladder reconstructive surgery.   Fu for annual exam and yearly pelvic US.   30 min  total time was spent for this patient encounter, including preparation, face-to-face counseling with the patient, coordination of care, and documentation of the encounter.

## 2020-10-10 LAB — CA 125: CA 125: 8 U/mL (ref <35)

## 2020-10-24 NOTE — Progress Notes (Addendum)
Office Visit Note  Patient: [name]             Date of Birth: 05/28/1946           MRN: [id number]             PCP: Kaleen Mask, MD Referring: Kaleen Mask, * Visit Date: 10/27/2020 Occupation: @GUAROCC @  Subjective:  Pain in multiple joints   History of Present Illness: ANNASTON Morgan is a 75 y.o. female with history of PMR and osteoarthritis.  She is not taking any immunosuppressive agents and is not currently on long term prednisone.  She takes prednisone very sparingly during flares.  She states her last dose of prednisone was in December 2021.  She states she continues to have chronic lower back pain and stiffness.  She had an injection about 4 weeks ago at her orthopedists office.  She also has ongoing trochanteric bursitis of the right hip and had a recent cortisone injection as well. She has not been performing stretching exercises recently.  She used to go to water aerobics on a regular basis which alleviated a lot of her myalgias, arthralgias, and joint stiffness. She has been apprehensive to return to the pool during the pandemic.  She has been experiencing increased pain in the replaced right knee and has also noticed warmth and swelling.  She plans on following up with Dr. January 2022. She uses voltaren gel topically as needed for pain relief. She had an updated DEXA recently, and she is currently awaiting approval to start on prolia.  According to the patient she fractured her pelvis last year. She is taking calcium and vitamin D supplements daily.  She reports several months ago she developed a rash on bilateral forearms, R>L.  She states it has not been itching or irritated.  She has tried applying cortisone cream without any improvement.  She was evaluated by Dr. Thurston Hole on 08/27/20.  She is awaiting an appointment with Dr. 08/29/20 for further evaluation.     Activities of Daily Living:  Patient reports morning stiffness for all day. Patient Reports  nocturnal pain.  Difficulty dressing/grooming: Denies Difficulty climbing stairs: Reports Difficulty getting out of chair: Reports Difficulty using hands for taps, buttons, cutlery, and/or writing: Denies  Review of Systems  Constitutional: Negative for fatigue.  HENT: Negative for mouth sores, mouth dryness and nose dryness.   Eyes: Negative for pain, itching and dryness.  Respiratory: Negative for shortness of breath and difficulty breathing.   Cardiovascular: Negative for chest pain and palpitations.  Gastrointestinal: Negative for blood in stool, constipation and diarrhea.  Endocrine: Negative for increased urination.  Genitourinary: Negative for difficulty urinating.  Musculoskeletal: Positive for arthralgias, joint pain, joint swelling, myalgias, muscle weakness, morning stiffness, muscle tenderness and myalgias.  Skin: Positive for rash and hair loss. Negative for color change.  Allergic/Immunologic: Negative for susceptible to infections.  Neurological: Negative for dizziness, numbness, headaches, memory loss and weakness.  Hematological: Negative for bruising/bleeding tendency.  Psychiatric/Behavioral: Negative for confusion.    PMFS History:  Patient Active Problem List   Diagnosis Date Noted  . Primary localized osteoarthritis of right knee 10/19/2018  . Right foot pain 10/19/2018  . Incontinence of feces 09/21/2018  . Vulvar lesion 07/20/2018  . History of repair of right rotator cuff 03/03/2017  . Primary osteoarthritis of both hands/ has had bilateral CMC surgery  03/03/2017  . History of hypertension 03/03/2017  . History of hyperlipidemia 03/03/2017  . DDD (degenerative disc  disease), lumbar/ history of lumbar surgery  03/03/2017  . Osteopenia of multiple sites 03/03/2017  . Chronic left shoulder pain 01/20/2017  . Primary osteoarthritis of both knees 01/20/2017  . Trapezius muscle spasm 01/20/2017  . Insomnia 01/20/2017  . Fatigue 01/20/2017  . Fibromyalgia  01/20/2017  . High risk medications (not anticoagulants) long-term use 01/20/2017  . PMR (polymyalgia rheumatica) (HCC) 01/20/2017  . Left ovarian cyst 06/02/2015  . Degenerative joint disease, shoulder, right 08/02/2013  . Precordial pain 02/11/2012  . HTN (hypertension) 02/11/2012  . Hyperlipidemia 02/11/2012    Past Medical History:  Diagnosis Date  . Bilateral ovarian cysts 09/12/2014   bilateral simple ovarian cysts.  [place] due in January 2018.  . 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 Fibromyalgia   . HTN (hypertension)    under control with meds., has been on med. x 20 yr.  . Hyperlipidemia   . Hypothyroid   . IBS (irritable bowel syndrome)   . Impingement syndrome of right shoulder 07/2013  . Polymyalgia rheumatica (HCC)    5-28  states she has been on prednisone and now is trying to only be on plaquenil to manage the polymyalgia   . PONV (postoperative nausea and vomiting)   . Primary localized osteoarthritis of right knee   . Rupture of right rotator cuff 07/2013  . VIN I (vulvar intraepithelial neoplasia I) 2017, 2018   this is from a perineal/vulvar biopsy.  . Vitamin B12 deficiency     Family History  Problem Relation Age of Onset  . Aortic aneurysm Father   . Hypertension Father   . Coronary artery disease Mother 19  . Hypertension Mother   . Hyperlipidemia Brother        brain and kidney tumor, no treatment needed   . Aneurysm Brother   . Healthy Daughter   . Healthy Daughter   . Healthy Daughter    Past Surgical History:  Procedure Laterality Date  . ANTERIOR LAT LUMBAR FUSION  10/08/2008   L3-4  . BACK SURGERY    . BACK SURGERY  10-03-14   --Dr. Trey Sailors at [facility]  . COLONOSCOPY W/ BIOPSIES  11/2009   polyps  . COLONOSCOPY W/ BIOPSIES  11/2012   polyp, recheck in 3 years  . CYSTO  07/05/2006  . CYSTOCELE REPAIR  07/05/2006  . ESOPHAGOGASTRODUODENOSCOPY ENDOSCOPY  05/25/05   erythema and edema of gastric lumen  . EYE  SURGERY Right    muscle adjustment   . FOOT SURGERY Right   . GANGLION CYST EXCISION Right    wrist  . INCONTINENCE SURGERY  07/05/2006   cystourethropexy  . ORIF FOREARM FRACTURE Right 09/2012  . SHOULDER ARTHROSCOPY WITH ROTATOR CUFF REPAIR AND SUBACROMIAL DECOMPRESSION Right 08/02/2013   Procedure: SHOULDER ARTHROSCOPY WITH ROTATOR CUFF REPAIR AND SUBACROMIAL DECOMPRESSION, EXTENSIVE DEBRIDEMENT (INCLUDING LABRUM), PARTIAL ACROMIPLASY WITH CORACOACROMIAL RELEASE, DISTAL CLAVICULECTOMY;  Surgeon: Loreta Ave, MD;  Location: [facility];  Service: Orthopedics;  Laterality: Right;  . STRABISMUS SURGERY Right 01/26/2008   2 other surgeries previously  . STRABISMUS SURGERY Right 06/03/2017   Procedure: REPAIR STRABISMUS RIGHT EYE;  Surgeon: Verne Carrow, MD;  Location: [facility];  Service: Ophthalmology;  Laterality: Right;  . Thumb surgery  2/13 & ?2010   Bilateral - states a joint was replaced  . TOTAL KNEE ARTHROPLASTY Right 02/12/2019   Procedure: TOTAL KNEE ARTHROPLASTY;  Surgeon: Salvatore Marvel, MD;  Location: [facility];  Service: Orthopedics;  Laterality:  Right;  Marland Kitchen TOTAL VAGINAL HYSTERECTOMY  1986 - or age 63   secondary AUB  . TUBAL LIGATION     laparosopic BTL age 68  . varicose veins Bilateral    Social History   Social History Narrative   Lives at home with husband . One story home. Right handed   Immunization History  Administered Date(s) Administered  . Influenza-Unspecified 08/04/2017  . PFIZER(Purple Top)SARS-COV-2 Vaccination 11/15/2019, 12/12/2019, 06/16/2020  . Pneumococcal Conjugate-13 05/14/2014, 05/15/2015  . Tdap 09/13/2012     Objective: Vital Signs: BP (!) 167/92 (BP Location: Left Arm, Patient Position: Sitting, Cuff Size: Normal)   Pulse 69   Resp 13   Ht 5\' 8"  (1.727 m)   Wt 193 lb 9.6 oz (87.8 kg)   LMP 09/13/1984 (Approximate)   BMI 29.44 kg/m    Physical Exam Vitals and nursing note reviewed.  Constitutional:       Appearance: She is well-developed and well-nourished.  HENT:     Head: Normocephalic and atraumatic.  Eyes:     Extraocular Movements: EOM normal.     Conjunctiva/sclera: Conjunctivae normal.  Cardiovascular:     Pulses: Intact distal pulses.  Pulmonary:     Effort: Pulmonary effort is normal.  Abdominal:     Palpations: Abdomen is soft.  Musculoskeletal:     Cervical back: Normal range of motion.  Skin:    General: Skin is warm and dry.     Capillary Refill: Capillary refill takes less than 2 seconds.  Neurological:     Mental Status: She is alert and oriented to person, place, and time.  Psychiatric:        Mood and Affect: Mood and affect normal.        Behavior: Behavior normal.      Musculoskeletal Exam: C-spine limited ROM with lateral rotation.  Trapezius muscle tension and tenderness bilaterally.  Midline spinal tenderness in the lumbar region and over both SI joints.  Shoulder joints good ROM with some discomfort in the left shoulder.  Elbow joints, wrist joints, MCPs, PIPs, and DIPs good ROM with no synovitis.  PIP and DIP thickening consistent with osteoarthritis of both hands. Ulnar styloid prominence bilaterally.  Hip joints have slightly limited ROM bilaterally.  Tenderness over the right trochanteric bursa.  Right knee replacement has warmth and a moderate effusion.  Left knee joint has good ROM with crepitus but no warmth or effusion.  Ankle joints good ROM with no tenderness or inflammation.   CDAI Exam: CDAI Score: - Patient Global: -; Provider Global: - Swollen: -; Tender: - Joint Exam 10/27/2020   No joint exam has been documented for this visit   There is currently no information documented on the homunculus. Go to the Rheumatology activity and complete the homunculus joint exam.  Investigation: No additional findings.  Imaging: No results found.  Recent Labs: Lab Results  Component Value Date   WBC 5.8 12/05/2019   HGB 11.4 (L) 12/05/2019   PLT  297 12/05/2019   NA 143 12/05/2019   K 4.4 12/05/2019   CL 110 12/05/2019   CO2 25 12/05/2019   GLUCOSE 96 12/05/2019   BUN 14 12/05/2019   CREATININE 0.64 12/05/2019   BILITOT 0.3 12/05/2019   ALKPHOS 73 02/08/2019   AST 18 12/05/2019   ALT 14 12/05/2019   PROT 6.9 12/05/2019   ALBUMIN 4.1 02/08/2019   CALCIUM 9.3 12/05/2019   GFRAA 103 12/05/2019    Speciality Comments: PLQ Eye Exam: 04/07/2020 WNL @  [facility] Follow up in 1 year  Procedures:  No procedures performed Allergies: Penicillins and Sulfa antibiotics   Assessment / Plan:     Visit Diagnoses: PMR (polymyalgia rheumatica) (HCC) - She has not had any signs or symptoms of a polymyalgia rheumatica flare recently.  She is not taking any immunosuppressive agents at this time.  She takes prednisone 5 mg very sparingly if she develops signs or symptoms of a flare.  Her last dose of prednisone was in December 2021.  She has full strength of upper and lower extremities on examination today.  She has generalized myalgias and muscle tenderness due to underlying fibromyalgia but has not had any increased muscle pain recently.  We discussed the importance of regular exercise and stretching on a daily basis.  She requested to have a sed rate checked today.  She was advised to notify us if she develops signs or symptoms of a flare.  She will follow-up in the office in 975-months.  plan: Sedimentation rate  High risk medications (not anticoagulants) long-term use - PLQ d/c Feb 2021 due to not noticing any clinical benefit. She takes prednisone 5 mg daily as needed.  She has been trying to avoid taking prednisone.  Her last dose of prednisone was in December 2021.  We will check a BMP today.- Plan: BASIC METABOLIC PANEL WITH GFR  Primary osteoarthritis of both hands/ has had bilateral CMC surgery: She has PIP and DIP thickening consistent with osteoarthritis of both hands.  No tenderness or inflammation was noted.  She was able to make a  complete fist bilaterally.  Joint protection and muscle strengthening were discussed.   S/P TKR (total knee replacement), right: She has been experiencing increased pain and swelling in the right knee joint over the past several months.  She has not had any recent injuries or falls.  She presents today with warmth and a moderate effusion of the right knee.  She was advised to schedule an appointment with Dr. Thurston HoleWainer.   Primary osteoarthritis of left knee: She has good range of motion of the left knee joint with crepitus.  No warmth or effusion was noted.  Primary osteoarthritis of right shoulder: She has good range of motion of the right shoulder joint on exam.  She has tenderness palpation but no effusion was noted.  She was given a handout of shoulder joint exercises to perform.  Chronic left shoulder pain: She has been experiencing increased discomfort in the left shoulder.  Her discomfort is exacerbated by laying on her left side at night.  She has good range of motion on examination today.  She has full strength of bilateral upper extremities.  She was given a handout of shoulder joint exercises to perform.  Trapezius muscle spasm: She has trapezius muscle tension and muscle tenderness bilaterally.  She takes tizanidine 4 mg a mouth at bedtime as needed for muscle spasms.  Fibromyalgia: She has generalized hyperalgesia and positive tender points on exam.  She is trapezius muscle tension and muscle tenderness bilaterally.  She has ongoing discomfort due to trochanter bursitis of both hips, right greater than left.  She was encouraged to perform stretching exercises on a daily basis.  She declined physical therapy at this time.  We discussed the importance of regular exercise and good sleep hygiene.  She plans on continuing to take gabapentin 300 mg by mouth at bedtime and tizanidine 4 mg a mouth at bedtime as needed for muscle spasms.  History of repair  of right rotator cuff: Doing well. She has  good ROM with no discomfort.   DDD (degenerative disc disease), lumbar: Chronic pain.  She had an injection about 4 weeks ago at her orthopedists office.   Other insomnia: She takes gabapentin 300 mg at bedtime.    Other fatigue: Chronic and secondary to insomnia.  Discussed the importance of regular exercise.   Osteopenia of multiple sites - DEXA at [facility] [place] on 07/30/18: BMD at Left FN 0.851 with T-score -1.3.  She has had an updated DEXA, so we will call to obtain these results.  She continues to take a calcium and vitamin D supplement daily.  According to the patient the plan is to start her on prolia 60 mg sq injections every 6 months, but she is awaiting approval by her insurance and patient assistance. We will check BMP and vitamin D level today. Discussed the importance of resistive exercises.  Plan: VITAMIN D 25 Hydroxy (Vit-D Deficiency, Fractures)   Rash and other nonspecific skin eruption: She developed a rash on bilateral forearms several months ago, R arm> left arm.  She has not been using any new products and has been unable to identify a trigger.  She has tried topical cortisone cream but has not noticed any improvement in her symptoms.  She was evaluated by Dr. Talmage Nap and has an upcoming with Dr. Jorja Loa for further evaluation.   Other medical conditions are listed as follows:   History of hypertension  History of hyperlipidemia  Orders: Orders Placed This Encounter  Procedures  . Sedimentation rate  . VITAMIN D 25 Hydroxy (Vit-D Deficiency, Fractures)  . BASIC METABOLIC PANEL WITH GFR   Meds ordered this encounter  Medications  . diclofenac Sodium (VOLTAREN) 1 % GEL    Sig: Apply 2-4 grams to affected joint 4 times daily as needed.    Dispense:  400 g    Refill:  2     Follow-Up Instructions: Return in about 5 months (around 03/26/2021) for Polymyalgia Rheumatica, Osteoarthritis.   Gearldine Bienenstock, PA-C  Note - This record has been created using  Dragon software.  Chart creation errors have been sought, but may not always  have been located. Such creation errors do not reflect on  the standard of medical care.

## 2020-10-27 ENCOUNTER — Other Ambulatory Visit: Payer: Self-pay

## 2020-10-27 ENCOUNTER — Encounter: Payer: Self-pay | Admitting: Physician Assistant

## 2020-10-27 ENCOUNTER — Ambulatory Visit: Payer: Medicare Other | Admitting: Physician Assistant

## 2020-10-27 VITALS — BP 167/92 | HR 69 | Resp 13 | Ht 68.0 in | Wt 193.6 lb

## 2020-10-27 DIAGNOSIS — Z79899 Other long term (current) drug therapy: Secondary | ICD-10-CM | POA: Diagnosis not present

## 2020-10-27 DIAGNOSIS — M353 Polymyalgia rheumatica: Secondary | ICD-10-CM | POA: Diagnosis not present

## 2020-10-27 DIAGNOSIS — M19041 Primary osteoarthritis, right hand: Secondary | ICD-10-CM | POA: Diagnosis not present

## 2020-10-27 DIAGNOSIS — R5383 Other fatigue: Secondary | ICD-10-CM

## 2020-10-27 DIAGNOSIS — M5136 Other intervertebral disc degeneration, lumbar region: Secondary | ICD-10-CM

## 2020-10-27 DIAGNOSIS — Z9889 Other specified postprocedural states: Secondary | ICD-10-CM

## 2020-10-27 DIAGNOSIS — M62838 Other muscle spasm: Secondary | ICD-10-CM

## 2020-10-27 DIAGNOSIS — Z96651 Presence of right artificial knee joint: Secondary | ICD-10-CM

## 2020-10-27 DIAGNOSIS — M19042 Primary osteoarthritis, left hand: Secondary | ICD-10-CM

## 2020-10-27 DIAGNOSIS — Z8679 Personal history of other diseases of the circulatory system: Secondary | ICD-10-CM

## 2020-10-27 DIAGNOSIS — Z8639 Personal history of other endocrine, nutritional and metabolic disease: Secondary | ICD-10-CM

## 2020-10-27 DIAGNOSIS — G4709 Other insomnia: Secondary | ICD-10-CM

## 2020-10-27 DIAGNOSIS — M19011 Primary osteoarthritis, right shoulder: Secondary | ICD-10-CM

## 2020-10-27 DIAGNOSIS — R21 Rash and other nonspecific skin eruption: Secondary | ICD-10-CM

## 2020-10-27 DIAGNOSIS — G8929 Other chronic pain: Secondary | ICD-10-CM

## 2020-10-27 DIAGNOSIS — M1712 Unilateral primary osteoarthritis, left knee: Secondary | ICD-10-CM

## 2020-10-27 DIAGNOSIS — M25512 Pain in left shoulder: Secondary | ICD-10-CM

## 2020-10-27 DIAGNOSIS — M797 Fibromyalgia: Secondary | ICD-10-CM

## 2020-10-27 DIAGNOSIS — M8589 Other specified disorders of bone density and structure, multiple sites: Secondary | ICD-10-CM

## 2020-10-27 MED ORDER — DICLOFENAC SODIUM 1 % EX GEL: CUTANEOUS | 2 refills | Status: DC

## 2020-10-27 NOTE — Patient Instructions (Signed)
Shoulder Exercises Ask your health care provider which exercises are safe for you. Do exercises exactly as told by your health care provider and adjust them as directed. It is normal to feel mild stretching, pulling, tightness, or discomfort as you do these exercises. Stop right away if you feel sudden pain or your pain gets worse. Do not begin these exercises until told by your health care provider. Stretching exercises External rotation and abduction This exercise is sometimes called corner stretch. This exercise rotates your arm outward (external rotation) and moves your arm out from your body (abduction). 1. Stand in a doorway with one of your feet slightly in front of the other. This is called a staggered stance. If you cannot reach your forearms to the door frame, stand facing a corner of a room. 2. Choose one of the following positions as told by your health care provider: ? Place your hands and forearms on the door frame above your head. ? Place your hands and forearms on the door frame at the height of your head. ? Place your hands on the door frame at the height of your elbows. 3. Slowly move your weight onto your front foot until you feel a stretch across your chest and in the front of your shoulders. Keep your head and chest upright and keep your abdominal muscles tight. 4. Hold for __________ seconds. 5. To release the stretch, shift your weight to your back foot. Repeat __________ times. Complete this exercise __________ times a day.   Extension, standing 1. Stand and hold a broomstick, a cane, or a similar object behind your back. ? Your hands should be a little wider than shoulder width apart. ? Your palms should face away from your back. 2. Keeping your elbows straight and your shoulder muscles relaxed, move the stick away from your body until you feel a stretch in your shoulders (extension). ? Avoid shrugging your shoulders while you move the stick. Keep your shoulder blades  tucked down toward the middle of your back. 3. Hold for __________ seconds. 4. Slowly return to the starting position. Repeat __________ times. Complete this exercise __________ times a day. Range-of-motion exercises Pendulum 1. Stand near a wall or a surface that you can hold onto for balance. 2. Bend at the waist and let your left / right arm hang straight down. Use your other arm to support you. Keep your back straight and do not lock your knees. 3. Relax your left / right arm and shoulder muscles, and move your hips and your trunk so your left / right arm swings freely. Your arm should swing because of the motion of your body, not because you are using your arm or shoulder muscles. 4. Keep moving your hips and trunk so your arm swings in the following directions, as told by your health care provider: ? Side to side. ? Forward and backward. ? In clockwise and counterclockwise circles. 5. Continue each motion for __________ seconds, or for as long as told by your health care provider. 6. Slowly return to the starting position. Repeat __________ times. Complete this exercise __________ times a day.   Shoulder flexion, standing 1. Stand and hold a broomstick, a cane, or a similar object. Place your hands a little more than shoulder width apart on the object. Your left / right hand should be palm up, and your other hand should be palm down. 2. Keep your elbow straight and your shoulder muscles relaxed. Push the stick up with your healthy   arm to raise your left / right arm in front of your body, and then over your head until you feel a stretch in your shoulder (flexion). ? Avoid shrugging your shoulder while you raise your arm. Keep your shoulder blade tucked down toward the middle of your back. 3. Hold for __________ seconds. 4. Slowly return to the starting position. Repeat __________ times. Complete this exercise __________ times a day.   Shoulder abduction, standing 1. Stand and hold a  broomstick, a cane, or a similar object. Place your hands a little more than shoulder width apart on the object. Your left / right hand should be palm up, and your other hand should be palm down. 2. Keep your elbow straight and your shoulder muscles relaxed. Push the object across your body toward your left / right side. Raise your left / right arm to the side of your body (abduction) until you feel a stretch in your shoulder. ? Do not raise your arm above shoulder height unless your health care provider tells you to do that. ? If directed, raise your arm over your head. ? Avoid shrugging your shoulder while you raise your arm. Keep your shoulder blade tucked down toward the middle of your back. 3. Hold for __________ seconds. 4. Slowly return to the starting position. Repeat __________ times. Complete this exercise __________ times a day. Internal rotation 1. Place your left / right hand behind your back, palm up. 2. Use your other hand to dangle an exercise band, a towel, or a similar object over your shoulder. Grasp the band with your left / right hand so you are holding on to both ends. 3. Gently pull up on the band until you feel a stretch in the front of your left / right shoulder. The movement of your arm toward the center of your body is called internal rotation. ? Avoid shrugging your shoulder while you raise your arm. Keep your shoulder blade tucked down toward the middle of your back. 4. Hold for __________ seconds. 5. Release the stretch by letting go of the band and lowering your hands. Repeat __________ times. Complete this exercise __________ times a day.   Strengthening exercises External rotation 1. Sit in a stable chair without armrests. 2. Secure an exercise band to a stable object at elbow height on your left / right side. 3. Place a soft object, such as a folded towel or a small pillow, between your left / right upper arm and your body to move your elbow about 4 inches (10  cm) away from your side. 4. Hold the end of the exercise band so it is tight and there is no slack. 5. Keeping your elbow pressed against the soft object, slowly move your forearm out, away from your abdomen (external rotation). Keep your body steady so only your forearm moves. 6. Hold for __________ seconds. 7. Slowly return to the starting position. Repeat __________ times. Complete this exercise __________ times a day.   Shoulder abduction 1. Sit in a stable chair without armrests, or stand up. 2. Hold a __________ weight in your left / right hand, or hold an exercise band with both hands. 3. Start with your arms straight down and your left / right palm facing in, toward your body. 4. Slowly lift your left / right hand out to your side (abduction). Do not lift your hand above shoulder height unless your health care provider tells you that this is safe. ? Keep your arms straight. ? Avoid   shrugging your shoulder while you do this movement. Keep your shoulder blade tucked down toward the middle of your back. 5. Hold for __________ seconds. 6. Slowly lower your arm, and return to the starting position. Repeat __________ times. Complete this exercise __________ times a day.   Shoulder extension 1. Sit in a stable chair without armrests, or stand up. 2. Secure an exercise band to a stable object in front of you so it is at shoulder height. 3. Hold one end of the exercise band in each hand. Your palms should face each other. 4. Straighten your elbows and lift your hands up to shoulder height. 5. Step back, away from the secured end of the exercise band, until the band is tight and there is no slack. 6. Squeeze your shoulder blades together as you pull your hands down to the sides of your thighs (extension). Stop when your hands are straight down by your sides. Do not let your hands go behind your body. 7. Hold for __________ seconds. 8. Slowly return to the starting position. Repeat __________  times. Complete this exercise __________ times a day. Shoulder row 1. Sit in a stable chair without armrests, or stand up. 2. Secure an exercise band to a stable object in front of you so it is at waist height. 3. Hold one end of the exercise band in each hand. Position your palms so that your thumbs are facing the ceiling (neutral position). 4. Bend each of your elbows to a 90-degree angle (right angle) and keep your upper arms at your sides. 5. Step back until the band is tight and there is no slack. 6. Slowly pull your elbows back behind you. 7. Hold for __________ seconds. 8. Slowly return to the starting position. Repeat __________ times. Complete this exercise __________ times a day. Shoulder press-ups 1. Sit in a stable chair that has armrests. Sit upright, with your feet flat on the floor. 2. Put your hands on the armrests so your elbows are bent and your fingers are pointing forward. Your hands should be about even with the sides of your body. 3. Push down on the armrests and use your arms to lift yourself off the chair. Straighten your elbows and lift yourself up as much as you comfortably can. ? Move your shoulder blades down, and avoid letting your shoulders move up toward your ears. ? Keep your feet on the ground. As you get stronger, your feet should support less of your body weight as you lift yourself up. 4. Hold for __________ seconds. 5. Slowly lower yourself back into the chair. Repeat __________ times. Complete this exercise __________ times a day.   Wall push-ups 1. Stand so you are facing a stable wall. Your feet should be about one arm-length away from the wall. 2. Lean forward and place your palms on the wall at shoulder height. 3. Keep your feet flat on the floor as you bend your elbows and lean forward toward the wall. 4. Hold for __________ seconds. 5. Straighten your elbows to push yourself back to the starting position. Repeat __________ times. Complete this  exercise __________ times a day.   This information is not intended to replace advice given to you by your health care provider. Make sure you discuss any questions you have with your health care provider. Document Revised: 12/22/2018 Document Reviewed: 09/29/2018 [employer] Patient Education  2021 [employer] Hip Bursitis Rehab Ask your health care provider which exercises are safe for you. Do exercises exactly as  as told by your health care provider and adjust them as directed. It is normal to feel mild stretching, pulling, tightness, or discomfort as you do these exercises. Stop right away if you feel sudden pain or your pain gets worse. Do not begin these exercises until told by your health care provider. Stretching exercise This exercise warms up your muscles and joints and improves the movement and flexibility of your hip. This exercise also helps to relieve pain and stiffness. Iliotibial band stretch An iliotibial band is a strong band of muscle tissue that runs from the outer side of your hip to the outer side of your thigh and knee. 1. Lie on your side with your left / right leg in the top position. 2. Bend your left / right knee and grab your ankle. Stretch out your bottom arm to help you balance. 3. Slowly bring your knee back so your thigh is behind your body. 4. Slowly lower your knee toward the floor until you feel a gentle stretch on the outside of your left / right thigh. If you do not feel a stretch and your knee will not fall farther, place the heel of your other foot on top of your knee and pull your knee down toward the floor with your foot. 5. Hold this position for __________ seconds. 6. Slowly return to the starting position. Repeat __________ times. Complete this exercise __________ times a day.   Strengthening exercises These exercises build strength and endurance in your hip and pelvis. Endurance is the ability to use your muscles for a long time, even after they get  tired. Bridge This exercise strengthens the muscles that move your thigh backward (hip extensors). 1. Lie on your back on a firm surface with your knees bent and your feet flat on the floor. 2. Tighten your buttocks muscles and lift your buttocks off the floor until your trunk is level with your thighs. ? Do not arch your back. ? You should feel the muscles working in your buttocks and the back of your thighs. If you do not feel these muscles, slide your feet 1-2 inches (2.5-5 cm) farther away from your buttocks. ? If this exercise is too easy, try doing it with your arms crossed over your chest. 3. Hold this position for __________ seconds. 4. Slowly lower your hips to the starting position. 5. Let your muscles relax completely after each repetition. Repeat __________ times. Complete this exercise __________ times a day.   Squats This exercise strengthens the muscles in front of your thigh and knee (quadriceps). 1. Stand in front of a table, with your feet and knees pointing straight ahead. You may rest your hands on the table for balance but not for support. 2. Slowly bend your knees and lower your hips like you are going to sit in a chair. ? Keep your weight over your heels, not over your toes. ? Keep your lower legs upright so they are parallel with the table legs. ? Do not let your hips go lower than your knees. ? Do not bend lower than told by your health care provider. ? If your hip pain increases, do not bend as low. 3. Hold the squat position for __________ seconds. 4. Slowly push with your legs to return to standing. Do not use your hands to pull yourself to standing. Repeat __________ times. Complete this exercise __________ times a day. Hip hike 1. Stand sideways on a bottom step. Stand on your left / right leg with   your other foot unsupported next to the step. You can hold on to the railing or wall for balance if needed. 2. Keep your knees straight and your torso square. Then  lift your left / right hip up toward the ceiling. 3. Hold this position for __________ seconds. 4. Slowly let your left / right hip lower toward the floor, past the starting position. Your foot should get closer to the floor. Do not lean or bend your knees. Repeat __________ times. Complete this exercise __________ times a day. Single leg stand 1. Without shoes, stand near a railing or in a doorway. You may hold on to the railing or door frame as needed for balance. 2. Squeeze your left / right buttock muscles, then lift up your other foot. ? Do not let your left / right hip push out to the side. ? It is helpful to stand in front of a mirror for this exercise so you can watch your hip. 3. Hold this position for __________ seconds. Repeat __________ times. Complete this exercise __________ times a day. This information is not intended to replace advice given to you by your health care provider. Make sure you discuss any questions you have with your health care provider. Document Revised: 12/25/2018 Document Reviewed: 12/25/2018 [employer] Patient Education  2021 [employer]  

## 2020-10-28 LAB — BASIC METABOLIC PANEL WITH GFR
BUN: 13 mg/dL (ref 7–25)
CO2: 30 mmol/L (ref 20–32)
Calcium: 9.3 mg/dL (ref 8.6–10.4)
Chloride: 107 mmol/L (ref 98–110)
Creat: 0.64 mg/dL (ref 0.60–0.93)
GFR, Est African American: 103 mL/min/{1.73_m2} (ref >=60)
GFR, Est Non African American: 89 mL/min/{1.73_m2} (ref >=60)
Glucose, Bld: 85 mg/dL (ref 65–99)
Potassium: 4.6 mmol/L (ref 3.5–5.3)
Sodium: 143 mmol/L (ref 135–146)

## 2020-10-28 LAB — SEDIMENTATION RATE: Sed Rate: 22 mm/h (ref 0–30)

## 2020-10-28 LAB — VITAMIN D 25 HYDROXY (VIT D DEFICIENCY, FRACTURES): Vit D, 25-Hydroxy: 55 ng/mL (ref 30–100)

## 2020-10-28 NOTE — Progress Notes (Signed)
ESR WNL.  Vitamin D WNL-ok to continue maintenance dose of vitamin D.  BMP WNL. Please forward lab work to Dr. Chalmers Cater.

## 2020-11-06 ENCOUNTER — Ambulatory Visit: Payer: Medicare Other | Admitting: Physician Assistant

## 2021-01-05 ENCOUNTER — Other Ambulatory Visit: Payer: Self-pay | Admitting: Rheumatology

## 2021-01-05 MED ORDER — PREDNISONE 5 MG PO TABS: ORAL_TABLET | ORAL | 0 refills | Status: DC

## 2021-01-05 NOTE — Telephone Encounter (Signed)
Patient states doctor is allowing her to have a rx for Prednisone on hand for when she needs it. Patient requesting an rx for 5 mg to be sent into [facility] on [address]

## 2021-01-05 NOTE — Telephone Encounter (Signed)
Please advise 

## 2021-01-05 NOTE — Telephone Encounter (Signed)
Dr. Corliss Skains sent in a prescription for prednisone 5 mg 1 tablet by mouth daily, dispensed 30 tablets with zero refills on 06/03/20.  Ok to refill. Please advise the patient to take prednisone with food in the morning.

## 2021-01-05 NOTE — Telephone Encounter (Signed)
Next Visit: 03/25/2021  Last Visit: 10/27/2020  Last Fill: 06/03/2020  Dx: PMR (polymyalgia rheumatica)  Current Dose per office note on 06/03/2020, not mentioned  Okay to refill prednisone?

## 2021-03-11 NOTE — Progress Notes (Signed)
Office Visit Note  Patient: [name]             Date of Birth: 06/22/1946           MRN: [id number]             PCP: Kaleen MaskElkins, Wilson Oliver, MD Referring: Kaleen MaskElkins, Wilson Oliver, * Visit Date: 03/25/2021 Occupation: @GUAROCC @  Subjective:  Pain in multiple joints.   History of Present Illness: [name] is a 75 y.o. female history of polymyalgia rheumatica, osteoarthritis, degenerative disc disease and fibromyalgia syndrome.  She denies any increased muscular weakness or tenderness.  She is taking prednisone 5 mg p.o. as needed for polymyalgia rheumatica.  She is unable to taper prednisone.  She states she recently had increased swelling in her right replaced knee joint.  She was seen by orthopedic surgeon and had aspiration and cortisone injection.  She also had lumbar spine injection about 2 months ago and her back is feeling better.  She continues to have some stiffness in her hands and her shoulder joints.  Activities of Daily Living:  Patient reports morning stiffness for 2 hours.   Patient Denies nocturnal pain.  Difficulty dressing/grooming: Denies Difficulty climbing stairs: Reports Difficulty getting out of chair: Reports Difficulty using hands for taps, buttons, cutlery, and/or writing: Denies  Review of Systems  Constitutional:  Negative for fatigue.  HENT:  Negative for mouth sores, mouth dryness and nose dryness.   Eyes:  Negative for pain, itching and dryness.  Respiratory:  Negative for shortness of breath and difficulty breathing.   Cardiovascular:  Negative for chest pain and palpitations.  Gastrointestinal:  Negative for blood in stool, constipation and diarrhea.  Endocrine: Negative for increased urination.  Genitourinary:  Negative for difficulty urinating.  Musculoskeletal:  Positive for joint pain, joint pain, myalgias, morning stiffness, muscle tenderness and myalgias. Negative for joint swelling.  Skin:  Negative for color change, rash and  redness.  Allergic/Immunologic: Negative for susceptible to infections.  Neurological:  Negative for dizziness, numbness, headaches, memory loss and weakness.  Hematological:  Negative for bruising/bleeding tendency.  Psychiatric/Behavioral:  Negative for confusion.    PMFS History:  Patient Active Problem List   Diagnosis Date Noted   Primary localized osteoarthritis of right knee 10/19/2018   Right foot pain 10/19/2018   Incontinence of feces 09/21/2018   Vulvar lesion 07/20/2018   History of repair of right rotator cuff 03/03/2017   Primary osteoarthritis of both hands/ has had bilateral CMC surgery  03/03/2017   History of hypertension 03/03/2017   History of hyperlipidemia 03/03/2017   DDD (degenerative disc disease), lumbar/ history of lumbar surgery  03/03/2017   Osteopenia of multiple sites 03/03/2017   Chronic left shoulder pain 01/20/2017   Primary osteoarthritis of both knees 01/20/2017   Trapezius muscle spasm 01/20/2017   Insomnia 01/20/2017   Fatigue 01/20/2017   Fibromyalgia 01/20/2017   High risk medications (not anticoagulants) long-term use 01/20/2017   PMR (polymyalgia rheumatica) (HCC) 01/20/2017   Left ovarian cyst 06/02/2015   Degenerative joint disease, shoulder, right 08/02/2013   Precordial pain 02/11/2012   HTN (hypertension) 02/11/2012   Hyperlipidemia 02/11/2012    Past Medical History:  Diagnosis Date   Bilateral ovarian cysts 09/12/2014   bilateral simple ovarian cysts.  US due in January 2018.   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 x 20 yr.  Hyperlipidemia    Hypothyroid    IBS (irritable bowel syndrome)    Impingement syndrome of right shoulder 07/2013   Polymyalgia rheumatica (HCC)    5-28  states she has been on prednisone and now is trying to only be on plaquenil to manage the polymyalgia    PONV (postoperative nausea and vomiting)     Primary localized osteoarthritis of right knee    Rupture of right rotator cuff 07/2013   VIN I (vulvar intraepithelial neoplasia I) 2017, 2018   this is from a perineal/vulvar biopsy.   Vitamin B12 deficiency     Family History  Problem Relation Age of Onset   Aortic aneurysm Father    Hypertension Father    Coronary artery disease Mother 30   Hypertension Mother    Hyperlipidemia Brother        brain and kidney tumor, no treatment needed    Aneurysm Brother    Healthy Daughter    Healthy Daughter    Healthy Daughter    Past Surgical History:  Procedure Laterality Date   ANTERIOR LAT LUMBAR FUSION  10/08/2008   L3-4   BACK SURGERY     BACK SURGERY  10-03-14   --Dr. Trey Sailors at [facility]   COLONOSCOPY W/ BIOPSIES  11/2009   polyps   COLONOSCOPY W/ BIOPSIES  11/2012   polyp, recheck in 3 years   CYSTO  07/05/2006   CYSTOCELE REPAIR  07/05/2006   ESOPHAGOGASTRODUODENOSCOPY ENDOSCOPY  05/25/05   erythema and edema of gastric lumen   EYE SURGERY Right    muscle adjustment    FOOT SURGERY Right    GANGLION CYST EXCISION Right    wrist   INCONTINENCE SURGERY  07/05/2006   cystourethropexy   ORIF FOREARM FRACTURE Right 09/2012   SHOULDER ARTHROSCOPY WITH ROTATOR CUFF REPAIR AND SUBACROMIAL DECOMPRESSION Right 08/02/2013   Procedure: SHOULDER ARTHROSCOPY WITH ROTATOR CUFF REPAIR AND SUBACROMIAL DECOMPRESSION, EXTENSIVE DEBRIDEMENT (INCLUDING LABRUM), PARTIAL ACROMIPLASY WITH CORACOACROMIAL RELEASE, DISTAL CLAVICULECTOMY;  Surgeon: Loreta Ave, MD;  Location: [facility];  Service: Orthopedics;  Laterality: Right;   STRABISMUS SURGERY Right 01/26/2008   2 other surgeries previously   STRABISMUS SURGERY Right 06/03/2017   Procedure: REPAIR STRABISMUS RIGHT EYE;  Surgeon: Verne Carrow, MD;  Location: [facility];  Service: Ophthalmology;  Laterality: Right;   Thumb surgery  2/13 & ?2010   Bilateral - states a joint was replaced   TOTAL KNEE  ARTHROPLASTY Right 02/12/2019   Procedure: TOTAL KNEE ARTHROPLASTY;  Surgeon: Salvatore Marvel, MD;  Location: [facility];  Service: Orthopedics;  Laterality: Right;   TOTAL VAGINAL HYSTERECTOMY  65 - or age 6   secondary AUB   TUBAL LIGATION     laparosopic BTL age 54   varicose veins Bilateral    Social History   Social History Narrative   Lives at home with husband . One story home. Right handed   Immunization History  Administered Date(s) Administered   Influenza-Unspecified 08/04/2017   PFIZER(Purple Top)SARS-COV-2 Vaccination 11/15/2019, 12/12/2019, 06/16/2020   Pneumococcal Conjugate-13 05/14/2014, 05/15/2015   Tdap 09/13/2012     Objective: Vital Signs: BP 127/82 (BP Location: Left Arm, Patient Position: Sitting, Cuff Size: Normal)   Pulse 67   Ht 5\' 8"  (1.727 m)   Wt 184 lb (83.5 kg)   LMP 09/13/1984 (Approximate)   BMI 27.98 kg/m    Physical Exam Vitals and nursing note reviewed.  Constitutional:      Appearance: She is  well-developed.  HENT:     Head: Normocephalic and atraumatic.  Eyes:     Conjunctiva/sclera: Conjunctivae normal.  Cardiovascular:     Rate and Rhythm: Normal rate and regular rhythm.     Heart sounds: Normal heart sounds.  Pulmonary:     Effort: Pulmonary effort is normal.     Breath sounds: Normal breath sounds.  Abdominal:     General: Bowel sounds are normal.     Palpations: Abdomen is soft.  Musculoskeletal:     Cervical back: Normal range of motion.  Lymphadenopathy:     Cervical: No cervical adenopathy.  Skin:    General: Skin is warm and dry.     Capillary Refill: Capillary refill takes less than 2 seconds.  Neurological:     Mental Status: She is alert and oriented to person, place, and time.  Psychiatric:        Behavior: Behavior normal.     Musculoskeletal Exam: C-spine was in good range of motion.  Shoulder joints, elbow joints, wrist joints with good range of motion.  She had bilateral PIP and DIP thickening with no  synovitis.  Hip joints in good range of motion.  Right knee joint is replaced and was warm to touch.  Left knee joint was in good range of motion with no synovitis.  She had no tenderness over ankles or MTPs.  No muscular weakness or tenderness was noted.  CDAI Exam: CDAI Score: -- Patient Global: --; Provider Global: -- Swollen: --; Tender: -- Joint Exam 03/25/2021   No joint exam has been documented for this visit   There is currently no information documented on the homunculus. Go to the Rheumatology activity and complete the homunculus joint exam.  Investigation: No additional findings.  Imaging: No results found.  Recent Labs: Lab Results  Component Value Date   WBC 5.8 12/05/2019   HGB 11.4 (L) 12/05/2019   PLT 297 12/05/2019   NA 143 10/27/2020   K 4.6 10/27/2020   CL 107 10/27/2020   CO2 30 10/27/2020   GLUCOSE 85 10/27/2020   BUN 13 10/27/2020   CREATININE 0.64 10/27/2020   BILITOT 0.3 12/05/2019   ALKPHOS 73 02/08/2019   AST 18 12/05/2019   ALT 14 12/05/2019   PROT 6.9 12/05/2019   ALBUMIN 4.1 02/08/2019   CALCIUM 9.3 10/27/2020   GFRAA 103 10/27/2020    Speciality Comments: PLQ Eye Exam: 04/07/2020 WNL @ [facility] Follow up in 1 year  Procedures:  No procedures performed Allergies: Penicillins and Sulfa antibiotics   Assessment / Plan:     Visit Diagnoses: PMR (polymyalgia rheumatica) (HCC)-she had no muscular weakness or tenderness on my examination.  High risk medications (not anticoagulants) long-term use - PLQ d/c Feb 2021 due to not noticing any clinical benefit. She takes prednisone 5 mg daily as needed.  She does not want to come off prednisone.  Side effects of long-term use of prednisone were discussed.  She was advised not to take any NSAIDs while she is on prednisone.  She is planning to get the COVID-19 booster in the future.  Primary osteoarthritis of right shoulder-she had good range of motion without discomfort today.  History of  repair of right rotator cuff  Chronic left shoulder pain-she continues to have some discomfort in the left shoulder.  No warmth swelling or effusion was noted.  Primary osteoarthritis of both hands/ has had bilateral CMC surgery -joint protection muscle strengthening was discussed.  She had bilateral DIP  and PIP thickening and CMC arthritis.  S/P TKR (total knee replacement), right-she had warmth on palpation.  She had recent aspiration of her right knee joint and cortisone injection by an orthopedic surgeon.  Primary osteoarthritis of left knee-she has chronic discomfort.  DDD (degenerative disc disease), lumbar-she continues to have some lower back pain.  Trapezius muscle spasm -stretching exercises were discussed.  Fibromyalgia-she continues to have some generalized pain and discomfort.  Need for regular exercise was emphasized.  Other insomnia -improved on trazodone.  She states she is better rested and fatigue is improved.  Other fatigue-improved.  Osteopenia of multiple sites - DEXA updated on 08/27/2020. DEXA at [facility] on 07/30/18: BMD at Left FN 0.851 with T-score -1.3.  She will need repeat DEXA scan next year.  Increased risk of osteoporosis with the use of prednisone was also discussed.  History of hyperlipidemia  History of hypertension-her blood pressure was normal today.  Rash and other nonspecific skin eruption - She developed a rash on bilateral forearms several months ago, R arm> left arm.  Patient was evaluated by dermatologist and was told that she has a benign rash per patient.  Orders: No orders of the defined types were placed in this encounter.  No orders of the defined types were placed in this encounter.    Follow-Up Instructions: Return in about 6 months (around 09/25/2021) for PMR, OA.   Pollyann Savoy, MD  Note - This record has been created using Animal nutritionist.  Chart creation errors have been sought, but may not always   have been located. Such creation errors do not reflect on  the standard of medical care.

## 2021-03-25 ENCOUNTER — Encounter: Payer: Self-pay | Admitting: Rheumatology

## 2021-03-25 ENCOUNTER — Other Ambulatory Visit: Payer: Self-pay

## 2021-03-25 ENCOUNTER — Ambulatory Visit: Payer: Medicare Other | Admitting: Rheumatology

## 2021-03-25 VITALS — BP 127/82 | HR 67 | Ht 68.0 in | Wt 184.0 lb

## 2021-03-25 DIAGNOSIS — M353 Polymyalgia rheumatica: Secondary | ICD-10-CM | POA: Diagnosis not present

## 2021-03-25 DIAGNOSIS — Z79899 Other long term (current) drug therapy: Secondary | ICD-10-CM | POA: Diagnosis not present

## 2021-03-25 DIAGNOSIS — Z9889 Other specified postprocedural states: Secondary | ICD-10-CM

## 2021-03-25 DIAGNOSIS — M19011 Primary osteoarthritis, right shoulder: Secondary | ICD-10-CM | POA: Diagnosis not present

## 2021-03-25 DIAGNOSIS — Z8679 Personal history of other diseases of the circulatory system: Secondary | ICD-10-CM

## 2021-03-25 DIAGNOSIS — M797 Fibromyalgia: Secondary | ICD-10-CM

## 2021-03-25 DIAGNOSIS — Z8639 Personal history of other endocrine, nutritional and metabolic disease: Secondary | ICD-10-CM

## 2021-03-25 DIAGNOSIS — R21 Rash and other nonspecific skin eruption: Secondary | ICD-10-CM

## 2021-03-25 DIAGNOSIS — G4709 Other insomnia: Secondary | ICD-10-CM

## 2021-03-25 DIAGNOSIS — M51369 Other intervertebral disc degeneration, lumbar region without mention of lumbar back pain or lower extremity pain: Secondary | ICD-10-CM

## 2021-03-25 DIAGNOSIS — M5136 Other intervertebral disc degeneration, lumbar region: Secondary | ICD-10-CM

## 2021-03-25 DIAGNOSIS — Z96651 Presence of right artificial knee joint: Secondary | ICD-10-CM

## 2021-03-25 DIAGNOSIS — M19041 Primary osteoarthritis, right hand: Secondary | ICD-10-CM

## 2021-03-25 DIAGNOSIS — M19042 Primary osteoarthritis, left hand: Secondary | ICD-10-CM

## 2021-03-25 DIAGNOSIS — R5383 Other fatigue: Secondary | ICD-10-CM

## 2021-03-25 DIAGNOSIS — M62838 Other muscle spasm: Secondary | ICD-10-CM

## 2021-03-25 DIAGNOSIS — M8589 Other specified disorders of bone density and structure, multiple sites: Secondary | ICD-10-CM

## 2021-03-25 DIAGNOSIS — M1712 Unilateral primary osteoarthritis, left knee: Secondary | ICD-10-CM

## 2021-03-25 DIAGNOSIS — M25512 Pain in left shoulder: Secondary | ICD-10-CM

## 2021-03-25 DIAGNOSIS — G8929 Other chronic pain: Secondary | ICD-10-CM

## 2021-04-06 ENCOUNTER — Other Ambulatory Visit: Payer: Self-pay | Admitting: Orthopaedic Surgery

## 2021-04-06 DIAGNOSIS — M79671 Pain in right foot: Secondary | ICD-10-CM

## 2021-04-27 ENCOUNTER — Ambulatory Visit
Admission: RE | Admit: 2021-04-27 | Discharge: 2021-04-27 | Disposition: A | Discharge status: Home / Self Care | Payer: Medicare Other | Source: Ambulatory Visit | Attending: Orthopaedic Surgery | Admitting: Orthopaedic Surgery

## 2021-04-27 DIAGNOSIS — M79671 Pain in right foot: Secondary | ICD-10-CM

## 2021-06-26 ENCOUNTER — Other Ambulatory Visit: Payer: Self-pay | Admitting: Obstetrics and Gynecology

## 2021-06-26 DIAGNOSIS — Z1231 Encounter for screening mammogram for malignant neoplasm of breast: Secondary | ICD-10-CM

## 2021-06-26 NOTE — Progress Notes (Signed)
Office Visit Note  Patient: [name]             Date of Birth: 1945/10/22           MRN: [id number]             PCP: Leonard Downing, MD Referring: Leonard Downing, * Visit Date: 06/30/2021 Occupation: @GUAROCC @  Subjective:  Pain in multiple joints   History of Present Illness: Isabel RIEMENSCHNEIDER is a 75 y.o. female with history of polymyalgia rheumatica, fibromyalgia, osteoarthritis, and DDD.  She presents today with increased pain in multiple joints including the left shoulder, right knee replacement, both ankle joints, and her lower back.  She states that she fell 2 weeks ago and has been experiencing progressively worsening back pain since then.  She was not evaluated after the fall.  She states that she continues to have persistent left shoulder joint pain and would like a cortisone injection today.  She has ongoing pain in the right knee replacement and has been following up with Dr. Noemi Chapel.  She has had to have fluid drawn off the right knee several times most recently a couple of months ago.  She denies any other joint swelling.  She has chronic pain in both feet especially in her ankle joints.  She denies any Achilles tendinitis or plantar fasciitis.  She has had minimal discomfort in her hands recently.  She has been taking Aleve most days for pain relief.  She has generalized myalgias and muscle tenderness due to fibromyalgia. She states that she has developed scaly patches on both arms and states that her PCP voiced the concern for psoriasis.  She was evaluated by dermatologist but did not receive a confirmatory diagnosis of psoriasis.  She is concerned about the diagnosis of psoriatic arthritis.   Activities of Daily Living:  Patient reports morning stiffness for all day.  Patient Reports nocturnal pain.  Difficulty dressing/grooming: Reports Difficulty climbing stairs: Reports Difficulty getting out of chair: Reports Difficulty using hands for taps,  buttons, cutlery, and/or writing: Denies  Review of Systems  Constitutional:  Negative for fatigue.  HENT:  Negative for mouth sores, mouth dryness and nose dryness.   Eyes:  Positive for itching. Negative for pain and dryness.  Respiratory:  Negative for shortness of breath and difficulty breathing.   Cardiovascular:  Negative for chest pain and palpitations.  Gastrointestinal:  Negative for blood in stool, constipation and diarrhea.  Endocrine: Negative for increased urination.  Genitourinary:  Negative for difficulty urinating.  Musculoskeletal:  Positive for joint pain, joint pain, joint swelling, myalgias, morning stiffness, muscle tenderness and myalgias.  Skin:  Positive for rash. Negative for color change.  Allergic/Immunologic: Negative for susceptible to infections.  Neurological:  Positive for weakness. Negative for dizziness, numbness, headaches and memory loss.  Hematological:  Negative for bruising/bleeding tendency.  Psychiatric/Behavioral:  Negative for confusion.    PMFS History:  Patient Active Problem List   Diagnosis Date Noted   Primary localized osteoarthritis of right knee 10/19/2018   Right foot pain 10/19/2018   Incontinence of feces 09/21/2018   Vulvar lesion 07/20/2018   History of repair of right rotator cuff 03/03/2017   Primary osteoarthritis of both hands/ has had bilateral [place] surgery  03/03/2017   History of hypertension 03/03/2017   History of hyperlipidemia 03/03/2017   DDD (degenerative disc disease), lumbar/ history of lumbar surgery  03/03/2017   Osteopenia of multiple sites 03/03/2017   Chronic left shoulder pain 01/20/2017  Primary osteoarthritis of both knees 01/20/2017   Trapezius muscle spasm 01/20/2017   Insomnia 01/20/2017   Fatigue 01/20/2017   Fibromyalgia 01/20/2017   High risk medications (not anticoagulants) long-term use 01/20/2017   PMR (polymyalgia rheumatica) ([place]) 01/20/2017   Left ovarian cyst 06/02/2015   Degenerative  joint disease, shoulder, right 08/02/2013   Precordial pain 02/11/2012   HTN (hypertension) 02/11/2012   Hyperlipidemia 02/11/2012    Past Medical History:  Diagnosis Date   Bilateral ovarian cysts 09/12/2014   bilateral simple ovarian cysts.  [place] due in January 2018.   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 x 20 yr.   Hyperlipidemia    Hypothyroid    IBS (irritable bowel syndrome)    Impingement syndrome of right shoulder 07/2013   Polymyalgia rheumatica ([place])    5-28  states she has been on prednisone and now is trying to only be on plaquenil to manage the polymyalgia    PONV (postoperative nausea and vomiting)    Primary localized osteoarthritis of right knee    Rupture of right rotator cuff 07/2013   VIN I (vulvar intraepithelial neoplasia I) 2017, 2018   this is from a perineal/vulvar biopsy.   Vitamin B12 deficiency     Family History  Problem Relation Age of Onset   Aortic aneurysm Father    Hypertension Father    Coronary artery disease Mother 42   Hypertension Mother    Hyperlipidemia Brother        brain and kidney tumor, no treatment needed    Aneurysm Brother    Healthy Daughter    Healthy Daughter    Healthy Daughter    Past Surgical History:  Procedure Laterality Date   ANTERIOR LAT LUMBAR FUSION  10/08/2008   L3-4   BACK SURGERY     BACK SURGERY  10-03-14   --Dr. Glenna Fellows at [facility]  11/2009   polyps   COLONOSCOPY W/ BIOPSIES  11/2012   polyp, recheck in 3 years   CYSTO  07/05/2006   CYSTOCELE REPAIR  07/05/2006   ESOPHAGOGASTRODUODENOSCOPY ENDOSCOPY  05/25/05   erythema and edema of gastric lumen   EYE SURGERY Right    muscle adjustment    FOOT SURGERY Right    GANGLION CYST EXCISION Right    wrist   INCONTINENCE SURGERY  07/05/2006   cystourethropexy   ORIF FOREARM FRACTURE Right 09/2012   SHOULDER ARTHROSCOPY  WITH ROTATOR CUFF REPAIR AND SUBACROMIAL DECOMPRESSION Right 08/02/2013   Procedure: SHOULDER ARTHROSCOPY WITH ROTATOR CUFF REPAIR AND SUBACROMIAL DECOMPRESSION, EXTENSIVE DEBRIDEMENT (INCLUDING LABRUM), PARTIAL ACROMIPLASY WITH CORACOACROMIAL RELEASE, DISTAL CLAVICULECTOMY;  Surgeon: Ninetta Lights, MD;  Location: [facility];  Service: Orthopedics;  Laterality: Right;   STRABISMUS SURGERY Right 01/26/2008   2 other surgeries previously   STRABISMUS SURGERY Right 06/03/2017   Procedure: REPAIR STRABISMUS RIGHT EYE;  Surgeon: Everitt Amber, MD;  Location: [facility];  Service: Ophthalmology;  Laterality: Right;   Thumb surgery  2/13 & ?2010   Bilateral - states a joint was replaced   TOTAL KNEE ARTHROPLASTY Right 02/12/2019   Procedure: TOTAL KNEE ARTHROPLASTY;  Surgeon: Elsie Saas, MD;  Location: [facility];  Service: Orthopedics;  Laterality: Right;   TOTAL VAGINAL HYSTERECTOMY  1986 - or age 60   secondary AUB   TUBAL LIGATION  laparosopic BTL age 61   varicose veins Bilateral    Social History   Social History Narrative   Lives at home with husband . One story home. Right handed   Immunization History  Administered Date(s) Administered   Influenza-Unspecified 08/04/2017   PFIZER(Purple Top)SARS-COV-2 Vaccination 11/15/2019, 12/12/2019, 06/16/2020   Pneumococcal Conjugate-13 05/14/2014, 05/15/2015   Tdap 09/13/2012     Objective: Vital Signs: BP 129/81 (BP Location: Left Arm, Patient Position: Sitting, Cuff Size: Normal)   Pulse 83   Ht 5' 8"  (1.727 m)   Wt 177 lb 9.6 oz (80.6 kg)   LMP 09/13/1984 (Approximate)   BMI 27.00 kg/m    Physical Exam Vitals and nursing note reviewed.  Constitutional:      Appearance: She is well-developed.  HENT:     Head: Normocephalic and atraumatic.  Eyes:     Conjunctiva/sclera: Conjunctivae normal.  Pulmonary:     Effort: Pulmonary effort is normal.  Abdominal:     Palpations: Abdomen is soft.   Musculoskeletal:     Cervical back: Normal range of motion.  Skin:    General: Skin is warm and dry.     Capillary Refill: Capillary refill takes less than 2 seconds.  Neurological:     Mental Status: She is alert and oriented to person, place, and time.  Psychiatric:        Behavior: Behavior normal.     Musculoskeletal Exam: Generalized hyperalgesia and positive tender points on exam.  C-spine has limited range of motion with lateral rotation.  Painful range of motion of the left shoulder joint.  Elbow joints, wrist joints, MCPs, PIPs, DIPs have good range of motion with no synovitis.  CMC joint thickening and subluxation noted bilaterally.  PIP and DIP thickening consistent with osteoarthritis of both hands.  Complete fist formation bilaterally.  Hip joints have good range of motion with no groin pain.  Left knee joint has good range of motion with no warmth or effusion.  Right knee replacement has warmth and an effusion on examination today.  Ankle joints have good range of motion with tenderness bilaterally.  Warmth of the right ankle noted.  PIP and DIP thickening consistent with osteoarthritis of both feet.  No tenderness over MTP joints.  CDAI Exam: CDAI Score: -- Patient Global: --; Provider Global: -- Swollen: --; Tender: -- Joint Exam 06/30/2021   No joint exam has been documented for this visit   There is currently no information documented on the homunculus. Go to the Rheumatology activity and complete the homunculus joint exam.  Investigation: No additional findings.  Imaging: XR Lumbar Spine 2-3 Views  Result Date: 06/30/2021 Postsurgical changes from L2-S1 fusion was noted.  L1-L2 spondylolisthesis was noted.  When compared to the x-rays from 2018 increase in  L1-L2 spondylolisthesis was noted. Impression: These findings are consistent with multilevel spondylosis, L1-L2 spondylolisthesis and facet joint arthropathy.  XR Pelvis 1-2 Views  Result Date: 06/30/2021 No  SI joint sclerosis or narrowing was noted.  Early osteoarthritic changes were noted in the SI joints.  Hardware was noted in the lumbar spine.  No significant hip joint narrowing was noted. Impression: No acute findings were noted.  Hardware from prior surgery was noted in the lumbar spine.  XR Shoulder Left  Result Date: 06/30/2021 No glenohumeral or acromioclavicular joint space narrowing was noted.  No chondrocalcinosis was noted. Impression: Unremarkable x-ray of the shoulder joint.   Recent Labs: Lab Results  Component Value Date   WBC 5.8 12/05/2019  HGB 11.4 (L) 12/05/2019   PLT 297 12/05/2019   NA 143 10/27/2020   K 4.6 10/27/2020   CL 107 10/27/2020   CO2 30 10/27/2020   GLUCOSE 85 10/27/2020   BUN 13 10/27/2020   CREATININE 0.64 10/27/2020   BILITOT 0.3 12/05/2019   ALKPHOS 73 02/08/2019   AST 18 12/05/2019   ALT 14 12/05/2019   PROT 6.9 12/05/2019   ALBUMIN 4.1 02/08/2019   CALCIUM 9.3 10/27/2020   GFRAA 103 10/27/2020    Speciality Comments: PLQ Eye Exam: 04/07/2020 WNL @ [facility] Follow up in 1 year  Procedures:  Large Joint Inj: L glenohumeral on 06/30/2021 10:34 AM Indications: pain Details: 27 G 1.5 in needle, posterior approach  Arthrogram: No  Medications: 1 mL lidocaine 1 %; 40 mg triamcinolone acetonide 40 MG/ML Aspirate: 0 mL Outcome: tolerated well, no immediate complications Procedure, treatment alternatives, risks and benefits explained, specific risks discussed. Consent was given by the patient. Immediately prior to procedure a time out was called to verify the correct patient, procedure, equipment, support staff and site/side marked as required. Patient was prepped and draped in the usual sterile fashion.    Allergies: Penicillins and Sulfa antibiotics   Assessment / Plan:     Visit Diagnoses: PMR (polymyalgia rheumatica) ([place]) -She presents today with increased joint pain in multiple joints including the left shoulder, right knee  replacement, both ankles, and her lower back.  She was evaluated on 03/25/2021 and at that time was having ongoing discomfort in her left shoulder but did not have a cortisone injection at that time.  Her symptoms were not typical of a PMR flare.  She is not having any increased muscular weakness or muscle tenderness at this time.  She does not have any clinical features of inflammatory arthritis at this time.  No synovitis was noted on examination today.  She continues to take prednisone 5 mg daily as needed when she is having symptoms of a flare or increased joint pain.  She has been trying to take the prednisone very sparingly as recommended.  She requested a left shoulder joint cortisone injection today.  Updated x-rays of the left shoulder, pelvis, lumbar spine were obtained for further evaluation.  The following lab work was also updated for further evaluation.  We will check a sed rate today as well.  Her symptoms are not typical for PMR flare at this time.  She was advised to notify us if she develops any new or worsening symptoms.  She will follow-up in the office in 2 months to reassess how she is doing.  Plan: Sedimentation rate  High risk medications (not anticoagulants) long-term use - PLQ d/c Feb 2021 due to not noticing any clinical benefit. She takes prednisone 5 mg daily as needed.  CBC and CMP will be updated today.  She is aware of the risks of long-term prednisone use.- Plan: CBC with Differential/Platelet, COMPLETE METABOLIC PANEL WITH GFR  Rash and other nonspecific skin eruption - She developed a rash on bilateral forearms several months ago, R arm> left arm.  She has been evaluated by her PCP and dermatologist and according to the patient there is a concern for possible psoriasis.  She has a few erythematous patches with minimal scaling and some surrounding hyperpigmentation on examination today.  No signs of plaque psoriasis on the extensor surface of her elbows at this time.  No  fingernail pitting was noted.   She has not had a biopsy to prove  the diagnosis yet.  She is concerned about the possible diagnosis of psoriatic arthritis since she has been told she may have psoriasis.  She was strongly encouraged to follow back up with her dermatologist for further evaluation and to have all notes and results forwarded to our office to review.  Chronic bilateral low back pain without sciatica -She presents today with acute on chronic pain in her lower back.  She had a fall several weeks ago while playing with her grandchildren but was not evaluated initially after the fall.  She has not seen her neurosurgeon yet.  She had an MRI of the lumbar spine on 06/09/2019 which was reviewed today in the office.  Updated x-rays of the lumbar spine and pelvis were obtained today.  She was advised to schedule appointment with her neurosurgeon for further evaluation and management if her symptoms persist or worsen.  Plan: XR Lumbar Spine 2-3 Views, XR Pelvis 1-2 Views, HLA-B27 antigen  Chronic SI joint pain - She has chronic lower back pain.  She has tenderness over both SI joints especially the left SI as well as some midline spinal tenderness.  She has not been evaluated by her neurosurgeon recently.  X-rays of the pelvis were updated today which were consistent with osteoarthritic changes.  Previous pelvis CT results from 12/19/2019 reviewed today in the office.  Due to her family history of HLA-B27 positivity and ongoing chronic SI joint pain and HLA-B27 antigen will be checked today.  Plan: XR Pelvis 1-2 Views, HLA-B27 antigen  Polyarthralgia -She has been experiencing increased generalized arthralgias and myalgias on a daily basis.  According to the patient she was given a questionable diagnosis of psoriasis by her PCP but the diagnosis was not confirmed by her dermatologist.  She is concerned about the possible diagnosis of psoriatic arthritis.  She states she also has a family history of HLA-B27  positivity.  We will check the following lab work today.  Plan: Sedimentation rate, 14-3-3 eta Protein, Cyclic citrul peptide antibody, IgG, Rheumatoid factor, HLA-B27 antigen, Uric acid, C-reactive protein  Primary osteoarthritis of right shoulder: She is good range of motion of the right shoulder joint but experiences occasional discomfort and stiffness.  Overall her symptoms have been tolerable in the right shoulder.  History of repair of right rotator cuff: Doing well.  Chronic left shoulder pain - She presents today with acute on chronic pain in the left shoulder.  X-rays of the left shoulder were updated today which were unremarkable.  The left glenohumeral joint was injected with cortisone and the procedure note was completed above.  Aftercare was discussed.  She was advised to notify us if her discomfort persists or worsens.  Plan: XR Shoulder Left, Large Joint Inj: L glenohumeral  Primary osteoarthritis of both hands/ has had bilateral CMC surgery: She has PIP and DIP thickening consistent with osteoarthritis of both hands.  CMC joint prominence and thickening noted bilaterally.  No signs of synovitis were noted on examination today.  She was able to make a complete fist bilaterally.  The discomfort and stiffness in her hands has been tolerable overall.  Discussed importance of joint protection and muscle strengthening.  S/P TKR (total knee replacement), right: Dr. Feliz Beam presents today with significant discomfort in her right knee replacement.  She has warmth and an effusion on examination today.  In the past she has had the right knee replacement aspirated and injected with cortisone which provided temporary relief.  According to the patient she reached out  to Dr. Noemi Chapel but he was apprehensive to reaspirate the knee due to the increased risk for infection.  Primary osteoarthritis of left knee: Chronic pain.  She has good range of motion of the left knee joint with no warmth or  effusion.  DDD (degenerative disc disease), lumbar - She continues to have chronic lower back pain but has not seen her neurosurgeon recently.  She had a fall several weeks ago while playing with her grandchildren and was not evaluated at that time.  She has been ambulating without difficulty but continues to have persistent discomfort.  X-rays of the lumbar spine and pelvis were updated today and Dr. Estanislado Pandy did not notice any obvious acute abnormalities.  She was advised to schedule appointment with her neurosurgeon to further review the images.  A disc with the images was provided to the patient today.  Plan: XR Lumbar Spine 2-3 Views  Trapezius muscle spasm: She has trapezius muscle tension and tenderness bilaterally.  She experiences muscle spasms intermittently.  Fibromyalgia: She has generalized hyperalgesia and positive tender points on examination.  She has been having more frequent and severe fibromyalgia flares.  She has also been having worsening fatigue secondary to insomnia.  She has been taking trazodone 25 to 100 mg at bedtime to help her sleep at night.  Discussed the importance of regular exercise and good sleep hygiene.  Other fatigue: Chronic and secondary to insomnia.  Discussed the importance of regular exercise.   Other insomnia: She is been experiencing increased nocturnal pain which has been contributing to insomnia.  She takes trazodone 25 to 100 mg at bedtime as needed for insomnia.  Osteopenia of multiple sites - DEXA updated on 08/27/2020: Patient remains in the osteopenia range. DEXA at [facility] on 07/30/18: BMD at Left FN 0.851 with T-score -1.3.  She has been taking a calcium and vitamin D supplement daily. She had a recent fall while playing with her grandchildren and landed on her backside.  X-rays of the lumbar spine and pelvis were updated today.  She was given a disc with the images to take with her to her neurosurgeon for further evaluation.   Dr. Estanislado Pandy did not see any acute abnormalities.   History of hypertension: Blood pressure was 129/81 today in the office.  Advised to monitor blood pressure closely following the cortisone injection.  Other medical conditions are listed as follows:  History of hyperlipidemia    Orders: Orders Placed This Encounter  Procedures   Large Joint Inj: L glenohumeral   XR Shoulder Left   XR Lumbar Spine 2-3 Views   XR Pelvis 1-2 Views   Sedimentation rate   14-3-3 eta Protein   Cyclic citrul peptide antibody, IgG   Rheumatoid factor   HLA-B27 antigen   Uric acid   C-reactive protein   CBC with Differential/Platelet   COMPLETE METABOLIC PANEL WITH GFR   No orders of the defined types were placed in this encounter.     Follow-Up Instructions: Return in about 2 months (around 08/30/2021) for Polymyalgia Rheumatica, Fibromyalgia, Osteoarthritis, DDD.   Ofilia Neas, PA-C  Note - This record has been created using Dragon software.  Chart creation errors have been sought, but may not always  have been located. Such creation errors do not reflect on  the standard of medical care.

## 2021-06-30 ENCOUNTER — Ambulatory Visit: Payer: Self-pay

## 2021-06-30 ENCOUNTER — Other Ambulatory Visit: Payer: Self-pay

## 2021-06-30 ENCOUNTER — Encounter: Payer: Self-pay | Admitting: Physician Assistant

## 2021-06-30 ENCOUNTER — Ambulatory Visit: Payer: Medicare Other | Admitting: Physician Assistant

## 2021-06-30 VITALS — BP 129/81 | HR 83 | Ht 68.0 in | Wt 177.6 lb

## 2021-06-30 DIAGNOSIS — Z96651 Presence of right artificial knee joint: Secondary | ICD-10-CM

## 2021-06-30 DIAGNOSIS — M5136 Other intervertebral disc degeneration, lumbar region: Secondary | ICD-10-CM | POA: Diagnosis not present

## 2021-06-30 DIAGNOSIS — Z9889 Other specified postprocedural states: Secondary | ICD-10-CM | POA: Diagnosis not present

## 2021-06-30 DIAGNOSIS — R5383 Other fatigue: Secondary | ICD-10-CM

## 2021-06-30 DIAGNOSIS — M19011 Primary osteoarthritis, right shoulder: Secondary | ICD-10-CM

## 2021-06-30 DIAGNOSIS — M8589 Other specified disorders of bone density and structure, multiple sites: Secondary | ICD-10-CM

## 2021-06-30 DIAGNOSIS — M353 Polymyalgia rheumatica: Secondary | ICD-10-CM | POA: Diagnosis not present

## 2021-06-30 DIAGNOSIS — Z79899 Other long term (current) drug therapy: Secondary | ICD-10-CM | POA: Diagnosis not present

## 2021-06-30 DIAGNOSIS — M545 Low back pain, unspecified: Secondary | ICD-10-CM

## 2021-06-30 DIAGNOSIS — Z8639 Personal history of other endocrine, nutritional and metabolic disease: Secondary | ICD-10-CM

## 2021-06-30 DIAGNOSIS — M1712 Unilateral primary osteoarthritis, left knee: Secondary | ICD-10-CM

## 2021-06-30 DIAGNOSIS — M797 Fibromyalgia: Secondary | ICD-10-CM

## 2021-06-30 DIAGNOSIS — M533 Sacrococcygeal disorders, not elsewhere classified: Secondary | ICD-10-CM | POA: Diagnosis not present

## 2021-06-30 DIAGNOSIS — G8929 Other chronic pain: Secondary | ICD-10-CM | POA: Diagnosis not present

## 2021-06-30 DIAGNOSIS — M19042 Primary osteoarthritis, left hand: Secondary | ICD-10-CM

## 2021-06-30 DIAGNOSIS — M19041 Primary osteoarthritis, right hand: Secondary | ICD-10-CM

## 2021-06-30 DIAGNOSIS — M62838 Other muscle spasm: Secondary | ICD-10-CM

## 2021-06-30 DIAGNOSIS — M25512 Pain in left shoulder: Secondary | ICD-10-CM

## 2021-06-30 DIAGNOSIS — R21 Rash and other nonspecific skin eruption: Secondary | ICD-10-CM

## 2021-06-30 DIAGNOSIS — G4709 Other insomnia: Secondary | ICD-10-CM

## 2021-06-30 DIAGNOSIS — M2550 Pain in unspecified joint: Secondary | ICD-10-CM

## 2021-06-30 DIAGNOSIS — Z8679 Personal history of other diseases of the circulatory system: Secondary | ICD-10-CM

## 2021-06-30 MED ORDER — TRIAMCINOLONE ACETONIDE 40 MG/ML IJ SUSP
40.0000 mg | INTRAMUSCULAR | Status: AC | PRN
  Administered 2021-06-30: 40 mg via INTRA_ARTICULAR

## 2021-06-30 MED ORDER — LIDOCAINE HCL 1 % IJ SOLN
1.0000 mL | INTRAMUSCULAR | Status: AC | PRN
  Administered 2021-06-30: 1 mL

## 2021-07-01 NOTE — Progress Notes (Signed)
Anti-CCP negative. HLA-B27 negative.

## 2021-07-01 NOTE — Progress Notes (Signed)
CBC stable.  CMP WNL.  RF negative.  Uric acid WNL.  ESR and CRP WNL.

## 2021-07-08 LAB — CBC WITH DIFFERENTIAL/PLATELET
Absolute Monocytes: 507 cells/uL (ref 200–950)
Basophils Absolute: 30 cells/uL (ref 0–200)
Basophils Relative: 0.7 %
Eosinophils Absolute: 60 cells/uL (ref 15–500)
Eosinophils Relative: 1.4 %
HCT: 39.1 % (ref 35.0–45.0)
Hemoglobin: 12.1 g/dL (ref 11.7–15.5)
Lymphs Abs: 1995 cells/uL (ref 850–3900)
MCH: 25.9 pg — ABNORMAL LOW (ref 27.0–33.0)
MCHC: 30.9 g/dL — ABNORMAL LOW (ref 32.0–36.0)
MCV: 83.7 fL (ref 80.0–100.0)
MPV: 10.7 fL (ref 7.5–12.5)
Monocytes Relative: 11.8 %
Neutro Abs: 1707 cells/uL (ref 1500–7800)
Neutrophils Relative %: 39.7 %
Platelets: 255 10*3/uL (ref 140–400)
RBC: 4.67 10*6/uL (ref 3.80–5.10)
RDW: 15 % (ref 11.0–15.0)
Total Lymphocyte: 46.4 %
WBC: 4.3 10*3/uL (ref 3.8–10.8)

## 2021-07-08 LAB — COMPLETE METABOLIC PANEL WITH GFR
AG Ratio: 1.4 (calc) (ref 1.0–2.5)
ALT: 15 U/L (ref 6–29)
AST: 20 U/L (ref 10–35)
Albumin: 4.1 g/dL (ref 3.6–5.1)
Alkaline phosphatase (APISO): 105 U/L (ref 37–153)
BUN: 17 mg/dL (ref 7–25)
CO2: 27 mmol/L (ref 20–32)
Calcium: 9 mg/dL (ref 8.6–10.4)
Chloride: 106 mmol/L (ref 98–110)
Creat: 0.69 mg/dL (ref 0.60–1.00)
Globulin: 3 g/dL (calc) (ref 1.9–3.7)
Glucose, Bld: 75 mg/dL (ref 65–99)
Potassium: 4.1 mmol/L (ref 3.5–5.3)
Sodium: 141 mmol/L (ref 135–146)
Total Bilirubin: 0.2 mg/dL (ref 0.2–1.2)
Total Protein: 7.1 g/dL (ref 6.1–8.1)
eGFR: 92 mL/min/{1.73_m2} (ref >=60)

## 2021-07-08 LAB — SEDIMENTATION RATE: Sed Rate: 28 mm/h (ref 0–30)

## 2021-07-08 LAB — HLA-B27 ANTIGEN: HLA-B27 Antigen: NEGATIVE

## 2021-07-08 LAB — 14-3-3 ETA PROTEIN: 14-3-3 eta Protein: <0.2 ng/mL (ref <0.2)

## 2021-07-08 LAB — URIC ACID: Uric Acid, Serum: 5.9 mg/dL (ref 2.5–7.0)

## 2021-07-08 LAB — C-REACTIVE PROTEIN: CRP: 7.1 mg/L (ref <8.0)

## 2021-07-08 LAB — CYCLIC CITRUL PEPTIDE ANTIBODY, IGG: Cyclic Citrullin Peptide Ab: <16 UNITS

## 2021-07-08 LAB — RHEUMATOID FACTOR: Rheumatoid fact SerPl-aCnc: <14 IU/mL (ref <14)

## 2021-07-09 NOTE — Progress Notes (Signed)
14-3-3 eta is negative.

## 2021-07-30 ENCOUNTER — Ambulatory Visit
Admission: RE | Admit: 2021-07-30 | Discharge: 2021-07-30 | Disposition: A | Discharge status: Home / Self Care | Payer: Medicare Other | Source: Ambulatory Visit | Attending: Obstetrics and Gynecology | Admitting: Obstetrics and Gynecology

## 2021-07-30 DIAGNOSIS — Z1231 Encounter for screening mammogram for malignant neoplasm of breast: Secondary | ICD-10-CM

## 2021-09-09 NOTE — Progress Notes (Signed)
Office Visit Note  Patient: [name]             Date of Birth: April 30, 1946           MRN: [id number]             PCP: Leonard Downing, MD Referring: Leonard Downing, * Visit Date: 09/23/2021 Occupation: @GUAROCC @  Subjective:  Pain in multiple joints   History of Present Illness: [name] is a 75 y.o. female with history of polymyalgia rheumatica, osteoarthritis, and DDD.  She presents today with increased pain involving multiple joints.  Her pain has been most severe in the left shoulder, right knee replacement, and her lower back.  She had a left shoulder cortisone injection performed at her last office visit on 06/30/2021 which provided temporary relief.  She is requesting a repeat injection today since it improved her range of motion and discomfort last time.  In regards to her right knee replacement she has been going to physical therapy twice a week.  She is currently awaiting consultation with Dr. Wynelle Link for a second opinion due to the ongoing pain, swelling, and restriction of mobility.  She uses voltaren gel topically as needed for pain relief. She continues to have chronic pain in her lower back and is unsure if this is also contributing to her restricted mobility.  She is planning on seeing a neurologist.  She continues to experience intermittent myalgias and muscle tenderness due to fibromyalgia.  She is having some trapezius muscle tension and tenderness bilaterally.  She denies any signs or symptoms of a polymyalgia rheumatica flare recently.  She has been taking prednisone 5 mg as needed.    Activities of Daily Living:  Patient reports morning stiffness for all day. Patient Reports nocturnal pain.  Difficulty dressing/grooming: Reports Difficulty climbing stairs: Reports Difficulty getting out of chair: Reports Difficulty using hands for taps, buttons, cutlery, and/or writing: Denies  Review of Systems  Constitutional:  Negative for fatigue.   HENT:  Negative for mouth sores, mouth dryness and nose dryness.   Eyes:  Negative for pain, itching, visual disturbance and dryness.  Respiratory:  Negative for cough, hemoptysis, shortness of breath and difficulty breathing.   Cardiovascular:  Negative for chest pain, palpitations, hypertension and swelling in legs/feet.  Gastrointestinal:  Negative for blood in stool, constipation and diarrhea.  Endocrine: Negative for increased urination.  Genitourinary:  Negative for difficulty urinating and painful urination.  Musculoskeletal:  Positive for joint pain, joint pain, joint swelling, myalgias, muscle weakness, morning stiffness, muscle tenderness and myalgias.  Skin:  Negative for color change, pallor, rash, hair loss, nodules/bumps, redness, skin tightness, ulcers and sensitivity to sunlight.  Allergic/Immunologic: Negative for susceptible to infections.  Neurological:  Negative for dizziness, numbness, headaches, memory loss and weakness.  Hematological:  Positive for bruising/bleeding tendency. Negative for swollen glands.  Psychiatric/Behavioral:  Negative for depressed mood, confusion and sleep disturbance. The patient is not nervous/anxious.    PMFS History:  Patient Active Problem List   Diagnosis Date Noted   Primary localized osteoarthritis of right knee 10/19/2018   Right foot pain 10/19/2018   Incontinence of feces 09/21/2018   Vulvar lesion 07/20/2018   History of repair of right rotator cuff 03/03/2017   Primary osteoarthritis of both hands/ has had bilateral [place] surgery  03/03/2017   History of hypertension 03/03/2017   History of hyperlipidemia 03/03/2017   DDD (degenerative disc disease), lumbar/ history of lumbar surgery  03/03/2017   Osteopenia  of multiple sites 03/03/2017   Chronic left shoulder pain 01/20/2017   Primary osteoarthritis of both knees 01/20/2017   Trapezius muscle spasm 01/20/2017   Insomnia 01/20/2017   Fatigue 01/20/2017   Fibromyalgia  01/20/2017   High risk medications (not anticoagulants) long-term use 01/20/2017   PMR (polymyalgia rheumatica) ([place]) 01/20/2017   Left ovarian cyst 06/02/2015   Degenerative joint disease, shoulder, right 08/02/2013   Precordial pain 02/11/2012   HTN (hypertension) 02/11/2012   Hyperlipidemia 02/11/2012    Past Medical History:  Diagnosis Date   Bilateral ovarian cysts 09/12/2014   bilateral simple ovarian cysts.  [place] due in January 2018.   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 x 20 yr.   Hyperlipidemia    Hypothyroid    IBS (irritable bowel syndrome)    Impingement syndrome of right shoulder 07/2013   Polymyalgia rheumatica ([place])    5-28  states she has been on prednisone and now is trying to only be on plaquenil to manage the polymyalgia    PONV (postoperative nausea and vomiting)    Primary localized osteoarthritis of right knee    Rupture of right rotator cuff 07/2013   VIN I (vulvar intraepithelial neoplasia I) 2017, 2018   this is from a perineal/vulvar biopsy.   Vitamin B12 deficiency     Family History  Problem Relation Age of Onset   Aortic aneurysm Father    Hypertension Father    Coronary artery disease Mother 96   Hypertension Mother    Hyperlipidemia Brother        brain and kidney tumor, no treatment needed    Aneurysm Brother    Healthy Daughter    Healthy Daughter    Healthy Daughter    Past Surgical History:  Procedure Laterality Date   ANTERIOR LAT LUMBAR FUSION  10/08/2008   L3-4   BACK SURGERY     BACK SURGERY  10-03-14   --Dr. Glenna Fellows at [facility]  11/2009   polyps   COLONOSCOPY W/ BIOPSIES  11/2012   polyp, recheck in 3 years   CYSTO  07/05/2006   CYSTOCELE REPAIR  07/05/2006   ESOPHAGOGASTRODUODENOSCOPY ENDOSCOPY  05/25/05   erythema and edema of gastric lumen   EYE SURGERY Right    muscle adjustment     FOOT SURGERY Right    GANGLION CYST EXCISION Right    wrist   INCONTINENCE SURGERY  07/05/2006   cystourethropexy   ORIF FOREARM FRACTURE Right 09/2012   SHOULDER ARTHROSCOPY WITH ROTATOR CUFF REPAIR AND SUBACROMIAL DECOMPRESSION Right 08/02/2013   Procedure: SHOULDER ARTHROSCOPY WITH ROTATOR CUFF REPAIR AND SUBACROMIAL DECOMPRESSION, EXTENSIVE DEBRIDEMENT (INCLUDING LABRUM), PARTIAL ACROMIPLASY WITH CORACOACROMIAL RELEASE, DISTAL CLAVICULECTOMY;  Surgeon: Ninetta Lights, MD;  Location: [facility];  Service: Orthopedics;  Laterality: Right;   STRABISMUS SURGERY Right 01/26/2008   2 other surgeries previously   STRABISMUS SURGERY Right 06/03/2017   Procedure: REPAIR STRABISMUS RIGHT EYE;  Surgeon: Everitt Amber, MD;  Location: [facility];  Service: Ophthalmology;  Laterality: Right;   Thumb surgery  2/13 & ?2010   Bilateral - states a joint was replaced   TOTAL KNEE ARTHROPLASTY Right 02/12/2019   Procedure: TOTAL KNEE ARTHROPLASTY;  Surgeon: Elsie Saas, MD;  Location: [facility];  Service: Orthopedics;  Laterality: Right;   TOTAL VAGINAL HYSTERECTOMY  63 - or age  39   secondary AUB   TUBAL LIGATION     laparosopic BTL age 64   varicose veins Bilateral    Social History   Social History Narrative   Lives at home with husband . One story home. Right handed   Immunization History  Administered Date(s) Administered   Influenza-Unspecified 08/04/2017   PFIZER(Purple Top)SARS-COV-2 Vaccination 11/15/2019, 12/12/2019, 06/16/2020   Pneumococcal Conjugate-13 05/14/2014, 05/15/2015   Tdap 09/13/2012     Objective: Vital Signs: BP (!) 148/103 (BP Location: Left Arm, Patient Position: Sitting, Cuff Size: Normal)    Pulse 73    Ht 5\' 8"  (1.727 m)    Wt 179 lb (81.2 kg)    LMP 09/13/1984 (Approximate)    BMI 27.22 kg/m    Physical Exam Vitals and nursing note reviewed.  Constitutional:      Appearance: She is well-developed.  HENT:     Head: Normocephalic  and atraumatic.  Eyes:     Conjunctiva/sclera: Conjunctivae normal.  Pulmonary:     Effort: Pulmonary effort is normal.  Abdominal:     Palpations: Abdomen is soft.  Musculoskeletal:     Cervical back: Normal range of motion.  Skin:    General: Skin is warm and dry.     Capillary Refill: Capillary refill takes less than 2 seconds.  Neurological:     Mental Status: She is alert and oriented to person, place, and time.  Psychiatric:        Behavior: Behavior normal.     Musculoskeletal Exam: C-spine has limited ROM.  Thoracic kyphosis noted.  Midline spinal tenderness in the lumbar region. Tenderness over both SI joints.  Right shoulder has slightly limited abduction.  Left shoulder has full ROM with discomfort.  Elbow joints have good ROM with no tenderness.  PIP and DIP thickening consistent with OA of both hands.  Right knee replacement warm and swollen.  Limited extension of the right knee.  Left knee joint crepitus noted.  Warmth of the right ankle.    CDAI Exam: CDAI Score: -- Patient Global: --; Provider Global: -- Swollen: --; Tender: -- Joint Exam 09/23/2021   No joint exam has been documented for this visit   There is currently no information documented on the homunculus. Go to the Rheumatology activity and complete the homunculus joint exam.  Investigation: No additional findings.  Imaging: No results found.  Recent Labs: Lab Results  Component Value Date   WBC 4.3 06/30/2021   HGB 12.1 06/30/2021   PLT 255 06/30/2021   NA 141 06/30/2021   K 4.1 06/30/2021   CL 106 06/30/2021   CO2 27 06/30/2021   GLUCOSE 75 06/30/2021   BUN 17 06/30/2021   CREATININE 0.69 06/30/2021   BILITOT 0.2 06/30/2021   ALKPHOS 73 02/08/2019   AST 20 06/30/2021   ALT 15 06/30/2021   PROT 7.1 06/30/2021   ALBUMIN 4.1 02/08/2019   CALCIUM 9.0 06/30/2021   GFRAA 103 10/27/2020    Speciality Comments: PLQ Eye Exam: 04/07/2020 WNL @ [facility] Follow up in 1  year  Procedures:  No procedures performed Allergies: Penicillins and Sulfa antibiotics   Assessment / Plan:     Visit Diagnoses: PMR (polymyalgia rheumatica) ([place]): She has not had any signs or symptoms of a polymyalgia rheumatica flare.  She experiences intermittent myalgias due to underlying fibromyalgia.  She takes prednisone very sparingly if she experiences symptoms of a flare.  A prescription for prednisone 5 mg 1 tablet daily (30 tablets  dispensed) was sent to the pharmacy on 01/05/2021 and she still has several tablets remaining.  She requested a refill of prednisone today.  She was advised to notify us if she develops signs or symptoms of a flare. She will follow up in 5 months.   High risk medications (not anticoagulants) long-term use - PLQ d/c Feb 2021 due to not noticing any clinical benefit. She takes prednisone 5 mg daily as needed. BMP WNL on 08/20/21.   Primary osteoarthritis of right shoulder: She has limited ROM with some discomfort and stiffness.  She has a history of rotator cuff repair in the past.   History of repair of right rotator cuff: Slightly limited range of motion especially with abduction.  Some tenderness on examination.  Chronic left shoulder pain: She presents today with increased pain in the left shoulder.  X-rays of the left shoulder unremarkable on 06/30/2021.  She had a cortisone injection performed at that time which provided temporary relief.  She has been experiencing increased pain with range of motion and requested a repeat cortisone injection today.  We opted out of performing the injection due to the elevation in her blood pressure: 159/103.  She was advised to follow-up with her PCP today to see if any medication changes need to be made.  She has been taking benazepril as prescribed.   She will notify us once her BP is better controlled and she can then return for a cortisone injection.   Primary osteoarthritis of both hands/ has had bilateral [facility]  surgery: She has PIP and DIP thickening consistent with osteoarthritis of both hands.  No tenderness or inflammation was noted on examination today.  Discussed the importance of joint protection and muscle strengthening.   S/P TKR (total knee replacement), right - Dr. Lillia Carmel pain.  She has warmth and swelling on examination today. She has limited extension of the right knee replacement on examination today.  She has been experiencing restricted mobility and buckling at times.  She is currently going to physical therapy twice weekly. She continues to use voltaren gel topically as needed for pain relief. She is seeking a second opinion with Dr. Lequita Halt.    Primary osteoarthritis of left knee: She has good ROM of the left knee with no discomfort.  No warmth or effusion noted.  Left knee crepitus noted.  DDD (degenerative disc disease), lumbar: S/p surgery in the past. Chronic pain.  She is currently going to physical therapy.  She had dry needling yesterday but has not noticed any improvement in her symptoms. She plans on establishing care with a neurologist.   Chronic SI joint pain: She has chronic pain in both SI joints.  She is currently going to physical therapy about twice per week.   Trapezius muscle spasm: She has ongoing trapezius muscle tension and tenderness bilaterally.   Fibromyalgia: She has generalized hyperalgesia and positive tender points on examination.  She continues to experience intermittent myalgias and muscle tenderness due to fibromyalgia.  She has trapezius muscle tension and tenderness bilaterally. She is currently going to PT. She has ongoing fatigue secondary to insomnia. She has been taking trazodone 25 to 100 mg at bedtime for insomnia.  Discussed the importance of joint protection and muscle strengthening.   Other insomnia -She takes trazodone 25 to 100 mg at bedtime as needed for insomnia.   Other fatigue: Chronic and secondary to insomnia and fibromyalgia.    Osteopenia of multiple sites - DEXA updated on 08/27/2020: Patient remains in the  osteopenia range. [facility] at [facility] on 07/30/18: BMD at Left FN 0.851 with T-score -1.3.  She is taking a calcium and vitamin D supplement daily. Due to update DEXA in December 2023.  Other medical conditions are listed as follows:   History of hyperlipidemia  History of hypertension: BP was elevated today in the office so we opted out of performing a cortisone injection.  Discussed he importance of close blood pressure monitoring. She will notify us once her BP is better controlled and return for a cortisone injection at that time.   Orders: No orders of the defined types were placed in this encounter.  No orders of the defined types were placed in this encounter.    Follow-Up Instructions: Return in about 5 months (around 02/21/2022) for Polymyalgia Rheumatica, Osteoarthritis, DDD.   Ofilia Neas, PA-C  Note - This record has been created using Dragon software.  Chart creation errors have been sought, but may not always  have been located. Such creation errors do not reflect on  the standard of medical care.

## 2021-09-23 ENCOUNTER — Other Ambulatory Visit: Payer: Self-pay

## 2021-09-23 ENCOUNTER — Ambulatory Visit: Payer: Medicare Other | Admitting: Physician Assistant

## 2021-09-23 ENCOUNTER — Encounter: Payer: Self-pay | Admitting: Physician Assistant

## 2021-09-23 VITALS — BP 148/103 | HR 73 | Ht 68.0 in | Wt 179.0 lb

## 2021-09-23 DIAGNOSIS — M62838 Other muscle spasm: Secondary | ICD-10-CM

## 2021-09-23 DIAGNOSIS — M533 Sacrococcygeal disorders, not elsewhere classified: Secondary | ICD-10-CM

## 2021-09-23 DIAGNOSIS — Z79899 Other long term (current) drug therapy: Secondary | ICD-10-CM

## 2021-09-23 DIAGNOSIS — Z8679 Personal history of other diseases of the circulatory system: Secondary | ICD-10-CM

## 2021-09-23 DIAGNOSIS — M353 Polymyalgia rheumatica: Secondary | ICD-10-CM

## 2021-09-23 DIAGNOSIS — G4709 Other insomnia: Secondary | ICD-10-CM

## 2021-09-23 DIAGNOSIS — M19042 Primary osteoarthritis, left hand: Secondary | ICD-10-CM

## 2021-09-23 DIAGNOSIS — M8589 Other specified disorders of bone density and structure, multiple sites: Secondary | ICD-10-CM

## 2021-09-23 DIAGNOSIS — M19011 Primary osteoarthritis, right shoulder: Secondary | ICD-10-CM | POA: Diagnosis not present

## 2021-09-23 DIAGNOSIS — M25512 Pain in left shoulder: Secondary | ICD-10-CM

## 2021-09-23 DIAGNOSIS — R5383 Other fatigue: Secondary | ICD-10-CM

## 2021-09-23 DIAGNOSIS — M545 Low back pain, unspecified: Secondary | ICD-10-CM

## 2021-09-23 DIAGNOSIS — Z96651 Presence of right artificial knee joint: Secondary | ICD-10-CM

## 2021-09-23 DIAGNOSIS — M797 Fibromyalgia: Secondary | ICD-10-CM

## 2021-09-23 DIAGNOSIS — G8929 Other chronic pain: Secondary | ICD-10-CM

## 2021-09-23 DIAGNOSIS — Z8639 Personal history of other endocrine, nutritional and metabolic disease: Secondary | ICD-10-CM

## 2021-09-23 DIAGNOSIS — Z9889 Other specified postprocedural states: Secondary | ICD-10-CM | POA: Diagnosis not present

## 2021-09-23 DIAGNOSIS — M1712 Unilateral primary osteoarthritis, left knee: Secondary | ICD-10-CM

## 2021-09-23 DIAGNOSIS — M5136 Other intervertebral disc degeneration, lumbar region: Secondary | ICD-10-CM

## 2021-09-23 DIAGNOSIS — M19041 Primary osteoarthritis, right hand: Secondary | ICD-10-CM

## 2021-09-23 MED ORDER — PREDNISONE 5 MG PO TABS: ORAL_TABLET | ORAL | 0 refills | Status: DC

## 2021-10-29 ENCOUNTER — Other Ambulatory Visit (HOSPITAL_COMMUNITY): Payer: Self-pay | Admitting: Orthopedic Surgery

## 2021-10-29 ENCOUNTER — Other Ambulatory Visit: Payer: Self-pay | Admitting: Orthopedic Surgery

## 2021-10-29 DIAGNOSIS — Z96651 Presence of right artificial knee joint: Secondary | ICD-10-CM

## 2021-11-10 ENCOUNTER — Other Ambulatory Visit: Payer: Self-pay

## 2021-11-10 ENCOUNTER — Encounter (HOSPITAL_COMMUNITY)
Admission: RE | Admit: 2021-11-10 | Discharge: 2021-11-10 | Disposition: A | Discharge status: Home / Self Care | Payer: Medicare Other | Source: Ambulatory Visit | Attending: Orthopedic Surgery | Admitting: Orthopedic Surgery

## 2021-11-10 DIAGNOSIS — Z96651 Presence of right artificial knee joint: Secondary | ICD-10-CM | POA: Insufficient documentation

## 2021-11-10 MED ORDER — TECHNETIUM TC 99M MEDRONATE IV KIT
20.0000 | PACK | Freq: Once | INTRAVENOUS | Status: AC | PRN
  Administered 2021-11-10: 21.2 via INTRAVENOUS

## 2021-12-07 ENCOUNTER — Ambulatory Visit
Admission: RE | Admit: 2021-12-07 | Discharge: 2021-12-07 | Disposition: A | Discharge status: Home / Self Care | Payer: Medicare Other | Source: Ambulatory Visit | Attending: Family Medicine | Admitting: Family Medicine

## 2021-12-07 ENCOUNTER — Other Ambulatory Visit: Payer: Self-pay | Admitting: Family Medicine

## 2021-12-07 DIAGNOSIS — R0789 Other chest pain: Secondary | ICD-10-CM

## 2021-12-09 ENCOUNTER — Telehealth: Payer: Self-pay

## 2021-12-09 NOTE — Telephone Encounter (Signed)
NOTES SCANNED TO REFERRAL 

## 2021-12-20 ENCOUNTER — Other Ambulatory Visit: Payer: Self-pay | Admitting: Physician Assistant

## 2021-12-21 NOTE — Telephone Encounter (Signed)
Next Visit: 02/24/2022 ? ?Last Visit: 09/23/2021 ? ?Last Fill: 10/27/2020 ? ?DX: Primary osteoarthritis of left knee ? ?Okay to refill voltaren gel?  ?

## 2021-12-31 ENCOUNTER — Ambulatory Visit: Payer: Medicare Other | Admitting: Cardiology

## 2021-12-31 ENCOUNTER — Encounter: Payer: Self-pay | Admitting: Cardiology

## 2021-12-31 VITALS — BP 116/82 | HR 76 | Ht 68.0 in | Wt 179.2 lb

## 2021-12-31 DIAGNOSIS — R011 Cardiac murmur, unspecified: Secondary | ICD-10-CM

## 2021-12-31 DIAGNOSIS — R072 Precordial pain: Secondary | ICD-10-CM

## 2021-12-31 MED ORDER — METOPROLOL TARTRATE 100 MG PO TABS: ORAL_TABLET | ORAL | 0 refills | Status: DC

## 2021-12-31 NOTE — Progress Notes (Signed)
?  ?Cardiology Office Note ? ? ?Date:  12/31/2021  ? ?ID:  [name], DOB June 26, 1946, MRN [id number] ? ?PCP:  Kaleen Mask, MD  ?Cardiologist:   Rollene Rotunda, MD ?Referring:  Kaleen Mask, MD ? ?No chief complaint on file. ? ? ?  ?History of Present Illness: ?[name] is a 76 y.o. female who presents for evaluation of chest pain.  The patient says that she has had some elevated blood pressure since December.  She has been treated with amlodipine.  She says the chest discomfort happens when she is sitting.  It happens after she has been active.  It seems to occur at rest.  It is not during the physical activity.  It is mild tightness.  It feels like a binder.  However, she thinks is happening with increased frequency and happening now every day.  It is mild to moderate in intensity.  It can last most of the day.  It goes away spontaneously.  She cannot bring it on. ? ?She is also had decreased sleep.  She has been fatigued.  She has had some nausea in the morning.  She is actually going to have revision of the knee surgery upcoming and she needs preoperative clearance for this. ? ?I did previously see her in 2013 for evaluation of chest discomfort.  She had a negative perfusion study. ? ? ?Past Medical History:  ?Diagnosis Date  ? Bilateral ovarian cysts 09/12/2014  ? bilateral simple ovarian cysts.  [place] due in January 2018.  ? Degenerative arthritis of right shoulder region 07/2013  ? Fibromyalgia   ? HTN (hypertension)   ? under control with meds., has been on med. x 20 yr.  ? Hyperlipidemia   ? Hypothyroid   ? IBS (irritable bowel syndrome)   ? Impingement syndrome of right shoulder 07/2013  ? Polymyalgia rheumatica ([facility])   ? 5-28  states she has been on prednisone and now is trying to only be on plaquenil to manage the polymyalgia   ? PONV (postoperative nausea and vomiting)   ? Primary localized osteoarthritis of right knee   ? Rupture of right rotator cuff 07/2013  ? VIN I  (vulvar intraepithelial neoplasia I) 2017, 2018  ? this is from a perineal/vulvar biopsy.  ? Vitamin B12 deficiency   ? ? ?Past Surgical History:  ?Procedure Laterality Date  ? ANTERIOR LAT LUMBAR FUSION  10/08/2008  ? L3-4  ? BACK SURGERY    ? BACK SURGERY  10-03-14  ? --Dr. Trey Sailors at [facility]  ? COLONOSCOPY W/ BIOPSIES  11/2009  ? polyps  ? COLONOSCOPY W/ BIOPSIES  11/2012  ? polyp, recheck in 3 years  ? CYSTO  07/05/2006  ? CYSTOCELE REPAIR  07/05/2006  ? ESOPHAGOGASTRODUODENOSCOPY ENDOSCOPY  05/25/05  ? erythema and edema of gastric lumen  ? EYE SURGERY Right   ? muscle adjustment   ? FOOT SURGERY Right   ? GANGLION CYST EXCISION Right   ? wrist  ? INCONTINENCE SURGERY  07/05/2006  ? cystourethropexy  ? ORIF FOREARM FRACTURE Right 09/2012  ? SHOULDER ARTHROSCOPY WITH ROTATOR CUFF REPAIR AND SUBACROMIAL DECOMPRESSION Right 08/02/2013  ? Procedure: SHOULDER ARTHROSCOPY WITH ROTATOR CUFF REPAIR AND SUBACROMIAL DECOMPRESSION, EXTENSIVE DEBRIDEMENT (INCLUDING LABRUM), PARTIAL ACROMIPLASY WITH CORACOACROMIAL RELEASE, DISTAL CLAVICULECTOMY;  Surgeon: Loreta Ave, MD;  Location: [facility];  Service: Orthopedics;  Laterality: Right;  ? STRABISMUS SURGERY Right 01/26/2008  ? 2 other surgeries previously  ? STRABISMUS  SURGERY Right 06/03/2017  ? Procedure: REPAIR STRABISMUS RIGHT EYE;  Surgeon: Verne Carrow, MD;  Location: [facility];  Service: Ophthalmology;  Laterality: Right;  ? Thumb surgery  2/13 & ?2010  ? Bilateral - states a joint was replaced  ? TOTAL KNEE ARTHROPLASTY Right 02/12/2019  ? Procedure: TOTAL KNEE ARTHROPLASTY;  Surgeon: Salvatore Marvel, MD;  Location: [facility];  Service: Orthopedics;  Laterality: Right;  ? TOTAL VAGINAL HYSTERECTOMY  1986 - or age 29  ? secondary AUB  ? TUBAL LIGATION    ? laparosopic BTL age 84  ? varicose veins Bilateral   ? ? ? ?Current Outpatient Medications  ?Medication Sig Dispense Refill  ? Ascorbic Acid (VITAMIN C PO) Take by mouth daily.     ? benazepril (LOTENSIN) 40 MG tablet Take 40 mg by mouth daily.     ? Calcium Carb-Cholecalciferol (CALCIUM 600 + D PO) Take 1 tablet by mouth daily.    ? diclofenac Sodium (VOLTAREN) 1 % GEL APPLY 2 TO 4 GRAMS TO  AFFECTED JOINT TOPICALLY 4  TIMES DAILY AS NEEDED 400 g 2  ? levothyroxine (SYNTHROID) 125 MCG tablet Take 125 mcg by mouth daily.    ? METAMUCIL FIBER PO Take 6 tablets by mouth daily.    ? metoprolol tartrate (LOPRESSOR) 100 MG tablet Take one tablet two hours prior to the test. 1 tablet 0  ? Multiple Vitamin (MULTIVITAMIN) tablet Take 1 tablet by mouth daily.    ? traZODone (DESYREL) 50 MG tablet Take 25-100 mg by mouth at bedtime as needed.    ? vitamin B-12 (CYANOCOBALAMIN) 100 MCG tablet Take 100 mcg by mouth daily.    ? amLODipine (NORVASC) 5 MG tablet Take 2.5-5 mg by mouth at bedtime.    ? ?No current facility-administered medications for this visit.  ? ? ?Allergies:   Penicillins and Sulfa antibiotics  ? ? ?Social History:  The patient  reports that she has never smoked. She has never used smokeless tobacco. She reports that she does not drink alcohol and does not use drugs.  ? ?Family History:  The patient's family history includes Aneurysm in her brother; Aortic aneurysm in her father; Coronary artery disease (age of onset: 34) in her mother; Healthy in her daughter, daughter, and daughter; Hyperlipidemia in her brother; Hypertension in her father and mother.  ? ? ?ROS:  Please see the history of present illness.   Otherwise, review of systems are positive for none.   All other systems are reviewed and negative.  ? ? ?PHYSICAL EXAM: ?VS:  BP 116/82   Pulse 76   Ht 5\' 8"  (1.727 m)   Wt 179 lb 3.2 oz (81.3 kg)   LMP 09/13/1984 (Approximate)   SpO2 97%   BMI 27.25 kg/m?  , BMI Body mass index is 27.25 kg/m?. ?GENERAL:  Well appearing ?HEENT:  Pupils equal round and reactive, fundi not visualized, oral mucosa unremarkable ?NECK:  No jugular venous distention, waveform within normal limits,  carotid upstroke brisk and symmetric, no bruits, no thyromegaly ?LYMPHATICS:  No cervical, inguinal adenopathy ?LUNGS:  Clear to auscultation bilaterally ?BACK:  No CVA tenderness ?CHEST:  Unremarkable ?HEART:  PMI not displaced or sustained,S1 and S2 within normal limits, no S3, no S4, no clicks, no rubs, 6 apical systolic murmur heard radiating up the aortic outflow tract and also slightly into the axilla, no diastolic murmurs ?ABD:  Flat, positive bowel sounds normal in frequency in pitch, no bruits, no rebound, no guarding, no  midline pulsatile mass, no hepatomegaly, no splenomegaly ?EXT:  2 plus pulses throughout, no edema, no cyanosis no clubbing ?SKIN:  No rashes no nodules ?NEURO:  Cranial nerves II through XII grossly intact, motor grossly intact throughout ?PSYCH:  Cognitively intact, oriented to person place and time ? ? ? ?EKG:  EKG is ordered today. ?The ekg ordered today demonstrates sinus rhythm, rate 76, axis within normal limits, intervals within normal limits, no acute ST-T wave changes. ? ? ?Recent Labs: ?06/30/2021: ALT 15; BUN 17; Creat 0.69; Hemoglobin 12.1; Platelets 255; Potassium 4.1; Sodium 141  ? ? ?Lipid Panel ?   ?Component Value Date/Time  ? CHOL 170 02/16/2012 1238  ? TRIG 72.0 02/16/2012 1238  ? HDL 57.30 02/16/2012 1238  ? CHOLHDL 3 02/16/2012 1238  ? VLDL 14.4 02/16/2012 1238  ? LDLCALC 98 02/16/2012 1238  ? ?  ? ?Wt Readings from Last 3 Encounters:  ?12/31/21 179 lb 3.2 oz (81.3 kg)  ?09/23/21 179 lb (81.2 kg)  ?06/30/21 177 lb 9.6 oz (80.6 kg)  ?  ? ? ?Other studies Reviewed: ?Additional studies/ records that were reviewed today include: Labs. ?Review of the above records demonstrates:  Please see elsewhere in the note.   ? ? ?ASSESSMENT AND PLAN: ? ?PRECORDIAL CHEST PAIN: Chest pain has nonanginal as well as anginal features.  She would not be able to complete a POET (Plain Old Exercise Treadmill) because of her knee pain.  Therefore, she will have a coronary CTA. ? ?PREOP: This  will be based on the results of the above. ? ?HTN: Her blood pressure is controlled and she will continue the meds as listed. ? ?MURMUR: I suspect some mild aortic stenosis and I will check an echocardiogram

## 2021-12-31 NOTE — Patient Instructions (Signed)
Medication Instructions:  ?No changes ?*If you need a refill on your cardiac medications before your next appointment, please call your pharmacy* ? ? ?Lab Work: ?Your provider would like for you to have the following labs today: BMET and TSH ? ?If you have labs (blood work) drawn today and your tests are completely normal, you will receive your results only by: ?MyChart Message (if you have MyChart) OR ?A paper copy in the mail ?If you have any lab test that is abnormal or we need to change your treatment, we will call you to review the results. ? ? ?Testing/Procedures: ?Your physician has requested that you have an echocardiogram. Echocardiography is a painless test that uses sound waves to create images of your heart. It provides your doctor with information about the size and shape of your heart and how well your heart?s chambers and valves are working. You may receive an ultrasound enhancing agent through an IV if needed to better visualize your heart during the echo.This procedure takes approximately one hour. There are no restrictions for this procedure. This will take place at the [address], [address].  ? ? ?Follow-Up: ?At [facility], you and your health needs are our priority.  As part of our continuing mission to provide you with exceptional heart care, we have created designated Provider Care Teams.  These Care Teams include your primary Cardiologist (physician) and Advanced Practice Providers (APPs -  Physician Assistants and Nurse Practitioners) who all work together to provide you with the care you need, when you need it. ? ?We recommend signing up for the patient portal called "MyChart".  Sign up information is provided on this After Visit Summary.  MyChart is used to connect with patients for Virtual Visits (Telemedicine).  Patients are able to view lab/test results, encounter notes, upcoming appointments, etc.  Non-urgent messages can be sent to your provider as well.   ?To learn more  about what you can do with MyChart, go to [email or web address].   ? ?Your next appointment:   ?Pending results from test ? ?Other Instructions ? ? ?Your cardiac CT will be scheduled at one of the below locations:  ? ?[facility] ?[address] ?[place], [place] [address] ?[phone] ? ?OR ? ?[facility] ?[address] ?[address] ?[place], [place] [address] ?[phone] ? ?If scheduled at [facility], please arrive at the [facility] and Children's Entrance (Entrance C2) of [facility] 30 minutes prior to test start time. ?You can use the FREE valet parking offered at entrance C (encouraged to control the heart rate for the test)  ?Proceed to the [facility] Radiology Department (first floor) to check-in and test prep. ? ?All radiology patients and guests should use entrance C2 at [facility], accessed from [facility], even though the hospital's physical address listed is [address] ? ? ? ?If scheduled at [facility], please arrive 15 mins early for check-in and test prep. ? ?Please follow these instructions carefully (unless otherwise directed): ? ?On the Night Before the Test: ?Be sure to Drink plenty of water. ?Do not consume any caffeinated/decaffeinated beverages or chocolate 12 hours prior to your test. ?Do not take any antihistamines 12 hours prior to your test. ? ? ?On the Day of the Test: ?Drink plenty of water until 1 hour prior to the test. ?Do not eat any food 4 hours prior to the test. ?You may take your regular  medications prior to the test.  ?Take metoprolol (Lopressor) two hours prior to test.. ?FEMALES- please wear underwire-free bra if available, avoid dresses & tight clothing ?     ?After the Test: ?Drink plenty of water. ?After receiving IV contrast, you may experience a mild flushed feeling. This is normal. ?On occasion, you may experience a mild rash up to 24 hours after  the test. This is not dangerous. If this occurs, you can take Benadryl 25 mg and increase your fluid intake. ?If you experience trouble breathing, this can be serious. If it is severe call 911 IMMEDIATELY. If it is mild, please call our office. ?If you take any of these medications: Glipizide/Metformin, Avandament, Glucavance, please do not take 48 hours after completing test unless otherwise instructed. ? ?We will call to schedule your test 2-4 weeks out understanding that some insurance companies will need an authorization prior to the service being performed.  ? ?For non-scheduling related questions, please contact the cardiac imaging nurse navigator should you have any questions/concerns: ?Rockwell Alexandria, Cardiac Imaging Nurse Navigator ?Larey Brick, Cardiac Imaging Nurse Navigator ?[facility] ?Direct Office Dial: [phone]  ? ?For scheduling needs, including cancellations and rescheduling, please call [place], [phone]. ? ? ? ? ?

## 2022-01-01 LAB — BASIC METABOLIC PANEL
BUN/Creatinine Ratio: 20 (ref 12–28)
BUN: 13 mg/dL (ref 8–27)
CO2: 23 mmol/L (ref 20–29)
Calcium: 9.5 mg/dL (ref 8.7–10.3)
Chloride: 105 mmol/L (ref 96–106)
Creatinine, Ser: 0.66 mg/dL (ref 0.57–1.00)
Glucose: 95 mg/dL (ref 70–99)
Potassium: 4.1 mmol/L (ref 3.5–5.2)
Sodium: 143 mmol/L (ref 134–144)
eGFR: 92 mL/min/{1.73_m2} (ref >59)

## 2022-01-01 LAB — TSH: TSH: 1.65 u[IU]/mL (ref 0.450–4.500)

## 2022-01-12 ENCOUNTER — Telehealth (HOSPITAL_COMMUNITY): Payer: Self-pay | Admitting: *Deleted

## 2022-01-12 NOTE — Telephone Encounter (Signed)
Reaching out to patient to offer assistance regarding upcoming cardiac imaging study; pt verbalizes understanding of appt date/time, parking situation and where to check in, pre-test NPO status and medications ordered, and verified current allergies; name and call back number provided for further questions should they arise ? ?Larey Brick RN Navigator Cardiac Imaging ?[facility] ?[phone] office ?[phone] cell ? ?Patient to take 100mg  metoprolol tartrate two hours prior to her cardiac CT scan and holding her daily BP medications. She is aware to arrive at 1:30pm. ?

## 2022-01-13 ENCOUNTER — Ambulatory Visit (HOSPITAL_COMMUNITY)
Admission: RE | Admit: 2022-01-13 | Discharge: 2022-01-13 | Disposition: A | Discharge status: Home / Self Care | Payer: Medicare Other | Source: Ambulatory Visit | Attending: Cardiology | Admitting: Cardiology

## 2022-01-13 DIAGNOSIS — R072 Precordial pain: Secondary | ICD-10-CM | POA: Insufficient documentation

## 2022-01-13 MED ORDER — NITROGLYCERIN 0.4 MG SL SUBL
SUBLINGUAL_TABLET | SUBLINGUAL | Status: AC
  Filled 2022-01-13: qty 2

## 2022-01-13 MED ORDER — IOHEXOL 350 MG/ML SOLN
100.0000 mL | Freq: Once | INTRAVENOUS | Status: AC | PRN
  Administered 2022-01-13: 100 mL via INTRAVENOUS

## 2022-01-13 MED ORDER — NITROGLYCERIN 0.4 MG SL SUBL
0.8000 mg | SUBLINGUAL_TABLET | Freq: Once | SUBLINGUAL | Status: AC
  Administered 2022-01-13: 0.8 mg via SUBLINGUAL

## 2022-01-18 ENCOUNTER — Ambulatory Visit (HOSPITAL_COMMUNITY): Payer: Medicare Other | Attending: Cardiology

## 2022-01-18 DIAGNOSIS — R011 Cardiac murmur, unspecified: Secondary | ICD-10-CM | POA: Insufficient documentation

## 2022-01-18 LAB — ECHOCARDIOGRAM COMPLETE
AR max vel: 1.57 cm2
AV Area VTI: 1.54 cm2
AV Area mean vel: 1.44 cm2
AV Mean grad: 13 mmHg
AV Peak grad: 19 mmHg
Ao pk vel: 2.18 m/s
Area-P 1/2: 2.08 cm2
P 1/2 time: 564 msec
S' Lateral: 3 cm

## 2022-02-11 NOTE — Progress Notes (Deleted)
Office Visit Note  Patient: [name]             Date of Birth: 04-Mar-1946           MRN: [id number]             PCP: Leonard Downing, MD Referring: Leonard Downing, * Visit Date: 02/24/2022 Occupation: @GUAROCC @  Subjective:  No chief complaint on file.   History of Present Illness: [name] is a 76 y.o. female ***   Activities of Daily Living:  Patient reports morning stiffness for *** {minute/hour:19697}.   Patient {ACTIONS;DENIES/REPORTS:21021675::"Denies"} nocturnal pain.  Difficulty dressing/grooming: {ACTIONS;DENIES/REPORTS:21021675::"Denies"} Difficulty climbing stairs: {ACTIONS;DENIES/REPORTS:21021675::"Denies"} Difficulty getting out of chair: {ACTIONS;DENIES/REPORTS:21021675::"Denies"} Difficulty using hands for taps, buttons, cutlery, and/or writing: {ACTIONS;DENIES/REPORTS:21021675::"Denies"}  No Rheumatology ROS completed.   PMFS History:  Patient Active Problem List   Diagnosis Date Noted   Precordial chest pain 12/31/2021   Primary localized osteoarthritis of right knee 10/19/2018   Right foot pain 10/19/2018   Incontinence of feces 09/21/2018   Vulvar lesion 07/20/2018   History of repair of right rotator cuff 03/03/2017   Primary osteoarthritis of both hands/ has had bilateral [place] surgery  03/03/2017   History of hypertension 03/03/2017   History of hyperlipidemia 03/03/2017   DDD (degenerative disc disease), lumbar/ history of lumbar surgery  03/03/2017   Osteopenia of multiple sites 03/03/2017   Chronic left shoulder pain 01/20/2017   Primary osteoarthritis of both knees 01/20/2017   Trapezius muscle spasm 01/20/2017   Insomnia 01/20/2017   Fatigue 01/20/2017   Fibromyalgia 01/20/2017   High risk medications (not anticoagulants) long-term use 01/20/2017   PMR (polymyalgia rheumatica) ([place]) 01/20/2017   Left ovarian cyst 06/02/2015   Degenerative joint disease, shoulder, right 08/02/2013   Precordial pain  02/11/2012   HTN (hypertension) 02/11/2012   Hyperlipidemia 02/11/2012    Past Medical History:  Diagnosis Date   Bilateral ovarian cysts 09/12/2014   bilateral simple ovarian cysts.  [place] due in January 2018.   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 x 20 yr.   Hyperlipidemia    Hypothyroid    IBS (irritable bowel syndrome)    Impingement syndrome of right shoulder 07/2013   Polymyalgia rheumatica ([place])    5-28  states she has been on prednisone and now is trying to only be on plaquenil to manage the polymyalgia    PONV (postoperative nausea and vomiting)    Primary localized osteoarthritis of right knee    Rupture of right rotator cuff 07/2013   VIN I (vulvar intraepithelial neoplasia I) 2017, 2018   this is from a perineal/vulvar biopsy.   Vitamin B12 deficiency     Family History  Problem Relation Age of Onset   Aortic aneurysm Father    Hypertension Father    Coronary artery disease Mother 108   Hypertension Mother    Hyperlipidemia Brother        brain and kidney tumor, no treatment needed    Aneurysm Brother    Healthy Daughter    Healthy Daughter    Healthy Daughter    Past Surgical History:  Procedure Laterality Date   ANTERIOR LAT LUMBAR FUSION  10/08/2008   L3-4   BACK SURGERY     BACK SURGERY  10-03-14   --Dr. Glenna Fellows at [place]  11/2009   polyps  COLONOSCOPY W/ BIOPSIES  11/2012   polyp, recheck in 3 years   CYSTO  07/05/2006   CYSTOCELE REPAIR  07/05/2006   ESOPHAGOGASTRODUODENOSCOPY ENDOSCOPY  05/25/05   erythema and edema of gastric lumen   EYE SURGERY Right    muscle adjustment    FOOT SURGERY Right    GANGLION CYST EXCISION Right    wrist   INCONTINENCE SURGERY  07/05/2006   cystourethropexy   ORIF FOREARM FRACTURE Right 09/2012   SHOULDER ARTHROSCOPY WITH ROTATOR CUFF REPAIR AND SUBACROMIAL DECOMPRESSION Right  08/02/2013   Procedure: SHOULDER ARTHROSCOPY WITH ROTATOR CUFF REPAIR AND SUBACROMIAL DECOMPRESSION, EXTENSIVE DEBRIDEMENT (INCLUDING LABRUM), PARTIAL ACROMIPLASY WITH CORACOACROMIAL RELEASE, DISTAL CLAVICULECTOMY;  Surgeon: Ninetta Lights, MD;  Location: [place];  Service: Orthopedics;  Laterality: Right;   STRABISMUS SURGERY Right 01/26/2008   2 other surgeries previously   STRABISMUS SURGERY Right 06/03/2017   Procedure: REPAIR STRABISMUS RIGHT EYE;  Surgeon: Everitt Amber, MD;  Location: [facility];  Service: Ophthalmology;  Laterality: Right;   Thumb surgery  2/13 & ?2010   Bilateral - states a joint was replaced   TOTAL KNEE ARTHROPLASTY Right 02/12/2019   Procedure: TOTAL KNEE ARTHROPLASTY;  Surgeon: Elsie Saas, MD;  Location: [facility];  Service: Orthopedics;  Laterality: Right;   TOTAL VAGINAL HYSTERECTOMY  19 - or age 71   secondary AUB   TUBAL LIGATION     laparosopic BTL age 57   varicose veins Bilateral    Social History   Social History Narrative   Lives at home with husband . One story home. Right handed   Immunization History  Administered Date(s) Administered   Influenza-Unspecified 08/04/2017   PFIZER(Purple Top)SARS-COV-2 Vaccination 11/15/2019, 12/12/2019, 06/16/2020   Pneumococcal Conjugate-13 05/14/2014, 05/15/2015   Tdap 09/13/2012     Objective: Vital Signs: LMP 09/13/1984 (Approximate)    Physical Exam   Musculoskeletal Exam: ***  CDAI Exam: CDAI Score: -- Patient Global: --; Provider Global: -- Swollen: --; Tender: -- Joint Exam 02/24/2022   No joint exam has been documented for this visit   There is currently no information documented on the homunculus. Go to the Rheumatology activity and complete the homunculus joint exam.  Investigation: No additional findings.  Imaging: CT CORONARY MORPH W/CTA COR W/SCORE W/CA W/CM &/OR WO/CM  Addendum Date: 01/13/2022   ADDENDUM REPORT: 01/13/2022 16:58 CLINICAL DATA:   Chest pain EXAM: Cardiac CTA MEDICATIONS: Sub lingual nitro. 4mg  x 2 TECHNIQUE: The patient was scanned on a Siemens [id number] slice scanner. Gantry rotation speed was 250 msecs. Collimation was 0.6 mm. A 100 kV prospective scan was triggered in the ascending thoracic aorta at 35-75% of the R-R interval. Average HR during the scan was 60 bpm. The 3D data set was interpreted on a dedicated work station using MPR, MIP and VRT modes. A total of 80cc of contrast was used. FINDINGS: Non-cardiac: See separate report from [facility]. The pulmonary veins drain normally to the left atrium. No LA appendage thrombus. Calcium Score: 0 Agatston units. Coronary Arteries: Right dominant with no anomalies LM: No plaque or stenosis. LAD system:  No plaque or stenosis Circumflex system: Small ramus is present. No plaque or stenosis in the LCx system. RCA system: Relatively small caliber RCA, no plaque or stenosis. IMPRESSION: 1. Coronary artery calcium score 0 Agatston units. This suggests low risk for future cardiac events. 2.  No significant coronary disease noted. Dalton [employer] Electronically Signed   By: Katherene Ponto.D.  On: 01/13/2022 16:58   Result Date: 01/13/2022 EXAM: OVER-READ INTERPRETATION  CT CHEST The following report is an over-read performed by radiologist Dr. Marijo Conception of [facility], [place] on 01/13/2022. This over-read does not include interpretation of cardiac or coronary anatomy or pathology. The coronary CTA interpretation by the cardiologist is attached. COMPARISON:  None Available. FINDINGS: Vascular: No definite abnormality is seen involving the visualized portions of the extracardiac vascular structures. Mediastinum/Nodes: No definite abnormality seen involving the visualized portion of the mediastinum. Lungs/Pleura: No significant abnormality seen involving the visualized portion of the pulmonary parenchyma. Upper Abdomen: Visualized portion of upper abdomen is unremarkable.  Musculoskeletal: No chest wall mass or suspicious bone lesions identified. IMPRESSION: No definite abnormality seen involving the extracardiac structures of the visualized chest. Electronically Signed: By: Marijo Conception M.D. On: 01/13/2022 16:01   ECHOCARDIOGRAM COMPLETE  Result Date: 01/18/2022    ECHOCARDIOGRAM REPORT   Patient Name:   [name] Date of Exam: 01/18/2022 Medical Rec #:  [id number]          Height:       68.0 in Accession #:    [id number]         Weight:       179.2 lb Date of Birth:  March 07, 1946          BSA:          1.951 m Patient Age:    41 years           BP:           148/86 mmHg Patient Gender: F                  HR:           64 bpm. Exam Location:  [address] Procedure: 2D Echo, Cardiac Doppler, Color Doppler and Strain Analysis Indications:    R01.1 Murmur  History:        Patient has no prior history of Echocardiogram examinations.                 Risk Factors:Hypertension.  Sonographer:    Marygrace Drought RCS Referring Phys: [place]  1. Left ventricular ejection fraction, by estimation, is 55 to 60%. The left ventricle has normal function. The left ventricle has no regional wall motion abnormalities. Left ventricular diastolic parameters are consistent with Grade I diastolic dysfunction (impaired relaxation).  2. Right ventricular systolic function is normal. The right ventricular size is normal. There is normal pulmonary artery systolic pressure. The estimated right ventricular systolic pressure is Q000111Q mmHg.  3. The mitral valve is normal in structure. Trivial mitral valve regurgitation. No evidence of mitral stenosis.  4. The aortic valve is tricuspid. There is moderate calcification of the aortic valve. Aortic valve regurgitation is trivial. Mild aortic valve stenosis. Aortic valve area, by VTI measures 1.54 cm. Aortic valve mean gradient measures 13.0 mmHg.  5. The inferior vena cava is normal in size with greater than 50% respiratory  variability, suggesting right atrial pressure of 3 mmHg. FINDINGS  Left Ventricle: Left ventricular ejection fraction, by estimation, is 55 to 60%. The left ventricle has normal function. The left ventricle has no regional wall motion abnormalities. The left ventricular internal cavity size was normal in size. There is  no left ventricular hypertrophy. Left ventricular diastolic parameters are consistent with Grade I diastolic dysfunction (impaired relaxation). Right Ventricle: The right ventricular size is normal. No increase in right ventricular wall thickness.  Right ventricular systolic function is normal. There is normal pulmonary artery systolic pressure. The tricuspid regurgitant velocity is 2.33 m/s, and  with an assumed right atrial pressure of 3 mmHg, the estimated right ventricular systolic pressure is Q000111Q mmHg. Left Atrium: Left atrial size was normal in size. Right Atrium: Right atrial size was normal in size. Pericardium: There is no evidence of pericardial effusion. Mitral Valve: The mitral valve is normal in structure. Mild to moderate mitral annular calcification. Trivial mitral valve regurgitation. No evidence of mitral valve stenosis. Tricuspid Valve: The tricuspid valve is normal in structure. Tricuspid valve regurgitation is mild. Aortic Valve: The aortic valve is tricuspid. There is moderate calcification of the aortic valve. Aortic valve regurgitation is trivial. Aortic regurgitation PHT measures 564 msec. Mild aortic stenosis is present. Aortic valve mean gradient measures 13.0  mmHg. Aortic valve peak gradient measures 19.0 mmHg. Aortic valve area, by VTI measures 1.54 cm. Pulmonic Valve: The pulmonic valve was normal in structure. Pulmonic valve regurgitation is not visualized. Aorta: The aortic root is normal in size and structure. Venous: The inferior vena cava is normal in size with greater than 50% respiratory variability, suggesting right atrial pressure of 3 mmHg. IAS/Shunts: No  atrial level shunt detected by color flow Doppler.  LEFT VENTRICLE PLAX 2D LVIDd:         4.30 cm   Diastology LVIDs:         3.00 cm   LV e' medial:    7.83 cm/s LV PW:         0.80 cm   LV E/e' medial:  9.5 LV IVS:        1.10 cm   LV e' lateral:   14.30 cm/s LVOT diam:     2.00 cm   LV E/e' lateral: 5.2 LV SV:         78 LV SV Index:   40 LVOT Area:     3.14 cm  RIGHT VENTRICLE RV Basal diam:  4.20 cm RV Mid diam:    3.00 cm RV S prime:     10.20 cm/s TAPSE (M-mode): 2.1 cm LEFT ATRIUM             Index        RIGHT ATRIUM           Index LA diam:        3.70 cm 1.90 cm/m   RA Area:     15.00 cm LA Vol (A2C):   42.6 ml 21.84 ml/m  RA Volume:   38.70 ml  19.84 ml/m LA Vol (A4C):   38.6 ml 19.76 ml/m LA Biplane Vol: 47.3 ml 24.25 ml/m  AORTIC VALVE AV Area (Vmax):    1.57 cm AV Area (Vmean):   1.44 cm AV Area (VTI):     1.54 cm AV Vmax:           217.75 cm/s AV Vmean:          157.000 cm/s AV VTI:            0.503 m AV Peak Grad:      19.0 mmHg AV Mean Grad:      13.0 mmHg LVOT Vmax:         109.00 cm/s LVOT Vmean:        71.900 cm/s LVOT VTI:          0.247 m LVOT/AV VTI ratio: 0.49 AI PHT:  564 msec  AORTA Ao Root diam: 3.20 cm Ao Asc diam:  3.50 cm MITRAL VALVE               TRICUSPID VALVE MV Area (PHT):             TR Peak grad:   21.7 mmHg MV Decel Time:             TR Vmax:        233.00 cm/s MV E velocity: 74.40 cm/s MV A velocity: 93.30 cm/s  SHUNTS MV E/A ratio:  0.80        Systemic VTI:  0.25 m                            Systemic Diam: 2.00 cm Dalton McleanMD Electronically signed by Franki Monte Signature Date/Time: 01/18/2022/2:14:36 PM    Final     Recent Labs: Lab Results  Component Value Date   WBC 4.3 06/30/2021   HGB 12.1 06/30/2021   PLT 255 06/30/2021   NA 143 12/31/2021   K 4.1 12/31/2021   CL 105 12/31/2021   CO2 23 12/31/2021   GLUCOSE 95 12/31/2021   BUN 13 12/31/2021   CREATININE 0.66 12/31/2021   BILITOT 0.2 06/30/2021   ALKPHOS 73 02/08/2019   AST 20  06/30/2021   ALT 15 06/30/2021   PROT 7.1 06/30/2021   ALBUMIN 4.1 02/08/2019   CALCIUM 9.5 12/31/2021   GFRAA 103 10/27/2020    Speciality Comments: PLQ Eye Exam: 04/07/2020 WNL @ [facility] Follow up in 1 year  Procedures:  No procedures performed Allergies: Penicillins and Sulfa antibiotics   Assessment / Plan:     Visit Diagnoses: No diagnosis found.  Orders: No orders of the defined types were placed in this encounter.  No orders of the defined types were placed in this encounter.   Face-to-face time spent with patient was *** minutes. Greater than 50% of time was spent in counseling and coordination of care.  Follow-Up Instructions: No follow-ups on file.   Earnestine Mealing, CMA  Note - This record has been created using Editor, commissioning.  Chart creation errors have been sought, but may not always  have been located. Such creation errors do not reflect on  the standard of medical care.

## 2022-02-24 ENCOUNTER — Ambulatory Visit: Payer: Medicare Other | Admitting: Physician Assistant

## 2022-02-24 DIAGNOSIS — Z8679 Personal history of other diseases of the circulatory system: Secondary | ICD-10-CM

## 2022-02-24 DIAGNOSIS — M1712 Unilateral primary osteoarthritis, left knee: Secondary | ICD-10-CM

## 2022-02-24 DIAGNOSIS — R5383 Other fatigue: Secondary | ICD-10-CM

## 2022-02-24 DIAGNOSIS — G4709 Other insomnia: Secondary | ICD-10-CM

## 2022-02-24 DIAGNOSIS — M5136 Other intervertebral disc degeneration, lumbar region: Secondary | ICD-10-CM

## 2022-02-24 DIAGNOSIS — M8589 Other specified disorders of bone density and structure, multiple sites: Secondary | ICD-10-CM

## 2022-02-24 DIAGNOSIS — Z8639 Personal history of other endocrine, nutritional and metabolic disease: Secondary | ICD-10-CM

## 2022-02-24 DIAGNOSIS — Z79899 Other long term (current) drug therapy: Secondary | ICD-10-CM

## 2022-02-24 DIAGNOSIS — M62838 Other muscle spasm: Secondary | ICD-10-CM

## 2022-02-24 DIAGNOSIS — M19041 Primary osteoarthritis, right hand: Secondary | ICD-10-CM

## 2022-02-24 DIAGNOSIS — M353 Polymyalgia rheumatica: Secondary | ICD-10-CM

## 2022-02-24 DIAGNOSIS — Z96651 Presence of right artificial knee joint: Secondary | ICD-10-CM

## 2022-02-24 DIAGNOSIS — M797 Fibromyalgia: Secondary | ICD-10-CM

## 2022-02-24 DIAGNOSIS — Z9889 Other specified postprocedural states: Secondary | ICD-10-CM

## 2022-02-24 DIAGNOSIS — M19011 Primary osteoarthritis, right shoulder: Secondary | ICD-10-CM

## 2022-02-24 DIAGNOSIS — G8929 Other chronic pain: Secondary | ICD-10-CM

## 2022-04-01 ENCOUNTER — Other Ambulatory Visit (HOSPITAL_COMMUNITY): Payer: Medicare Other

## 2022-04-20 NOTE — H&P (Signed)
KNEE ADMISSION H&P  Subjective:  Chief Complaint: Right knee pain.  HPI: [name], 76 y.o. female here today for right knee pain. She had a TKA by Dr Thurston Hole a few years ago. She has had swelling since then. She is falling now too because her knee gives away on her. She feels like the knee is very unstable. She has trouble sleeping because her knee gives her severe pain when she rolls over. She had her knee drained in July 2022 and fluid came right back. She was given Celebrex but it made her BP up, so she takes tylenol and or advil.  It was felt that the problems may have been related to her back and she was sent to a see Dr. Jule Ser, because she has had 3 back surgeries in the past. She states she was sent for a nerve muscle test, which was normal. She has had injections in her back which did not help the knee pain.  The patient denies active infection.  Patient Active Problem List   Diagnosis Date Noted   Precordial chest pain 12/31/2021   Primary localized osteoarthritis of right knee 10/19/2018   Right foot pain 10/19/2018   Incontinence of feces 09/21/2018   Vulvar lesion 07/20/2018   History of repair of right rotator cuff 03/03/2017   Primary osteoarthritis of both hands/ has had bilateral [facility] surgery  03/03/2017   History of hypertension 03/03/2017   History of hyperlipidemia 03/03/2017   DDD (degenerative disc disease), lumbar/ history of lumbar surgery  03/03/2017   Osteopenia of multiple sites 03/03/2017   Chronic left shoulder pain 01/20/2017   Primary osteoarthritis of both knees 01/20/2017   Trapezius muscle spasm 01/20/2017   Insomnia 01/20/2017   Fatigue 01/20/2017   Fibromyalgia 01/20/2017   High risk medications (not anticoagulants) long-term use 01/20/2017   PMR (polymyalgia rheumatica) (HCC) 01/20/2017   Left ovarian cyst 06/02/2015   Degenerative joint disease, shoulder, right 08/02/2013   Precordial pain 02/11/2012   HTN (hypertension) 02/11/2012    Hyperlipidemia 02/11/2012    Past Medical History:  Diagnosis Date   Bilateral ovarian cysts 09/12/2014   bilateral simple ovarian cysts.  [place] due in January 2018.   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 x 20 yr.   Hyperlipidemia    Hypothyroid    IBS (irritable bowel syndrome)    Impingement syndrome of right shoulder 07/2013   Polymyalgia rheumatica (HCC)    5-28  states she has been on prednisone and now is trying to only be on plaquenil to manage the polymyalgia    PONV (postoperative nausea and vomiting)    Primary localized osteoarthritis of right knee    Rupture of right rotator cuff 07/2013   VIN I (vulvar intraepithelial neoplasia I) 2017, 2018   this is from a perineal/vulvar biopsy.   Vitamin B12 deficiency     Past Surgical History:  Procedure Laterality Date   ANTERIOR LAT LUMBAR FUSION  10/08/2008   L3-4   BACK SURGERY     BACK SURGERY  10-03-14   --Dr. Trey Sailors at [facility]   COLONOSCOPY W/ BIOPSIES  11/2009   polyps   COLONOSCOPY W/ BIOPSIES  11/2012   polyp, recheck in 3 years   CYSTO  07/05/2006   CYSTOCELE REPAIR  07/05/2006   ESOPHAGOGASTRODUODENOSCOPY ENDOSCOPY  05/25/05   erythema and edema of gastric lumen  EYE SURGERY Right    muscle adjustment    FOOT SURGERY Right    GANGLION CYST EXCISION Right    wrist   INCONTINENCE SURGERY  07/05/2006   cystourethropexy   ORIF FOREARM FRACTURE Right 09/2012   SHOULDER ARTHROSCOPY WITH ROTATOR CUFF REPAIR AND SUBACROMIAL DECOMPRESSION Right 08/02/2013   Procedure: SHOULDER ARTHROSCOPY WITH ROTATOR CUFF REPAIR AND SUBACROMIAL DECOMPRESSION, EXTENSIVE DEBRIDEMENT (INCLUDING LABRUM), PARTIAL ACROMIPLASY WITH CORACOACROMIAL RELEASE, DISTAL CLAVICULECTOMY;  Surgeon: Loreta Ave, MD;  Location: [facility];  Service: Orthopedics;  Laterality: Right;   STRABISMUS SURGERY Right 01/26/2008    2 other surgeries previously   STRABISMUS SURGERY Right 06/03/2017   Procedure: REPAIR STRABISMUS RIGHT EYE;  Surgeon: Verne Carrow, MD;  Location: [facility];  Service: Ophthalmology;  Laterality: Right;   Thumb surgery  2/13 & ?2010   Bilateral - states a joint was replaced   TOTAL KNEE ARTHROPLASTY Right 02/12/2019   Procedure: TOTAL KNEE ARTHROPLASTY;  Surgeon: Salvatore Marvel, MD;  Location: [facility];  Service: Orthopedics;  Laterality: Right;   TOTAL VAGINAL HYSTERECTOMY  23 - or age 89   secondary AUB   TUBAL LIGATION     laparosopic BTL age 89   varicose veins Bilateral     Prior to Admission medications   Medication Sig Start Date End Date Taking? Authorizing Provider  amLODipine (NORVASC) 5 MG tablet Take 2.5-5 mg by mouth at bedtime. 12/23/21   [provider]  Ascorbic Acid (VITAMIN C PO) Take by mouth daily.    [provider]  benazepril (LOTENSIN) 40 MG tablet Take 40 mg by mouth daily.  05/25/18   [provider]  Calcium Carb-Cholecalciferol (CALCIUM 600 + D PO) Take 1 tablet by mouth daily.    [provider]  diclofenac Sodium (VOLTAREN) 1 % GEL APPLY 2 TO 4 GRAMS TO  AFFECTED JOINT TOPICALLY 4  TIMES DAILY AS NEEDED 12/21/21   Gearldine Bienenstock, PA-C  levothyroxine (SYNTHROID) 125 MCG tablet Take 125 mcg by mouth daily. 09/23/19   [provider]  METAMUCIL FIBER PO Take 6 tablets by mouth daily.    [provider]  metoprolol tartrate (LOPRESSOR) 100 MG tablet Take one tablet two hours prior to the test. 12/31/21   Rollene Rotunda, MD  Multiple Vitamin (MULTIVITAMIN) tablet Take 1 tablet by mouth daily.    [provider]  traZODone (DESYREL) 50 MG tablet Take 25-100 mg by mouth at bedtime as needed. 03/05/21   [provider]  vitamin B-12 (CYANOCOBALAMIN) 100 MCG tablet Take 100 mcg by mouth daily.    [provider]    Allergies  Allergen Reactions   Penicillins Itching    OF  MOUTH Did it involve swelling of the face/tongue/throat, SOB, or low BP? Unknown Did it involve sudden or severe rash/hives, skin peeling, or any reaction on the inside of your mouth or nose? Unknown Did you need to seek medical attention at a hospital or doctor's office? Unknown When did it last happen?     in my 17s If all above answers are "NO", may proceed with cephalosporin use.    Sulfa Antibiotics Itching    OF MOUTH    Social History   Socioeconomic History   Marital status: Married    Spouse name: Not on file   Number of children: 3   Years of education: 14   Highest education level: Not on file  Occupational History   Occupation: retired  Tobacco  Use   Smoking status: Never   Smokeless tobacco: Never  Vaping Use   Vaping Use: Never used  Substance and Sexual Activity   Alcohol use: No    Alcohol/week: 0.0 standard drinks of alcohol   Drug use: No   Sexual activity: Not Currently    Partners: Male    Birth control/protection: Surgical    Comment: Hyst  Other Topics Concern   Not on file  Social History Narrative   Lives at home with husband . One story home. Right handed   Social Determinants of Health   Financial Resource Strain: Not on file  Food Insecurity: Not on file  Transportation Needs: Not on file  Physical Activity: Not on file  Stress: Not on file  Social Connections: Not on file  Intimate Partner Violence: Not on file    Tobacco Use: Low Risk  (12/31/2021)   Patient History    Smoking Tobacco Use: Never    Smokeless Tobacco Use: Never    Passive Exposure: Not on file   Social History   Substance and Sexual Activity  Alcohol Use No   Alcohol/week: 0.0 standard drinks of alcohol    Family History  Problem Relation Age of Onset   Aortic aneurysm Father    Hypertension Father    Coronary artery disease Mother 76   Hypertension Mother    Hyperlipidemia Brother        brain and kidney tumor, no treatment needed    Aneurysm Brother     Healthy Daughter    Healthy Daughter    Healthy Daughter     Review of Systems  Constitutional:  Negative for chills and fever.  HENT: Negative.    Eyes: Negative.   Respiratory:  Negative for cough and shortness of breath.   Cardiovascular:  Negative for chest pain and palpitations.  Gastrointestinal:  Negative for abdominal pain, constipation, diarrhea, nausea and vomiting.  Genitourinary:  Negative for dysuria, frequency and urgency.  Musculoskeletal:  Positive for joint pain.  Skin:  Negative for rash.   Objective:  Physical Exam: Well nourished and well developed.  General: Alert and oriented x3, cooperative and pleasant, no acute distress.  Head: normocephalic, atraumatic, neck supple.  Eyes: EOMI.  Abdomen: non-tender to palpation and soft, normoactive bowel sounds. Musculoskeletal: Antalgic gait pattern favoring the right side without the use of assisted devices.   Right Hip Exam:   ROM: Normal without discomfort.  Right Knee Exam:   Moderate effusion. No warmth.   Range of motion is 3-110 degrees.   No crepitus on range of motion of the knee.   No specific tenderness noted.   Varus and valgus laxity in extension and AP laxity in flexion.   Left Knee Exam:   No effusion.   Range of motion is 0-125 degrees.   No crepitus on range of motion of the knee.   No medial or lateral joint line tenderness.   Stable knee.  Calves soft and nontender. Motor function intact in LE. Strength 5/5 LE bilaterally. Neuro: Distal pulses 2+. Sensation to light touch intact in LE.  Vital signs in last 24 hours: BP: ()/()  Arterial Line BP: ()/()   Imaging Review -AP and lateral of the right knee show the Attune prosthesis in good position. No periprosthetic abnormalities. -The bone scan showed no loosening.  Assessment/Plan:  Unstable right knee  The bone scan showed no loosening. This result, coupled with the normal labs and synovial analysis leads me to conclude that  her problem is secondary to instability and not loosening or infection. We discussed proceeding with a polyethylene revision and she agrees with that. The patient acknowledged the explanation, agreed to proceed with the plan and consent was signed. Patient is being admitted for inpatient treatment for surgery, pain control, PT, OT, prophylactic antibiotics, VTE prophylaxis, progressive ambulation and ADLs and discharge planning. The patient is planning to be discharged  home .  Patient's anticipated LOS is less than 2 midnights, meeting these requirements: - Lives within 1 hour of care - Has a competent adult at home to recover with post-op - NO history of  - Chronic pain requiring opiods  - Diabetes  - Coronary Artery Disease  - Heart failure  - Heart attack  - Stroke  - DVT/VTE  - Cardiac arrhythmia  - Respiratory Failure/COPD  - Renal failure  - Anemia  - Advanced Liver disease  Therapy Plans: [facility] Disposition: Home with Husband Planned DVT Prophylaxis: Aspirin 325 mg BID DME Needed: None PCP: Windle Guard, MD (requested cardiac clearance) Cardiologist: Rollene Rotunda, MD (EPIC note 12/31/2021) TXA: IV Allergies: penicillins (facial swelling), sulfa antibiotics (unsure) Anesthesia Concerns: N/V BMI: 27.9 Last HgbA1c: not diabetic  [facility]  Other: -Hx of 3 back surgeries  - Patient was instructed on what medications to stop prior to surgery. - Follow-up visit in 2 weeks with Dr. Lequita Halt - Begin physical therapy following surgery - Pre-operative lab work as pre-surgical testing - Prescriptions will be provided in hospital at time of discharge  R. Arcola Jansky, PA-C Orthopedic Surgery [facility] [place]

## 2022-05-01 NOTE — Progress Notes (Signed)
COVID Vaccine received:  []  No [x]  Yes Date of any COVID positive Test in last 90 days:  PCP - , MD Cardiologist - , MD  Chest x-ray - 12-07-21  Epic EKG - 12-31-21  Epic  Stress Test - 2013 Epic ECHO - 01-18-22  Epic Cardiac Cath -  CT coronary Calcium score = 0  (01-2022  Epic)  Pacemaker/ICD device     [x]  N/A Spinal Cord Stimulator:[x]  No []  Yes   Other Implants:   History of Sleep Apnea? []  No []  Yes   Sleep Study Date:   CPAP used?- []  No []  Yes  (Instruct to bring their mask & Tubing)  Does the patient monitor blood sugar? []  No []  Yes  []  N/A Does patient have a 03-20-22 or Dexacom? []  No []  Yes   Fasting Blood Sugar Ranges-  Checks Blood Sugar _____ times a day  Blood Thinner Instructions: Aspirin Instructions: Last Dose:  ERAS Protocol Ordered: []  No  [x]  Yes PRE-SURGERY [x]  ENSURE  []  G2   Comments:   Activity level: Patient can / can not climb a flight of stairs without difficulty;  []  No CP  []  No SOB,  but would have ______   Anesthesia review:  PONV, murmur - Echo showed Mild AS, HTN  Patient denies shortness of breath, fever, cough and chest pain at PAT appointment.  Patient verbalized understanding and agreement to the Pre-Surgical Instructions that were given to them at this PAT appointment. Patient was also educated of the need to review these PAT instructions again prior to his/her surgery.I reviewed the appropriate phone numbers to call if they have any and questions or concerns.

## 2022-05-01 NOTE — Patient Instructions (Addendum)
DUE TO SPACE LIMITATIONS, ONLY TWO VISITORS  (aged 74 and older) ARE ALLOWED TO COME WITH YOU AND STAY IN THE WAITING ROOM DURING YOUR PRE OP AND PROCEDURE.   **NO VISITORS ARE ALLOWED IN THE SHORT STAY AREA OR RECOVERY ROOM!!**  IF YOU WILL BE ADMITTED INTO THE HOSPITAL YOU ARE ALLOWED ONLY FOUR SUPPORT PEOPLE DURING VISITATION HOURS (7 AM -8PM)   The support person(s) must pass our screening, and use Hand sanitizing gel. Visitors GUEST BADGE MUST BE WORN VISIBLY  One adult visitor may remain with you overnight and MUST be in the room by 8 P.M.   You are not required to quarantine at this time prior to your surgery. However, you must do this: Hand Hygiene often Do NOT share personal items Notify your provider if you are in close contact with someone who has COVID or you develop fever 100.4 or greater, new onset of sneezing, cough, sore throat, shortness of breath or body aches.       Your procedure is scheduled on:  Wednesday May 12, 2022  Report to [facility] Main Entrance.  Report to admitting at: 08:45 AM  +++++Call this number if you have any questions or problems the morning of surgery [phone]  Do not eat food :After Midnight the night prior to your surgery/procedure.  After Midnight you may have the following liquids until  08:15  AM DAY OF SURGERY  Clear Liquid Diet Water Black Coffee (sugar ok, NO MILK/CREAM OR CREAMERS)  Tea (sugar ok, NO MILK/CREAM OR CREAMERS) regular and decaf                             Plain Jell-O (NO RED)                                           Fruit ices (not with fruit pulp, NO RED)                                     Popsicles (NO RED)                                                                  Juice: apple, WHITE grape, WHITE cranberry Sports drinks like Gatorade (NO RED)                   The day of surgery:  Drink ONE (1) Pre-Surgery Clear Ensure at  0815   AM the morning of surgery. Drink in one sitting. Do not  sip.  This drink was given to you during your hospital pre-op appointment visit. Nothing else to drink after completing the Pre-Surgery Clear Ensure   FOLLOW ANY ADDITIONAL PRE OP INSTRUCTIONS YOU RECEIVED FROM YOUR SURGEON'S OFFICE!!!   Oral Hygiene is also important to reduce your risk of infection.        Remember - BRUSH YOUR TEETH THE MORNING OF SURGERY WITH YOUR REGULAR TOOTHPASTE   Take ONLY these medicines the morning of surgery with A SIP OF WATER: Metoprolol, Amlodipine,  Levothyroxine                    You may not have any metal on your body including hair pins, jewelry, and body piercing  Do not wear make-up, lotions, powders, perfumes, or deodorant  Do not wear nail polish including gel and S&S, artificial / acrylic nails, or any other type of covering on natural nails including finger and toenails. If you have artificial nails, gel coating, etc., that needs to be removed by a nail salon, Please have this removed prior to surgery. Not doing so may mean that your surgery could be cancelled or delayed if the Surgeon or anesthesia staff feels like they are unable to monitor you safely.   Do not shave 48 hours prior to surgery to avoid nicks in your skin which may contribute to postoperative infections.   You may bring a small overnight bag with you on the day of surgery, only pack items that are not valuable .[name] IS NOT RESPONSIBLE   FOR VALUABLES THAT ARE LOST OR STOLEN.   DO NOT BRING YOUR HOME MEDICATIONS TO THE HOSPITAL. PHARMACY WILL DISPENSE MEDICATIONS LISTED ON YOUR MEDICATION LIST TO YOU DURING YOUR ADMISSION IN THE HOSPITAL!   Patients discharged on the day of surgery will not be allowed to drive home.  Someone NEEDS to stay with you for the first 24 hours after anesthesia.  Special Instructions: Bring a copy of your healthcare power of attorney and living will documents the day of surgery, if you wish to have them scanned into your [facility]-  EPIC  Please read over the following fact sheets you were given: IF YOU HAVE QUESTIONS ABOUT YOUR PRE-OP INSTRUCTIONS, PLEASE CALL [phone]  (KAY)   [place] - Preparing for Surgery Before surgery, you can play an important role.  Because skin is not sterile, your skin needs to be as free of germs as possible.  You can reduce the number of germs on your skin by washing with CHG (chlorahexidine gluconate) soap before surgery.  CHG is an antiseptic cleaner which kills germs and bonds with the skin to continue killing germs even after washing. Please DO NOT use if you have an allergy to CHG or antibacterial soaps.  If your skin becomes reddened/irritated stop using the CHG and inform your nurse when you arrive at Short Stay. Do not shave (including legs and underarms) for at least 48 hours prior to the first CHG shower.  You may shave your face/neck.  Please follow these instructions carefully:  1.  Shower with CHG Soap the night before surgery and the  morning of surgery.  2.  If you choose to wash your hair, wash your hair first as usual with your normal  shampoo.  3.  After you shampoo, rinse your hair and body thoroughly to remove the shampoo.                             4.  Use CHG as you would any other liquid soap.  You can apply chg directly to the skin and wash.  Gently with a scrungie or clean washcloth.  5.  Apply the CHG Soap to your body ONLY FROM THE NECK DOWN.   Do not use on face/ open                           Wound  or open sores. Avoid contact with eyes, ears mouth and genitals (private parts).                       Wash face,  Genitals (private parts) with your normal soap.             6.  Wash thoroughly, paying special attention to the area where your  surgery  will be performed.  7.  Thoroughly rinse your body with warm water from the neck down.  8.  DO NOT shower/wash with your normal soap after using and rinsing off the CHG Soap.            9.  Pat yourself dry with a  clean towel.            10.  Wear clean pajamas.            11.  Place clean sheets on your bed the night of your first shower and do not  sleep with pets.  ON THE DAY OF SURGERY : Do not apply any lotions/deodorants the morning of surgery.  Please wear clean clothes to the hospital/surgery center.    FAILURE TO FOLLOW THESE INSTRUCTIONS MAY RESULT IN THE CANCELLATION OF YOUR SURGERY  PATIENT SIGNATURE_________________________________  NURSE SIGNATURE__________________________________  ________________________________________________________________________        [name]    An incentive spirometer is a tool that can help keep your lungs clear and active. This tool measures how well you are filling your lungs with each breath. Taking long deep breaths may help reverse or decrease the chance of developing breathing (pulmonary) problems (especially infection) following: A long period of time when you are unable to move or be active. BEFORE THE PROCEDURE  If the spirometer includes an indicator to show your best effort, your nurse or respiratory therapist will set it to a desired goal. If possible, sit up straight or lean slightly forward. Try not to slouch. Hold the incentive spirometer in an upright position. INSTRUCTIONS FOR USE  Sit on the edge of your bed if possible, or sit up as far as you can in bed or on a chair. Hold the incentive spirometer in an upright position. Breathe out normally. Place the mouthpiece in your mouth and seal your lips tightly around it. Breathe in slowly and as deeply as possible, raising the piston or the ball toward the top of the column. Hold your breath for 3-5 seconds or for as long as possible. Allow the piston or ball to fall to the bottom of the column. Remove the mouthpiece from your mouth and breathe out normally. Rest for a few seconds and repeat Steps 1 through 7 at least 10 times every 1-2 hours when you are awake. Take  your time and take a few normal breaths between deep breaths. The spirometer may include an indicator to show your best effort. Use the indicator as a goal to work toward during each repetition. After each set of 10 deep breaths, practice coughing to be sure your lungs are clear. If you have an incision (the cut made at the time of surgery), support your incision when coughing by placing a pillow or rolled up towels firmly against it. Once you are able to get out of bed, walk around indoors and cough well. You may stop using the incentive spirometer when instructed by your caregiver.  RISKS AND COMPLICATIONS Take your time so you do not get dizzy or light-headed. If you are  in pain, you may need to take or ask for pain medication before doing incentive spirometry. It is harder to take a deep breath if you are having pain. AFTER USE Rest and breathe slowly and easily. It can be helpful to keep track of a log of your progress. Your caregiver can provide you with a simple table to help with this. If you are using the spirometer at home, follow these instructions: SEEK MEDICAL CARE IF:  You are having difficultly using the spirometer. You have trouble using the spirometer as often as instructed. Your pain medication is not giving enough relief while using the spirometer. You develop fever of 100.5 F (38.1 C) or higher.                                                                                                    SEEK IMMEDIATE MEDICAL CARE IF:  You cough up bloody sputum that had not been present before. You develop fever of 102 F (38.9 C) or greater. You develop worsening pain at or near the incision site. MAKE SURE YOU:  Understand these instructions. Will watch your condition. Will get help right away if you are not doing well or get worse. Document Released: 01/10/2007 Document Revised: 11/22/2011 Document Reviewed: 03/13/2007 [facility] Patient Information 2014 [place], [place].

## 2022-05-04 ENCOUNTER — Encounter (HOSPITAL_COMMUNITY): Payer: Self-pay

## 2022-05-04 ENCOUNTER — Encounter (HOSPITAL_COMMUNITY)
Admission: RE | Admit: 2022-05-04 | Discharge: 2022-05-04 | Disposition: A | Discharge status: Home / Self Care | Payer: Medicare Other | Source: Ambulatory Visit | Attending: Orthopedic Surgery | Admitting: Orthopedic Surgery

## 2022-05-04 ENCOUNTER — Other Ambulatory Visit: Payer: Self-pay

## 2022-05-04 VITALS — BP 144/86 | HR 100 | Temp 98.9°F | Resp 18 | Ht 68.0 in | Wt 177.0 lb

## 2022-05-04 DIAGNOSIS — I10 Essential (primary) hypertension: Secondary | ICD-10-CM | POA: Insufficient documentation

## 2022-05-04 DIAGNOSIS — Z96651 Presence of right artificial knee joint: Secondary | ICD-10-CM | POA: Diagnosis not present

## 2022-05-04 DIAGNOSIS — Z01812 Encounter for preprocedural laboratory examination: Secondary | ICD-10-CM | POA: Insufficient documentation

## 2022-05-04 DIAGNOSIS — Y798 Miscellaneous orthopedic devices associated with adverse incidents, not elsewhere classified: Secondary | ICD-10-CM | POA: Insufficient documentation

## 2022-05-04 DIAGNOSIS — T8489XA Other specified complication of internal orthopedic prosthetic devices, implants and grafts, initial encounter: Secondary | ICD-10-CM | POA: Insufficient documentation

## 2022-05-04 DIAGNOSIS — Z01818 Encounter for other preprocedural examination: Secondary | ICD-10-CM

## 2022-05-04 DIAGNOSIS — Y838 Other surgical procedures as the cause of abnormal reaction of the patient, or of later complication, without mention of misadventure at the time of the procedure: Secondary | ICD-10-CM | POA: Diagnosis not present

## 2022-05-04 LAB — CBC
HCT: 42.7 % (ref 36.0–46.0)
Hemoglobin: 13.3 g/dL (ref 12.0–15.0)
MCH: 27.9 pg (ref 26.0–34.0)
MCHC: 31.1 g/dL (ref 30.0–36.0)
MCV: 89.7 fL (ref 80.0–100.0)
Platelets: 241 10*3/uL (ref 150–400)
RBC: 4.76 MIL/uL (ref 3.87–5.11)
RDW: 14.8 % (ref 11.5–15.5)
WBC: 12.4 10*3/uL — ABNORMAL HIGH (ref 4.0–10.5)
nRBC: 0 % (ref 0.0–0.2)

## 2022-05-04 LAB — BASIC METABOLIC PANEL
Anion gap: 6 (ref 5–15)
BUN: 15 mg/dL (ref 8–23)
CO2: 25 mmol/L (ref 22–32)
Calcium: 9.2 mg/dL (ref 8.9–10.3)
Chloride: 111 mmol/L (ref 98–111)
Creatinine, Ser: 0.71 mg/dL (ref 0.44–1.00)
GFR, Estimated: >60 mL/min (ref >60)
Glucose, Bld: 102 mg/dL — ABNORMAL HIGH (ref 70–99)
Potassium: 4.4 mmol/L (ref 3.5–5.1)
Sodium: 142 mmol/L (ref 135–145)

## 2022-05-04 LAB — SURGICAL PCR SCREEN
MRSA, PCR: NEGATIVE
Staphylococcus aureus: NEGATIVE

## 2022-05-05 NOTE — Progress Notes (Signed)
Anesthesia Chart Review   Case: [id number] Date/Time: 05/12/22 1100   Procedure: Right knee polyethylene versus total knee arthroplasty revision (Right: Knee)   Anesthesia type: Choice   Pre-op diagnosis: unstable right total knee arthroplasty   Location: [facility] ROOM 10 / [facility]   Surgeons: Ollen Gross, MD       DISCUSSION:76 y.o. never smoker with h/o PONV, HTN, mild AS, unstable right total knee arthroplasty scheduled for above procedure 05/12/2022 with Dr. Ollen Gross.   Pt seen by cardiology for preoperative evaluation 12/31/2021. CT coronary ordered with no significant CAD.   Anticipate pt can proceed with planned procedure barring acute status change.   VS: BP (!) 144/86 Comment: Right arm sitting  Pulse 100   Temp 37.2 C (Oral)   Resp 18   Ht 5\' 8"  (1.727 m)   Wt 80.3 kg   LMP 09/13/1984 (Approximate)   SpO2 97%   BMI 26.91 kg/m   PROVIDERS: 11/11/1984, MD is PCP   Kaleen Mask, MD is Cardiologist  LABS: Labs reviewed: Acceptable for surgery. (all labs ordered are listed, but only abnormal results are displayed)  Labs Reviewed  BASIC METABOLIC PANEL - Abnormal; Notable for the following components:      Result Value   Glucose, Bld 102 (*)    All other components within normal limits  CBC - Abnormal; Notable for the following components:   WBC 12.4 (*)    All other components within normal limits  SURGICAL PCR SCREEN  TYPE AND SCREEN     IMAGES:   EKG:   CV: CT Coronary 01/13/2022 IMPRESSION: 1. Coronary artery calcium score 0 Agatston units. This suggests low risk for future cardiac events.   2.  No significant coronary disease noted.  Echo 01/18/2022 1. Left ventricular ejection fraction, by estimation, is 55 to 60%. The  left ventricle has normal function. The left ventricle has no regional  wall motion abnormalities. Left ventricular diastolic parameters are  consistent with Grade I diastolic  dysfunction (impaired relaxation).    2. Right ventricular systolic function is normal. The right ventricular  size is normal. There is normal pulmonary artery systolic pressure. The  estimated right ventricular systolic pressure is 24.7 mmHg.   3. The mitral valve is normal in structure. Trivial mitral valve  regurgitation. No evidence of mitral stenosis.   4. The aortic valve is tricuspid. There is moderate calcification of the  aortic valve. Aortic valve regurgitation is trivial. Mild aortic valve  stenosis. Aortic valve area, by VTI measures 1.54 cm. Aortic valve mean  gradient measures 13.0 mmHg.   5. The inferior vena cava is normal in size with greater than 50%  respiratory variability, suggesting right atrial pressure of 3 mmHg. Past Medical History:  Diagnosis Date   Bilateral ovarian cysts 09/12/2014   bilateral simple ovarian cysts.  09/14/2014 due in January 2018.   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 x 20 yr.   Hyperlipidemia    Hypothyroid    IBS (irritable bowel syndrome)    Impingement syndrome of right shoulder 07/2013   Polymyalgia rheumatica (HCC)    5-28  states she has been on prednisone and now is trying to only be on plaquenil to manage the polymyalgia    PONV (postoperative nausea and vomiting)    Primary localized osteoarthritis of right knee    Rupture of right  rotator cuff 07/2013   VIN I (vulvar intraepithelial neoplasia I) 2017, 2018   this is from a perineal/vulvar biopsy.   Vitamin B12 deficiency     Past Surgical History:  Procedure Laterality Date   ANTERIOR LAT LUMBAR FUSION  10/08/2008   L3-4   BACK SURGERY     BACK SURGERY  10-03-14   --Dr. Trey Sailors at [facility]   COLONOSCOPY W/ BIOPSIES  11/2009   polyps   COLONOSCOPY W/ BIOPSIES  11/2012   polyp, recheck in 3 years   CYSTO  07/05/2006   CYSTOCELE REPAIR  07/05/2006   ESOPHAGOGASTRODUODENOSCOPY ENDOSCOPY  05/25/05    erythema and edema of gastric lumen   EYE SURGERY Right    muscle adjustment    FOOT SURGERY Right    GANGLION CYST EXCISION Right    wrist   INCONTINENCE SURGERY  07/05/2006   cystourethropexy   ORIF FOREARM FRACTURE Right 09/2012   SHOULDER ARTHROSCOPY WITH ROTATOR CUFF REPAIR AND SUBACROMIAL DECOMPRESSION Right 08/02/2013   Procedure: SHOULDER ARTHROSCOPY WITH ROTATOR CUFF REPAIR AND SUBACROMIAL DECOMPRESSION, EXTENSIVE DEBRIDEMENT (INCLUDING LABRUM), PARTIAL ACROMIPLASY WITH CORACOACROMIAL RELEASE, DISTAL CLAVICULECTOMY;  Surgeon: Loreta Ave, MD;  Location: [facility];  Service: Orthopedics;  Laterality: Right;   STRABISMUS SURGERY Right 01/26/2008   2 other surgeries previously   STRABISMUS SURGERY Right 06/03/2017   Procedure: REPAIR STRABISMUS RIGHT EYE;  Surgeon: Verne Carrow, MD;  Location: [facility];  Service: Ophthalmology;  Laterality: Right;   Thumb surgery  2/13 & ?2010   Bilateral - states a joint was replaced   TOTAL KNEE ARTHROPLASTY Right 02/12/2019   Procedure: TOTAL KNEE ARTHROPLASTY;  Surgeon: Salvatore Marvel, MD;  Location: [facility];  Service: Orthopedics;  Laterality: Right;   TOTAL VAGINAL HYSTERECTOMY  1986 - or age 58   secondary AUB   TUBAL LIGATION     laparosopic BTL age 89   varicose veins Bilateral     MEDICATIONS:  amLODipine (NORVASC) 5 MG tablet   benazepril (LOTENSIN) 40 MG tablet   Calcium Carb-Cholecalciferol (CALCIUM 600 + D PO)   cyanocobalamin (VITAMIN B12) 1000 MCG tablet   diclofenac Sodium (VOLTAREN) 1 % GEL   levothyroxine (SYNTHROID) 125 MCG tablet   Magnesium 250 MG TABS   METAMUCIL FIBER PO   Multiple Vitamin (MULTIVITAMIN) tablet   No current facility-administered medications for this encounter.     Jodell Cipro Ward, PA-C [facility] [phone]

## 2022-05-12 ENCOUNTER — Other Ambulatory Visit: Payer: Self-pay

## 2022-05-12 ENCOUNTER — Encounter (HOSPITAL_COMMUNITY)
Admission: RE | Disposition: A | Discharge status: Home / Self Care | Payer: Self-pay | Source: Home / Self Care | Attending: Orthopedic Surgery

## 2022-05-12 ENCOUNTER — Encounter (HOSPITAL_COMMUNITY): Payer: Self-pay | Admitting: Orthopedic Surgery

## 2022-05-12 ENCOUNTER — Inpatient Hospital Stay (HOSPITAL_COMMUNITY): Payer: Medicare Other | Admitting: Physician Assistant

## 2022-05-12 ENCOUNTER — Inpatient Hospital Stay (HOSPITAL_COMMUNITY)
Admission: RE | Admit: 2022-05-12 | Discharge: 2022-05-13 | DRG: 489 | Disposition: A | Discharge status: Home / Self Care | Payer: Medicare Other | Attending: Orthopedic Surgery | Admitting: Orthopedic Surgery

## 2022-05-12 ENCOUNTER — Inpatient Hospital Stay (HOSPITAL_COMMUNITY): Payer: Medicare Other | Admitting: Certified Registered Nurse Anesthetist

## 2022-05-12 DIAGNOSIS — Z8249 Family history of ischemic heart disease and other diseases of the circulatory system: Secondary | ICD-10-CM

## 2022-05-12 DIAGNOSIS — Z79890 Hormone replacement therapy (postmenopausal): Secondary | ICD-10-CM

## 2022-05-12 DIAGNOSIS — M19041 Primary osteoarthritis, right hand: Secondary | ICD-10-CM | POA: Diagnosis present

## 2022-05-12 DIAGNOSIS — M19011 Primary osteoarthritis, right shoulder: Secondary | ICD-10-CM | POA: Diagnosis present

## 2022-05-12 DIAGNOSIS — E039 Hypothyroidism, unspecified: Secondary | ICD-10-CM | POA: Diagnosis present

## 2022-05-12 DIAGNOSIS — M797 Fibromyalgia: Secondary | ICD-10-CM | POA: Diagnosis present

## 2022-05-12 DIAGNOSIS — Z880 Allergy status to penicillin: Secondary | ICD-10-CM | POA: Diagnosis not present

## 2022-05-12 DIAGNOSIS — E785 Hyperlipidemia, unspecified: Secondary | ICD-10-CM | POA: Diagnosis present

## 2022-05-12 DIAGNOSIS — Z83438 Family history of other disorder of lipoprotein metabolism and other lipidemia: Secondary | ICD-10-CM | POA: Diagnosis not present

## 2022-05-12 DIAGNOSIS — Y792 Prosthetic and other implants, materials and accessory orthopedic devices associated with adverse incidents: Secondary | ICD-10-CM | POA: Diagnosis present

## 2022-05-12 DIAGNOSIS — M353 Polymyalgia rheumatica: Secondary | ICD-10-CM | POA: Diagnosis present

## 2022-05-12 DIAGNOSIS — M8589 Other specified disorders of bone density and structure, multiple sites: Secondary | ICD-10-CM | POA: Diagnosis present

## 2022-05-12 DIAGNOSIS — T84022A Instability of internal right knee prosthesis, initial encounter: Secondary | ICD-10-CM

## 2022-05-12 DIAGNOSIS — I10 Essential (primary) hypertension: Secondary | ICD-10-CM | POA: Diagnosis present

## 2022-05-12 DIAGNOSIS — Z79899 Other long term (current) drug therapy: Secondary | ICD-10-CM | POA: Diagnosis not present

## 2022-05-12 DIAGNOSIS — M19042 Primary osteoarthritis, left hand: Secondary | ICD-10-CM | POA: Diagnosis present

## 2022-05-12 DIAGNOSIS — Z87412 Personal history of vulvar dysplasia: Secondary | ICD-10-CM | POA: Diagnosis not present

## 2022-05-12 DIAGNOSIS — T84012A Broken internal right knee prosthesis, initial encounter: Secondary | ICD-10-CM | POA: Diagnosis present

## 2022-05-12 DIAGNOSIS — M25261 Flail joint, right knee: Secondary | ICD-10-CM | POA: Diagnosis present

## 2022-05-12 DIAGNOSIS — T84018A Broken internal joint prosthesis, other site, initial encounter: Principal | ICD-10-CM

## 2022-05-12 DIAGNOSIS — Z882 Allergy status to sulfonamides status: Secondary | ICD-10-CM | POA: Diagnosis not present

## 2022-05-12 HISTORY — PX: TOTAL KNEE REVISION: SHX996

## 2022-05-12 LAB — TYPE AND SCREEN
ABO/RH(D): O POS
Antibody Screen: NEGATIVE

## 2022-05-12 SURGERY — TOTAL KNEE REVISION
Anesthesia: General | Site: Knee | Laterality: Right

## 2022-05-12 MED ORDER — FENTANYL CITRATE PF 50 MCG/ML IJ SOSY
PREFILLED_SYRINGE | INTRAMUSCULAR | Status: AC
  Administered 2022-05-12: 50 ug via INTRAVENOUS
  Filled 2022-05-12: qty 3

## 2022-05-12 MED ORDER — FENTANYL CITRATE PF 50 MCG/ML IJ SOSY
25.0000 ug | PREFILLED_SYRINGE | INTRAMUSCULAR | Status: DC | PRN
  Administered 2022-05-12: 25 ug via INTRAVENOUS

## 2022-05-12 MED ORDER — METHOCARBAMOL 500 MG IVPB - SIMPLE MED
500.0000 mg | Freq: Four times a day (QID) | INTRAVENOUS | Status: DC | PRN
  Filled 2022-05-12: qty 55

## 2022-05-12 MED ORDER — LEVOTHYROXINE SODIUM 125 MCG PO TABS
125.0000 ug | ORAL_TABLET | Freq: Every day | ORAL | Status: DC
  Administered 2022-05-13: 125 ug via ORAL
  Filled 2022-05-12: qty 1

## 2022-05-12 MED ORDER — 0.9 % SODIUM CHLORIDE (POUR BTL) OPTIME
TOPICAL | Status: DC | PRN
  Administered 2022-05-12: 1000 mL

## 2022-05-12 MED ORDER — PROPOFOL 500 MG/50ML IV EMUL
INTRAVENOUS | Status: DC | PRN
  Administered 2022-05-12: 150 ug/kg/min via INTRAVENOUS

## 2022-05-12 MED ORDER — ONDANSETRON HCL 4 MG PO TABS: 4.0000 mg | ORAL_TABLET | Freq: Four times a day (QID) | ORAL | Status: DC | PRN

## 2022-05-12 MED ORDER — ONDANSETRON HCL 4 MG/2ML IJ SOLN: 4.0000 mg | Freq: Four times a day (QID) | INTRAMUSCULAR | Status: DC | PRN

## 2022-05-12 MED ORDER — OXYCODONE HCL 5 MG PO TABS
5.0000 mg | ORAL_TABLET | ORAL | Status: DC | PRN
  Administered 2022-05-12: 10 mg via ORAL
  Administered 2022-05-12: 5 mg via ORAL
  Administered 2022-05-13: 10 mg via ORAL
  Filled 2022-05-12 (×2): qty 2

## 2022-05-12 MED ORDER — POLYETHYLENE GLYCOL 3350 17 G PO PACK
17.0000 g | PACK | Freq: Every day | ORAL | Status: DC | PRN
Start: 2022-05-12 — End: 2022-05-13

## 2022-05-12 MED ORDER — ACETAMINOPHEN 500 MG PO TABS
1000.0000 mg | ORAL_TABLET | Freq: Four times a day (QID) | ORAL | Status: DC
  Administered 2022-05-12 – 2022-05-13 (×2): 1000 mg via ORAL
  Filled 2022-05-12 (×2): qty 2

## 2022-05-12 MED ORDER — OXYCODONE HCL 5 MG PO TABS
ORAL_TABLET | ORAL | Status: AC
  Filled 2022-05-12: qty 1

## 2022-05-12 MED ORDER — DIPHENHYDRAMINE HCL 12.5 MG/5ML PO ELIX: 12.5000 mg | ORAL_SOLUTION | ORAL | Status: DC | PRN

## 2022-05-12 MED ORDER — METOCLOPRAMIDE HCL 5 MG PO TABS: 5.0000 mg | ORAL_TABLET | Freq: Three times a day (TID) | ORAL | Status: DC | PRN

## 2022-05-12 MED ORDER — SODIUM CHLORIDE 0.9 % IV SOLN: INTRAVENOUS | Status: DC

## 2022-05-12 MED ORDER — CEFAZOLIN SODIUM-DEXTROSE 2-4 GM/100ML-% IV SOLN
2.0000 g | INTRAVENOUS | Status: AC
  Administered 2022-05-12: 2 g via INTRAVENOUS
  Filled 2022-05-12: qty 100

## 2022-05-12 MED ORDER — DEXAMETHASONE SODIUM PHOSPHATE 10 MG/ML IJ SOLN
10.0000 mg | Freq: Once | INTRAMUSCULAR | Status: AC
  Administered 2022-05-13: 10 mg via INTRAVENOUS
  Filled 2022-05-12: qty 1

## 2022-05-12 MED ORDER — ROPIVACAINE HCL 5 MG/ML IJ SOLN
INTRAMUSCULAR | Status: DC | PRN
  Administered 2022-05-12: 20 mL via PERINEURAL

## 2022-05-12 MED ORDER — MENTHOL 3 MG MT LOZG: 1.0000 | LOZENGE | OROMUCOSAL | Status: DC | PRN

## 2022-05-12 MED ORDER — PROPOFOL 10 MG/ML IV BOLUS
INTRAVENOUS | Status: DC | PRN
  Administered 2022-05-12: 30 mg via INTRAVENOUS
  Administered 2022-05-12: 130 mg via INTRAVENOUS

## 2022-05-12 MED ORDER — LIDOCAINE 2% (20 MG/ML) 5 ML SYRINGE
INTRAMUSCULAR | Status: DC | PRN
  Administered 2022-05-12: 80 mg via INTRAVENOUS

## 2022-05-12 MED ORDER — SUGAMMADEX SODIUM 200 MG/2ML IV SOLN
INTRAVENOUS | Status: DC | PRN
  Administered 2022-05-12: 200 mg via INTRAVENOUS

## 2022-05-12 MED ORDER — FENTANYL CITRATE PF 50 MCG/ML IJ SOSY
PREFILLED_SYRINGE | INTRAMUSCULAR | Status: AC
  Administered 2022-05-12: 50 ug via INTRAVENOUS
  Filled 2022-05-12: qty 2

## 2022-05-12 MED ORDER — PHENOL 1.4 % MT LIQD: 1.0000 | OROMUCOSAL | Status: DC | PRN

## 2022-05-12 MED ORDER — SODIUM CHLORIDE 0.9 % IR SOLN
Status: DC | PRN
  Administered 2022-05-12: 1000 mL

## 2022-05-12 MED ORDER — ASPIRIN 325 MG PO TBEC
325.0000 mg | DELAYED_RELEASE_TABLET | Freq: Two times a day (BID) | ORAL | Status: DC
  Administered 2022-05-13: 325 mg via ORAL
  Filled 2022-05-12: qty 1

## 2022-05-12 MED ORDER — LACTATED RINGERS IV SOLN: INTRAVENOUS | Status: DC

## 2022-05-12 MED ORDER — FENTANYL CITRATE (PF) 100 MCG/2ML IJ SOLN
INTRAMUSCULAR | Status: DC | PRN
Start: 2022-05-12 — End: 2022-05-12
  Administered 2022-05-12 (×2): 50 ug via INTRAVENOUS

## 2022-05-12 MED ORDER — HYDROMORPHONE HCL 2 MG/ML IJ SOLN
INTRAMUSCULAR | Status: AC
  Filled 2022-05-12: qty 1

## 2022-05-12 MED ORDER — TRAMADOL HCL 50 MG PO TABS: 50.0000 mg | ORAL_TABLET | Freq: Four times a day (QID) | ORAL | Status: DC | PRN

## 2022-05-12 MED ORDER — FENTANYL CITRATE (PF) 100 MCG/2ML IJ SOLN
INTRAMUSCULAR | Status: AC
  Filled 2022-05-12: qty 2

## 2022-05-12 MED ORDER — CHLORHEXIDINE GLUCONATE 0.12 % MT SOLN
15.0000 mL | Freq: Once | OROMUCOSAL | Status: AC
  Administered 2022-05-12: 15 mL via OROMUCOSAL

## 2022-05-12 MED ORDER — BUPIVACAINE LIPOSOME 1.3 % IJ SUSP: 20.0000 mL | Freq: Once | INTRAMUSCULAR | Status: DC

## 2022-05-12 MED ORDER — DEXAMETHASONE SODIUM PHOSPHATE 10 MG/ML IJ SOLN
8.0000 mg | Freq: Once | INTRAMUSCULAR | Status: AC
  Administered 2022-05-12: 8 mg via INTRAVENOUS

## 2022-05-12 MED ORDER — SODIUM CHLORIDE (PF) 0.9 % IJ SOLN
INTRAMUSCULAR | Status: AC
  Filled 2022-05-12: qty 10

## 2022-05-12 MED ORDER — ONDANSETRON HCL 4 MG/2ML IJ SOLN
INTRAMUSCULAR | Status: DC | PRN
  Administered 2022-05-12: 4 mg via INTRAVENOUS

## 2022-05-12 MED ORDER — SODIUM CHLORIDE 0.9 % IV SOLN
INTRAVENOUS | Status: DC | PRN
  Administered 2022-05-12: 80 mL

## 2022-05-12 MED ORDER — AMLODIPINE BESYLATE 5 MG PO TABS: 2.5000 mg | ORAL_TABLET | Freq: Every evening | ORAL | Status: DC | PRN

## 2022-05-12 MED ORDER — CEFAZOLIN SODIUM-DEXTROSE 2-4 GM/100ML-% IV SOLN
2.0000 g | Freq: Four times a day (QID) | INTRAVENOUS | Status: AC
  Administered 2022-05-12 – 2022-05-13 (×2): 2 g via INTRAVENOUS
  Filled 2022-05-12 (×2): qty 100

## 2022-05-12 MED ORDER — TRANEXAMIC ACID-NACL 1000-0.7 MG/100ML-% IV SOLN
1000.0000 mg | INTRAVENOUS | Status: AC
  Administered 2022-05-12: 1000 mg via INTRAVENOUS
  Filled 2022-05-12: qty 100

## 2022-05-12 MED ORDER — BUPIVACAINE LIPOSOME 1.3 % IJ SUSP
INTRAMUSCULAR | Status: AC
  Filled 2022-05-12: qty 20

## 2022-05-12 MED ORDER — ROCURONIUM BROMIDE 10 MG/ML (PF) SYRINGE
PREFILLED_SYRINGE | INTRAVENOUS | Status: DC | PRN
  Administered 2022-05-12: 50 mg via INTRAVENOUS

## 2022-05-12 MED ORDER — BISACODYL 10 MG RE SUPP: 10.0000 mg | Freq: Every day | RECTAL | Status: DC | PRN

## 2022-05-12 MED ORDER — POVIDONE-IODINE 10 % EX SWAB
2.0000 | Freq: Once | CUTANEOUS | Status: AC
  Administered 2022-05-12: 2 via TOPICAL

## 2022-05-12 MED ORDER — MORPHINE SULFATE (PF) 2 MG/ML IV SOLN
1.0000 mg | INTRAVENOUS | Status: DC | PRN
  Administered 2022-05-12: 2 mg via INTRAVENOUS
  Filled 2022-05-12: qty 1

## 2022-05-12 MED ORDER — ORAL CARE MOUTH RINSE: 15.0000 mL | Freq: Once | OROMUCOSAL | Status: AC

## 2022-05-12 MED ORDER — METOCLOPRAMIDE HCL 5 MG/ML IJ SOLN: 5.0000 mg | Freq: Three times a day (TID) | INTRAMUSCULAR | Status: DC | PRN

## 2022-05-12 MED ORDER — ACETAMINOPHEN 10 MG/ML IV SOLN
1000.0000 mg | Freq: Four times a day (QID) | INTRAVENOUS | Status: DC
  Administered 2022-05-12: 1000 mg via INTRAVENOUS
  Filled 2022-05-12 (×2): qty 100

## 2022-05-12 MED ORDER — SODIUM CHLORIDE (PF) 0.9 % IJ SOLN
INTRAMUSCULAR | Status: AC
  Filled 2022-05-12: qty 50

## 2022-05-12 MED ORDER — METHOCARBAMOL 500 MG PO TABS: 500.0000 mg | ORAL_TABLET | Freq: Four times a day (QID) | ORAL | Status: DC | PRN

## 2022-05-12 MED ORDER — ONDANSETRON HCL 4 MG/2ML IJ SOLN: 4.0000 mg | Freq: Once | INTRAMUSCULAR | Status: DC | PRN

## 2022-05-12 MED ORDER — HYDROMORPHONE HCL 1 MG/ML IJ SOLN
INTRAMUSCULAR | Status: DC | PRN
  Administered 2022-05-12 (×2): .4 mg via INTRAVENOUS

## 2022-05-12 MED ORDER — FENTANYL CITRATE PF 50 MCG/ML IJ SOSY: 50.0000 ug | PREFILLED_SYRINGE | INTRAMUSCULAR | Status: DC

## 2022-05-12 MED ORDER — FLEET ENEMA 7-19 GM/118ML RE ENEM: 1.0000 | ENEMA | Freq: Once | RECTAL | Status: DC | PRN

## 2022-05-12 MED ORDER — DOCUSATE SODIUM 100 MG PO CAPS
100.0000 mg | ORAL_CAPSULE | Freq: Two times a day (BID) | ORAL | Status: DC
  Administered 2022-05-12 – 2022-05-13 (×2): 100 mg via ORAL
  Filled 2022-05-12 (×2): qty 1

## 2022-05-12 SURGICAL SUPPLY — 62 items
ATTUNE PSRP INSR SZ7 10 KNEE (Insert) IMPLANT
BAG COUNTER SPONGE SURGICOUNT (BAG) IMPLANT
BAG DECANTER FOR FLEXI CONT (MISCELLANEOUS) ×1 IMPLANT
BAG ZIPLOCK 12X15 (MISCELLANEOUS) IMPLANT
BLADE SAG 18X100X1.27 (BLADE) ×1 IMPLANT
BLADE SAW SGTL 11.0X1.19X90.0M (BLADE) ×1 IMPLANT
BLADE SURG SZ10 CARB STEEL (BLADE) IMPLANT
BNDG ELASTIC 6X5.8 VLCR STR LF (GAUZE/BANDAGES/DRESSINGS) ×1 IMPLANT
COVER SURGICAL LIGHT HANDLE (MISCELLANEOUS) ×1 IMPLANT
CUFF TOURN SGL QUICK 34 (TOURNIQUET CUFF) ×1
CUFF TRNQT CYL 34X4.125X (TOURNIQUET CUFF) ×1 IMPLANT
DRAPE INCISE IOBAN 66X45 STRL (DRAPES) ×1 IMPLANT
DRAPE U-SHAPE 47X51 STRL (DRAPES) ×1 IMPLANT
DRSG ADAPTIC 3X8 NADH LF (GAUZE/BANDAGES/DRESSINGS) ×1 IMPLANT
DRSG AQUACEL AG ADV 3.5X10 (GAUZE/BANDAGES/DRESSINGS) IMPLANT
DURAPREP 26ML APPLICATOR (WOUND CARE) ×1 IMPLANT
ELECT REM PT RETURN 15FT ADLT (MISCELLANEOUS) ×1 IMPLANT
EVACUATOR 1/8 PVC DRAIN (DRAIN) IMPLANT
GAUZE PAD ABD 8X10 STRL (GAUZE/BANDAGES/DRESSINGS) ×1 IMPLANT
GAUZE SPONGE 4X4 12PLY STRL (GAUZE/BANDAGES/DRESSINGS) ×1 IMPLANT
GLOVE BIO SURGEON STRL SZ 6.5 (GLOVE) ×2 IMPLANT
GLOVE BIO SURGEON STRL SZ7 (GLOVE) ×1 IMPLANT
GLOVE BIO SURGEON STRL SZ8 (GLOVE) ×2 IMPLANT
GLOVE BIOGEL PI IND STRL 7.0 (GLOVE) ×1 IMPLANT
GLOVE BIOGEL PI IND STRL 8 (GLOVE) ×1 IMPLANT
GLOVE BIOGEL PI IND STRL 8.5 (GLOVE) IMPLANT
GLOVE BIOGEL PI INDICATOR 7.0 (GLOVE) ×1
GLOVE BIOGEL PI INDICATOR 8 (GLOVE) ×1
GLOVE BIOGEL PI INDICATOR 8.5 (GLOVE)
GOWN SPEC L4 XLG W/TWL (GOWN DISPOSABLE) ×2 IMPLANT
GOWN STRL REUS W/ TWL LRG LVL3 (GOWN DISPOSABLE) ×1 IMPLANT
GOWN STRL REUS W/ TWL XL LVL3 (GOWN DISPOSABLE) IMPLANT
GOWN STRL REUS W/TWL LRG LVL3 (GOWN DISPOSABLE) ×1
GOWN STRL REUS W/TWL XL LVL3 (GOWN DISPOSABLE)
HANDPIECE INTERPULSE COAX TIP (DISPOSABLE) ×1
HOLDER FOLEY CATH W/STRAP (MISCELLANEOUS) IMPLANT
IMMOBILIZER KNEE 20 (SOFTGOODS) ×1
IMMOBILIZER KNEE 20 THIGH 36 (SOFTGOODS) ×1 IMPLANT
KIT TURNOVER KIT A (KITS) IMPLANT
MANIFOLD NEPTUNE II (INSTRUMENTS) ×1 IMPLANT
NS IRRIG 1000ML POUR BTL (IV SOLUTION) ×1 IMPLANT
PACK TOTAL KNEE CUSTOM (KITS) ×1 IMPLANT
PADDING CAST ABS COTTON 6X4 NS (CAST SUPPLIES) IMPLANT
PADDING CAST COTTON 6X4 STRL (CAST SUPPLIES) ×2 IMPLANT
PROTECTOR NERVE ULNAR (MISCELLANEOUS) ×1 IMPLANT
SET HNDPC FAN SPRY TIP SCT (DISPOSABLE) ×1 IMPLANT
SPIKE FLUID TRANSFER (MISCELLANEOUS) IMPLANT
STRIP CLOSURE SKIN 1/2X4 (GAUZE/BANDAGES/DRESSINGS) ×1 IMPLANT
SUT MNCRL AB 4-0 PS2 18 (SUTURE) ×1 IMPLANT
SUT STRATAFIX 0 PDS 27 VIOLET (SUTURE) ×1
SUT VIC AB 2-0 CT1 27 (SUTURE) ×3
SUT VIC AB 2-0 CT1 TAPERPNT 27 (SUTURE) ×3 IMPLANT
SUTURE STRATFX 0 PDS 27 VIOLET (SUTURE) ×1 IMPLANT
SWAB COLLECTION DEVICE MRSA (MISCELLANEOUS) IMPLANT
SWAB CULTURE ESWAB REG 1ML (MISCELLANEOUS) IMPLANT
SYR 50ML LL SCALE MARK (SYRINGE) ×2 IMPLANT
TOWER CARTRIDGE SMART MIX (DISPOSABLE) ×1 IMPLANT
TRAY FOLEY MTR SLVR 16FR STAT (SET/KITS/TRAYS/PACK) ×1 IMPLANT
TUBE KAMVAC SUCTION (TUBING) IMPLANT
TUBE SUCTION HIGH CAP CLEAR NV (SUCTIONS) ×1 IMPLANT
WATER STERILE IRR 1000ML POUR (IV SOLUTION) ×1 IMPLANT
WRAP KNEE MAXI GEL POST OP (GAUZE/BANDAGES/DRESSINGS) IMPLANT

## 2022-05-12 NOTE — Transfer of Care (Signed)
Immediate Anesthesia Transfer of Care Note  Patient: [name]  Procedure(s) Performed: Right knee polyethylene versus total knee arthroplasty revision (Right: Knee)  Patient Location: PACU  Anesthesia Type:General  Level of Consciousness: drowsy  Airway & Oxygen Therapy: Patient Spontanous Breathing and Patient connected to nasal cannula oxygen  Post-op Assessment: Report given to RN and Post -op Vital signs reviewed and stable  Post vital signs: Reviewed and stable  Last Vitals:  Vitals Value Taken Time  BP 148/81 05/12/22 1247  Temp    Pulse 82 05/12/22 1250  Resp 14 05/12/22 1250  SpO2 98 % 05/12/22 1250  Vitals shown include unvalidated device data.  Last Pain:  Vitals:   05/12/22 1050  TempSrc:   PainSc: 0-No pain      Patients Stated Pain Goal: 3 (05/12/22 1040)  Complications: No notable events documented.

## 2022-05-12 NOTE — Anesthesia Postprocedure Evaluation (Signed)
Anesthesia Post Note  Patient: [name]  Procedure(s) Performed: Right knee polyethylene versus total knee arthroplasty revision (Right: Knee)     Patient location during evaluation: PACU Anesthesia Type: General Level of consciousness: awake and alert Pain management: pain level controlled Vital Signs Assessment: post-procedure vital signs reviewed and stable Respiratory status: spontaneous breathing, nonlabored ventilation, respiratory function stable and patient connected to nasal cannula oxygen Cardiovascular status: blood pressure returned to baseline and stable Postop Assessment: no apparent nausea or vomiting Anesthetic complications: no   No notable events documented.  Last Vitals:  Vitals:   05/12/22 1345 05/12/22 1410  BP: (!) 162/95 (!) 154/90  Pulse: 83 79  Resp: 14 14  Temp:  36.4 C  SpO2: 98% 95%    Last Pain:  Vitals:   05/12/22 1447  TempSrc:   PainSc: Asleep                 Collene Schlichter

## 2022-05-12 NOTE — Interval H&P Note (Signed)
History and Physical Interval Note:  05/12/2022 9:02 AM  [name]  has presented today for surgery, with the diagnosis of unstable right total knee arthroplasty.  The various methods of treatment have been discussed with the patient and family. After consideration of risks, benefits and other options for treatment, the patient has consented to  Procedure(s): Right knee polyethylene versus total knee arthroplasty revision (Right) as a surgical intervention.  The patient's history has been reviewed, patient examined, no change in status, stable for surgery.  I have reviewed the patient's chart and labs.  Questions were answered to the patient's satisfaction.     Homero Fellers Shakita Keir

## 2022-05-12 NOTE — Evaluation (Signed)
Physical Therapy Evaluation Patient Details Name: [name] MRN: [id number] DOB: 1946/03/16 Today's Date: 05/12/2022  History of Present Illness  Pt is a 76yo female presenting s/p R-TKR on 05/12/22. 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   Clinical Impression  [name] is a 76 y.o. female POD 0 s/p R-total knee revision. Patient reports independence with mobility at baseline. Patient is now limited by functional impairments (see PT problem list below) and requires min guard for bed mobility and for transfers. Patient was able to ambulate 30 feet with RW and min guard level of assist. Patient instructed in exercise to facilitate ROM and circulation to manage edema. Provided incentive spirometer and with Vcs pt able to achieve . Patient will benefit from continued skilled PT interventions to address impairments and progress towards PLOF. Acute PT will follow to progress mobility and HEP in preparation for safe discharge home.       Recommendations for follow up therapy are one component of a multi-disciplinary discharge planning process, led by the attending physician.  Recommendations may be updated based on patient status, additional functional criteria and insurance authorization.  Follow Up Recommendations Follow physician's recommendations for discharge plan and follow up therapies      Assistance Recommended at Discharge Set up Supervision/Assistance  Patient can return home with the following  A little help with walking and/or transfers;A little help with bathing/dressing/bathroom;Assistance with cooking/housework;Assist for transportation;Help with stairs or ramp for entrance    Equipment Recommendations None recommended by PT  Recommendations for Other Services       Functional Status Assessment Patient has had a recent decline  in their functional status and demonstrates the ability to make significant improvements in function in a reasonable and predictable amount of time.     Precautions / Restrictions Precautions Precautions: Fall Restrictions Weight Bearing Restrictions: No Other Position/Activity Restrictions: wbat      Mobility  Bed Mobility Overal bed mobility: Needs Assistance Bed Mobility: Supine to Sit     Supine to sit: Min guard     General bed mobility comments: For safety only, no physical assist required.    Transfers Overall transfer level: Needs assistance Equipment used: Rolling walker (2 wheels) Transfers: Sit to/from Stand Sit to Stand: Min guard, From elevated surface           General transfer comment: For safety only, no physical assist required.    Ambulation/Gait Ambulation/Gait assistance: Min guard, +2 safety/equipment Gait Distance (Feet): 30 Feet Assistive device: Rolling walker (2 wheels) Gait Pattern/deviations: Step-to pattern Gait velocity: decreased     General Gait Details: Pt ambulated with RW and min guard, no physical assist required or overt LOB noted, +2 for recliner follow. R knee demonstrated good motor control with no evidence of buckling.  Stairs            Wheelchair Mobility    Modified Rankin (Stroke Patients Only)       Balance Overall balance assessment: Needs assistance Sitting-balance support: Feet supported, No upper extremity supported Sitting balance-Leahy Scale: Good     Standing balance support: Reliant on assistive device for balance, During functional activity, Bilateral upper extremity supported Standing balance-Leahy Scale: Poor                               Pertinent Vitals/Pain Pain Assessment Pain Assessment: 0-10  Pain Score: 2  Pain Location: right knee, back surgery Pain Descriptors / Indicators: Operative site guarding Pain Intervention(s): Limited activity within patient's tolerance,  Monitored during session, Repositioned, Ice applied    Home Living Family/patient expects to be discharged to:: Private residence Living Arrangements: Spouse/significant other Available Help at Discharge: Family;Available 24 hours/day Type of Home: House Home Access: Level entry       Home Layout: One level Home Equipment: Shower seat - built in;Shower seat;Grab bars - Patent attorney (2 wheels)      Prior Function Prior Level of Function : Independent/Modified Independent;Driving;History of Falls (last six months) (Falls: R leg would give way)             Mobility Comments: IND ADLs Comments: IND     Hand Dominance        Extremity/Trunk Assessment   Upper Extremity Assessment Upper Extremity Assessment: Overall WFL for tasks assessed    Lower Extremity Assessment Lower Extremity Assessment: RLE deficits/detail;LLE deficits/detail RLE Deficits / Details: MMT ank DF/PF 5/5, no extensor lag noted RLE Sensation: WNL LLE Deficits / Details: MMT ank DF/PF 5/5 LLE Sensation: WNL    Cervical / Trunk Assessment Cervical / Trunk Assessment: Back Surgery  Communication   Communication: No difficulties  Cognition Arousal/Alertness: Awake/alert Behavior During Therapy: WFL for tasks assessed/performed Overall Cognitive Status: Within Functional Limits for tasks assessed                                          General Comments      Exercises Total Joint Exercises Ankle Circles/Pumps: AROM, Both, 10 reps   Assessment/Plan    PT Assessment Patient needs continued PT services  PT Problem List Decreased strength;Decreased range of motion;Decreased activity tolerance;Decreased balance;Decreased mobility;Decreased coordination;Pain       PT Treatment Interventions Patient/family education;Neuromuscular re-education;Balance training;Therapeutic exercise;Therapeutic activities;Functional mobility training;Stair training;Gait  training;DME instruction    PT Goals (Current goals can be found in the Care Plan section)  Acute Rehab PT Goals Patient Stated Goal: Reduce pain and falling PT Goal Formulation: With patient Time For Goal Achievement: 05/19/22 Potential to Achieve Goals: Good    Frequency 7X/week     Co-evaluation               AM-PAC PT "6 Clicks" Mobility  Outcome Measure Help needed turning from your back to your side while in a flat bed without using bedrails?: None Help needed moving from lying on your back to sitting on the side of a flat bed without using bedrails?: A Little Help needed moving to and from a bed to a chair (including a wheelchair)?: A Little Help needed standing up from a chair using your arms (e.g., wheelchair or bedside chair)?: A Little Help needed to walk in hospital room?: A Little Help needed climbing 3-5 steps with a railing? : A Little 6 Click Score: 19    End of Session Equipment Utilized During Treatment: Gait belt Activity Tolerance: Patient tolerated treatment well;No increased pain Patient left: in chair;with call bell/phone within reach;with chair alarm set;with SCD's reapplied Nurse Communication: Mobility status PT Visit Diagnosis: Pain;Difficulty in walking, not elsewhere classified (R26.2) Pain - Right/Left: Right Pain - part of body: Knee    Time: 3662-9476 PT Time Calculation (min) (ACUTE ONLY): 16 min   Charges:   PT Evaluation $PT Eval Low Complexity: 1 Low  Jamesetta Geralds, PT, DPT [facility] Office: [phone] Pager: [phone]  Jamesetta Geralds 05/12/2022, 5:59 PM

## 2022-05-12 NOTE — Anesthesia Procedure Notes (Signed)
Procedure Name: Intubation Date/Time: 05/12/2022 11:22 AM  Performed by: Ezekiel Ina, CRNAPre-anesthesia Checklist: Patient identified, Emergency Drugs available, Suction available and Patient being monitored Patient Re-evaluated:Patient Re-evaluated prior to induction Oxygen Delivery Method: Circle system utilized Preoxygenation: Pre-oxygenation with 100% oxygen Induction Type: IV induction Ventilation: Mask ventilation without difficulty Laryngoscope Size: Miller and 2 Grade View: Grade I Tube type: Oral Tube size: 7.0 mm Number of attempts: 1 Airway Equipment and Method: Stylet Placement Confirmation: ETT inserted through vocal cords under direct vision, positive ETCO2 and breath sounds checked- equal and bilateral Secured at: 21 cm Tube secured with: Tape Dental Injury: Teeth and Oropharynx as per pre-operative assessment

## 2022-05-12 NOTE — Anesthesia Procedure Notes (Signed)
Anesthesia Regional Block: Adductor canal block   Pre-Anesthetic Checklist: , timeout performed,  Correct Patient, Correct Site, Correct Laterality,  Correct Procedure, Correct Position, site marked,  Risks and benefits discussed,  Surgical consent,  Pre-op evaluation,  At surgeon's request and post-op pain management  Laterality: Right  Prep: chloraprep       Needles:  Injection technique: Single-shot  Needle Type: Echogenic Needle     Needle Length: 9cm  Needle Gauge: 21     Additional Needles:   Procedures:,,,, ultrasound used (permanent image in chart),,    Narrative:  Start time: 05/12/2022 10:41 AM End time: 05/12/2022 10:47 AM Injection made incrementally with aspirations every 5 mL.  Performed by: Personally  Anesthesiologist: Collene Schlichter, MD  Additional Notes: No pain on injection. No increased resistance to injection. Injection made in 5cc increments.  Good needle visualization.  Patient tolerated procedure well.

## 2022-05-12 NOTE — Brief Op Note (Signed)
05/12/2022  12:25 PM  PATIENT:  [name]  76 y.o. female  PRE-OPERATIVE DIAGNOSIS:  unstable right total knee arthroplasty  POST-OPERATIVE DIAGNOSIS:  unstable right total knee arthroplasty  PROCEDURE:  Procedure(s): Right knee polyethylene versus total knee arthroplasty revision (Right)  SURGEON:  Surgeon(s) and Role:    Ollen Gross, MD - Primary  PHYSICIAN ASSISTANT:   ASSISTANTS: Arther Abbott, PA-C   ANESTHESIA:   regional and spinal  EBL:  25 ml  BLOOD ADMINISTERED:none  DRAINS: none   LOCAL MEDICATIONS USED:  OTHER Exparel  COUNTS:  YES  TOURNIQUET:   Total Tourniquet Time Documented: Thigh (Right) - 18 minutes Total: Thigh (Right) - 18 minutes   DICTATION: . [id number]  PLAN OF CARE: Admit to inpatient   PATIENT DISPOSITION:  PACU - hemodynamically stable.

## 2022-05-12 NOTE — Plan of Care (Signed)
  Problem: Education: Goal: Knowledge of the prescribed therapeutic regimen will improve Outcome: Progressing   Problem: Pain Management: Goal: Pain level will decrease with appropriate interventions Outcome: Progressing   Problem: Nutrition: Goal: Adequate nutrition will be maintained Outcome: Progressing   

## 2022-05-12 NOTE — Anesthesia Preprocedure Evaluation (Addendum)
Anesthesia Evaluation  Patient identified by MRN, date of birth, ID band Patient awake    Reviewed: Allergy & Precautions, NPO status , Patient's Chart, lab work & pertinent test results  History of Anesthesia Complications (+) PONV and history of anesthetic complications  Airway Mallampati: II  TM Distance: >3 FB Neck ROM: Full    Dental  (+) Teeth Intact, Dental Advisory Given Upper and lower permanent plates :   Pulmonary neg pulmonary ROS,    Pulmonary exam normal breath sounds clear to auscultation       Cardiovascular hypertension, Pt. on medications Normal cardiovascular exam Rhythm:Regular Rate:Normal     Neuro/Psych Multiple back surgeries  negative neurological ROS     GI/Hepatic negative GI ROS, Neg liver ROS,   Endo/Other  Hypothyroidism   Renal/GU negative Renal ROS     Musculoskeletal  (+) Arthritis , Fibromyalgia -Polymyalgia rheumatica Unstable right total knee arthroplasty   Abdominal   Peds  Hematology negative hematology ROS (+) Plt 241k   Anesthesia Other Findings Day of surgery medications reviewed with the patient.  Reproductive/Obstetrics                           Anesthesia Physical Anesthesia Plan  ASA: 2  Anesthesia Plan: General   Post-op Pain Management: Regional block*, Ofirmev IV (intra-op)* and Toradol IV (intra-op)*   Induction: Intravenous  PONV Risk Score and Plan: 4 or greater and Dexamethasone, Ondansetron and Propofol infusion  Airway Management Planned: Oral ETT  Additional Equipment:   Intra-op Plan:   Post-operative Plan: Extubation in OR  Informed Consent: I have reviewed the patients History and Physical, chart, labs and discussed the procedure including the risks, benefits and alternatives for the proposed anesthesia with the patient or authorized representative who has indicated his/her understanding and acceptance.     Dental  advisory given  Plan Discussed with: CRNA  Anesthesia Plan Comments:        Anesthesia Quick Evaluation

## 2022-05-12 NOTE — Discharge Instructions (Signed)
 Frank Aluisio, MD Total Joint Specialist [facility] [address] [place], [place] [address] [phone] POSTOPERATIVE DIRECTIONS  Knee Rehabilitation, Guidelines Following Surgery  Results after knee surgery are often greatly improved when you follow the exercise, range of motion and muscle strengthening exercises prescribed by your doctor. Safety measures are also important to protect the knee from further injury. If any of these exercises cause you to have increased pain or swelling in your knee joint, decrease the amount until you are comfortable again and slowly increase them. If you have problems or questions, call your caregiver or physical therapist for advice.   BLOOD CLOT PREVENTION Take a 325 mg Aspirin two times a day for three weeks following surgery. Then take an 81 mg Aspirin once a day for three weeks. Then discontinue Aspirin. You may resume your vitamins/supplements upon discharge from the hospital. Do not take any NSAIDs (Advil, Aleve, Ibuprofen, Meloxicam, etc.) until you have discontinued the 325 mg Aspirin.  HOME CARE INSTRUCTIONS  Remove items at home which could result in a fall. This includes throw rugs or furniture in walking pathways.  ICE to the affected knee as much as tolerated. Icing helps control swelling. If the swelling is well controlled you will be more comfortable and rehab easier. Continue to use ice on the knee for pain and swelling from surgery. You may notice swelling that will progress down to the foot and ankle. This is normal after surgery. Elevate the leg when you are not up walking on it.    Continue to use the breathing machine which will help keep your temperature down. It is common for your temperature to cycle up and down following surgery, especially at night when you are not up moving around and exerting yourself. The breathing machine keeps your lungs expanded and your temperature down. Do not place pillow  under the operative knee, focus on keeping the knee straight while resting  DIET You may resume your previous home diet once you are discharged from the hospital.  DRESSING / WOUND CARE / SHOWERING Keep your bulky bandage on for 2 days. On the third post-operative day you may remove the Ace bandage and gauze. There is a waterproof adhesive bandage on your skin which will stay in place until your first follow-up appointment. Once you remove this you will not need to place another bandage You may begin showering 3 days following surgery, but do not submerge the incision under water.  ACTIVITY For the first 5 days, the key is rest and control of pain and swelling Do your home exercises twice a day starting on post-operative day 3. On the days you go to physical therapy, just do the home exercises once that day. You should rest, ice and elevate the leg for 50 minutes out of every hour. Get up and walk/stretch for 10 minutes per hour. After 5 days you can increase your activity slowly as tolerated. Walk with your walker as instructed. Use the walker until you are comfortable transitioning to a cane. Walk with the cane in the opposite hand of the operative leg. You may discontinue the cane once you are comfortable and walking steadily. Avoid periods of inactivity such as sitting longer than an hour when not asleep. This helps prevent blood clots.  You may discontinue the knee immobilizer once you are able to perform a straight leg raise while lying down. You may resume a sexual relationship in one month or when given the OK by   your doctor.  You may return to work once you are cleared by your doctor.  Do not drive a car for 6 weeks or until released by your surgeon.  Do not drive while taking narcotics.  TED HOSE STOCKINGS Wear the elastic stockings on both legs for three weeks following surgery during the day. You may remove them at night for sleeping.  WEIGHT BEARING Weight bearing as tolerated  with assist device (walker, cane, etc) as directed, use it as long as suggested by your surgeon or therapist, typically at least 4-6 weeks.  POSTOPERATIVE CONSTIPATION PROTOCOL Constipation - defined medically as fewer than three stools per week and severe constipation as less than one stool per week.  One of the most common issues patients have following surgery is constipation.  Even if you have a regular bowel pattern at home, your normal regimen is likely to be disrupted due to multiple reasons following surgery.  Combination of anesthesia, postoperative narcotics, change in appetite and fluid intake all can affect your bowels.  In order to avoid complications following surgery, here are some recommendations in order to help you during your recovery period.  Colace (docusate) - Pick up an over-the-counter form of Colace or another stool softener and take twice a day as long as you are requiring postoperative pain medications.  Take with a full glass of water daily.  If you experience loose stools or diarrhea, hold the colace until you stool forms back up. If your symptoms do not get better within 1 week or if they get worse, check with your doctor. Dulcolax (bisacodyl) - Pick up over-the-counter and take as directed by the product packaging as needed to assist with the movement of your bowels.  Take with a full glass of water.  Use this product as needed if not relieved by Colace only.  MiraLax (polyethylene glycol) - Pick up over-the-counter to have on hand. MiraLax is a solution that will increase the amount of water in your bowels to assist with bowel movements.  Take as directed and can mix with a glass of water, juice, soda, coffee, or tea. Take if you go more than two days without a movement. Do not use MiraLax more than once per day. Call your doctor if you are still constipated or irregular after using this medication for 7 days in a row.  If you continue to have problems with postoperative  constipation, please contact the office for further assistance and recommendations.  If you experience "the worst abdominal pain ever" or develop nausea or vomiting, please contact the office immediatly for further recommendations for treatment.  ITCHING If you experience itching with your medications, try taking only a single pain pill, or even half a pain pill at a time.  You can also use Benadryl over the counter for itching or also to help with sleep.   MEDICATIONS See your medication summary on the "After Visit Summary" that the nursing staff will review with you prior to discharge.  You may have some home medications which will be placed on hold until you complete the course of blood thinner medication.  It is important for you to complete the blood thinner medication as prescribed by your surgeon.  Continue your approved medications as instructed at time of discharge.  PRECAUTIONS If you experience chest pain or shortness of breath - call 911 immediately for transfer to the hospital emergency department.  If you develop a fever greater that 101 F, purulent drainage from wound, increased redness   or drainage from wound, foul odor from the wound/dressing, or calf pain - CONTACT YOUR SURGEON.                                                   FOLLOW-UP APPOINTMENTS Make sure you keep all of your appointments after your operation with your surgeon and caregivers. You should call the office at the above phone number and make an appointment for approximately two weeks after the date of your surgery or on the date instructed by your surgeon outlined in the "After Visit Summary".  RANGE OF MOTION AND STRENGTHENING EXERCISES  Rehabilitation of the knee is important following a knee injury or an operation. After just a few days of immobilization, the muscles of the thigh which control the knee become weakened and shrink (atrophy). Knee exercises are designed to build up the tone and strength of the thigh  muscles and to improve knee motion. Often times heat used for twenty to thirty minutes before working out will loosen up your tissues and help with improving the range of motion but do not use heat for the first two weeks following surgery. These exercises can be done on a training (exercise) mat, on the floor, on a table or on a bed. Use what ever works the best and is most comfortable for you Knee exercises include:  Leg Lifts - While your knee is still immobilized in a splint or cast, you can do straight leg raises. Lift the leg to 60 degrees, hold for 3 sec, and slowly lower the leg. Repeat 10-20 times 2-3 times daily. Perform this exercise against resistance later as your knee gets better.  Quad and Hamstring Sets - Tighten up the muscle on the front of the thigh (Quad) and hold for 5-10 sec. Repeat this 10-20 times hourly. Hamstring sets are done by pushing the foot backward against an object and holding for 5-10 sec. Repeat as with quad sets.  Leg Slides: Lying on your back, slowly slide your foot toward your buttocks, bending your knee up off the floor (only go as far as is comfortable). Then slowly slide your foot back down until your leg is flat on the floor again. Angel Wings: Lying on your back spread your legs to the side as far apart as you can without causing discomfort.  A rehabilitation program following serious knee injuries can speed recovery and prevent re-injury in the future due to weakened muscles. Contact your doctor or a physical therapist for more information on knee rehabilitation.   POST-OPERATIVE OPIOID TAPER INSTRUCTIONS: It is important to wean off of your opioid medication as soon as possible. If you do not need pain medication after your surgery it is ok to stop day one. Opioids include: Codeine, Hydrocodone(Norco, Vicodin), Oxycodone(Percocet, oxycontin) and hydromorphone amongst others.  Long term and even short term use of opiods can cause: Increased pain  response Dependence Constipation Depression Respiratory depression And more.  Withdrawal symptoms can include Flu like symptoms Nausea, vomiting And more Techniques to manage these symptoms Hydrate well Eat regular healthy meals Stay active Use relaxation techniques(deep breathing, meditating, yoga) Do Not substitute Alcohol to help with tapering If you have been on opioids for less than two weeks and do not have pain than it is ok to stop all together.  Plan to wean off of opioids This   plan should start within one week post op of your joint replacement. Maintain the same interval or time between taking each dose and first decrease the dose.  Cut the total daily intake of opioids by one tablet each day Next start to increase the time between doses. The last dose that should be eliminated is the evening dose.   IF YOU ARE TRANSFERRED TO A SKILLED REHAB FACILITY If the patient is transferred to a skilled rehab facility following release from the hospital, a list of the current medications will be sent to the facility for the patient to continue.  When discharged from the skilled rehab facility, please have the facility set up the patient's Home Health Physical Therapy prior to being released. Also, the skilled facility will be responsible for providing the patient with their medications at time of release from the facility to include their pain medication, the muscle relaxants, and their blood thinner medication. If the patient is still at the rehab facility at time of the two week follow up appointment, the skilled rehab facility will also need to assist the patient in arranging follow up appointment in our office and any transportation needs.  MAKE SURE YOU:  Understand these instructions.  Get help right away if you are not doing well or get worse.   DENTAL ANTIBIOTICS:  In most cases prophylactic antibiotics for Dental procdeures after total joint surgery are not  necessary.  Exceptions are as follows:  1. History of prior total joint infection  2. Severely immunocompromised (Organ Transplant, cancer chemotherapy, Rheumatoid biologic meds such as Humera)  3. Poorly controlled diabetes (A1C &gt; 8.0, blood glucose over 200)  If you have one of these conditions, contact your surgeon for an antibiotic prescription, prior to your dental procedure.    Pick up stool softner and laxative for home use following surgery while on pain medications. Do not submerge incision under water. Please use good hand washing techniques while changing dressing each day. May shower starting three days after surgery. Please use a clean towel to pat the incision dry following showers. Continue to use ice for pain and swelling after surgery. Do not use any lotions or creams on the incision until instructed by your surgeon.  

## 2022-05-12 NOTE — Progress Notes (Signed)
Orthopedic Tech Progress Note Patient Details:  [name] 1946-07-04 [id number]  Patient ID: [name], female   DOB: 02-Feb-1946, 76 y.o.   MRN: [id number]  Kizzie Fantasia 05/12/2022, 1:04 PM Cpm applied to right leg in pacu

## 2022-05-12 NOTE — Op Note (Unsigned)
[name], [name] MEDICAL RECORD NO: [id number] ACCOUNT NO: [id number] DATE OF BIRTH: [birth year] FACILITY: [facility] LOCATION: [id number] PHYSICIAN: Gus Rankin. Ondre Salvetti, MD  Operative Report   DATE OF PROCEDURE: 05/12/2022  PREOPERATIVE DIAGNOSIS:  Unstable right total knee arthroplasty.  POSTOPERATIVE DIAGNOSIS:  Unstable right total knee arthroplasty.  PROCEDURE:  Right knee polyethylene revision.  SURGEON:  Gus Rankin. Iraida Cragin, MD  ASSISTANT:  Arther Abbott, PA-C.  ANESTHESIA:  Adductor canal block and spinal.  ESTIMATED BLOOD LOSS:  25 mL.  DRAINS:  None.  TOURNIQUET TIME:  18 minutes at 300 mmHg.  COMPLICATIONS:  None.  CONDITION:  Stable to recovery.  BRIEF CLINICAL NOTE:  The patient is a 76 year old female who had a right total knee arthroplasty performed a few years ago and has had persistent pain, instability and swelling and she has had an infection workup, which was negative.  She had a bone  scan, which was negative for loosening.  She had some improvement temporarily with a brace.  She presents now for polyethylene versus total knee revision.  DESCRIPTION OF PROCEDURE:  After successful administration of adductor canal block and spinal anesthetic, a tourniquet was placed on her right thigh and right lower extremity prepped and draped in the usual sterile fashion.  Exam under anesthesia was  performed showing significant varus or valgus laxity in extension and significant AP laxity in flexion. The leg was wrapped in Esmarch and then the tourniquet inflated to 300 mmHg.  Midline incision was made with a 10 blade through the subcutaneous  tissue to the level of the extensor mechanism.  Fresh blade was used to make a medial parapatellar arthrotomy.  Soft tissue on the proximal medial tibia is subperiosteally elevated to the joint line with a knife and into the semimembranosus bursa with a  Cobb elevator.  Soft tissue laterally was elevated with attention being paid to  avoiding the patellar tendon on tibial tubercle.  Patella was everted, knee flexed 90 degrees and I then removed the tibial polyethylene.  This was a size 7, 6 mm thick  polyethylene.  We then inspected the interface between the tibial and femoral components and bone.  The components were very stable in excellent position and well fixed.  We thus trial thicker polyethylene to see if that would eliminate the instability.  With an 8 mm  thick to still a small amount of varus, valgus placed and with 10 mm.  With a 10 mm thick trial full extension was achieved with excellent varus, valgus, and anterior, posterior balance throughout full range of motion.  I felt that this would be the most  appropriate polyethylene to solve the problem.  The trials were removed and the permanent size 7, 10 mm thick Attune rotating platform posterior-stabilized insert is placed.  The knee was reduced with outstanding stability throughout full range of  motion.  20 mL of Exparel mixed with 60 mL of saline was then injected into the extensor mechanism, periosteum of the femur and subcu.  The wound was copiously irrigated with saline solution.  The arthrotomy was then closed with a running 0 Stratafix  suture.  The flexion against gravity was about 125 degrees.  Tourniquet was released, total time 18 minutes.  Subcutaneous was closed with interrupted 2-0 Vicryl and subcuticular running 4-0 Monocryl.  Incisions cleaned and dried and Steri-Strips and a  sterile dressing applied.  She was then awakened and transported to recovery in stable condition.  Note that a surgical assistant was of medical  necessity for this procedure to do it in a safe and expeditious manner.  Surgical assistant was necessary for retraction of vital ligaments and neurovascular structures and for proper positioning of the limb  for safe removal of the old implant and safe and accurate placement of the new implant.     SUJ D: 05/12/2022 12:30:49 pm T:  05/12/2022 11:18:00 pm  JOB: [id number]/ [id number]

## 2022-05-13 ENCOUNTER — Encounter (HOSPITAL_COMMUNITY): Payer: Self-pay | Admitting: Orthopedic Surgery

## 2022-05-13 LAB — CBC
HCT: 35.4 % — ABNORMAL LOW (ref 36.0–46.0)
Hemoglobin: 10.9 g/dL — ABNORMAL LOW (ref 12.0–15.0)
MCH: 27.9 pg (ref 26.0–34.0)
MCHC: 30.8 g/dL (ref 30.0–36.0)
MCV: 90.5 fL (ref 80.0–100.0)
Platelets: 403 10*3/uL — ABNORMAL HIGH (ref 150–400)
RBC: 3.91 MIL/uL (ref 3.87–5.11)
RDW: 14.1 % (ref 11.5–15.5)
WBC: 13.8 10*3/uL — ABNORMAL HIGH (ref 4.0–10.5)
nRBC: 0 % (ref 0.0–0.2)

## 2022-05-13 LAB — BASIC METABOLIC PANEL
Anion gap: 5 (ref 5–15)
BUN: 13 mg/dL (ref 8–23)
CO2: 24 mmol/L (ref 22–32)
Calcium: 8.7 mg/dL — ABNORMAL LOW (ref 8.9–10.3)
Chloride: 112 mmol/L — ABNORMAL HIGH (ref 98–111)
Creatinine, Ser: 0.67 mg/dL (ref 0.44–1.00)
GFR, Estimated: >60 mL/min (ref >60)
Glucose, Bld: 171 mg/dL — ABNORMAL HIGH (ref 70–99)
Potassium: 3.9 mmol/L (ref 3.5–5.1)
Sodium: 141 mmol/L (ref 135–145)

## 2022-05-13 MED ORDER — METHOCARBAMOL 500 MG PO TABS: 500.0000 mg | ORAL_TABLET | Freq: Four times a day (QID) | ORAL | 0 refills | Status: DC | PRN

## 2022-05-13 MED ORDER — ASPIRIN 325 MG PO TBEC: 325.0000 mg | DELAYED_RELEASE_TABLET | Freq: Two times a day (BID) | ORAL | 0 refills | Status: AC

## 2022-05-13 MED ORDER — OXYCODONE HCL 5 MG PO TABS: 5.0000 mg | ORAL_TABLET | Freq: Four times a day (QID) | ORAL | 0 refills | Status: DC | PRN

## 2022-05-13 MED ORDER — TRAMADOL HCL 50 MG PO TABS: 50.0000 mg | ORAL_TABLET | Freq: Four times a day (QID) | ORAL | 0 refills | Status: DC | PRN

## 2022-05-13 NOTE — TOC Initial Note (Addendum)
Transition of Care [facility]) - Initial/Assessment Note    Patient Details  Name: [name] MRN: [id number] Date of Birth: Apr 01, 1946  [facility] CM/SW Contact:    Lennart Pall, LCSW Phone Number: 05/13/2022, 9:47 AM  Clinical Narrative:                 Met with pt and confirming she has all needed DME at home.  OPPT already set up with [facility].  No TOC needs.    Barriers to Discharge: No Barriers Identified   Patient Goals and CMS Choice Patient states their goals for this hospitalization and ongoing recovery are:: return home      Expected Discharge Plan and Services           Expected Discharge Date: 05/13/22               DME Arranged: N/A DME Agency: NA                  Prior Living Arrangements/Services                       Activities of Daily Living Home Assistive Devices/Equipment: Bedside commode/3-in-1, Walker (specify type) ADL Screening (condition at time of admission) Patient's cognitive ability adequate to safely complete daily activities?: Yes Is the patient deaf or have difficulty hearing?: No Does the patient have difficulty seeing, even when wearing glasses/contacts?: No Does the patient have difficulty concentrating, remembering, or making decisions?: No Patient able to express need for assistance with ADLs?: Yes Does the patient have difficulty dressing or bathing?: No Independently performs ADLs?: Yes (appropriate for developmental age) Does the patient have difficulty walking or climbing stairs?: No Weakness of Legs: Right Weakness of Arms/Hands: None  Permission Sought/Granted                  Emotional Assessment              Admission diagnosis:  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] Patient Active Problem List   Diagnosis Date Noted   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   Precordial chest pain 12/31/2021    Primary localized osteoarthritis of right knee 10/19/2018   Right foot pain 10/19/2018   Incontinence of feces 09/21/2018   Vulvar lesion 07/20/2018   History of repair of right rotator cuff 03/03/2017   Primary osteoarthritis of both hands/ has had bilateral [place] surgery  03/03/2017   History of hypertension 03/03/2017   History of hyperlipidemia 03/03/2017   DDD (degenerative disc disease), lumbar/ history of lumbar surgery  03/03/2017   Osteopenia of multiple sites 03/03/2017   Chronic left shoulder pain 01/20/2017   Primary osteoarthritis of both knees 01/20/2017   Trapezius muscle spasm 01/20/2017   Insomnia 01/20/2017   Fatigue 01/20/2017   Fibromyalgia 01/20/2017   High risk medications (not anticoagulants) long-term use 01/20/2017   PMR (polymyalgia rheumatica) ([place]) 01/20/2017   Left ovarian cyst 06/02/2015   Degenerative joint disease, shoulder, right 08/02/2013   Precordial pain 02/11/2012   HTN (hypertension) 02/11/2012   Hyperlipidemia 02/11/2012   PCP:  Leonard Downing, MD Pharmacy:   [id number]Phone: [phone]: [phone] [facility] [facility] [facility] [place]: [phone]  Fax: [facility] ([facility]) - [place], [address] [place] [address] Phone: [phone] Fax: [phone]     Social Determinants of Health (SDOH) Interventions    Readmission Risk Interventions    05/13/2022    9:45 AM  Readmission Risk Prevention Plan  Post Dischage Appt Complete  Medication Screening Complete  Transportation Screening Complete

## 2022-05-13 NOTE — Plan of Care (Signed)
Pt ready for DC home 

## 2022-05-13 NOTE — Progress Notes (Signed)
Physical Therapy Treatment Patient Details Name: [name] MRN: [id number] DOB: October 08, 1945 Today's Date: 05/13/2022   History of Present Illness Pt is a 76yo female presenting s/p R Knee Polyethylene Revision on 05/12/22.  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    PT Comments    POD # 1 am session Right knee polyethylene revision Pt AxO x 3 very pleasant and eager to D/C to home.  Pt was already OOB in recliner "feeling good".  Assisted with amb in hallway Then returned to room to perform some TE's following HEP handout.  Instructed on proper tech, freq as well as use of ICE.   Addressed all mobility questions, discussed appropriate activity, educated on use of ICE.  Pt ready for D/C to home after ONE PT session.   Recommendations for follow up therapy are one component of a multi-disciplinary discharge planning process, led by the attending physician.  Recommendations may be updated based on patient status, additional functional criteria and insurance authorization.  Follow Up Recommendations  Follow physician's recommendations for discharge plan and follow up therapies     Assistance Recommended at Discharge Set up Supervision/Assistance  Patient can return home with the following A little help with walking and/or transfers;A little help with bathing/dressing/bathroom;Assistance with cooking/housework;Assist for transportation;Help with stairs or ramp for entrance   Equipment Recommendations  None recommended by PT    Recommendations for Other Services       Precautions / Restrictions Precautions Precautions: Fall Precaution Comments: no pillow under knee Restrictions Weight Bearing Restrictions: No Other Position/Activity Restrictions: WBAT     Mobility  Bed Mobility               General bed mobility comments: OOB in  recliner    Transfers Overall transfer level: Needs assistance Equipment used: Rolling walker (2 wheels) Transfers: Sit to/from Stand Sit to Stand: Supervision           General transfer comment: good use of hands to steady self and good safety cognition.    Ambulation/Gait Ambulation/Gait assistance: Supervision Gait Distance (Feet): 115 Feet Assistive device: Rolling walker (2 wheels) Gait Pattern/deviations: Step-through pattern Gait velocity: decreased     General Gait Details: amb a functional distance 115 feet with one VC on safety with turns.  Good alternating gait with only 3/10 pain.   Stairs Stairs:  (no stairs to enter home)           Wheelchair Mobility    Modified Rankin (Stroke Patients Only)       Balance                                            Cognition Arousal/Alertness: Awake/alert Behavior During Therapy: WFL for tasks assessed/performed Overall Cognitive Status: Within Functional Limits for tasks assessed                                 General Comments: AxO x 3 very pleasant        Exercises  TE's following HEP handout 10 reps B LE ankle pumps 05 reps towel squeezes 05 reps knee presses 05 reps heel slides  05 reps SAQ's 05 reps SLR's 05 reps ABD Educated on use of gait belt to  assist with TE's Followed by ICE     General Comments        Pertinent Vitals/Pain Pain Assessment Pain Assessment: 0-10 Pain Score: 3  Pain Location: Right knee Pain Descriptors / Indicators: Operative site guarding, Aching, Sore Pain Intervention(s): Monitored during session, Premedicated before session, Repositioned, Ice applied    Home Living                          Prior Function            PT Goals (current goals can now be found in the care plan section) Progress towards PT goals: Progressing toward goals    Frequency    7X/week      PT Plan Current plan remains  appropriate    Co-evaluation              AM-PAC PT "6 Clicks" Mobility   Outcome Measure  Help needed turning from your back to your side while in a flat bed without using bedrails?: None Help needed moving from lying on your back to sitting on the side of a flat bed without using bedrails?: None Help needed moving to and from a bed to a chair (including a wheelchair)?: None Help needed standing up from a chair using your arms (e.g., wheelchair or bedside chair)?: None Help needed to walk in hospital room?: A Little Help needed climbing 3-5 steps with a railing? : A Little 6 Click Score: 22    End of Session Equipment Utilized During Treatment: Gait belt Activity Tolerance: Patient tolerated treatment well;No increased pain Patient left: in chair;with call bell/phone within reach;with chair alarm set;with SCD's reapplied Nurse Communication: Mobility status (ready for D/C after ONE PT session) PT Visit Diagnosis: Pain;Difficulty in walking, not elsewhere classified (R26.2) Pain - Right/Left: Right Pain - part of body: Knee     Time: 2725-3664 PT Time Calculation (min) (ACUTE ONLY): 25 min  Charges:  $Gait Training: 8-22 mins $Therapeutic Exercise: 8-22 mins                     {Ladarien Beeks  PTA Acute  Rehabilitation [employer] M-F          [phone] Weekend pager [phone]

## 2022-05-13 NOTE — Progress Notes (Signed)
   Subjective: 1 Day Post-Op Procedure(s) (LRB): Right knee polyethylene versus total knee arthroplasty revision (Right) Patient reports pain as mild.   Patient seen in rounds by Dr. Lequita Halt. Patient is well, and has had no acute complaints or problems. Denies chest pain or SOB. No issues overnight. Voiding without difficulty. We will continue therapy today, ambulated 30' yesterday.  Objective: Vital signs in last 24 hours: Temp:  [97.6 F (36.4 C)-98.3 F (36.8 C)] 98.2 F (36.8 C) (08/31 0526) Pulse Rate:  [70-88] 71 (08/31 0526) Resp:  [12-18] 18 (08/31 0526) BP: (122-166)/(78-101) 137/87 (08/31 0526) SpO2:  [92 %-100 %] 98 % (08/31 0526) Weight:  [80.3 kg] 80.3 kg (08/30 0923)  Intake/Output from previous day:  Intake/Output Summary (Last 24 hours) at 05/13/2022 0724 Last data filed at 05/13/2022 0541 Gross per 24 hour  Intake 2214.08 ml  Output 1825 ml  Net 389.08 ml     Intake/Output this shift: No intake/output data recorded.  Labs: Recent Labs    05/13/22 0408  HGB 10.9*   Recent Labs    05/13/22 0408  WBC 13.8*  RBC 3.91  HCT 35.4*  PLT 403*   Recent Labs    05/13/22 0408  NA 141  K 3.9  CL 112*  CO2 24  BUN 13  CREATININE 0.67  GLUCOSE 171*  CALCIUM 8.7*   No results for input(s): "LABPT", "INR" in the last 72 hours.  Exam: General - Patient is Alert and Oriented Extremity - Neurologically intact Neurovascular intact Sensation intact distally Dorsiflexion/Plantar flexion intact Dressing - dressing C/D/I Motor Function - intact, moving foot and toes well on exam.   Past Medical History:  Diagnosis Date   Bilateral ovarian cysts 09/12/2014   bilateral simple ovarian cysts.  [place] due in January 2018.   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 x 20 yr.   Hyperlipidemia    Hypothyroid    IBS (irritable bowel syndrome)    Impingement  syndrome of right shoulder 07/2013   Polymyalgia rheumatica (HCC)    5-28  states she has been on prednisone and now is trying to only be on plaquenil to manage the polymyalgia    PONV (postoperative nausea and vomiting)    Primary localized osteoarthritis of right knee    Rupture of right rotator cuff 07/2013   VIN I (vulvar intraepithelial neoplasia I) 2017, 2018   this is from a perineal/vulvar biopsy.   Vitamin B12 deficiency     Assessment/Plan: 1 Day Post-Op Procedure(s) (LRB): Right knee polyethylene versus total knee arthroplasty revision (Right) Principal Problem:   Failed total knee arthroplasty (HCC) Active Problems:   Failed total right knee replacement (HCC)  Estimated body mass index is 26.91 kg/m as calculated from the following:   Height as of this encounter: 5\' 8"  (1.727 m).   Weight as of this encounter: 80.3 kg. Advance diet Up with therapy D/C IV fluids  DVT Prophylaxis - Aspirin Weight bearing as tolerated. Continue therapy.  Plan is to go Home after hospital stay. Plan for discharge after one session if meeting goals. Scheduled for OPPT at [facility] PT. Follow-up in the office in 2 weeks.  The PDMP database was reviewed today prior to any opioid medications being prescribed to this patient.  [facility], PA-C Orthopedic Surgery [phone] 05/13/2022, 7:24 AM

## 2022-05-19 NOTE — Discharge Summary (Signed)
Patient ID: [name] MRN: [id number] DOB/AGE: [birth year] 76 y.o.  Admit date: 05/12/2022 Discharge date: 05/13/2022  Admission Diagnoses:  Principal Problem:   Failed total knee arthroplasty [facility]) Active Problems:   Failed total right knee replacement [facility])   Discharge Diagnoses:  Same  Past Medical History:  Diagnosis Date   Bilateral ovarian cysts 09/12/2014   bilateral simple ovarian cysts.  [place] due in January 2018.   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 x 20 yr.   Hyperlipidemia    Hypothyroid    IBS (irritable bowel syndrome)    Impingement syndrome of right shoulder 07/2013   Polymyalgia rheumatica (HCC)    5-28  states she has been on prednisone and now is trying to only be on plaquenil to manage the polymyalgia    PONV (postoperative nausea and vomiting)    Primary localized osteoarthritis of right knee    Rupture of right rotator cuff 07/2013   VIN I (vulvar intraepithelial neoplasia I) 2017, 2018   this is from a perineal/vulvar biopsy.   Vitamin B12 deficiency     Surgeries: Procedure(s): Right knee polyethylene versus total knee arthroplasty revision on 05/12/2022   Consultants:   Discharged Condition: Improved  Hospital Course: [name] is an 76 y.o. female who was admitted 05/12/2022 for operative treatment ofFailed total knee arthroplasty (HCC). Patient has severe unremitting pain that affects sleep, daily activities, and work/hobbies. After pre-op clearance the patient was taken to the operating room on 05/12/2022 and underwent  Procedure(s): Right knee polyethylene versus total knee arthroplasty revision.    Patient was given perioperative antibiotics:  Anti-infectives (From admission, onward)    Start     Dose/Rate Route Frequency Ordered Stop   05/12/22 1730  ceFAZolin (ANCEF) IVPB 2g/100 mL premix        2 g 200 mL/hr over 30  Minutes Intravenous Every 6 hours 05/12/22 1412 05/13/22 0032   05/12/22 0900  ceFAZolin (ANCEF) IVPB 2g/100 mL premix        2 g 200 mL/hr over 30 Minutes Intravenous On call to O.R. 05/12/22 0849 05/12/22 1127        Patient was given sequential compression devices, early ambulation, and chemoprophylaxis to prevent DVT.  Patient benefited maximally from hospital stay and there were no complications.    Recent vital signs: No data found.   Recent laboratory studies: No results for input(s): "WBC", "HGB", "HCT", "PLT", "NA", "K", "CL", "CO2", "BUN", "CREATININE", "GLUCOSE", "INR", "CALCIUM" in the last 72 hours.  Invalid input(s): "PT", "2"   Discharge Medications:   Allergies as of 05/13/2022       Reactions   Penicillins Itching   OF MOUTH Did it involve swelling of the face/tongue/throat, SOB, or low BP? Unknown Did it involve sudden or severe rash/hives, skin peeling, or any reaction on the inside of your mouth or nose? Unknown Did you need to seek medical attention at a hospital or doctor's office? Unknown When did it last happen?     in my 39s If all above answers are "NO", may proceed with cephalosporin use. Tolerated Cephalosporin Date: 05/13/22.   Sulfa Antibiotics Itching   OF MOUTH        Medication List     STOP taking these medications    diclofenac Sodium 1 % Gel Commonly known as: VOLTAREN       TAKE these  medications    amLODipine 5 MG tablet Commonly known as: NORVASC Take 2.5 mg by mouth at bedtime as needed (High blood pressure).   aspirin EC 325 MG tablet Take 1 tablet (325 mg total) by mouth 2 (two) times daily for 20 days. Then take one 81 mg aspirin once a day for three weeks. Then discontinue aspirin.   benazepril 40 MG tablet Commonly known as: LOTENSIN Take 40 mg by mouth daily.   CALCIUM 600 + D PO Take 1 tablet by mouth daily.   cyanocobalamin 1000 MCG tablet Commonly known as: VITAMIN B12 Take 1,000 mcg by mouth daily.    levothyroxine 125 MCG tablet Commonly known as: SYNTHROID Take 125 mcg by mouth daily before breakfast.   Magnesium 250 MG Tabs Take 500 mg by mouth daily.   METAMUCIL FIBER PO Take 6 capsules by mouth daily.   methocarbamol 500 MG tablet Commonly known as: ROBAXIN Take 1 tablet (500 mg total) by mouth every 6 (six) hours as needed for muscle spasms.   multivitamin tablet Take 1 tablet by mouth daily.   oxyCODONE 5 MG immediate release tablet Commonly known as: Oxy IR/ROXICODONE Take 1-2 tablets (5-10 mg total) by mouth every 6 (six) hours as needed for severe pain.   traMADol 50 MG tablet Commonly known as: ULTRAM Take 1-2 tablets (50-100 mg total) by mouth every 6 (six) hours as needed for moderate pain.               Discharge Care Instructions  (From admission, onward)           Start     Ordered   05/13/22 0000  Weight bearing as tolerated        05/13/22 0726   05/13/22 0000  Change dressing       Comments: You may remove the bulky bandage (ACE wrap and gauze) two days after surgery. You will have an adhesive waterproof bandage underneath. Leave this in place until your first follow-up appointment.   05/13/22 0726            Diagnostic Studies: No results found.  Disposition: Discharge disposition: 01-Home or Self Care       Discharge Instructions     Call MD / Call 911   Complete by: As directed    If you experience chest pain or shortness of breath, CALL 911 and be transported to the hospital emergency room.  If you develope a fever above 101 F, pus (white drainage) or increased drainage or redness at the wound, or calf pain, call your surgeon's office.   Change dressing   Complete by: As directed    You may remove the bulky bandage (ACE wrap and gauze) two days after surgery. You will have an adhesive waterproof bandage underneath. Leave this in place until your first follow-up appointment.   Constipation Prevention   Complete by: As  directed    Drink plenty of fluids.  Prune juice may be helpful.  You may use a stool softener, such as Colace (over the counter) 100 mg twice a day.  Use MiraLax (over the counter) for constipation as needed.   Diet - low sodium heart healthy   Complete by: As directed    Do not put a pillow under the knee. Place it under the heel.   Complete by: As directed    Driving restrictions   Complete by: As directed    No driving for two weeks   Post-operative opioid taper instructions:  Complete by: As directed    POST-OPERATIVE OPIOID TAPER INSTRUCTIONS: It is important to wean off of your opioid medication as soon as possible. If you do not need pain medication after your surgery it is ok to stop day one. Opioids include: Codeine, Hydrocodone(Norco, Vicodin), Oxycodone(Percocet, oxycontin) and hydromorphone amongst others.  Long term and even short term use of opiods can cause: Increased pain response Dependence Constipation Depression Respiratory depression And more.  Withdrawal symptoms can include Flu like symptoms Nausea, vomiting And more Techniques to manage these symptoms Hydrate well Eat regular healthy meals Stay active Use relaxation techniques(deep breathing, meditating, yoga) Do Not substitute Alcohol to help with tapering If you have been on opioids for less than two weeks and do not have pain than it is ok to stop all together.  Plan to wean off of opioids This plan should start within one week post op of your joint replacement. Maintain the same interval or time between taking each dose and first decrease the dose.  Cut the total daily intake of opioids by one tablet each day Next start to increase the time between doses. The last dose that should be eliminated is the evening dose.      TED hose   Complete by: As directed    Use stockings (TED hose) for three weeks on both leg(s).  You may remove them at night for sleeping.   Weight bearing as tolerated    Complete by: As directed         Follow-up Information     Aluisio, Homero Fellers, MD. Schedule an appointment as soon as possible for a visit in 2 week(s).   Specialty: Orthopedic Surgery Contact information: [address] [place] [address] [phone]                  Signed: Arther Abbott 05/19/2022, 8:16 AM

## 2022-06-29 ENCOUNTER — Other Ambulatory Visit: Payer: Self-pay | Admitting: Obstetrics and Gynecology

## 2022-06-29 DIAGNOSIS — Z1231 Encounter for screening mammogram for malignant neoplasm of breast: Secondary | ICD-10-CM

## 2022-07-07 ENCOUNTER — Telehealth: Payer: Self-pay

## 2022-07-07 ENCOUNTER — Other Ambulatory Visit: Payer: Self-pay

## 2022-07-07 DIAGNOSIS — N83202 Unspecified ovarian cyst, left side: Secondary | ICD-10-CM

## 2022-07-07 NOTE — Telephone Encounter (Signed)
10/09/2020 visit PLAN   We discussed her ovarian and adnexal cysts. Ultrasound report and images reviewed. Ok to continue observational management with yearly [place] if CA125 is normal.  We discussed laparoscopic BSO.  Would consider anterior colporrhaphy and potential midurethral sling at the time of her laparoscopy.  She would need urodynamic testing in advance of bladder reconstructive surgery.   Fu for annual exam and yearly pelvic US.   I called patient and per DPR access note on file left detailed message in her voice mail that the plan was annual and exam and yearly u/s but that was due 09/2021 so she is past due. I placed u/s order and asked her to call asap and schedule her AEX and u/s with Dr. Quincy Simmonds to follow up.

## 2022-07-07 NOTE — Telephone Encounter (Signed)
[name] Triage Inbound call from patient calling to inquire if an OV or repeat u/s is needed for a cyst she has. Did not report issues but stated its usually done annually.  Please advise patient of next steps.

## 2022-08-18 ENCOUNTER — Ambulatory Visit: Payer: Medicare Other

## 2022-09-03 NOTE — Progress Notes (Signed)
GYNECOLOGY  VISIT   HPI: 75 y.o.   Married  Caucasian  female   [phone] with Patient's last menstrual period was 09/13/1984 (approximate).   here for   U/S consult for a left ovarian cyst.   Feels pressure in the left lower quadrant.  She is followed for a left ovarian cyst and a right adnexal cyst. Patient has been followed since 2015 when an ovarian cyst was detected on an MRI ordered to evaluate back pain.   Her first pelvic ultrasound showed a simple right ovarian cyst 14 mm and a simple left ovarian cyst 21 mm.  She has had normal CA125s.  Her last CA125 was 7.8 on 09/27/19.  Pelvic US done 09/27/19: Uterus absent.  Right ovary with 10 and 5 mm cysts.  Left ovary with 2 cysts:  37 x 25 x 31 mm cyst and 7 mm cyst.  Echogenic material. No abnormal blood flow.  No free fluid.   Pelvic US done 10/09/20: Uterus absent.  Left ovarian simple cyst, 2.8 mm.  Right adnexal cyst 1.4 x 1.0 cm.  No free fluid.   Has had 2 UTIs in the last year.  Her bladder is dropped and she does not feel like she is emptying well. Leaks urine before she gets from the bed to the bathroom at night.  Leaks urine with cough, laugh, or sneeze.  Wears a pad most of the time.   Takes Metamucil.  She can have accidental leakage of stool. No splinting to have BMs.  She will schedule a colonoscopy.  She will return for her routine exam after this is completed.  GYNECOLOGIC HISTORY: Patient's last menstrual period was 09/13/1984 (approximate). 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   Precordial chest pain 12/31/2021   Primary localized osteoarthritis of right knee 10/19/2018   Right foot pain 10/19/2018   Incontinence of feces 09/21/2018   Vulvar lesion 07/20/2018   History of repair of right rotator cuff 03/03/2017   Primary osteoarthritis of both hands/ has had bilateral Mower surgery  03/03/2017   History of hypertension 03/03/2017   History of hyperlipidemia 03/03/2017   DDD (degenerative disc disease), lumbar/ history of lumbar surgery  03/03/2017   Osteopenia of multiple sites 03/03/2017   Chronic left shoulder pain 01/20/2017   Primary osteoarthritis of both knees 01/20/2017   Trapezius muscle spasm 01/20/2017   Insomnia 01/20/2017   Fatigue 01/20/2017   Fibromyalgia 01/20/2017   High risk medications (not anticoagulants) long-term use 01/20/2017   PMR (polymyalgia rheumatica) ([place]) 01/20/2017   Left ovarian cyst 06/02/2015   Degenerative joint disease, shoulder, right 08/02/2013   Precordial pain 02/11/2012   HTN (hypertension) 02/11/2012   Hyperlipidemia 02/11/2012  Past Medical History:  Diagnosis Date   Bilateral ovarian cysts 09/12/2014   bilateral simple ovarian cysts.  [place] due in January 2018.   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 x 20 yr.   Hyperlipidemia    Hypothyroid    IBS (irritable bowel syndrome)    Impingement syndrome of right shoulder 07/2013   Polymyalgia rheumatica ([place])    5-28  states she has been on prednisone and now is trying to only be on plaquenil to manage the polymyalgia    PONV (postoperative nausea and vomiting)    Primary localized osteoarthritis of right knee    Rupture of right rotator cuff 07/2013   VIN I (vulvar intraepithelial neoplasia I) 2017, 2018   this is from a  perineal/vulvar biopsy.   Vitamin B12 deficiency     Past Surgical History:  Procedure Laterality Date   ANTERIOR LAT LUMBAR FUSION  10/08/2008   L3-4   BACK SURGERY     BACK SURGERY  10-03-14   --Dr. Glenna Fellows at [facility]  11/2009   polyps   COLONOSCOPY W/ BIOPSIES  11/2012   polyp, recheck in 3 years   CYSTO  07/05/2006   CYSTOCELE REPAIR  07/05/2006   ESOPHAGOGASTRODUODENOSCOPY ENDOSCOPY  05/25/05   erythema and edema of gastric lumen   EYE SURGERY Right    muscle adjustment    FOOT SURGERY Right    GANGLION CYST EXCISION Right    wrist   INCONTINENCE SURGERY  07/05/2006   cystourethropexy   ORIF FOREARM FRACTURE Right 09/2012   SHOULDER ARTHROSCOPY WITH ROTATOR CUFF REPAIR AND SUBACROMIAL DECOMPRESSION Right 08/02/2013   Procedure: SHOULDER ARTHROSCOPY WITH ROTATOR CUFF REPAIR AND SUBACROMIAL DECOMPRESSION, EXTENSIVE DEBRIDEMENT (INCLUDING LABRUM), PARTIAL ACROMIPLASY WITH CORACOACROMIAL RELEASE, DISTAL CLAVICULECTOMY;  Surgeon: Ninetta Lights, MD;  Location: [facility];  Service: Orthopedics;  Laterality: Right;   STRABISMUS SURGERY Right 01/26/2008   2 other surgeries previously   STRABISMUS SURGERY Right 06/03/2017   Procedure: REPAIR STRABISMUS RIGHT EYE;  Surgeon: Everitt Amber, MD;  Location: [facility];  Service: Ophthalmology;  Laterality: Right;   Thumb surgery  2/13 & ?2010   Bilateral - states a joint was replaced   TOTAL KNEE ARTHROPLASTY Right 02/12/2019   Procedure: TOTAL KNEE ARTHROPLASTY;  Surgeon: Elsie Saas, MD;  Location: [facility];  Service: Orthopedics;  Laterality: Right;   TOTAL KNEE REVISION Right 05/12/2022   Procedure: Right knee polyethylene versus total knee arthroplasty revision;  Surgeon: Gaynelle Arabian, MD;  Location: [facility];  Service: Orthopedics;  Laterality: Right;   TOTAL VAGINAL HYSTERECTOMY  54 - or age 47   secondary AUB   TUBAL LIGATION     laparosopic BTL age 38   varicose  veins Bilateral     Current Outpatient Medications  Medication Sig Dispense Refill   benazepril (LOTENSIN) 40 MG tablet Take 40 mg by mouth daily.      Calcium Carb-Cholecalciferol (CALCIUM 600 + D PO) Take 1 tablet by mouth daily.     cyanocobalamin (VITAMIN B12) 1000 MCG tablet Take 1,000 mcg by mouth daily.     levothyroxine (SYNTHROID) 125 MCG tablet Take 125 mcg by mouth daily before breakfast.     Magnesium 250 MG TABS Take 500 mg by mouth daily.     METAMUCIL FIBER PO Take 6 capsules by mouth  daily.     Multiple Vitamin (MULTIVITAMIN) tablet Take 1 tablet by mouth daily.     No current facility-administered medications for this visit.     ALLERGIES: Penicillins and Sulfa antibiotics  Family History  Problem Relation Age of Onset   Aortic aneurysm Father    Hypertension Father    Coronary artery disease Mother 28   Hypertension Mother    Hyperlipidemia Brother        brain and kidney tumor, no treatment needed    Aneurysm Brother    Healthy Daughter    Healthy Daughter    Healthy Daughter     Social History   Socioeconomic History   Marital status: Married    Spouse name: Not on file   Number of children: 3   Years of education: 14   Highest education level: Not on file  Occupational History   Occupation: retired  Tobacco Use   Smoking status: Never   Smokeless tobacco: Never  Building services engineer Use: Never used  Substance and Sexual Activity   Alcohol use: No    Alcohol/week: 0.0 standard drinks of alcohol   Drug use: No   Sexual activity: Not Currently    Partners: Male    Birth control/protection: Surgical    Comment: Hyst  Other Topics Concern   Not on file  Social History Narrative   Lives at home with husband . One story home. Right handed   Social Determinants of Health   Financial Resource Strain: Not on file  Food Insecurity: Not on file  Transportation Needs: Not on file  Physical Activity: Not on file  Stress: Not on file  Social  Connections: Not on file  Intimate Partner Violence: Not on file    Review of Systems  All other systems reviewed and are negative.   PHYSICAL EXAMINATION:    BP 126/72 (BP Location: Right Arm, Patient Position: Sitting, Cuff Size: Normal)   Pulse 75   Ht 5\' 8"  (1.727 m)   Wt 175 lb (79.4 kg)   LMP 09/13/1984 (Approximate)   SpO2 98%   BMI 26.61 kg/m     General appearance: alert, cooperative and appears stated age   Pelvic: External genitalia:  no lesions              Urethra:  normal appearing urethra with no masses, tenderness or lesions              Bartholins and Skenes: normal                 Vagina: normal appearing vagina with normal color and discharge, no lesions.  Second degree cystocele.  Good apical and posterior vaginal wall support.               Cervix: absent                Bimanual Exam:  Uterus:  absent              Adnexa: no mass, fullness, tenderness on right.  Left adnexal fullness, nontender.              Rectal exam: yes.  Confirms.              Anus:  normal sphincter tone, no lesions  Chaperone was present for exam:  Emily  Pelvic 11/11/1984  Uterus absent.  Normal vaginal cuff.  Left ovary:  4.23 x 3.49 x 3.33 cm.  3.9 cm simple cyst, avascular.  Right ovary:  1.84 x 1.27 x 1.65 cm.  Normal.  No adnexal masses.  No free fluid.   ASSESSMENT  Status post TVH.  Left ovarian cyst.  Simple.  Slightly increased in size.  Second degree cystocele, recurrent.   Status post anterior colporrhaphy. Urinary stress incontinence.   PLAN  Pelvic US findings and images reviewed.  Her ovarian cyst is persistent at a minimum and has increased and decreased in size but remains simple in nature. CA125 today. We discussed potential cystocele repair, +/- midurethral sling, laparoscopic BSO.  She will consider.  She will return for her routine exam.    An After Visit Summary was printed and given to the patient.  26 min  total time was spent for this patient  encounter, including preparation, face-to-face counseling with the patient, coordination of care, and documentation of the encounter.

## 2022-09-08 NOTE — Telephone Encounter (Signed)
Patient is scheduled 09/16/2022 for pelvic u/s and scheduled 09/29/2021 for AEX.

## 2022-09-16 ENCOUNTER — Ambulatory Visit (INDEPENDENT_AMBULATORY_CARE_PROVIDER_SITE_OTHER): Payer: Medicare Other

## 2022-09-16 ENCOUNTER — Encounter: Payer: Self-pay | Admitting: Obstetrics and Gynecology

## 2022-09-16 ENCOUNTER — Ambulatory Visit: Payer: Medicare Other | Admitting: Obstetrics and Gynecology

## 2022-09-16 VITALS — BP 126/72 | HR 75 | Ht 68.0 in | Wt 175.0 lb

## 2022-09-16 DIAGNOSIS — N83202 Unspecified ovarian cyst, left side: Secondary | ICD-10-CM

## 2022-09-16 DIAGNOSIS — N393 Stress incontinence (female) (male): Secondary | ICD-10-CM

## 2022-09-16 DIAGNOSIS — N8110 Cystocele, unspecified: Secondary | ICD-10-CM

## 2022-09-17 LAB — CA 125: CA 125: 8 U/mL (ref <35)

## 2022-09-29 ENCOUNTER — Ambulatory Visit: Payer: Medicare Other | Admitting: Obstetrics and Gynecology

## 2022-10-04 ENCOUNTER — Ambulatory Visit: Payer: Medicare Other

## 2022-10-05 NOTE — Progress Notes (Signed)
77 y.o. [id number] Married Caucasian female here for annual exam.    Grand daughter had a serious car accident and is in hospital at [facility].  She had to cancel her mammogram and will reschedule.   No dysuria, but her urine is strong smelling.   Patient is currently declining surgery for her ovarian cyst, stress incontinence, and bladder prolapse due to her family's needs right now.  She started taking Boniva for osteoporosis.   PCP:   Claris Gower, MD  Patient's last menstrual period was 09/13/1984 (approximate).           Sexually active: No.  The current method of family planning is status post hysterectomy.    Exercising: No. Knee surgery 04/2022 Smoker:  no  Health Maintenance: Pap:  07/26/18 neg, 07/20/18 neg, 06/27/17 neg History of abnormal Pap:  no MMG:  07/30/21 Breast Density Category B, BI-RADS CATEGORY 1 Neg Colonoscopy:  01/25/13 BMD:   08/26/22 Result  Osteoporosis  Dr. Suzette Battiest ordering.  Started Fosamax 09/2022 TDaP:  09/13/12 - She states she is due.  Gardasil:   no HIV: unsure Hep C: 04/05/16 Neg Screening Labs: PCP  Shingrix:  completed.    reports that she has never smoked. She has been exposed to tobacco smoke. She has never used smokeless tobacco. She reports that she does not drink alcohol and does not use drugs.  Past Medical History:  Diagnosis Date   Bilateral ovarian cysts 09/12/2014   bilateral simple ovarian cysts.  [place] due in January 2018.   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 x 20 yr.   Hyperlipidemia    Hypothyroid    IBS (irritable bowel syndrome)    Impingement syndrome of right shoulder 07/2013   Osteoporosis    Polymyalgia rheumatica ([place])    5-28  states she has been on prednisone and now is trying to only be on plaquenil to manage the polymyalgia    PONV (postoperative nausea and vomiting)    Primary localized osteoarthritis of right knee     Rupture of right rotator cuff 07/2013   VIN I (vulvar intraepithelial neoplasia I) 2017, 2018   this is from a perineal/vulvar biopsy.   Vitamin B12 deficiency     Past Surgical History:  Procedure Laterality Date   ANTERIOR LAT LUMBAR FUSION  10/08/2008   L3-4   BACK SURGERY     BACK SURGERY  10-03-14   --Dr. Glenna Fellows at [facility]  11/2009   polyps   COLONOSCOPY W/ BIOPSIES  11/2012   polyp, recheck in 3 years   CYSTO  07/05/2006   CYSTOCELE REPAIR  07/05/2006   ESOPHAGOGASTRODUODENOSCOPY ENDOSCOPY  05/25/05   erythema and edema of gastric lumen   EYE SURGERY Right    muscle adjustment    FOOT SURGERY Right    GANGLION CYST EXCISION Right    wrist   INCONTINENCE SURGERY  07/05/2006   cystourethropexy   ORIF FOREARM FRACTURE Right 09/2012   SHOULDER ARTHROSCOPY WITH ROTATOR CUFF REPAIR AND SUBACROMIAL DECOMPRESSION Right 08/02/2013   Procedure: SHOULDER ARTHROSCOPY WITH ROTATOR CUFF REPAIR AND SUBACROMIAL DECOMPRESSION, EXTENSIVE DEBRIDEMENT (INCLUDING LABRUM), PARTIAL ACROMIPLASY WITH CORACOACROMIAL RELEASE, DISTAL CLAVICULECTOMY;  Surgeon: Ninetta Lights, MD;  Location: [facility];  Service: Orthopedics;  Laterality: Right;   STRABISMUS SURGERY Right 01/26/2008   2 other surgeries previously   STRABISMUS  SURGERY Right 06/03/2017   Procedure: REPAIR STRABISMUS RIGHT EYE;  Surgeon: Everitt Amber, MD;  Location: [facility];  Service: Ophthalmology;  Laterality: Right;   Thumb surgery  2/13 & ?2010   Bilateral - states a joint was replaced   TOTAL KNEE ARTHROPLASTY Right 02/12/2019   Procedure: TOTAL KNEE ARTHROPLASTY;  Surgeon: Elsie Saas, MD;  [facility];  Service: Orthopedics;  Laterality: Right;   TOTAL KNEE REVISION Right 05/12/2022   Procedure: Right knee polyethylene versus total knee arthroplasty revision;  Surgeon: Gaynelle Arabian, MD;  [facility];  Service: Orthopedics;  Laterality: Right;    TOTAL VAGINAL HYSTERECTOMY  43 - or age 80   secondary AUB   TUBAL LIGATION     laparosopic BTL age 48   varicose veins Bilateral     Current Outpatient Medications  Medication Sig Dispense Refill   benazepril (LOTENSIN) 40 MG tablet Take 40 mg by mouth daily.      Calcium Carb-Cholecalciferol (CALCIUM 600 + D PO) Take 1 tablet by mouth daily.     cyanocobalamin (VITAMIN B12) 1000 MCG tablet Take 1,000 mcg by mouth daily.     ibandronate (BONIVA) 150 MG tablet Take 150 mg by mouth every 30 (thirty) days. Take in the morning with a full glass of water, on an empty stomach, and do not take anything else by mouth or lie down for the next 30 min.     levothyroxine (SYNTHROID) 125 MCG tablet Take 125 mcg by mouth daily before breakfast.     Magnesium 250 MG TABS Take 500 mg by mouth daily.     METAMUCIL FIBER PO Take 6 capsules by mouth daily.     Multiple Vitamin (MULTIVITAMIN) tablet Take 1 tablet by mouth daily.     No current facility-administered medications for this visit.    Family History  Problem Relation Age of Onset   Aortic aneurysm Father    Hypertension Father    Coronary artery disease Mother 77   Hypertension Mother    Hyperlipidemia Brother        brain and kidney tumor, no treatment needed    Aneurysm Brother    Healthy Daughter    Healthy Daughter    Healthy Daughter     Review of Systems  All other systems reviewed and are negative.   Exam:   BP 116/78 (BP Location: Left Arm, Patient Position: Sitting, Cuff Size: Normal)   Pulse 72   Ht 5' 6.5" (1.689 m)   Wt 172 lb (78 kg)   LMP 09/13/1984 (Approximate)   SpO2 96%   BMI 27.35 kg/m     General appearance: alert, cooperative and appears stated age Head: normocephalic, without obvious abnormality, atraumatic Neck: no adenopathy, supple, symmetrical, trachea midline and thyroid normal to inspection and palpation Lungs: clear to auscultation bilaterally Breasts: normal appearance, no masses or  tenderness, No nipple retraction or dimpling, No nipple discharge or bleeding, No axillary adenopathy Heart: regular rate and rhythm Abdomen: soft, non-tender; no masses, no organomegaly Extremities: extremities normal, atraumatic, no cyanosis or edema Skin: skin color, texture, turgor normal. No rashes or lesions Lymph nodes: cervical, supraclavicular, and axillary nodes normal. Neurologic: grossly normal  Pelvic: External genitalia:  no lesions              No abnormal inguinal nodes palpated.              Urethra:  normal appearing urethra with no masses, tenderness or lesions  Bartholins and Skenes: normal                 Vagina: normal appearing vagina with normal color and discharge, no lesions.  Second degree cystocele.              Cervix: absent              Pap taken: no Bimanual Exam:  Uterus:  absent              Adnexa: no mass, fullness, tenderness on right.  Left adnexal fullness but no tenderness.              Rectal exam: yes.  Confirms.              Anus:  normal sphincter tone, no lesions  Chaperone was present for exam:  Irving Burton.  Assessment:   Well woman visit with gynecologic exam. Status post TVH.  Left ovarian cyst.  Simple.  Slightly increased in size.   CA125 normal.  Second degree cystocele, recurrent.   Status post anterior colporrhaphy. Urinary stress incontinence.  Hx VIN.  Osteoporosis.  On Fosamax.  Dr. Talmage Nap.   Plan: Mammogram screening discussed.  She will schedule. Self breast awareness reviewed. Pap and HR HPV not indicated.  Guidelines for Calcium, Vitamin D, regular exercise program including cardiovascular and weight bearing exercise. TDap.  FU [place] and Ca125 in one year.  She will let me know if she wishes to proceed with surgical care.  Follow up annually and prn.   After visit summary provided.   10 min  total time was spent for this patient encounter, including preparation, face-to-face counseling with the patient,  coordination of care, and documentation of the encounter in addition to doing breast and pelvic exam.

## 2022-10-19 ENCOUNTER — Encounter: Payer: Self-pay | Admitting: Obstetrics and Gynecology

## 2022-10-19 ENCOUNTER — Ambulatory Visit (INDEPENDENT_AMBULATORY_CARE_PROVIDER_SITE_OTHER): Payer: Medicare Other | Admitting: Obstetrics and Gynecology

## 2022-10-19 VITALS — BP 116/78 | HR 72 | Ht 66.5 in | Wt 172.0 lb

## 2022-10-19 DIAGNOSIS — Z01419 Encounter for gynecological examination (general) (routine) without abnormal findings: Secondary | ICD-10-CM

## 2022-10-19 DIAGNOSIS — N83202 Unspecified ovarian cyst, left side: Secondary | ICD-10-CM

## 2022-10-19 DIAGNOSIS — N814 Uterovaginal prolapse, unspecified: Secondary | ICD-10-CM

## 2022-10-19 DIAGNOSIS — Z23 Encounter for immunization: Secondary | ICD-10-CM | POA: Diagnosis not present

## 2022-10-19 NOTE — Patient Instructions (Signed)

## 2023-01-31 ENCOUNTER — Ambulatory Visit
Admission: EM | Admit: 2023-01-31 | Discharge: 2023-01-31 | Disposition: A | Discharge status: Home / Self Care | Payer: Medicare Other | Attending: Family Medicine | Admitting: Family Medicine

## 2023-01-31 DIAGNOSIS — N3001 Acute cystitis with hematuria: Secondary | ICD-10-CM | POA: Diagnosis present

## 2023-01-31 LAB — POCT URINALYSIS DIP (MANUAL ENTRY)
Bilirubin, UA: NEGATIVE
Glucose, UA: NEGATIVE mg/dL
Ketones, POC UA: NEGATIVE mg/dL
Nitrite, UA: POSITIVE — AB
Protein Ur, POC: 30 mg/dL — AB
Spec Grav, UA: 1.025 (ref 1.010–1.025)
Urobilinogen, UA: 0.2 E.U./dL
pH, UA: 6 (ref 5.0–8.0)

## 2023-01-31 MED ORDER — CEPHALEXIN 500 MG PO CAPS: 500.0000 mg | ORAL_CAPSULE | Freq: Two times a day (BID) | ORAL | 0 refills | Status: AC

## 2023-01-31 NOTE — ED Triage Notes (Signed)
Pt presents  with c/o intermittent urinary frequency, dysuria X 4 days.   Pt denies abd pain, n/v/d.

## 2023-01-31 NOTE — ED Provider Notes (Signed)
[facility]    CSN: [id number] Arrival date & time: 01/31/23  0818      History   Chief Complaint Chief Complaint  Patient presents with   Urinary Tract Infection    HPI [name] is a 77 y.o. female.   Patient presents to urgent care for evaluation of  urinary frequency and dysuria that started 4 ago. History of suspected bladder prolapse and states "her bladder dropped" again a year ago leading to frequent UTIs. She has had her bladder tacked in the past but this was many years ago. She has had some sweats and chills without known documented fever over the last 2-3 days. Denies nausea, vomiting, diarrhea, abdominal pain, dizziness, headache, and lightheadedness. Denies bilateral flank pain. Denies use of antibiotics/steroids in the last 3 months. Denies history of diabetes and use of SGLT-2 inhibitor. Denies use of any OTC medications prior to arrival for urinary symptoms.    Urinary Tract Infection   Past Medical History:  Diagnosis Date   Bilateral ovarian cysts 09/12/2014   bilateral simple ovarian cysts.  [place] due in January 2018.   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 x 20 yr.   Hyperlipidemia    Hypothyroid    IBS (irritable bowel syndrome)    Impingement syndrome of right shoulder 07/2013   Osteoporosis    Polymyalgia rheumatica (HCC)    5-28  states she has been on prednisone and now is trying to only be on plaquenil to manage the polymyalgia    PONV (postoperative nausea and vomiting)    Primary localized osteoarthritis of right knee    Rupture of right rotator cuff 07/2013   VIN I (vulvar intraepithelial neoplasia I) 2017, 2018   this is from a perineal/vulvar biopsy.   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   Precordial chest pain 12/31/2021   Primary localized osteoarthritis of right knee 10/19/2018   Right foot pain 10/19/2018   Incontinence of feces 09/21/2018   Vulvar lesion 07/20/2018   History of repair of right rotator cuff 03/03/2017   Primary osteoarthritis of both hands/ has had bilateral [facility] surgery  03/03/2017   History of hypertension 03/03/2017   History of hyperlipidemia 03/03/2017   DDD (degenerative disc disease), lumbar/ history of lumbar surgery  03/03/2017   Osteopenia of multiple sites 03/03/2017   Chronic left shoulder pain 01/20/2017   Primary osteoarthritis of both knees 01/20/2017   Trapezius muscle spasm 01/20/2017   Insomnia 01/20/2017   Fatigue 01/20/2017   Fibromyalgia 01/20/2017   High risk medications (not anticoagulants) long-term use 01/20/2017   PMR (polymyalgia rheumatica) (HCC) 01/20/2017   Left ovarian cyst 06/02/2015   Degenerative joint disease, shoulder, right 08/02/2013   Precordial pain 02/11/2012   HTN (hypertension) 02/11/2012   Hyperlipidemia 02/11/2012    Past Surgical History:  Procedure Laterality Date   ANTERIOR LAT LUMBAR FUSION  10/08/2008   L3-4   BACK SURGERY     BACK SURGERY  10-03-14   --Dr. Trey Sailors at [facility]   COLONOSCOPY W/ BIOPSIES  11/2009   polyps   COLONOSCOPY W/ BIOPSIES  11/2012   polyp, recheck in 3 years   CYSTO  07/05/2006   CYSTOCELE REPAIR  07/05/2006   ESOPHAGOGASTRODUODENOSCOPY ENDOSCOPY  05/25/05   erythema and edema of gastric lumen   EYE SURGERY Right    muscle adjustment    FOOT SURGERY Right    GANGLION CYST EXCISION Right    wrist   INCONTINENCE SURGERY  07/05/2006   cystourethropexy   ORIF FOREARM FRACTURE Right 09/2012   SHOULDER ARTHROSCOPY WITH ROTATOR CUFF REPAIR AND SUBACROMIAL DECOMPRESSION Right 08/02/2013   Procedure: SHOULDER ARTHROSCOPY WITH ROTATOR CUFF REPAIR AND SUBACROMIAL DECOMPRESSION, EXTENSIVE DEBRIDEMENT (INCLUDING LABRUM), PARTIAL ACROMIPLASY WITH  CORACOACROMIAL RELEASE, DISTAL CLAVICULECTOMY;  Surgeon: Loreta Ave, MD;  Location: [facility];  Service: Orthopedics;  Laterality: Right;   STRABISMUS SURGERY Right 01/26/2008   2 other surgeries previously   STRABISMUS SURGERY Right 06/03/2017   Procedure: REPAIR STRABISMUS RIGHT EYE;  Surgeon: Verne Carrow, MD;  Location: [facility];  Service: Ophthalmology;  Laterality: Right;   Thumb surgery  2/13 & ?2010   Bilateral - states a joint was replaced   TOTAL KNEE ARTHROPLASTY Right 02/12/2019   Procedure: TOTAL KNEE ARTHROPLASTY;  Surgeon: Salvatore Marvel, MD;  Location: [facility];  Service: Orthopedics;  Laterality: Right;   TOTAL KNEE REVISION Right 05/12/2022   Procedure: Right knee polyethylene versus total knee arthroplasty revision;  Surgeon: Ollen Gross, MD;  Location: [facility];  Service: Orthopedics;  Laterality: Right;   TOTAL VAGINAL HYSTERECTOMY  1986 - or age 67   secondary AUB   TUBAL LIGATION     laparosopic BTL age 47   varicose veins Bilateral     OB History     Gravida  3   Para  3   Term  3   Preterm  0   AB  0   Living  3      SAB  0   IAB  0   Ectopic  0   Multiple  0   Live Births  3            Home Medications    Prior to Admission medications   Medication Sig Start Date End Date Taking? Authorizing Provider  cephALEXin (KEFLEX) 500 MG capsule Take 1 capsule (500 mg total) by mouth 2 (two) times daily for 7 days. 01/31/23 02/07/23 Yes Carlisle Beers, FNP  benazepril (LOTENSIN) 40 MG tablet Take 40 mg by mouth daily.  05/25/18   [provider]  Calcium Carb-Cholecalciferol (CALCIUM 600 + D PO) Take 1 tablet by mouth daily.    [provider]  cyanocobalamin (VITAMIN B12) 1000 MCG tablet Take 1,000 mcg by mouth daily.    [provider]  ibandronate (BONIVA) 150 MG tablet Take 150 mg by mouth every 30 (thirty) days. Take in the morning with a full glass of water, on an empty  stomach, and do not take anything else by mouth or lie down for the next 30 min.    [provider]  levothyroxine (SYNTHROID) 125 MCG tablet Take 125 mcg by mouth daily before breakfast. 09/23/19   [provider]  Magnesium 250 MG TABS Take 500 mg by mouth daily.    [provider]  METAMUCIL FIBER PO Take 6 capsules by mouth daily.    [provider]  Multiple Vitamin (MULTIVITAMIN) tablet Take 1 tablet by mouth daily.    [provider]    Family History Family History  Problem Relation Age of Onset   Aortic aneurysm Father    Hypertension Father    Coronary artery disease  Mother 75   Hypertension Mother    Hyperlipidemia Brother        brain and kidney tumor, no treatment needed    Aneurysm Brother    Healthy Daughter    Healthy Daughter    Healthy Daughter     Social History Social History   Tobacco Use   Smoking status: Never    Passive exposure: Past   Smokeless tobacco: Never  Vaping Use   Vaping Use: Never used  Substance Use Topics   Alcohol use: No    Alcohol/week: 0.0 standard drinks of alcohol   Drug use: No     Allergies   Penicillins and Sulfa antibiotics   Review of Systems Review of Systems Per HPI  Physical Exam Triage Vital Signs ED Triage Vitals [01/31/23 0912]  Enc Vitals Group     BP 104/72     Pulse Rate (!) 101     Resp 18     Temp 98.3 F (36.8 C)     Temp Source Oral     SpO2 94 %     Weight      Height      Head Circumference      Peak Flow      Pain Score 0     Pain Loc      Pain Edu?      Excl. in GC?    No data found.  Updated Vital Signs BP 104/72 (BP Location: Right Arm)   Pulse (!) 101   Temp 98.3 F (36.8 C) (Oral)   Resp 18   LMP 09/13/1984 (Approximate)   SpO2 94%   Visual Acuity Right Eye Distance:   Left Eye Distance:   Bilateral Distance:    Right Eye Near:   Left Eye Near:    Bilateral Near:     Physical Exam Vitals and nursing note reviewed.   Constitutional:      Appearance: She is not ill-appearing or toxic-appearing.  HENT:     Head: Normocephalic and atraumatic.     Right Ear: Hearing, tympanic membrane, ear canal and external ear normal.     Left Ear: Hearing, tympanic membrane, ear canal and external ear normal.     Nose: Nose normal.     Mouth/Throat:     Lips: Pink.     Mouth: Mucous membranes are moist. No injury.     Tongue: No lesions. Tongue does not deviate from midline.     Palate: No mass and lesions.     Pharynx: Oropharynx is clear. Uvula midline. No pharyngeal swelling, oropharyngeal exudate, posterior oropharyngeal erythema or uvula swelling.     Tonsils: No tonsillar exudate or tonsillar abscesses.  Eyes:     General: Lids are normal. Vision grossly intact. Gaze aligned appropriately.     Extraocular Movements: Extraocular movements intact.     Conjunctiva/sclera: Conjunctivae normal.  Cardiovascular:     Rate and Rhythm: Regular rhythm. Tachycardia present.     Heart sounds: Normal heart sounds, S1 normal and S2 normal.  Pulmonary:     Effort: Pulmonary effort is normal. No respiratory distress.     Breath sounds: Normal breath sounds and air entry.  Abdominal:     General: Bowel sounds are normal.     Palpations: Abdomen is soft.     Tenderness: There is no abdominal tenderness. There is no right CVA tenderness, left CVA tenderness or guarding.  Musculoskeletal:     Cervical back: Neck supple.  Skin:  General: Skin is warm and dry.     Capillary Refill: Capillary refill takes less than 2 seconds.     Findings: No rash.  Neurological:     General: No focal deficit present.     Mental Status: She is alert and oriented to person, place, and time. Mental status is at baseline.     Cranial Nerves: No dysarthria or facial asymmetry.  Psychiatric:        Mood and Affect: Mood normal.        Speech: Speech normal.        Behavior: Behavior normal.        Thought Content: Thought content normal.         Judgment: Judgment normal.      UC Treatments / Results  Labs (all labs ordered are listed, but only abnormal results are displayed) Labs Reviewed  POCT URINALYSIS DIP (MANUAL ENTRY) - Abnormal; Notable for the following components:      Result Value   Clarity, UA cloudy (*)    Blood, UA moderate (*)    Protein Ur, POC =30 (*)    Nitrite, UA Positive (*)    Leukocytes, UA Small (1+) (*)    All other components within normal limits  URINE CULTURE    EKG   Radiology No results found.  Procedures Procedures (including critical care time)  Medications Ordered in UC Medications - No data to display  Initial Impression / Assessment and Plan / UC Course  I have reviewed the triage vital signs and the nursing notes.  Pertinent labs & imaging results that were available during my care of the patient were reviewed by me and considered in my medical decision making (see chart for details).   1. Acute cystitis with hematuria Presentation is consistent with acute uncomplicated cystitis. Patient is nontoxic in appearance with hemodynamically stable vital signs. Low suspicion for acute pyelonephritis. Low suspicion for kidney stone or infected stone. Urine pregnancy is negative. No indication for labs or imaging at this time.  Keflex sent to pharmacy. Denies allergies to antibiotics. Urine culture pending. Patient to push fluids to stay well hydrated and reduce intake of known urinary irritants.  Discussed physical exam and available lab work findings in clinic with patient.  Counseled patient regarding appropriate use of medications and potential side effects for all medications recommended or prescribed today. Discussed red flag signs and symptoms of worsening condition,when to call the PCP office, return to urgent care, and when to seek higher level of care in the emergency department. Patient verbalizes understanding and agreement with plan. All questions answered. Patient  discharged in stable condition.    Final Clinical Impressions(s) / UC Diagnoses   Final diagnoses:  Acute cystitis with hematuria     Discharge Instructions      Your urine shows you likely have a urinary tract infection. I have sent your urine for culture to confirm this. We will go ahead and have you start taking antibiotics due to your symptoms.  Take antibiotic as directed.  (Keflex 500mg  every 12 hours for 7 days) If you develop diarrhea while taking this medication you may purchase an over-the-counter probiotic or eat yogurt with live active cultures.  To avoid GI upset please take this medication with food. I have sent your urine for culture to see what type of bacteria grows. We will call you if we need to change the treatment plan based on the results of your urine culture.  If you develop  any new or worsening symptoms or do not improve in the next 2 to 3 days, please return.  If your symptoms are severe, please go to the emergency room.  Follow-up with your primary care provider for further evaluation and management of your symptoms as well as ongoing wellness visits.  I hope you feel better!     ED Prescriptions     Medication Sig Dispense Auth. Provider   cephALEXin (KEFLEX) 500 MG capsule Take 1 capsule (500 mg total) by mouth 2 (two) times daily for 7 days. 14 capsule Carlisle Beers, FNP      PDMP not reviewed this encounter.   Reita May [place], [place] 01/31/23 [phone]

## 2023-01-31 NOTE — Discharge Instructions (Addendum)
Your urine shows you likely have a urinary tract infection. I have sent your urine for culture to confirm this. We will go ahead and have you start taking antibiotics due to your symptoms.  Take antibiotic as directed.  (Keflex 500mg every 12 hours for 7 days) If you develop diarrhea while taking this medication you may purchase an over-the-counter probiotic or eat yogurt with live active cultures.  To avoid GI upset please take this medication with food. I have sent your urine for culture to see what type of bacteria grows. We will call you if we need to change the treatment plan based on the results of your urine culture.  If you develop any new or worsening symptoms or do not improve in the next 2 to 3 days, please return.  If your symptoms are severe, please go to the emergency room.  Follow-up with your primary care provider for further evaluation and management of your symptoms as well as ongoing wellness visits.  I hope you feel better!  

## 2023-02-02 LAB — URINE CULTURE: Culture: >=100000 — AB

## 2023-03-10 ENCOUNTER — Ambulatory Visit: Payer: Medicare Other | Admitting: Podiatry

## 2023-05-23 ENCOUNTER — Encounter: Payer: Self-pay | Admitting: Obstetrics and Gynecology

## 2023-05-23 ENCOUNTER — Telehealth: Payer: Self-pay | Admitting: Obstetrics and Gynecology

## 2023-05-23 ENCOUNTER — Ambulatory Visit: Payer: Medicare Other | Admitting: Obstetrics and Gynecology

## 2023-05-23 VITALS — BP 112/78 | HR 103 | Ht 66.5 in | Wt 179.0 lb

## 2023-05-23 DIAGNOSIS — R102 Pelvic and perineal pain: Secondary | ICD-10-CM | POA: Diagnosis not present

## 2023-05-23 DIAGNOSIS — N83202 Unspecified ovarian cyst, left side: Secondary | ICD-10-CM

## 2023-05-23 DIAGNOSIS — N393 Stress incontinence (female) (male): Secondary | ICD-10-CM | POA: Diagnosis not present

## 2023-05-23 DIAGNOSIS — N8110 Cystocele, unspecified: Secondary | ICD-10-CM

## 2023-05-23 DIAGNOSIS — R300 Dysuria: Secondary | ICD-10-CM | POA: Diagnosis not present

## 2023-05-23 DIAGNOSIS — R3915 Urgency of urination: Secondary | ICD-10-CM | POA: Diagnosis not present

## 2023-05-23 MED ORDER — CEPHALEXIN 500 MG PO CAPS: 500.0000 mg | ORAL_CAPSULE | Freq: Two times a day (BID) | ORAL | 0 refills | Status: DC

## 2023-05-23 NOTE — Patient Instructions (Signed)
Urinary Tract Infection, Adult A urinary tract infection (UTI) is an infection of any part of the urinary tract. The urinary tract includes: The kidneys. The ureters. The bladder. The urethra. These organs make, store, and get rid of pee (urine) in the body. What are the causes? This infection is caused by germs (bacteria) in your genital area. These germs grow and cause swelling (inflammation) of your urinary tract. What increases the risk? The following factors may make you more likely to develop this condition: Using a small, thin tube (catheter) to drain pee. Not being able to control when you pee or poop (incontinence). Being female. If you are female, these things can increase the risk: Using these methods to prevent pregnancy: A medicine that kills sperm (spermicide). A device that blocks sperm (diaphragm). Having low levels of a female hormone (estrogen). Being pregnant. You are more likely to develop this condition if: You have genes that add to your risk. You are sexually active. You take antibiotic medicines. You have trouble peeing because of: A prostate that is bigger than normal, if you are female. A blockage in the part of your body that drains pee from the bladder. A kidney stone. A nerve condition that affects your bladder. Not getting enough to drink. Not peeing often enough. You have other conditions, such as: Diabetes. A weak disease-fighting system (immune system). Sickle cell disease. Gout. Injury of the spine. What are the signs or symptoms? Symptoms of this condition include: Needing to pee right away. Peeing small amounts often. Pain or burning when peeing. Blood in the pee. Pee that smells bad or not like normal. Trouble peeing. Pee that is cloudy. Fluid coming from the vagina, if you are female. Pain in the belly or lower back. Other symptoms include: Vomiting. Not feeling hungry. Feeling mixed up (confused). This may be the first symptom in  older adults. Being tired and grouchy (irritable). A fever. Watery poop (diarrhea). How is this treated? Taking antibiotic medicine. Taking other medicines. Drinking enough water. In some cases, you may need to see a specialist. Follow these instructions at home:  Medicines Take over-the-counter and prescription medicines only as told by your doctor. If you were prescribed an antibiotic medicine, take it as told by your doctor. Do not stop taking it even if you start to feel better. General instructions Make sure you: Pee until your bladder is empty. Do not hold pee for a long time. Empty your bladder after sex. Wipe from front to back after peeing or pooping if you are a female. Use each tissue one time when you wipe. Drink enough fluid to keep your pee pale yellow. Keep all follow-up visits. Contact a doctor if: You do not get better after 1-2 days. Your symptoms go away and then come back. Get help right away if: You have very bad back pain. You have very bad pain in your lower belly. You have a fever. You have chills. You feeling like you will vomit or you vomit. Summary A urinary tract infection (UTI) is an infection of any part of the urinary tract. This condition is caused by germs in your genital area. There are many risk factors for a UTI. Treatment includes antibiotic medicines. Drink enough fluid to keep your pee pale yellow. This information is not intended to replace advice given to you by your health care provider. Make sure you discuss any questions you have with your health care provider. Document Revised: 04/06/2020 Document Reviewed: 04/11/2020 Elsevier Patient Education    2024 [employer]  

## 2023-05-23 NOTE — Telephone Encounter (Signed)
Please make referral to [facility] for potential surgical consultation for prolapse of bladder, stress urinary incontinence and right ovarian cyst.

## 2023-05-23 NOTE — Progress Notes (Signed)
GYNECOLOGY  VISIT   HPI: 77 y.o.   Married  Caucasian  female   [phone] with Patient's last menstrual period was 09/13/1984 (approximate).   here for   UTI-urinary urgency, dysuria, pelvic pain, hot and cold sweats but no fever, symptoms onset 4 days ago.  Has had some right sided pain.   This is her third UTI this year.  Can take Keflex.  Took it in May, 2024 without problem.  She has a prolapse of her bladder.  Some urinary incontinence with cough, laugh, sneeze, or bends over.  No constipation or accidental leakage of stool.  Fiber helps to control her bowel function.  She is considering surgical care after the Holidays.  GYNECOLOGIC HISTORY: Patient's last menstrual period was 09/13/1984 (approximate). 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   Precordial chest pain 12/31/2021   Primary localized osteoarthritis of right knee 10/19/2018   Right foot pain 10/19/2018   Incontinence of feces 09/21/2018   Vulvar lesion 07/20/2018   History of repair of right rotator cuff 03/03/2017   Primary osteoarthritis of both hands/ has had bilateral CMC surgery  03/03/2017   History of hypertension 03/03/2017   History of hyperlipidemia 03/03/2017   DDD (degenerative disc disease), lumbar/ history of lumbar surgery  03/03/2017   Osteopenia of multiple sites 03/03/2017   Chronic left shoulder pain 01/20/2017   Primary osteoarthritis of both knees 01/20/2017   Trapezius muscle spasm  01/20/2017   Insomnia 01/20/2017   Fatigue 01/20/2017   Fibromyalgia 01/20/2017   High risk medications (not anticoagulants) long-term use 01/20/2017   PMR (polymyalgia rheumatica) (HCC) 01/20/2017   Left ovarian cyst 06/02/2015   Degenerative joint disease, shoulder, right 08/02/2013   Precordial pain 02/11/2012   HTN (hypertension) 02/11/2012   Hyperlipidemia 02/11/2012    Past Medical History:  Diagnosis Date   Bilateral ovarian cysts 09/12/2014   bilateral simple ovarian cysts.  [place] due in January 2018.   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 x 20 yr.   Hyperlipidemia    Hypothyroid    IBS (irritable bowel syndrome)    Impingement syndrome of right shoulder 07/2013   Osteoporosis    Polymyalgia rheumatica (HCC)    5-28  states she has been on prednisone and now is trying to only be on plaquenil to manage the polymyalgia    PONV (postoperative nausea and vomiting)    Primary localized osteoarthritis of right knee    Rupture of right rotator cuff 07/2013   VIN I (vulvar intraepithelial neoplasia I) 2017, 2018   this is from a perineal/vulvar biopsy.  Vitamin B12 deficiency     Past Surgical History:  Procedure Laterality Date   ANTERIOR LAT LUMBAR FUSION  10/08/2008   L3-4   BACK SURGERY     BACK SURGERY  10-03-14   --Dr. Trey Sailors [facility]   COLONOSCOPY W/ BIOPSIES  11/2009   polyps   COLONOSCOPY W/ BIOPSIES  11/2012   polyp, recheck in 3 years   CYSTO  07/05/2006   CYSTOCELE REPAIR  07/05/2006   ESOPHAGOGASTRODUODENOSCOPY ENDOSCOPY  05/25/05   erythema and edema of gastric lumen   EYE SURGERY Right    muscle adjustment    FOOT SURGERY Right    GANGLION CYST EXCISION Right    wrist   INCONTINENCE SURGERY  07/05/2006   cystourethropexy   ORIF FOREARM FRACTURE Right 09/2012   SHOULDER ARTHROSCOPY WITH ROTATOR CUFF REPAIR AND SUBACROMIAL DECOMPRESSION  Right 08/02/2013   Procedure: SHOULDER ARTHROSCOPY WITH ROTATOR CUFF REPAIR AND SUBACROMIAL DECOMPRESSION, EXTENSIVE DEBRIDEMENT (INCLUDING LABRUM), PARTIAL ACROMIPLASY WITH CORACOACROMIAL RELEASE, DISTAL CLAVICULECTOMY;  Surgeon: Loreta Ave, MD;  Location: [facility];  Service: Orthopedics;  Laterality: Right;   STRABISMUS SURGERY Right 01/26/2008   2 other surgeries previously   STRABISMUS SURGERY Right 06/03/2017   Procedure: REPAIR STRABISMUS RIGHT EYE;  Surgeon: Verne Carrow, MD;  Location: [facility];  Service: Ophthalmology;  Laterality: Right;   Thumb surgery  2/13 & ?2010   Bilateral - states a joint was replaced   TOTAL KNEE ARTHROPLASTY Right 02/12/2019   Procedure: TOTAL KNEE ARTHROPLASTY;  Surgeon: Salvatore Marvel, MD;  Location: [facility];  Service: Orthopedics;  Laterality: Right;   TOTAL KNEE REVISION Right 05/12/2022   Procedure: Right knee polyethylene versus total knee arthroplasty revision;  Surgeon: Ollen Gross, MD;  Location: [facility];  Service: Orthopedics;  Laterality: Right;   TOTAL VAGINAL HYSTERECTOMY  16 - or age 43   secondary AUB   TUBAL LIGATION     laparosopic BTL age 42   varicose veins Bilateral     Current Outpatient Medications  Medication Sig Dispense Refill   benazepril (LOTENSIN) 40 MG tablet Take 40 mg by mouth daily.      Calcium Carb-Cholecalciferol (CALCIUM 600 + D PO) Take 1 tablet by mouth daily.     cyanocobalamin (VITAMIN B12) 1000 MCG tablet Take 1,000 mcg by mouth daily.     ibandronate (BONIVA) 150 MG tablet Take 150 mg by mouth every 30 (thirty) days. Take in the morning with a full glass of water, on an empty stomach, and do not take anything else by mouth or lie down for the next 30 min.     levothyroxine (SYNTHROID) 125 MCG tablet Take 125 mcg by mouth daily before breakfast.     Magnesium 250 MG TABS Take 500 mg by mouth daily.     METAMUCIL FIBER PO Take 6 capsules by mouth daily.     moxifloxacin  (VIGAMOX) 0.5 % ophthalmic solution Place 1 drop into the left eye 2 (two) times daily.     Multiple Vitamin (MULTIVITAMIN) tablet Take 1 tablet by mouth daily.     No current facility-administered medications for this visit.     ALLERGIES: Penicillins and Sulfa antibiotics  Family History  Problem Relation Age of Onset   Aortic aneurysm Father    Hypertension Father    Coronary artery disease Mother 63   Hypertension Mother    Hyperlipidemia Brother        brain and kidney tumor, no treatment  needed    Aneurysm Brother    Healthy Daughter    Healthy Daughter    Healthy Daughter     Social History   Socioeconomic History   Marital status: Married    Spouse name: Not on file   Number of children: 3   Years of education: 14   Highest education level: Not on file  Occupational History   Occupation: retired  Tobacco Use   Smoking status: Never    Passive exposure: Past   Smokeless tobacco: Never  Vaping Use   Vaping status: Never Used  Substance and Sexual Activity   Alcohol use: No    Alcohol/week: 0.0 standard drinks of alcohol   Drug use: No   Sexual activity: Not Currently    Partners: Male    Birth control/protection: Surgical    Comment: Hyst, less than 5 lifetime partners, sexual debut 20yo, no hx cancer, no hx STIs, no hx DES exposure  Other Topics Concern   Not on file  Social History Narrative   Lives at home with husband . One story home. Right handed   Social Determinants of Health   Financial Resource Strain: Not on file  Food Insecurity: Not on file  Transportation Needs: Not on file  Physical Activity: Not on file  Stress: Not on file  Social Connections: Not on file  Intimate Partner Violence: Not on file    Review of Systems  Genitourinary:  Positive for dysuria, pelvic pain and urgency.    PHYSICAL EXAMINATION:    BP 112/78 (BP Location: Left Arm, Patient Position: Sitting, Cuff Size: Normal)   Pulse (!) 103   Ht 5' 6.5" (1.689 m)    Wt 179 lb (81.2 kg)   LMP 09/13/1984 (Approximate)   SpO2 98%   BMI 28.46 kg/m     General appearance: alert, cooperative and appears stated age   Pelvic: External genitalia:  no lesions              Urethra:  normal appearing urethra with no masses, tenderness or lesions              Bartholins and Skenes: normal                 Vagina: normal appearing vagina with normal color and discharge, no lesions              Cervix: no lesions                Bimanual Exam:  Uterus:  normal size, contour, position, consistency, mobility, non-tender              Adnexa: no mass, fullness, tenderness    Chaperone was present for exam:  Silas Flood  ASSESSMENT  UTI.  Bladder prolapse.  Stress incontinence. Persistent simple left ovarian cyst.   PLAN  Urinalysis:  sg 1.015, ph 6.0, packed with WBC, 10 - 20 RBC, many bacteria.  UC sent.  Will tx with Keflex 500 mg po bid x 7 days.  Previously tolerated without allergic reaction.  Referral to [facility] urogynecology for potential surgical care.  FU here prn.   25 min  total time was spent for this patient encounter, including preparation, face-to-face counseling with the patient, coordination of care, and documentation of the encounter.

## 2023-05-24 NOTE — Telephone Encounter (Signed)
Order placed.  Routing to [employer].   Encounter closed.

## 2023-05-25 LAB — URINALYSIS, COMPLETE W/RFL CULTURE
Bilirubin Urine: NEGATIVE
Glucose, UA: NEGATIVE
Hyaline Cast: NONE SEEN /LPF
Ketones, ur: NEGATIVE
Nitrites, Initial: POSITIVE — AB
Specific Gravity, Urine: 1.015 (ref 1.001–1.035)
pH: 6 (ref 5.0–8.0)

## 2023-05-25 LAB — CULTURE INDICATED

## 2023-05-25 LAB — URINE CULTURE
MICRO NUMBER:: 15438934
SPECIMEN QUALITY:: ADEQUATE

## 2023-06-01 ENCOUNTER — Other Ambulatory Visit: Payer: Self-pay | Admitting: Obstetrics and Gynecology

## 2023-06-01 DIAGNOSIS — Z1231 Encounter for screening mammogram for malignant neoplasm of breast: Secondary | ICD-10-CM

## 2023-06-03 NOTE — Progress Notes (Unsigned)
Office Visit Note  Patient: [name]             Date of Birth: 1945-11-13           MRN: [id number]             PCP: Kaleen Mask, MD Referring: Kaleen Mask, * Visit Date: 06/07/2023 Occupation: @GUAROCC @  Subjective:  No chief complaint on file.   History of Present Illness: [name] is a 77 y.o. female ***     Activities of Daily Living:  Patient reports morning stiffness for *** {minute/hour:19697}.   Patient {ACTIONS;DENIES/REPORTS:21021675::"Denies"} nocturnal pain.  Difficulty dressing/grooming: {ACTIONS;DENIES/REPORTS:21021675::"Denies"} Difficulty climbing stairs: {ACTIONS;DENIES/REPORTS:21021675::"Denies"} Difficulty getting out of chair: {ACTIONS;DENIES/REPORTS:21021675::"Denies"} Difficulty using hands for taps, buttons, cutlery, and/or writing: {ACTIONS;DENIES/REPORTS:21021675::"Denies"}  No Rheumatology ROS completed.   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   Precordial chest pain 12/31/2021   Primary localized osteoarthritis of right knee 10/19/2018   Right foot pain 10/19/2018   Incontinence of feces 09/21/2018   Vulvar lesion 07/20/2018   History of repair of right rotator cuff 03/03/2017   Primary osteoarthritis of both hands/ has had bilateral CMC surgery  03/03/2017   History of hypertension 03/03/2017   History of hyperlipidemia 03/03/2017   DDD (degenerative disc disease), lumbar/ history of lumbar surgery  03/03/2017   Osteopenia of multiple sites 03/03/2017   Chronic left shoulder pain 01/20/2017   Primary osteoarthritis of both knees 01/20/2017   Trapezius muscle spasm 01/20/2017   Insomnia 01/20/2017   Fatigue 01/20/2017   Fibromyalgia 01/20/2017   High risk medications (not anticoagulants) long-term use 01/20/2017   PMR (polymyalgia rheumatica) (HCC) 01/20/2017   Left  ovarian cyst 06/02/2015   Degenerative joint disease, shoulder, right 08/02/2013   Precordial pain 02/11/2012   HTN (hypertension) 02/11/2012   Hyperlipidemia 02/11/2012    Past Medical History:  Diagnosis Date   Bilateral ovarian cysts 09/12/2014   bilateral simple ovarian cysts.  [place] due in January 2018.   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 x 20 yr.   Hyperlipidemia    Hypothyroid    IBS (irritable bowel syndrome)    Impingement syndrome of right shoulder 07/2013   Osteoporosis    Polymyalgia rheumatica (HCC)    5-28  states she has been on prednisone and now is trying to only be on plaquenil to manage the polymyalgia    PONV (postoperative nausea and vomiting)    Primary localized osteoarthritis of right knee    Rupture of right rotator cuff 07/2013   VIN I (vulvar intraepithelial neoplasia I) 2017, 2018   this is from a perineal/vulvar biopsy.   Vitamin B12 deficiency     Family History  Problem Relation Age of Onset   Aortic aneurysm Father    Hypertension Father    Coronary artery disease Mother 30   Hypertension Mother    Hyperlipidemia Brother        brain and kidney tumor, no treatment needed    Aneurysm Brother    Healthy Daughter    Healthy Daughter    Healthy Daughter    Past Surgical History:  Procedure Laterality Date   ANTERIOR LAT LUMBAR FUSION  10/08/2008   L3-4   BACK SURGERY  BACK SURGERY  10-03-14   --Dr. Trey Sailors [facility]   COLONOSCOPY W/ BIOPSIES  11/2009   polyps   COLONOSCOPY W/ BIOPSIES  11/2012   polyp, recheck in 3 years   CYSTO  07/05/2006   CYSTOCELE REPAIR  07/05/2006   ESOPHAGOGASTRODUODENOSCOPY ENDOSCOPY  05/25/05   erythema and edema of gastric lumen   EYE SURGERY Right    muscle adjustment    FOOT SURGERY Right    GANGLION CYST EXCISION Right    wrist   INCONTINENCE SURGERY  07/05/2006   cystourethropexy    ORIF FOREARM FRACTURE Right 09/2012   SHOULDER ARTHROSCOPY WITH ROTATOR CUFF REPAIR AND SUBACROMIAL DECOMPRESSION Right 08/02/2013   Procedure: SHOULDER ARTHROSCOPY WITH ROTATOR CUFF REPAIR AND SUBACROMIAL DECOMPRESSION, EXTENSIVE DEBRIDEMENT (INCLUDING LABRUM), PARTIAL ACROMIPLASY WITH CORACOACROMIAL RELEASE, DISTAL CLAVICULECTOMY;  Surgeon: Loreta Ave, MD;  Location: [facility];  Service: Orthopedics;  Laterality: Right;   STRABISMUS SURGERY Right 01/26/2008   2 other surgeries previously   STRABISMUS SURGERY Right 06/03/2017   Procedure: REPAIR STRABISMUS RIGHT EYE;  Surgeon: Verne Carrow, MD;  Location: [facility];  Service: Ophthalmology;  Laterality: Right;   Thumb surgery  2/13 & ?2010   Bilateral - states a joint was replaced   TOTAL KNEE ARTHROPLASTY Right 02/12/2019   Procedure: TOTAL KNEE ARTHROPLASTY;  Surgeon: Salvatore Marvel, MD;  Location: [facility];  Service: Orthopedics;  Laterality: Right;   TOTAL KNEE REVISION Right 05/12/2022   Procedure: Right knee polyethylene versus total knee arthroplasty revision;  Surgeon: Ollen Gross, MD;  Location: [facility];  Service: Orthopedics;  Laterality: Right;   TOTAL VAGINAL HYSTERECTOMY  83 - or age 46   secondary AUB   TUBAL LIGATION     laparosopic BTL age 74   varicose veins Bilateral    Social History   Social History Narrative   Lives at home with husband . One story home. Right handed   Immunization History  Administered Date(s) Administered   Influenza-Unspecified 08/04/2017   PFIZER(Purple Top)SARS-COV-2 Vaccination 11/15/2019, 12/12/2019, 06/16/2020   Pneumococcal Conjugate-13 05/14/2014, 05/15/2015   Tdap 09/13/2012, 10/19/2022     Objective: Vital Signs: LMP 09/13/1984 (Approximate)    Physical Exam   Musculoskeletal Exam: ***  CDAI Exam: CDAI Score: -- Patient Global: --; Provider Global: -- Swollen: --; Tender: -- Joint Exam 06/07/2023   No joint exam has been documented for  this visit   There is currently no information documented on the homunculus. Go to the Rheumatology activity and complete the homunculus joint exam.  Investigation: No additional findings.  Imaging: No results found.  Recent Labs: Lab Results  Component Value Date   WBC 13.8 (H) 05/13/2022   HGB 10.9 (L) 05/13/2022   PLT 403 (H) 05/13/2022   NA 141 05/13/2022   K 3.9 05/13/2022   CL 112 (H) 05/13/2022   CO2 24 05/13/2022   GLUCOSE 171 (H) 05/13/2022   BUN 13 05/13/2022   CREATININE 0.67 05/13/2022   BILITOT 0.2 06/30/2021   ALKPHOS 73 02/08/2019   AST 20 06/30/2021   ALT 15 06/30/2021   PROT 7.1 06/30/2021   ALBUMIN 4.1 02/08/2019   CALCIUM 8.7 (L) 05/13/2022   GFRAA 103 10/27/2020    Speciality Comments: PLQ Eye Exam: 04/07/2020 WNL @ [facility] Follow up in 1 year  Procedures:  No procedures performed Allergies: Penicillins and Sulfa antibiotics   Assessment / Plan:     Visit Diagnoses: No diagnosis found.  Orders: No  orders of the defined types were placed in this encounter.  No orders of the defined types were placed in this encounter.   Face-to-face time spent with patient was *** minutes. Greater than 50% of time was spent in counseling and coordination of care.  Follow-Up Instructions: No follow-ups on file.   Ellen Henri, CMA  Note - This record has been created using Animal nutritionist.  Chart creation errors have been sought, but may not always  have been located. Such creation errors do not reflect on  the standard of medical care.

## 2023-06-07 ENCOUNTER — Encounter: Payer: Self-pay | Admitting: Physician Assistant

## 2023-06-07 ENCOUNTER — Ambulatory Visit: Payer: Medicare Other | Attending: Rheumatology | Admitting: Physician Assistant

## 2023-06-07 ENCOUNTER — Ambulatory Visit (INDEPENDENT_AMBULATORY_CARE_PROVIDER_SITE_OTHER): Payer: Medicare Other

## 2023-06-07 VITALS — BP 151/85 | HR 73 | Resp 14 | Ht 68.0 in | Wt 182.0 lb

## 2023-06-07 DIAGNOSIS — Z79899 Other long term (current) drug therapy: Secondary | ICD-10-CM | POA: Diagnosis not present

## 2023-06-07 DIAGNOSIS — M25512 Pain in left shoulder: Secondary | ICD-10-CM

## 2023-06-07 DIAGNOSIS — G8929 Other chronic pain: Secondary | ICD-10-CM

## 2023-06-07 DIAGNOSIS — Z96651 Presence of right artificial knee joint: Secondary | ICD-10-CM

## 2023-06-07 DIAGNOSIS — M19041 Primary osteoarthritis, right hand: Secondary | ICD-10-CM

## 2023-06-07 DIAGNOSIS — Z9889 Other specified postprocedural states: Secondary | ICD-10-CM

## 2023-06-07 DIAGNOSIS — M5136 Other intervertebral disc degeneration, lumbar region: Secondary | ICD-10-CM

## 2023-06-07 DIAGNOSIS — R5383 Other fatigue: Secondary | ICD-10-CM

## 2023-06-07 DIAGNOSIS — Z8639 Personal history of other endocrine, nutritional and metabolic disease: Secondary | ICD-10-CM

## 2023-06-07 DIAGNOSIS — M19011 Primary osteoarthritis, right shoulder: Secondary | ICD-10-CM

## 2023-06-07 DIAGNOSIS — Z8679 Personal history of other diseases of the circulatory system: Secondary | ICD-10-CM

## 2023-06-07 DIAGNOSIS — M533 Sacrococcygeal disorders, not elsewhere classified: Secondary | ICD-10-CM

## 2023-06-07 DIAGNOSIS — G4709 Other insomnia: Secondary | ICD-10-CM

## 2023-06-07 DIAGNOSIS — M353 Polymyalgia rheumatica: Secondary | ICD-10-CM | POA: Diagnosis not present

## 2023-06-07 DIAGNOSIS — M8589 Other specified disorders of bone density and structure, multiple sites: Secondary | ICD-10-CM

## 2023-06-07 DIAGNOSIS — M797 Fibromyalgia: Secondary | ICD-10-CM

## 2023-06-07 DIAGNOSIS — M62838 Other muscle spasm: Secondary | ICD-10-CM

## 2023-06-07 DIAGNOSIS — M1712 Unilateral primary osteoarthritis, left knee: Secondary | ICD-10-CM

## 2023-06-07 DIAGNOSIS — M19042 Primary osteoarthritis, left hand: Secondary | ICD-10-CM

## 2023-06-07 MED ORDER — LIDOCAINE HCL 1 % IJ SOLN
1.0000 mL | INTRAMUSCULAR | Status: AC | PRN
Start: 2023-06-07 — End: 2023-06-07
  Administered 2023-06-07: 1 mL

## 2023-06-07 MED ORDER — TRIAMCINOLONE ACETONIDE 40 MG/ML IJ SUSP
40.0000 mg | INTRAMUSCULAR | Status: AC | PRN
Start: 2023-06-07 — End: 2023-06-07
  Administered 2023-06-07: 40 mg via INTRA_ARTICULAR

## 2023-06-30 ENCOUNTER — Ambulatory Visit
Admission: RE | Admit: 2023-06-30 | Discharge: 2023-06-30 | Disposition: A | Discharge status: Home / Self Care | Payer: Medicare Other | Source: Ambulatory Visit | Attending: Obstetrics and Gynecology | Admitting: Obstetrics and Gynecology

## 2023-06-30 DIAGNOSIS — Z1231 Encounter for screening mammogram for malignant neoplasm of breast: Secondary | ICD-10-CM

## 2023-07-03 ENCOUNTER — Telehealth: Payer: Self-pay | Admitting: Obstetrics and Gynecology

## 2023-07-03 NOTE — Telephone Encounter (Signed)
Please check on the status of referral to [facility] urogynecology.   Patient has stress urinary incontinence, bladder prolapse, hx recurrent UTIs, and persistent simple left ovarian cyst.   Thank you.

## 2023-07-04 NOTE — Telephone Encounter (Signed)
Routing to Bianca to f/u on referral.    

## 2023-07-07 NOTE — Telephone Encounter (Signed)
Contacted patient and gave number to referred office for pt to contact to schedule appt.

## 2023-09-01 ENCOUNTER — Ambulatory Visit: Payer: Medicare Other | Admitting: Obstetrics

## 2023-09-01 ENCOUNTER — Encounter: Payer: Self-pay | Admitting: Obstetrics

## 2023-09-01 ENCOUNTER — Other Ambulatory Visit (HOSPITAL_COMMUNITY)
Admission: RE | Admit: 2023-09-01 | Discharge: 2023-09-01 | Disposition: A | Discharge status: Home / Self Care | Payer: Medicare Other | Source: Other Acute Inpatient Hospital | Attending: Obstetrics and Gynecology | Admitting: Obstetrics and Gynecology

## 2023-09-01 VITALS — BP 132/83 | HR 78 | Ht 67.0 in | Wt 181.4 lb

## 2023-09-01 DIAGNOSIS — N83292 Other ovarian cyst, left side: Secondary | ICD-10-CM

## 2023-09-01 DIAGNOSIS — N3946 Mixed incontinence: Secondary | ICD-10-CM

## 2023-09-01 DIAGNOSIS — R159 Full incontinence of feces: Secondary | ICD-10-CM

## 2023-09-01 DIAGNOSIS — R300 Dysuria: Secondary | ICD-10-CM | POA: Insufficient documentation

## 2023-09-01 DIAGNOSIS — R351 Nocturia: Secondary | ICD-10-CM

## 2023-09-01 DIAGNOSIS — Z9889 Other specified postprocedural states: Secondary | ICD-10-CM

## 2023-09-01 DIAGNOSIS — N83202 Unspecified ovarian cyst, left side: Secondary | ICD-10-CM

## 2023-09-01 DIAGNOSIS — N952 Postmenopausal atrophic vaginitis: Secondary | ICD-10-CM

## 2023-09-01 DIAGNOSIS — Z87440 Personal history of urinary (tract) infections: Secondary | ICD-10-CM | POA: Diagnosis not present

## 2023-09-01 DIAGNOSIS — R82998 Other abnormal findings in urine: Secondary | ICD-10-CM | POA: Diagnosis present

## 2023-09-01 LAB — URINALYSIS, ROUTINE W REFLEX MICROSCOPIC
Bilirubin Urine: NEGATIVE
Glucose, UA: NEGATIVE mg/dL
Ketones, ur: NEGATIVE mg/dL
Nitrite: POSITIVE — AB
Protein, ur: 100 mg/dL — AB
Specific Gravity, Urine: 1.015 (ref 1.005–1.030)
WBC, UA: >50 WBC/hpf (ref 0–5)
pH: 5 (ref 5.0–8.0)

## 2023-09-01 LAB — POCT URINALYSIS DIPSTICK
Bilirubin, UA: NEGATIVE
Glucose, UA: NEGATIVE
Ketones, UA: NEGATIVE
Nitrite, UA: POSITIVE
Protein, UA: POSITIVE — AB
Spec Grav, UA: 1.02
Urobilinogen, UA: 0.2 U/dL
pH, UA: 6

## 2023-09-01 MED ORDER — NITROFURANTOIN MONOHYD MACRO 100 MG PO CAPS: 100.0000 mg | ORAL_CAPSULE | Freq: Two times a day (BID) | ORAL | 0 refills | Status: DC

## 2023-09-01 MED ORDER — ESTRADIOL 0.1 MG/GM VA CREA: 0.5000 g | TOPICAL_CREAM | VAGINAL | 3 refills | Status: DC

## 2023-09-01 MED ORDER — CEPHALEXIN 500 MG PO CAPS
500.0000 mg | ORAL_CAPSULE | Freq: Four times a day (QID) | ORAL | 0 refills | Status: AC
Start: 2023-09-01 — End: 2023-09-08

## 2023-09-01 NOTE — Progress Notes (Signed)
New Patient Evaluation and Consultation  Referring Provider: Janean Sark* PCP: Kaleen Mask, MD Date of Service: 09/01/2023  SUBJECTIVE Chief Complaint: New Patient (Initial Visit) [name] is a 77 y.o. female here today for female organ prolapse and stress incontinence./)  History of Present Illness: [name] is a 77 y.o. White or Caucasian female seen in consultation at the request of Dr Ardell Isaacs for evaluation of recurrent UTI, urinary incontinence and history of ovarian cyst.   History of recurrent UTI x 4 started this year Reports knife like stabbing pain during urination with bubbles and cloudy Larey Seat on her back 5 weeks ago  Recurrent asymptomatic pelvic organ prolapse s/p possible Midurethral sling with 2 small suprapubic incisions for urinary leakage by [facility] 07/05/06 Urinary leakage with activity returned around 2 years ago, urgency associated with UTI symptoms UTI symptoms started 1 week ago, takes Ibuprofen PM or Tylenol PM. Tried Azo in the past without relief.  TVH by Dr. Roberto Scales in 1986 at 77yo due to anemia from AUB, denies complications Tried pelvic floor PT with minimal relief Last UTI 05/23/23 with dysuria, urgency, UA + leuk/prot/heme. Culture with pan-sensitive E. Coli. Rx Keflex  01/31/23 UA > 100K pan-sensitive E. Coli  Uses prednisone 2-3x/year when she experience fibromyalgia pain in arms and back, managed by rheumatology S/p R knee revision 05/12/22 and s/p SI join injection last 3 months ago  Incidental ovarian cyst noted on MRI for back pain since 2015, "Right ovarian simple cyst - 14 mm, no vascular features, smooth. Left ovarian simple cyst - 21 mm, no vascular features, smooth." 2016 simple left ovarian cyst 2.23 cm  09/21/18 "Left ovary with 26 mm cyst, echofree and with normal blood flow.  No significant change in size. No free fluid.   09/27/19  "Right ovary with 10 and 5 mm cysts. Left ovary with 2  cysts:  37 x 25 x 31 mm cyst and 7 mm cyst.  Echogenic material.  No abnormal blood flow. 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 There is a small cortical break at the inferior margin of the cyst in the right sacrum which is age indeterminate but new since lumbar spine MRI 06/09/2019. There is no other fracture.   Mild bilateral hip, SI joint and symphysis pubis osteoarthritis. Status post lower lumbar fusion.   Electronically Signed   By: Drusilla Kanner M.D.   On: 12/19/2019 15:10"  10/09/20 "Left ovarian simple cyst, 2.8 mm. Right adnexal cyst 1.4 x 1.0 cm.  09/16/22 Left ovary:  4.23 x 3.49 x 3.33 cm.  3.9 cm simple cyst, avascular.  CA125 - 7 on 09/12/14 CA125 - 8 on 06/02/15 CA125 - 7.8 on 09/27/19 CA125 - 8 on 10/09/20 CA125 - 8 on 09/16/22 No free fluid.  Monitored by Dr. Edward Jolly  History of ovarian cyst in her 3s. 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   Patient is bothered by UI symptoms.  Day time voids 6.  Nocturia: 1-2 times per night to  void with insomnia. Last night got up every 2 hours to void Voiding dysfunction:  does not empty bladder well.  Patient does not use a catheter to empty bladder.  When urinating, patient feels a weak stream Drinks: 64oz water per day, 1 cup of decaf coffee, 1/2 glass orange juice  UTIs: 3 UTI's in the last year.   Denies history of blood in urine, kidney or bladder stones, pyelonephritis, bladder cancer, and kidney cancer No results found for the last 90  days.   Pelvic Organ Prolapse Symptoms:                  Patient Denies a feeling of a bulge the vaginal area.   Bowel Symptom: Bowel movements: 1 time(s) per day with history of IBS Stool consistency: soft  Straining: no.  Splinting: yes after too much fiber or chocolate, more in the past due to chronic constipation Incomplete evacuation: yes.  Patient Admits to accidental bowel leakage / fecal incontinence  Occurs: 2-3 time(s) per year after sweets or chocolate   Consistency with leakage: soft  Bowel regimen: fiber 4 capsule/day Last colonoscopy: Date 25/24 by [facility], Results tubular adenoma pending repeat in 5 years [facility]          Current Care Gaps     Colonoscopy (Every 3 Years) Overdue since 01/26/2016    01/25/2013  [facility] Colonoscopy component of [facility] COLONOSCOPY   Only the first 1 history entries have been loaded, but more history exists.                Sexual Function Sexually active: no.  Sexual orientation: Straight Pain with sex: No, intermittent dryness when she was sexually active  Pelvic Pain Denies pelvic pain  Past Medical History:  Past Medical History:  Diagnosis Date   Bilateral ovarian cysts 09/12/2014   bilateral simple ovarian cysts.  [place] due in January 2018.   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 x 20 yr.   Hyperlipidemia    Hypothyroid    IBS (irritable bowel syndrome)    Impingement syndrome of right shoulder 07/2013   Osteoporosis    Polymyalgia rheumatica (HCC)    5-28  states she has been on prednisone and now is trying to only be on plaquenil to manage the polymyalgia    PONV (postoperative nausea and vomiting)    Primary localized osteoarthritis of right knee    Rupture of right rotator cuff 07/2013   VIN I (vulvar intraepithelial neoplasia I) 2017, 2018   this is from a perineal/vulvar biopsy.   Vitamin B12 deficiency       Past Surgical History:   Past Surgical History:  Procedure Laterality Date   ANTERIOR LAT LUMBAR FUSION  10/08/2008   L3-4   BACK SURGERY     BACK SURGERY  10/03/2014   --Dr. Trey Sailors at [facility]   CATARACT EXTRACTION, BILATERAL     03/2023, 04/2023   COLONOSCOPY W/ BIOPSIES  11/2009   polyps   COLONOSCOPY W/ BIOPSIES  11/2012   polyp, recheck in 3 years   CYSTO  07/05/2006   CYSTOCELE REPAIR  07/05/2006   ESOPHAGOGASTRODUODENOSCOPY ENDOSCOPY  05/25/2005   erythema and edema of gastric lumen   EYE SURGERY Right    muscle adjustment    FOOT SURGERY Right    GANGLION CYST EXCISION Right    wrist  INCONTINENCE SURGERY  07/05/2006   cystourethropexy   ORIF FOREARM FRACTURE Right 09/2012   SHOULDER ARTHROSCOPY WITH ROTATOR CUFF REPAIR AND SUBACROMIAL DECOMPRESSION Right 08/02/2013   Procedure: SHOULDER ARTHROSCOPY WITH ROTATOR CUFF REPAIR AND SUBACROMIAL DECOMPRESSION, EXTENSIVE DEBRIDEMENT (INCLUDING LABRUM), PARTIAL ACROMIPLASY WITH CORACOACROMIAL RELEASE, DISTAL CLAVICULECTOMY;  Surgeon: Loreta Ave, MD;  Location: [facility];  Service: Orthopedics;  Laterality: Right;   STRABISMUS SURGERY Right 01/26/2008   2 other surgeries previously   STRABISMUS SURGERY Right 06/03/2017   Procedure: REPAIR STRABISMUS RIGHT EYE;  Surgeon: Verne Carrow, MD;  Location: [facility];  Service: Ophthalmology;  Laterality: Right;   Thumb surgery  2/13 & ?2010   Bilateral - states a joint was replaced   TOTAL KNEE ARTHROPLASTY Right 02/12/2019   Procedure: TOTAL KNEE ARTHROPLASTY;  Surgeon: Salvatore Marvel, MD;  Location: [facility];  Service: Orthopedics;  Laterality: Right;   TOTAL KNEE REVISION Right 05/12/2022   Procedure: Right knee polyethylene versus total knee arthroplasty revision;  Surgeon: Ollen Gross, MD;  Location: [facility];  Service: Orthopedics;  Laterality: Right;   TOTAL VAGINAL HYSTERECTOMY  89 - or age 66   secondary AUB   TUBAL  LIGATION     laparosopic BTL age 27   varicose veins Bilateral      Past OB/GYN History: OB History  Gravida Para Term Preterm AB Living  3 3 3  0 0 3  SAB IAB Ectopic Multiple Live Births  0 0 0 0 3    # Outcome Date GA Lbr Len/2nd Weight Sex Type Anes PTL Lv  3 Term 81    F Vag-Spont   LIV  2 Term 1969    F Vag-Spont   LIV  1 Term 1968    F Vag-Spont   LIV    Vaginal deliveries: 3, largest infant 7lb13oz Forceps/ Vacuum deliveries: 0, Cesarean section: 0 Menopausal: Yes, at age around 53, Denies vaginal bleeding since menopause Contraception: BTL. Last pap smear.  Any history of abnormal pap smears: no.    Component Value Date/Time   DIAGPAP  07/26/2018 0000    NEGATIVE FOR INTRAEPITHELIAL LESIONS OR MALIGNANCY.   DIAGPAP  07/20/2018 0000    NEGATIVE FOR INTRAEPITHELIAL LESIONS OR MALIGNANCY.   DIAGPAP  06/27/2017 0000    NEGATIVE FOR INTRAEPITHELIAL LESIONS OR MALIGNANCY.   ADEQPAP Satisfactory for evaluation. 07/26/2018 0000   ADEQPAP Satisfactory for evaluation. 07/20/2018 0000   ADEQPAP Satisfactory for evaluation. 06/27/2017 0000    Medications: Patient has a current medication list which includes the following prescription(s): acetaminophen, benazepril, calcium carb-cholecalciferol, cephalexin, cyanocobalamin, [START ON 09/05/2023] estradiol, ibandronate, levothyroxine, magnesium, fiber, moxifloxacin, multivitamin, multiple vitamins-minerals, naproxen sodium, nitrofurantoin (macrocrystal-monohydrate), and prednisone.   Allergies: Patient is allergic to penicillins and sulfa antibiotics.   Social History:  Social History   Tobacco Use   Smoking status: Never    Passive exposure: Past   Smokeless tobacco: Never  Vaping Use   Vaping status: Never Used  Substance Use Topics   Alcohol use: No    Alcohol/week: 0.0 standard drinks of alcohol   Drug use: No    Relationship status: married Patient lives with her spouse.   Patient is not employed. Regular  exercise: Yes: previously doing water exercises, now walking History of abuse: No  Family History:   Family History  Problem Relation Age of Onset   Coronary artery disease Mother 68   Hypertension Mother    Aortic aneurysm Father    Hypertension Father  Hyperlipidemia Brother        brain and kidney tumor, no treatment needed    Aneurysm Brother    Healthy Daughter    Healthy Daughter    Healthy Daughter    Bladder Cancer Neg Hx    Uterine cancer Neg Hx      Review of Systems: Review of Systems  Constitutional:  Positive for malaise/fatigue. Negative for fever and weight loss.       Weight gain  Respiratory:  Negative for cough, shortness of breath and wheezing.   Cardiovascular:  Negative for chest pain, palpitations and leg swelling.  Gastrointestinal:  Negative for abdominal pain and blood in stool.       Leakage  Genitourinary:  Positive for dysuria and frequency (night time). Negative for hematuria and urgency.       Leakage  Skin:  Negative for rash.  Neurological:  Positive for weakness. Negative for dizziness and headaches.  Endo/Heme/Allergies:  Does not bruise/bleed easily.  Psychiatric/Behavioral:  Negative for depression. The patient is not nervous/anxious.      OBJECTIVE Physical Exam: Vitals:   09/01/23 0907  BP: 132/83  Pulse: 78  Weight: 181 lb 6.4 oz (82.3 kg)  Height: 5\' 7"  (1.702 m)    Physical Exam Constitutional:      General: She is not in acute distress.    Appearance: Normal appearance.  Genitourinary:     Bladder and urethral meatus normal.     No lesions in the vagina.     Right Labia: No rash, tenderness, lesions, skin changes or Bartholin's cyst.    Left Labia: No tenderness, lesions, skin changes, Bartholin's cyst or rash.    No vaginal discharge, erythema, tenderness, bleeding, ulceration or granulation tissue.     Anterior vaginal prolapse present.    Severe vaginal atrophy present.     Right Adnexa: not tender, not full  and no mass present.    Left Adnexa: not tender, not full and no mass present.    Cervix is absent.     Uterus is absent.     Urethral meatus caruncle not present.    No urethral prolapse, tenderness, mass, hypermobility, discharge or stress urinary incontinence with cough stress test present.     Bladder is not tender, urgency on palpation not present and masses not present.      Pelvic Floor: Levator muscle strength is 2/5.    Levator ani not tender, obturator internus not tender, no asymmetrical contractions present and no pelvic spasms present.    Symmetrical pelvic sensation, anal wink present and BC reflex present. Cardiovascular:     Rate and Rhythm: Normal rate.  Pulmonary:     Effort: Pulmonary effort is normal. No respiratory distress.  Abdominal:     General: There is no distension.     Palpations: Abdomen is soft. There is no mass.     Tenderness: There is no abdominal tenderness.     Hernia: No hernia is present.    Neurological:     Mental Status: She is alert.  Vitals reviewed. Exam conducted with a chaperone present.      POP-Q:   POP-Q  -1                                            Aa   -1  Ba  -7                                              C   3                                            Gh  4                                            Pb  7                                            tvl   -2                                            Ap  -2                                            Bp                                                 D     Post-Void Residual (PVR) by Bladder Scan: In order to evaluate bladder emptying, we discussed obtaining a postvoid residual and patient agreed to this procedure.  Procedure: The ultrasound unit was placed on the patient's abdomen in the suprapubic region after the patient had voided.    Post Void Residual - 09/01/23 1009       Post Void Residual   Post Void  Residual 100 mL   Cath sample             Straight Catheterization Procedure for PVR: After verbal consent was obtained from the patient for catheterization to assess bladder emptying and residual volume the urethra and surrounding tissues were prepped with betadine and an in and out catheterization was performed.  PVR was .  Urine appeared  cloudy with white sedimentation . The patient tolerated the procedure well.   Laboratory Results: Lab Results  Component Value Date   COLORU yellow 09/01/2023   CLARITYU cloudy 09/01/2023   GLUCOSEUR Negative 09/01/2023   BILIRUBINUR negative 09/01/2023   KETONESU negative 09/01/2023   SPECGRAV 1.020 09/01/2023   RBCUR moderate 09/01/2023   PHUR 6.0 09/01/2023   PROTEINUR Positive (A) 09/01/2023   UROBILINOGEN 0.2 09/01/2023   LEUKOCYTESUR Large (3+) (A) 09/01/2023    Lab Results  Component Value Date   CREATININE 0.67 05/13/2022   CREATININE 0.71 05/04/2022   CREATININE 0.66 12/31/2021    No results found for: "HGBA1C"  Lab Results  Component Value Date   HGB 10.9 (L) 05/13/2022     ASSESSMENT AND PLAN Ms. Glave  is a 77 y.o. with:  1. History of recurrent UTI (urinary tract infection)   2. History of pelvic surgery   3. Urinary incontinence, mixed   4. Incontinence of feces, unspecified fecal incontinence type   5. Dysuria   6. Nocturia   7. Vaginal atrophy   8. Left ovarian cyst     History of recurrent UTI (urinary tract infection) Assessment & Plan: - For treatment of recurrent urinary tract infections, we discussed management of recurrent UTIs including prophylaxis with a daily low dose antibiotic, transvaginal estrogen therapy, D-mannose, and cranberry supplements.  We discussed the role of diagnostic testing such as cystoscopy and upper tract imaging.   - Rx to start low dose vaginal estrogen  - start probiotics - consider antibiotic suppression if recurrent symptoms until 3 months of vaginal  estrogen  Orders: -     Cephalexin; Take 1 capsule (500 mg total) by mouth 4 (four) times daily for 7 days.  Dispense: 28 capsule; Refill: 0 -     Estradiol; Place 0.5 g vaginally 2 (two) times a week. Place 0.5g nightly for two weeks then twice a week after  Dispense: 30 g; Refill: 3 -     Nitrofurantoin Monohyd Macro; Take 1 capsule (100 mg total) by mouth 2 (two) times daily. Start 3 days prior to your urodynamics and cystoscopy  Dispense: 20 capsule; Refill: 0  History of pelvic surgery Assessment & Plan: - possible Midurethral sling with 2 small suprapubic incisions for urinary leakage by [facility] 07/05/06, will need op report - no mesh noted on vaginal exam - recommended urodynamics prior to 3rd line therapies - recommended cystoscopy after treatment of UTI, consider periurethral bulking at the time of cystoscopy to r/o foreign material in the GU tract  Orders: -     Nitrofurantoin Monohyd Macro; Take 1 capsule (100 mg total) by mouth 2 (two) times daily. Start 3 days prior to your urodynamics and cystoscopy  Dispense: 20 capsule; Refill: 0  Urinary incontinence, mixed Assessment & Plan: - baseline stress > urgency in the absence of UTI - history of possible Midurethral sling with 2 small suprapubic incisions for urinary leakage by [facility] 07/05/06 per patient.  - For treatment of stress urinary incontinence,  non-surgical options include expectant management, weight loss, physical therapy, as well as a pessary.  Surgical options include a midurethral sling, Burch urethropexy, and transurethral injection of a bulking agent. - reviewed potential office procedure with urethral bulking (Bulkamid) at the time of cystoscopy. We discussed success rate of approximately 70-80% and possible need for second injection. We reviewed that this is not a permanent procedure and the Bulkamid does dissolve over time. Risks reviewed including injury to bladder or urethra, UTI, urinary  retention and hematuria.  - We discussed the symptoms of overactive bladder (OAB), which include urinary urgency, urinary frequency, nocturia, with or without urge incontinence.  While we do not know the exact etiology of OAB, several treatment options exist. We discussed management including behavioral therapy (decreasing bladder irritants, urge suppression strategies, timed voids, bladder retraining), physical therapy, medication; for refractory cases posterior tibial nerve stimulation, sacral neuromodulation, and intravesical botulinum toxin injection.  For anticholinergic medications, we discussed the potential side effects of anticholinergics including dry eyes, dry mouth, constipation, cognitive impairment and urinary retention. For Beta-3 agonist medication, we discussed the potential side effect of elevated blood pressure which is more likely to occur in individuals with uncontrolled hypertension. - will reassess after treatment of UTI -  pending urodynamics - encouraged Kegel exercises  Orders: -     Estradiol; Place 0.5 g vaginally 2 (two) times a week. Place 0.5g nightly for two weeks then twice a week after  Dispense: 30 g; Refill: 3  Incontinence of feces, unspecified fecal incontinence type  Dysuria Assessment & Plan: - symptoms for 1 week with history of pansensitive E. Coli rUTI - denies fever, N/V, unilateral back pain, or hematuria - Rx Keflex 500mg  QID for 7 days for complicated UTI.  - patient to call if symptoms worsens or persistent after Keflex  Orders: -     Cephalexin; Take 1 capsule (500 mg total) by mouth 4 (four) times daily for 7 days.  Dispense: 28 capsule; Refill: 0 -     Estradiol; Place 0.5 g vaginally 2 (two) times a week. Place 0.5g nightly for two weeks then twice a week after  Dispense: 30 g; Refill: 3 -     POCT urinalysis dipstick -     Urine Culture; Future -     Urine Microscopic; Future -     Nitrofurantoin Monohyd Macro; Take 1 capsule (100 mg  total) by mouth 2 (two) times daily. Start 3 days prior to your urodynamics and cystoscopy  Dispense: 20 capsule; Refill: 0  Nocturia Assessment & Plan: - avoid fluid intake after 6pm - denies lower extremity swelling - reports snoring, consider workup to r/o sleep apnea    Vaginal atrophy Assessment & Plan: - For symptomatic vaginal atrophy options include lubrication with a water-based lubricant, personal hygiene measures and barrier protection against wetness, and estrogen replacement in the form of vaginal cream, vaginal tablets, or a time-released vaginal ring.   - Rx low dose vaginal estrogen  Orders: -     Estradiol; Place 0.5 g vaginally 2 (two) times a week. Place 0.5g nightly for two weeks then twice a week after  Dispense: 30 g; Refill: 3  Left ovarian cyst Assessment & Plan: - normal CA-125, 3.9cm simple cyst followed by yearly TVUS since 2015 - incidental finding on MRI for back pain with known large Tarlov cyst  - pending repeat TVUS  - consider MRI   Time spent: I spent 78 minutes dedicated to the care of this patient on the date of this encounter to include pre-visit review of records, face-to-face time with the patient discussing recurrent UTI nocturia, recurrent mixed urinary incontinence, vaginal atrophy, nocturia, history of anti-incontinence surgery, left ovarian cyst, and post visit documentation and ordering medication/ testing outside of catheterization.    Loleta Chance, MD

## 2023-09-01 NOTE — Assessment & Plan Note (Addendum)
-   catheterized for with no sign or symptom of retention - POCT + leuk/protein/heme. Rx Keflex 500mg  QID for presumed complicated UTI - For treatment of recurrent urinary tract infections, we discussed management of recurrent UTIs including prophylaxis with a daily low dose antibiotic, transvaginal estrogen therapy, D-mannose, and cranberry supplements.  We discussed the role of diagnostic testing such as cystoscopy and upper tract imaging.   - Rx to start low dose vaginal estrogen  - start probiotics - consider antibiotic suppression if recurrent symptoms until 3 months of vaginal estrogen

## 2023-09-01 NOTE — Assessment & Plan Note (Addendum)
-   baseline stress > urgency in the absence of UTI - history of possible Midurethral sling with 2 small suprapubic incisions for urinary leakage by [facility] 07/05/06 per patient.  - For treatment of stress urinary incontinence,  non-surgical options include expectant management, weight loss, physical therapy, as well as a pessary.  Surgical options include a midurethral sling, Burch urethropexy, and transurethral injection of a bulking agent. - reviewed potential office procedure with urethral bulking (Bulkamid) at the time of cystoscopy. We discussed success rate of approximately 70-80% and possible need for second injection. We reviewed that this is not a permanent procedure and the Bulkamid does dissolve over time. Risks reviewed including injury to bladder or urethra, UTI, urinary retention and hematuria.  - We discussed the symptoms of overactive bladder (OAB), which include urinary urgency, urinary frequency, nocturia, with or without urge incontinence.  While we do not know the exact etiology of OAB, several treatment options exist. We discussed management including behavioral therapy (decreasing bladder irritants, urge suppression strategies, timed voids, bladder retraining), physical therapy, medication; for refractory cases posterior tibial nerve stimulation, sacral neuromodulation, and intravesical botulinum toxin injection.  For anticholinergic medications, we discussed the potential side effects of anticholinergics including dry eyes, dry mouth, constipation, cognitive impairment and urinary retention. For Beta-3 agonist medication, we discussed the potential side effect of elevated blood pressure which is more likely to occur in individuals with uncontrolled hypertension. - will reassess after treatment of UTI - pending urodynamics - encouraged Kegel exercises

## 2023-09-01 NOTE — Assessment & Plan Note (Signed)
-   normal CA-125, 3.9cm simple cyst followed by yearly TVUS since 2015 - incidental finding on MRI for back pain with known large Tarlov cyst  - pending repeat TVUS  - consider MRI

## 2023-09-01 NOTE — Assessment & Plan Note (Signed)
-   avoid fluid intake after 6pm - denies lower extremity swelling - reports snoring, consider workup to r/o sleep apnea

## 2023-09-01 NOTE — Patient Instructions (Addendum)
For treatment of recurrent urinary tract infections, we discussed management of recurrent UTIs including prophylaxis with a daily low dose antibiotic, transvaginal estrogen therapy, D-mannose, and cranberry supplements.  We discussed the role of diagnostic testing such as cystoscopy and upper tract imaging.     Take Keflex 500mg  4 times a day for 7 days, please call if you experience fever > 100.4, nausea/vomiting, one sided back pain or worsening symptoms.  For vaginal atrophy (thinning of the vaginal tissue that can cause dryness and burning) and UTI prevention we discussed estrogen replacement in the form of vaginal cream.   Start vaginal estrogen therapy nightly for two weeks then 2 times weekly at night. This can be placed with your finger or an applicator inside the vagina and around the urethra.  Please let us know if the prescription is too expensive and we can look for alternative options.   Is vaginal estrogen therapy safe for me? Vaginal estrogen preparations act on the vaginal skin, and only a very tiny amount is absorbed into the bloodstream (0.01%).  They work in a similar way to hand or face cream.  There is minimal absorption and they are therefore perfectly safe. If you have had breast cancer and have persistent troublesome symptoms which aren't settling with vaginal moisturisers and lubricants, local estrogen treatment may be a possibility, but consultation with your oncologist should take place first.   For night time frequency: - avoid fluid intake after 7pm  For treatment of stress urinary incontinence, which is leakage with physical activity/movement/strainging/coughing, we discussed expectant management versus nonsurgical options versus surgery. Nonsurgical options include weight loss, physical therapy, as well as a pessary.  Surgical options include a midurethral sling, which is a synthetic mesh sling that acts like a hammock under the urethra to prevent leakage of urine, a  Burch urethropexy, and transurethral injection of a bulking agent.   We discussed the symptoms of overactive bladder (OAB), which include urinary urgency, urinary frequency, nocturia, with or without urge incontinence.  While we do not know the exact etiology of OAB, several treatment options exist. We discussed management including behavioral therapy (decreasing bladder irritants, urge suppression strategies, timed voids, bladder retraining), physical therapy, medication; for refractory cases posterior tibial nerve stimulation, sacral neuromodulation, and intravesical botulinum toxin injection.  For anticholinergic medications, we discussed the potential side effects of anticholinergics including dry eyes, dry mouth, constipation, cognitive impairment and urinary retention. For Beta-3 agonist medication, we discussed the potential side effect of elevated blood pressure which is more likely to occur in individuals with uncontrolled hypertension.  Here's a look at how much dietary fiber is found in some common foods. When buying packaged foods, check the Nutrition Facts label for fiber content. It can vary among brands.  Fruits Serving size Total fiber (grams)*  Raspberries 1 cup 8.0  Pear 1 medium 5.5  Apple, with skin 1 medium 4.5  Banana 1 medium 3.0  Orange 1 medium 3.0  Strawberries 1 cup 3.0   Vegetables Serving size Total fiber (grams)*  Green peas, boiled 1 cup 9.0  Broccoli, boiled 1 cup chopped 5.0  Turnip greens, boiled 1 cup 5.0  Brussels sprouts, boiled 1 cup 4.0  Potato, with skin, baked 1 medium 4.0  Sweet corn, boiled 1 cup 3.5  Cauliflower, raw 1 cup chopped 2.0  Carrot, raw 1 medium 1.5   Grains Serving size Total fiber (grams)*  Spaghetti, whole-wheat, cooked 1 cup 6.0  Barley, pearled, cooked 1 cup 6.0  Bran flakes  3/4 cup 5.5  Quinoa, cooked 1 cup 5.0  Oat bran muffin 1 medium 5.0  Oatmeal, instant, cooked 1 cup 5.0  Popcorn, air-popped 3 cups 3.5  Brown rice,  cooked 1 cup 3.5  Bread, whole-wheat 1 slice 2.0  Bread, rye 1 slice 2.0   Legumes, nuts and seeds Serving size Total fiber (grams)*  Split peas, boiled 1 cup 16.0  Lentils, boiled 1 cup 15.5  Black beans, boiled 1 cup 15.0  Baked beans, canned 1 cup 10.0  Chia seeds 1 ounce 10.0  Almonds 1 ounce (23 nuts) 3.5  Pistachios 1 ounce (49 nuts) 3.0  Sunflower kernels 1 ounce 3.0  *Rounded to nearest 0.5 gram. Source: [employer] for [employer]	[place]

## 2023-09-01 NOTE — Assessment & Plan Note (Signed)
-   symptoms for 1 week with history of pansensitive E. Coli rUTI - denies fever, N/V, unilateral back pain, or hematuria - Rx Keflex 500mg  QID for 7 days for complicated UTI.  - patient to call if symptoms worsens or persistent after Keflex

## 2023-09-01 NOTE — Assessment & Plan Note (Signed)
-   possible Midurethral sling with 2 small suprapubic incisions for urinary leakage by [facility] 07/05/06, will need op report - no mesh noted on vaginal exam - recommended urodynamics prior to 3rd line therapies - recommended cystoscopy after treatment of UTI, consider periurethral bulking at the time of cystoscopy to r/o foreign material in the GU tract

## 2023-09-01 NOTE — Assessment & Plan Note (Addendum)
-   For symptomatic vaginal atrophy options include lubrication with a water-based lubricant, personal hygiene measures and barrier protection against wetness, and estrogen replacement in the form of vaginal cream, vaginal tablets, or a time-released vaginal ring.   - Rx low dose vaginal estrogen

## 2023-09-04 LAB — URINE CULTURE: Culture: >=100000 — AB

## 2023-09-09 NOTE — Progress Notes (Signed)
 GYNECOLOGY  VISIT   HPI: 77 y.o.   Married  Caucasian female   [phone] with Patient's last menstrual period was 09/13/1984 (approximate).   here for: U/S consult follow up of left ovarian cyst.   Last US  09/16/22: Pelvic US   Uterus absent.  Normal vaginal cuff.  Left ovary:  4.23 x 3.49 x 3.33 cm.  3.9 cm simple cyst, avascular.  Right ovary:  1.84 x 1.27 x 1.65 cm.  Normal.  No adnexal masses.  No free fluid.   CA125s have always been normal.  Last CA125 8 on 09/16/22.  Saw Urogyn for cystocele and recurrent UTI.  Will do cytoscopy.  Started vaginal estrogen cream also. No current plan for surgical care.   Has chronic insomnia and needs help with this.  She would like to see a specialist and do a potential sleep study.  GYNECOLOGIC HISTORY: Patient's last menstrual period was 09/13/1984 (approximate). Contraception:  hyst Menopausal hormone therapy:  estrace  Last 2 paps:  07/26/18 neg, 07/20/18 neg History of abnormal Pap or positive HPV:  no Mammogram:  06/30/23 Breast Density Cat B, BI-RADS CAT 1 neg        OB History     Gravida  3   Para  3   Term  3   Preterm  0   AB  0   Living  3      SAB  0   IAB  0   Ectopic  0   Multiple  0   Live Births  3              Patient Active Problem List   Diagnosis Date Noted   History of recurrent UTI (urinary tract infection) 09/01/2023   History of pelvic surgery 09/01/2023   Urinary incontinence, mixed 09/01/2023   Dysuria 09/01/2023   Nocturia 09/01/2023   Vaginal atrophy 09/01/2023   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   Precordial chest pain 12/31/2021   Primary localized osteoarthritis of right knee 10/19/2018   Right foot pain 10/19/2018   Incontinence of feces 09/21/2018   Vulvar lesion 07/20/2018   History of repair of right rotator cuff 03/03/2017   Primary osteoarthritis of both hands/ has had bilateral [facility] surgery   03/03/2017   History of hypertension 03/03/2017   History of hyperlipidemia 03/03/2017   DDD (degenerative disc disease), lumbar/ history of lumbar surgery  03/03/2017   Osteopenia of multiple sites 03/03/2017   Chronic left shoulder pain 01/20/2017   Primary osteoarthritis of both knees 01/20/2017   Trapezius muscle spasm 01/20/2017   Insomnia 01/20/2017   Fatigue 01/20/2017   Fibromyalgia 01/20/2017   High risk medications (not anticoagulants) long-term use 01/20/2017   PMR (polymyalgia rheumatica) (HCC) 01/20/2017   Left ovarian cyst 06/02/2015   Degenerative joint disease, shoulder, right 08/02/2013   Precordial pain 02/11/2012   HTN (hypertension) 02/11/2012   Hyperlipidemia 02/11/2012    Past Medical History:  Diagnosis Date   Bilateral ovarian cysts 09/12/2014   bilateral simple ovarian cysts.  US  due in January 2018.   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 x 20 yr.   Hyperlipidemia    Hypothyroid    IBS (irritable bowel syndrome)    Impingement syndrome of right shoulder 07/2013   Osteoporosis    Polymyalgia rheumatica (HCC)  5-28  states she has been on prednisone  and now is trying to only be on plaquenil  to manage the polymyalgia    PONV (postoperative nausea and vomiting)    Primary localized osteoarthritis of right knee    Rupture of right rotator cuff 07/2013   VIN I (vulvar intraepithelial neoplasia I) 2017, 2018   this is from a perineal/vulvar biopsy.   Vitamin B12 deficiency     Past Surgical History:  Procedure Laterality Date   ANTERIOR LAT LUMBAR FUSION  10/08/2008   L3-4   BACK SURGERY     BACK SURGERY  10/03/2014   --Dr. Oneil Carwin [facility]   CATARACT EXTRACTION, BILATERAL     03/2023, 04/2023   COLONOSCOPY W/ BIOPSIES  11/2009   polyps   COLONOSCOPY W/ BIOPSIES  11/2012   polyp, recheck in 3 years   CYSTO  07/05/2006   CYSTOCELE  REPAIR  07/05/2006   ESOPHAGOGASTRODUODENOSCOPY ENDOSCOPY  05/25/2005   erythema and edema of gastric lumen   EYE SURGERY Right    muscle adjustment    FOOT SURGERY Right    GANGLION CYST EXCISION Right    wrist   INCONTINENCE SURGERY  07/05/2006   cystourethropexy   ORIF FOREARM FRACTURE Right 09/2012   SHOULDER ARTHROSCOPY WITH ROTATOR CUFF REPAIR AND SUBACROMIAL DECOMPRESSION Right 08/02/2013   Procedure: SHOULDER ARTHROSCOPY WITH ROTATOR CUFF REPAIR AND SUBACROMIAL DECOMPRESSION, EXTENSIVE DEBRIDEMENT (INCLUDING LABRUM), PARTIAL ACROMIPLASY WITH CORACOACROMIAL RELEASE, DISTAL CLAVICULECTOMY;  Surgeon: Toribio JULIANNA Chancy, MD;  Location: [facility];  Service: Orthopedics;  Laterality: Right;   STRABISMUS SURGERY Right 01/26/2008   2 other surgeries previously   STRABISMUS SURGERY Right 06/03/2017   Procedure: REPAIR STRABISMUS RIGHT EYE;  Surgeon: Neysa Fallow, MD;  Location:  SURGERY CENTER;  Service: Ophthalmology;  Laterality: Right;   Thumb surgery  2/13 & ?2010   Bilateral - states a joint was replaced   TOTAL KNEE ARTHROPLASTY Right 02/12/2019   Procedure: TOTAL KNEE ARTHROPLASTY;  Surgeon: Jane Charleston, MD;  Location: [facility];  Service: Orthopedics;  Laterality: Right;   TOTAL KNEE REVISION Right 05/12/2022   Procedure: Right knee polyethylene versus total knee arthroplasty revision;  Surgeon: Melodi Lerner, MD;  Location: [facility];  Service: Orthopedics;  Laterality: Right;   TOTAL VAGINAL HYSTERECTOMY  40 - or age 67   secondary AUB   TUBAL LIGATION     laparosopic BTL age 6   varicose veins Bilateral     Current Outpatient Medications  Medication Sig Dispense Refill   Acetaminophen  (TYLENOL  PO) Take by mouth as needed.     benazepril (LOTENSIN) 40 MG tablet Take 40 mg by mouth daily.      Calcium Carb-Cholecalciferol (CALCIUM 600 + D PO) Take 1 tablet by mouth daily.     cyanocobalamin (VITAMIN B12) 1000 MCG tablet Take 1,000 mcg by mouth  daily.     estradiol  (ESTRACE ) 0.1 MG/GM vaginal cream Place 0.5 g vaginally 2 (two) times a week. Place 0.5g nightly for two weeks then twice a week after 30 g 3   ibandronate (BONIVA) 150 MG tablet Take 150 mg by mouth every 30 (thirty) days. Take in the morning with a full glass of water , on an empty stomach, and do not take anything else by mouth or lie down for the next 30 min.     levothyroxine  (SYNTHROID ) 125 MCG tablet Take 125 mcg by mouth daily before breakfast.     Magnesium 250 MG TABS Take 500  mg by mouth daily.     METAMUCIL FIBER PO Take 6 capsules by mouth daily.     moxifloxacin (VIGAMOX) 0.5 % ophthalmic solution Place 1 drop into the left eye 2 (two) times daily.     Multiple Vitamin (MULTIVITAMIN) tablet Take 1 tablet by mouth daily.     Multiple Vitamins-Minerals (PRESERVISION AREDS PO) Take by mouth daily.     Naproxen Sodium (ALEVE PO) Take by mouth as needed.     nitrofurantoin , macrocrystal-monohydrate, (MACROBID ) 100 MG capsule Take 1 capsule (100 mg total) by mouth 2 (two) times daily. Start 3 days prior to your urodynamics and cystoscopy 20 capsule 0   predniSONE  (DELTASONE ) 5 MG tablet Take 5 mg by mouth as needed. As needed for flare ups     No current facility-administered medications for this visit.     ALLERGIES: Penicillins and Sulfa antibiotics  Family History  Problem Relation Age of Onset   Coronary artery disease Mother 43   Hypertension Mother    Aortic aneurysm Father    Hypertension Father    Hyperlipidemia Brother        brain and kidney tumor, no treatment needed    Aneurysm Brother    Healthy Daughter    Healthy Daughter    Healthy Daughter    Bladder Cancer Neg Hx    Uterine cancer Neg Hx     Social History   Socioeconomic History   Marital status: Married    Spouse name: Not on file   Number of children: 3   Years of education: 14   Highest education level: Not on file  Occupational History   Occupation: retired  Tobacco Use    Smoking status: Never    Passive exposure: Past   Smokeless tobacco: Never  Vaping Use   Vaping status: Never Used  Substance and Sexual Activity   Alcohol use: No    Alcohol/week: 0.0 standard drinks of alcohol   Drug use: No   Sexual activity: Not Currently    Partners: Male    Birth control/protection: Surgical    Comment: Hyst, less than 5 lifetime partners, sexual debut 20yo, no hx cancer, no hx STIs, no hx DES exposure  Other Topics Concern   Not on file  Social History Narrative   Lives at home with husband . One story home. Right handed   Social Drivers of Corporate Investment Banker Strain: Not on file  Food Insecurity: Not on file  Transportation Needs: Not on file  Physical Activity: Not on file  Stress: Not on file  Social Connections: Not on file  Intimate Partner Violence: Not on file    Review of Systems  All other systems reviewed and are negative.   PHYSICAL EXAMINATION:   BP 122/76 (BP Location: Right Arm, Patient Position: Sitting, Cuff Size: Small)   Pulse 70   Ht 5' 7 (1.702 m)   Wt 181 lb (82.1 kg)   LMP 09/13/1984 (Approximate)   SpO2 98%   BMI 28.35 kg/m     General appearance: alert, cooperative and appears stated age  Pelvic US  Uterus:  absent.  Left ovary:  4.2 x 3.97 x 3.30 cm.  Simple ovarian cyst 3.39 cm, avascular.  Right ovary:  2.26 x 1.55 x 1.46 cm. 1.8 cm simple avascular cyst noted adjacent to the ovary.  No free fluid.   ASSESSMENT:  Stable simple left ovarian cyst.  Small left paraovarian versus paratubal cyst.   Hx bilateral ovarian cysts.  Left ovarian/adnexal cyst has been seen on prior imaging but not the last pelvic US .  Benign appearance of cysts. Insomnia.   PLAN:  Pelvic ultrasound images and report reviewed.  No CA125 needed.  Patient is comfortable with this.  Yearly pelvic US .  Return for breast and pelvic exam in Feb 2026. Referral to neurology for insomnia.  FU prn.

## 2023-09-22 ENCOUNTER — Encounter: Payer: Self-pay | Admitting: Obstetrics and Gynecology

## 2023-09-22 ENCOUNTER — Ambulatory Visit: Payer: Medicare Other

## 2023-09-22 ENCOUNTER — Ambulatory Visit: Payer: Medicare Other | Admitting: Obstetrics and Gynecology

## 2023-09-22 VITALS — BP 122/76 | HR 70 | Ht 67.0 in | Wt 181.0 lb

## 2023-09-22 DIAGNOSIS — G4700 Insomnia, unspecified: Secondary | ICD-10-CM | POA: Diagnosis not present

## 2023-09-22 DIAGNOSIS — N83202 Unspecified ovarian cyst, left side: Secondary | ICD-10-CM

## 2023-09-22 DIAGNOSIS — N83201 Unspecified ovarian cyst, right side: Secondary | ICD-10-CM | POA: Diagnosis not present

## 2023-10-05 ENCOUNTER — Ambulatory Visit (INDEPENDENT_AMBULATORY_CARE_PROVIDER_SITE_OTHER): Payer: Medicare Other | Admitting: Obstetrics and Gynecology

## 2023-10-05 ENCOUNTER — Ambulatory Visit: Payer: Medicare Other | Admitting: Obstetrics

## 2023-10-05 VITALS — BP 155/94 | HR 70

## 2023-10-05 DIAGNOSIS — N3946 Mixed incontinence: Secondary | ICD-10-CM

## 2023-10-05 DIAGNOSIS — N393 Stress incontinence (female) (male): Secondary | ICD-10-CM

## 2023-10-05 DIAGNOSIS — R948 Abnormal results of function studies of other organs and systems: Secondary | ICD-10-CM

## 2023-10-05 LAB — POCT URINALYSIS DIPSTICK
Bilirubin, UA: NEGATIVE
Glucose, UA: NEGATIVE
Ketones, UA: NEGATIVE
Leukocytes, UA: NEGATIVE
Nitrite, UA: NEGATIVE
Protein, UA: NEGATIVE
Spec Grav, UA: 1.015 (ref 1.010–1.025)
Urobilinogen, UA: 0.2 U/dL
pH, UA: 5.5 (ref 5.0–8.0)

## 2023-10-05 NOTE — Progress Notes (Addendum)
[facility] Urodynamics Procedure  Referring Physician: Kaleen Mask, * Date of Procedure: 10/05/2023  [name] is a 78 y.o. female who presents for urodynamic evaluation. Indication(s) for study: mixed incontinence  Vital Signs: BP (!) 155/94   Pulse 70   LMP 09/13/1984 (Approximate)   Laboratory Results: A catheterized urine specimen revealed:  Lab Results  Component Value Date   COLORU dark yellow 10/05/2023   CLARITYU clear 10/05/2023   GLUCOSEUR Negative 10/05/2023   BILIRUBINUR negative 10/05/2023   KETONESU negative 10/05/2023   SPECGRAV 1.015 10/05/2023   RBCUR Trace intact 10/05/2023   PHUR 5.5 10/05/2023   PROTEINUR Negative 10/05/2023   UROBILINOGEN 0.2 10/05/2023   LEUKOCYTESUR Negative 10/05/2023      Voiding Diary: Deferred   Procedure Timeout:  The correct patient was verified and the correct procedure was verified. The patient was in the correct position and safety precautions were reviewed based on at the patient's history.  Urodynamic Procedure A 11F dual lumen urodynamics catheter was placed under sterile conditions into the patient's bladder. A 11F catheter was placed into the rectum in order to measure abdominal pressure. EMG patches were placed in the appropriate position.  All connections were confirmed and calibrations/adjusted made. Saline was instilled into the bladder through the dual lumen catheters.  Cough/valsalva pressures were measured periodically during filling.  Patient was allowed to void.  The bladder was then emptied of its residual.  UROFLOW: Revealed a Qmax of 17 mL/sec.  She voided 210 mL and had a residual of 300 mL.  It was a prolonged normal pattern and represented normal habits.   CMG: This was performed with sterile water in the sitting position at a fill rate of 30-40 mL/min.    First sensation of fullness was 28 mLs,  First urge was 141 mLs,  Strong urge was 220 mLs and  Capacity was 482  mLs  Stress incontinence was demonstrated Highest positive Barrier CLPP was 84 cmH20 at 201 ml. Highest positive Barrier VLPP was 20 cmH20 at 201 ml.  Detrusor function was normal, with no phasic contractions seen.    Compliance:  Normal. End fill detrusor pressure was 7.5cmH20.  Calculated compliance was 64.36mL/cmH20  UPP: MUCP with barrier reduction was 29 cm of water.    MICTURITION STUDY: Voiding was performed with reduction using scopettes in the sitting position.  Pdet at Qmax was 13 cm of water.  Qmax was 10 mL/sec.  It was a interrupted pattern.  She voided 207 mL and had a residual of 275 mL.  It was a volitional void, sustained detrusor contraction was present and abdominal straining was present  EMG: This was performed with patches.  She had voluntary contractions, recruitment with fill was present and urethral sphincter was not relaxed with void.  The details of the procedure with the study tracings have been scanned into EPIC.   Urodynamic Impression:  1. Sensation was increased; capacity was normal 2. Stress Incontinence was demonstrated at ISD range pressures; 3. Detrusor Overactivity was not demonstrated. 4. Emptying was dysfunctional with a elevated PVR ( ), a sustained detrusor contraction present,  abdominal straining present, dyssynergic urethral sphincter activity on EMG.  Plan: - The patient will follow up  to discuss the findings and treatment options.  -We discussed she may need a stepwise approach to treatment due to her incomplete bladder emptying. We discussed she is most likely having overflow incontinence as well as SUI. We briefly discussed a pessary and patient reported she  would rather have surgery.

## 2023-10-12 ENCOUNTER — Other Ambulatory Visit (HOSPITAL_COMMUNITY)
Admission: RE | Admit: 2023-10-12 | Discharge: 2023-10-12 | Disposition: A | Discharge status: Home / Self Care | Payer: Medicare Other | Attending: Obstetrics | Admitting: Obstetrics

## 2023-10-12 ENCOUNTER — Ambulatory Visit: Payer: Medicare Other | Admitting: Obstetrics

## 2023-10-12 ENCOUNTER — Encounter: Payer: Self-pay | Admitting: Obstetrics

## 2023-10-12 VITALS — BP 160/91 | HR 70

## 2023-10-12 DIAGNOSIS — Z4689 Encounter for fitting and adjustment of other specified devices: Secondary | ICD-10-CM | POA: Insufficient documentation

## 2023-10-12 DIAGNOSIS — N3946 Mixed incontinence: Secondary | ICD-10-CM

## 2023-10-12 DIAGNOSIS — Z87440 Personal history of urinary (tract) infections: Secondary | ICD-10-CM | POA: Diagnosis not present

## 2023-10-12 DIAGNOSIS — Z9889 Other specified postprocedural states: Secondary | ICD-10-CM | POA: Diagnosis not present

## 2023-10-12 DIAGNOSIS — R3129 Other microscopic hematuria: Secondary | ICD-10-CM | POA: Diagnosis not present

## 2023-10-12 DIAGNOSIS — R159 Full incontinence of feces: Secondary | ICD-10-CM

## 2023-10-12 DIAGNOSIS — R351 Nocturia: Secondary | ICD-10-CM

## 2023-10-12 DIAGNOSIS — N952 Postmenopausal atrophic vaginitis: Secondary | ICD-10-CM

## 2023-10-12 LAB — URINALYSIS, ROUTINE W REFLEX MICROSCOPIC
Bilirubin Urine: NEGATIVE
Glucose, UA: NEGATIVE mg/dL
Hgb urine dipstick: NEGATIVE
Ketones, ur: NEGATIVE mg/dL
Leukocytes,Ua: NEGATIVE
Nitrite: NEGATIVE
Protein, ur: NEGATIVE mg/dL
Specific Gravity, Urine: 1.016 (ref 1.005–1.030)
pH: 6 (ref 5.0–8.0)

## 2023-10-12 LAB — POCT URINALYSIS DIPSTICK
Bilirubin, UA: NEGATIVE
Glucose, UA: NEGATIVE
Ketones, UA: NEGATIVE
Protein, UA: NEGATIVE
Spec Grav, UA: 1.02 (ref 1.010–1.025)
Urobilinogen, UA: 0.2 U/dL
pH, UA: 6 (ref 5.0–8.0)

## 2023-10-12 NOTE — Assessment & Plan Note (Signed)
-   For symptomatic vaginal atrophy options include lubrication with a water-based lubricant, personal hygiene measures and barrier protection against wetness, and estrogen replacement in the form of vaginal cream, vaginal tablets, or a time-released vaginal ring.   - continue low dose vaginal estrogen 2x/week, advised to apply at vaginal opening due to atrophy with bleeding during pessary fitting

## 2023-10-12 NOTE — Assessment & Plan Note (Signed)
-   09/01/23 UA + leuk/nit/protein/heme, urine culture >100K Klebsiella pneumoniae resistant to ampicillin and macrobid.  Rx Keflex - repeat 10/05/23 POCT UA for UDS + heme - pending repeat today - scheduled cystoscopy - ordered CT urogram - Cr 0.67 in 2023

## 2023-10-12 NOTE — Assessment & Plan Note (Signed)
-   Sparc Midurethral sling by Dr. Sherron Monday in 07/05/06 - no mesh noted on vaginal exam - urinary retention on urodynamics, etiology includes outlet obstruction from pelvic organ prolapse vs.  Midurethral sling vs. Pelvic floor dyssynergia - recommended cystoscopy after treatment of UTI to r/o foreign material in the GU tract - consider urethral bulking if PVR returns to normal

## 2023-10-12 NOTE — Assessment & Plan Note (Signed)
-   failed size 2 and 3 ring with support (descent with valsalva, 2 and 3 donut (pain with insertion), size 2 and 3 gelhorn pessary (descent)  - trial of size 3 cube pessary, pt to return by 4pm if discomfort - repeat PVR

## 2023-10-12 NOTE — Patient Instructions (Addendum)
Please call and schedule your pelvic floor physical therapy appointment.   Continue vaginal estrogen twice a week.   You are opting to try a size 3 cube pessary. This will keep the bulge inside and prevent it from getting worse. We will reassess your symptoms in 2 weeks.  Please return by 4pm today if you experience discomfort or inability to void or pass a bowel movements.  We have scheduled you for a cystoscopy.   You should receive a call from sleep medicine for sleep study to rule out sleep apnea.

## 2023-10-12 NOTE — Assessment & Plan Note (Signed)
-   avoid fluid intake after 6pm - denies lower extremity swelling - reports snoring, sleep study to r/o sleep apnea

## 2023-10-12 NOTE — Assessment & Plan Note (Addendum)
-   baseline stress > urgency in the absence of UTI - history of Sparc Midurethral sling by Dr. Sherron Monday 07/05/06   - For treatment of stress urinary incontinence,  non-surgical options include expectant management, weight loss, physical therapy, as well as a pessary.  Surgical options include a midurethral sling, Burch urethropexy, and transurethral injection of a bulking agent. - reviewed potential office procedure with urethral bulking (Bulkamid) at the time of cystoscopy. We discussed success rate of approximately 70-80% and possible need for second injection. We reviewed that this is not a permanent procedure and the Bulkamid does dissolve over time. Risks reviewed including injury to bladder or urethra, UTI, urinary retention and hematuria.  - We discussed the symptoms of overactive bladder (OAB), which include urinary urgency, urinary frequency, nocturia, with or without urge incontinence.  While we do not know the exact etiology of OAB, several treatment options exist. We discussed management including behavioral therapy (decreasing bladder irritants, urge suppression strategies, timed voids, bladder retraining), physical therapy, medication; for refractory cases posterior tibial nerve stimulation, sacral neuromodulation, and intravesical botulinum toxin injection.  For anticholinergic medications, we discussed the potential side effects of anticholinergics including dry eyes, dry mouth, constipation, cognitive impairment and urinary retention. For Beta-3 agonist medication, we discussed the potential side effect of elevated blood pressure which is more likely to occur in individuals with uncontrolled hypertension. - symptoms after Keflex for presumed UTI and vaginal estrogen, continue vaginal estrogen - 10/05/23: urodynamics showed urinary retention with SUI,valsalva voiding and dyssynergic EMG. Discussed need to improve consistent bladder emptying prior to repeat anti-incontinence procedures. PVR  70mL today - PFPT referral placed, discussed pessary trial to r/o outlet obstruction from prolapse

## 2023-10-12 NOTE — Assessment & Plan Note (Signed)
-   Treatment options include anti-diarrhea medication (loperamide/ Imodium OTC or prescription lomotil), fiber supplements, physical therapy, and possible sacral neuromodulation or surgery.   - encouraged trial of fiber supplementation - referral for pelvic floor PT

## 2023-10-12 NOTE — Assessment & Plan Note (Signed)
-   previously catheterized for , repeat 70mL today - POCT + leuk/protein/heme. Rx Keflex 500mg  QID for presumed complicated UTI - For treatment of recurrent urinary tract infections, we discussed management of recurrent UTIs including prophylaxis with a daily low dose antibiotic, transvaginal estrogen therapy, D-mannose, and cranberry supplements.  We discussed the role of diagnostic testing such as cystoscopy and upper tract imaging.   - continue low dose vaginal estrogen  - start probiotics - consider antibiotic suppression if recurrent symptoms until 3 months of vaginal estrogen

## 2023-10-12 NOTE — Progress Notes (Signed)
Urogynecology Return Visit  SUBJECTIVE  History of Present Illness: [name] is a 78 y.o. female seen in follow-up for mixed urinary incontinence, history of rUTI and midurethral sling, fecal incontinence, nocturia, left ovarian cyst, vaginal atrophy. Plan at last visit was urodynamic testing and vaginal estrogen.   S/p possible Sparc midurethral sling Dr. Sherron Morgan 07/05/06  Started vaginal estrogen with reduction of urinary leakage 09/01/23 UA + leuk/nit/protein/heme, urine culture >100K Klebsiella pneumoniae resistant to ampicillin and macrobid.  Rx Keflex with reduction of urinary leakage. Reduction of intermittent discomfort when she voids described as "knife like sharp pain", previously daily now 2-3x/week.  Leaks 3-4x/days with urgency Leaks 1-2x/day with activity, smaller volume leakage, however with reduced activity recently Pad use 1-2 pads/day with reduced foam in urine and volume of leakage  Urodynamic Impression 10/05/23:  Uroflow Qmax of 17 mL/sec, voided 210 mL and residual of 300 mL.  1. Sensation was increased; capacity was normal 2. Stress Incontinence was demonstrated at ISD range pressures; MUCP 29 cmH2O 3. Detrusor Overactivity was not demonstrated. Normal compliance 4. Emptying was dysfunctional with a elevated PVR ( ), a sustained detrusor contraction present,  abdominal straining present, dyssynergic urethral sphincter activity on EMG. Pdet at Qmax was 13 cm of water.  Qmax was 10 mL/sec.   Repeat TVUS 09/22/23 "Uterus:  absent.  Left ovary:  4.2 x 3.97 x 3.30 cm.  Simple ovarian cyst 3.39 cm, avascular.  Right ovary:  2.26 x 1.55 x 1.46 cm. 1.8 cm simple avascular cyst noted adjacent to the ovary.  No free fluid." Yearly pelvic US per Dr. 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   Past Surgical History: She  has a past surgical history that includes Ganglion cyst excision (Right); Thumb surgery (2/13 & ?2010); Incontinence surgery (07/05/2006); Cystocele repair (07/05/2006); Cysto (07/05/2006); Strabismus surgery (Right, 01/26/2008); Anterior lat lumbar fusion (10/08/2008); Back surgery; Total vaginal hysterectomy (1986 - or age 34); Foot surgery (Right); ORIF forearm fracture (Right, 09/2012); Shoulder arthroscopy with rotator cuff repair and subacromial decompression (Right, 08/02/2013); Colonoscopy w/ biopsies (11/2009); Colonoscopy w/ biopsies (11/2012); Esophagogastroduodenoscopy endoscopy (05/25/2005); Back surgery (10/03/2014); varicose veins (Bilateral); Strabismus surgery (Right, 06/03/2017); Tubal ligation; Eye surgery (Right); Total knee arthroplasty (Right, 02/12/2019); Total knee revision (Right, 05/12/2022); and Cataract extraction, bilateral.   Medications: She has a current medication list which includes the following prescription(s): acetaminophen, benazepril, calcium carb-cholecalciferol, cyanocobalamin, estradiol, ibandronate, levothyroxine, magnesium, fiber, moxifloxacin, multivitamin, multiple vitamins-minerals, naproxen sodium, nitrofurantoin (macrocrystal-monohydrate), and prednisone.   Allergies: Patient is allergic to penicillins and sulfa antibiotics.   Social History: Patient  reports that she has never smoked. She has been exposed to tobacco smoke. She has never used smokeless tobacco. She reports that she does not drink alcohol and does not use drugs.     OBJECTIVE      Physical Exam: Vitals:   10/12/23 1042  BP: (!) 160/91  Pulse: 70   Gen: No apparent distress, A&O x 3.  Straight Catheterization Procedure for PVR: After verbal consent was obtained from the patient for catheterization to assess bladder emptying and residual volume the  urethra and surrounding tissues were prepped with betadine and an in and out catheterization was performed.  PVR was 70mL.  Urine appeared clear yellow. The patient tolerated the procedure well.  Pelvic Exam: Normal external female genitalia; Bartholin's and Skene's glands normal in appearance; urethral meatus normal in appearance, no urethral masses or discharge.   A size 2 and 3 ring with support (descent with valsalva, 2 and 3 donut (pain with insertion), size 2 and 3 gelhorn pessary (descent). A size 3 cube pessary was fitted. It was comfortable, stayed in place with valsalva and was an appropriate size on examination, with one finger fitting between the pessary and the vaginal walls. Minimal descent was noted standing with valsalva.   Small amount of bleeding noted at posterior fourchette.   Lab Results  Component Value Date   COLORU Yellow 10/12/2023   CLARITYU Clear 10/12/2023   GLUCOSEUR Negative 10/12/2023   BILIRUBINUR Negative 10/12/2023   KETONESU Negative 10/12/2023   SPECGRAV 1.020 10/12/2023   RBCUR Small 10/12/2023   PHUR 6.0 10/12/2023   PROTEINUR Negative 10/12/2023   UROBILINOGEN 0.2 10/12/2023   LEUKOCYTESUR Small (1+) (A) 10/12/2023        No data to display             ASSESSMENT AND PLAN    Ms. [name] is a 78 y.o. with:  1. Urinary incontinence, mixed   2. Incontinence of feces, unspecified fecal incontinence type   3. History of pelvic surgery   4. History of recurrent UTI (urinary tract infection)   5. Nocturia   6. Vaginal atrophy   7. Fitting and adjustment of pessary   8. Microscopic hematuria     Urinary incontinence, mixed Assessment & Plan: - baseline stress >  urgency in the absence of UTI - history of Sparc Midurethral sling by Dr. Sherron Morgan 07/05/06   - For treatment of stress urinary incontinence,  non-surgical options include expectant management, weight loss, physical therapy, as well as a pessary.  Surgical options include a midurethral sling, Burch urethropexy, and transurethral injection of a bulking agent. - reviewed potential office procedure with urethral bulking (Bulkamid) at the time of cystoscopy. We discussed success rate of approximately 70-80% and possible need for second injection. We reviewed that this is not a permanent procedure and the Bulkamid does dissolve over time. Risks reviewed including injury to bladder or urethra, UTI, urinary retention and hematuria.  - We discussed the symptoms of overactive bladder (OAB), which include urinary urgency, urinary frequency, nocturia, with or without urge incontinence.  While we do not know the exact etiology of OAB, several treatment options exist. We discussed management including behavioral therapy (decreasing bladder irritants, urge suppression strategies, timed voids, bladder retraining), physical therapy, medication; for refractory cases posterior tibial nerve stimulation, sacral neuromodulation, and intravesical botulinum toxin injection.  For anticholinergic medications, we discussed the potential side effects of anticholinergics including dry eyes, dry mouth, constipation, cognitive impairment and urinary retention. For Beta-3 agonist medication, we discussed the potential side effect of elevated blood pressure which is more likely to occur in individuals with uncontrolled hypertension. - symptoms after Keflex for presumed UTI and vaginal estrogen, continue vaginal estrogen - 10/05/23: urodynamics showed urinary retention with SUI,valsalva voiding and dyssynergic EMG. Discussed need to improve consistent bladder emptying prior to repeat anti-incontinence procedures. PVR 70mL today - PFPT  referral placed, discussed pessary trial to r/o outlet obstruction from prolapse  Orders: -     AMB referral to rehabilitation -  CT RENAL ABDOMEN W WO CONTRAST; Future -     POCT urinalysis dipstick  Incontinence of feces, unspecified fecal incontinence type Assessment & Plan: - Treatment options include anti-diarrhea medication (loperamide/ Imodium OTC or prescription lomotil), fiber supplements, physical therapy, and possible sacral neuromodulation or surgery.   - encouraged trial of fiber supplementation - referral for pelvic floor PT   Orders: -     AMB referral to rehabilitation  History of pelvic surgery Assessment & Plan: - Sparc Midurethral sling by Dr. Sherron Morgan in 07/05/06 - no mesh noted on vaginal exam - urinary retention on urodynamics, etiology includes outlet obstruction from pelvic organ prolapse vs.  Midurethral sling vs. Pelvic floor dyssynergia - recommended cystoscopy after treatment of UTI to r/o foreign material in the GU tract - consider urethral bulking if PVR returns to normal  Orders: -     AMB referral to rehabilitation -     CT RENAL ABDOMEN W WO CONTRAST; Future  History of recurrent UTI (urinary tract infection) Assessment & Plan: - previously catheterized for , repeat 70mL today - POCT + leuk/protein/heme. Rx Keflex 500mg  QID for presumed complicated UTI - For treatment of recurrent urinary tract infections, we discussed management of recurrent UTIs including prophylaxis with a daily low dose antibiotic, transvaginal estrogen therapy, D-mannose, and cranberry supplements.  We discussed the role of diagnostic testing such as cystoscopy and upper tract imaging.   - continue low dose vaginal estrogen  - start probiotics - consider antibiotic suppression if recurrent symptoms until 3 months of vaginal estrogen  Orders: -     AMB referral to rehabilitation  Nocturia Assessment & Plan: - avoid fluid intake after 6pm - denies lower  extremity swelling - reports snoring, sleep study to r/o sleep apnea   Orders: -     AMB referral to rehabilitation -     Ambulatory referral to Sleep Studies  Vaginal atrophy Assessment & Plan: - For symptomatic vaginal atrophy options include lubrication with a water-based lubricant, personal hygiene measures and barrier protection against wetness, and estrogen replacement in the form of vaginal cream, vaginal tablets, or a time-released vaginal ring.   - continue low dose vaginal estrogen 2x/week, advised to apply at vaginal opening due to atrophy with bleeding during pessary fitting   Fitting and adjustment of pessary Assessment & Plan: - failed size 2 and 3 ring with support (descent with valsalva, 2 and 3 donut (pain with insertion), size 2 and 3 gelhorn pessary (descent)  - trial of size 3 cube pessary, pt to return by 4pm if discomfort - repeat PVR   Microscopic hematuria Assessment & Plan: - 09/01/23 UA + leuk/nit/protein/heme, urine culture >100K Klebsiella pneumoniae resistant to ampicillin and macrobid.  Rx Keflex - repeat 10/05/23 POCT UA for UDS + heme - pending repeat today - scheduled cystoscopy - ordered CT urogram - Cr 0.67 in 2023  Orders: -     CT RENAL ABDOMEN W WO CONTRAST; Future -     Urine Microscopic; Future -     POCT urinalysis dipstick  Time spent: I spent 39 minutes dedicated to the care of this patient on the date of this encounter to include pre-visit review of records, face-to-face time with the patient discussing urinary retention, mixed urinary incontinence, history of midurethral sling, nocturia, vaginal atrophy, microscopic hematuria, fecal incontinence, and post visit documentation and ordering medication/ testing separate from pessary fitting.   Loleta Chance, MD

## 2023-10-17 NOTE — Therapy (Signed)
 OUTPATIENT PHYSICAL THERAPY FEMALE PELVIC EVALUATION   Patient Name: [name] MRN: [id number] DOB:[birth year], 78 y.o., female Today's Date: 10/18/2023  END OF SESSION:  PT End of Session - 10/18/23 1042     Visit Number 1    Date for PT Re-Evaluation 01/10/24    Authorization Type [employer] medicare    PT Start Time 1030    PT Stop Time 1115    PT Time Calculation (min) 45 min    Activity Tolerance Patient tolerated treatment well    Behavior During Therapy [facility] for tasks assessed/performed             Past Medical History:  Diagnosis Date   Bilateral ovarian cysts 09/12/2014   bilateral simple ovarian cysts.  US  due in January 2018.   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 x 20 yr.   Hyperlipidemia    Hypothyroid    IBS (irritable bowel syndrome)    Impingement syndrome of right shoulder 07/2013   Osteoporosis    Polymyalgia rheumatica (HCC)    5-28  states she has been on prednisone  and now is trying to only be on plaquenil  to manage the polymyalgia    PONV (postoperative nausea and vomiting)    Primary localized osteoarthritis of right knee    Rupture of right rotator cuff 07/2013   VIN I (vulvar intraepithelial neoplasia I) 2017, 2018   this is from a perineal/vulvar biopsy.   Vitamin B12 deficiency    Past Surgical History:  Procedure Laterality Date   ANTERIOR LAT LUMBAR FUSION  10/08/2008   L3-4   BACK SURGERY     BACK SURGERY  10/03/2014   --Dr. Oneil Carwin at [facility]   CATARACT EXTRACTION, BILATERAL     03/2023, 04/2023   COLONOSCOPY W/ BIOPSIES  11/2009   polyps   COLONOSCOPY W/ BIOPSIES  11/2012   polyp, recheck in 3 years   CYSTO  07/05/2006   CYSTOCELE REPAIR  07/05/2006   ESOPHAGOGASTRODUODENOSCOPY ENDOSCOPY  05/25/2005   erythema and edema of gastric lumen   EYE SURGERY Right    muscle adjustment    FOOT SURGERY Right    GANGLION  CYST EXCISION Right    wrist   INCONTINENCE SURGERY  07/05/2006   cystourethropexy   ORIF FOREARM FRACTURE Right 09/2012   SHOULDER ARTHROSCOPY WITH ROTATOR CUFF REPAIR AND SUBACROMIAL DECOMPRESSION Right 08/02/2013   Procedure: SHOULDER ARTHROSCOPY WITH ROTATOR CUFF REPAIR AND SUBACROMIAL DECOMPRESSION, EXTENSIVE DEBRIDEMENT (INCLUDING LABRUM), PARTIAL ACROMIPLASY WITH CORACOACROMIAL RELEASE, DISTAL CLAVICULECTOMY;  Surgeon: Toribio JULIANNA Chancy, MD;  Location: [facility];  Service: Orthopedics;  Laterality: Right;   STRABISMUS SURGERY Right 01/26/2008   2 other surgeries previously   STRABISMUS SURGERY Right 06/03/2017   Procedure: REPAIR STRABISMUS RIGHT EYE;  Surgeon: Neysa Fallow, MD;  Location:  SURGERY CENTER;  Service: Ophthalmology;  Laterality: Right;   Thumb surgery  2/13 & ?2010   Bilateral - states a joint was replaced   TOTAL KNEE ARTHROPLASTY Right 02/12/2019   Procedure: TOTAL KNEE ARTHROPLASTY;  Surgeon: Jane Charleston, MD;  Location: [facility];  Service: Orthopedics;  Laterality: Right;   TOTAL KNEE REVISION Right 05/12/2022   Procedure: Right knee polyethylene versus total knee arthroplasty revision;  Surgeon: Melodi Lerner, MD;  Location: [facility];  Service: Orthopedics;  Laterality: Right;   TOTAL VAGINAL HYSTERECTOMY  1986 - or age 89  secondary AUB   TUBAL LIGATION     laparosopic BTL age 21   varicose veins Bilateral    Patient Active Problem List   Diagnosis Date Noted   Fitting and adjustment of pessary 10/12/2023   Microscopic hematuria 10/12/2023   History of recurrent UTI (urinary tract infection) 09/01/2023   History of pelvic surgery 09/01/2023   Urinary incontinence, mixed 09/01/2023   Dysuria 09/01/2023   Nocturia 09/01/2023   Vaginal atrophy 09/01/2023   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   Precordial chest pain 12/31/2021   Primary localized osteoarthritis of  right knee 10/19/2018   Right foot pain 10/19/2018   Incontinence of feces 09/21/2018   Vulvar lesion 07/20/2018   History of repair of right rotator cuff 03/03/2017   Primary osteoarthritis of both hands/ has had bilateral [facility] surgery  03/03/2017   History of hypertension 03/03/2017   History of hyperlipidemia 03/03/2017   DDD (degenerative disc disease), lumbar/ history of lumbar surgery  03/03/2017   Osteopenia of multiple sites 03/03/2017   Chronic left shoulder pain 01/20/2017   Primary osteoarthritis of both knees 01/20/2017   Trapezius muscle spasm 01/20/2017   Insomnia 01/20/2017   Fatigue 01/20/2017   Fibromyalgia 01/20/2017   High risk medications (not anticoagulants) long-term use 01/20/2017   PMR (polymyalgia rheumatica) (HCC) 01/20/2017   Left ovarian cyst 06/02/2015   Degenerative joint disease, shoulder, right 08/02/2013   Precordial pain 02/11/2012   HTN (hypertension) 02/11/2012   Hyperlipidemia 02/11/2012    PCP: Loring Tanda Mae, MD  REFERRING PROVIDER: Guadlupe Lianne DASEN, MD   REFERRING DIAG:  [phone] (ICD-10-CM) - Urinary incontinence, mixed  R15.9 (ICD-10-CM) - Incontinence of feces, unspecified fecal incontinence type  Z98.890 (ICD-10-CM) - History of pelvic surgery  Z87.440 (ICD-10-CM) - History of recurrent UTI (urinary tract infection)  R35.1 (ICD-10-CM) - Nocturia    THERAPY DIAG:  Muscle weakness (generalized)  Other lack of coordination  Rationale for Evaluation and Treatment: Rehabilitation  ONSET DATE: 2023  SUBJECTIVE:                                                                                                                                                                                           SUBJECTIVE STATEMENT: Tried the pessary and had to take it out. Next week I am getting a CAT scan to have the kidneys and bladder checked out.  Fluid intake: Yes: water , milk, coffee, pepsi zero    PAIN:  Are you having pain? Yes NPRS  scale: 3/10 Pain location:  right low back  Pain type: aching Pain description:  constant   Aggravating factors: standing,  Relieving factors: medication  PRECAUTIONS: None  RED FLAGS: None   WEIGHT BEARING RESTRICTIONS: No  FALLS:  Has patient fallen in last 6 months? No, door opened quickly and knocked her down and not due to balance.   LIVING ENVIRONMENT: Lives with: lives with their spouse   OCCUPATION: makes cakes, standing  PLOF: Independent  PATIENT GOALS: reduce leakage  PERTINENT HISTORY:  S/p possible [employer]c midurethral sling Dr. Gaston 07/05/06 ; Fibromyalgia; HTN; Osteoporosis; IBS; Polymyalgia rheumatica; vulvar intraepithelial neoplasia; Total vaginal hysterectomy  1986; Anterior lat lumbar fusion 2010; fractured pelvis   BOWEL MOVEMENT: Pain with bowel movement: No Leakage: Yes: only when she eats a lot of chocolate Pads: Yes:   Fiber supplement: Yes:    URINATION: Pain with urination: No Fully empty bladder: No, PVR is 70 ml Stream: Weak Urgency: Yes:   Frequency: average Leakage: waiting too long, carrying something heavy, coughing, laughing.  Pads: 1-2 pads/day   INTERCOURSE: not active  PREGNANCY: Vaginal deliveries 3 Tearing Yes:       OBJECTIVE:  Note: Objective measures were completed at Evaluation unless otherwise noted.  DIAGNOSTIC FINDINGS:  Urodynamic Impression 10/05/23:  Uroflow Qmax of 17 mL/sec, voided 210 mL and residual of 300 mL.  1. Sensation was increased; capacity was normal 2. Stress Incontinence was demonstrated at ISD range pressures; MUCP 29 cmH2O 3. Detrusor Overactivity was not demonstrated. Normal compliance 4. Emptying was dysfunctional with a elevated PVR ( ), a sustained detrusor contraction present,  abdominal straining present, dyssynergic urethral sphincter activity on EMG. Pdet at Qmax was 13 cm of water .  Qmax was 10 mL/sec.  Repeat TVUS 09/22/23 Uterus:  absent.  Left ovary:  4.2 x 3.97 x  3.30 cm.  Simple ovarian cyst 3.39 cm, avascular.  Right ovary:  2.26 x 1.55 x 1.46 cm. 1.8 cm simple avascular cyst noted adjacent to the ovary.  No free fluid. Yearly pelvic US  per Dr. Cathlyn  PVR was 70mL   PATIENT SURVEYS:  UIQ-7 24 PFIQ-7 22  COGNITION: Overall cognitive status: Within functional limits for tasks assessed     SENSATION: Light touch: Appears intact Proprioception: Appears intact   POSTURE: rounded shoulders, forward head, decreased lumbar lordosis, and flexed trunk     LOWER EXTREMITY ROM:  Passive ROM Right eval Left eval  Hip abduction 20 5  Hip internal rotation 14 45  Hip external rotation 35 25   (Blank rows = not tested)  LOWER EXTREMITY MMT:  MMT Right eval Left eval  Hip extension 4/5 4/5  Hip abduction 3/5 3/5  Hip adduction 4/5 4/5   PALPATION:   General  tightness in the abdominal muscles and diaphragm, decreased movement of the lower rib cage                External Perineal Exam slight redness along the posterior fourchette                             Internal Pelvic Floor tenderness located on the left iliococcygeus, initially she would bulge down to contract the pelvic floor and she bulged when laughing. Firmness along the perineal body  Patient confirms identification and approves PT to assess internal pelvic floor and treatment Yes  PELVIC MMT:   MMT eval  Vaginal 1/5  (Blank rows = not tested)        TONE: average  TODAY'S TREATMENT:  DATE: 10/18/23  EVAL See below   PATIENT EDUCATION:  10/18/23 Education details: Access Code: [email or web address], education on not holding her breath with hard tasks, flexing at the hips instead of the waist Person educated: Patient Education method: Explanation, Demonstration, Tactile cues, Verbal cues, and Handouts Education comprehension: verbalized understanding,  returned demonstration, verbal cues required, tactile cues required, and needs further education  HOME EXERCISE PROGRAM: 10/18/23 Access Code: [email or web address] URL: [email or web address] Date: 10/18/2023 Prepared by: Channing Pereyra  Exercises - Supine Pelvic Floor Contraction  - 3 x daily - 7 x weekly - 1 sets - 10 reps  ASSESSMENT:  CLINICAL IMPRESSION: Patient is a 78 y.o. female who was seen today for physical therapy evaluation and treatment for urinary and fecal incontinence. Patient reports her urinary incontinence has improved since she has taken out the pessary. She had blood with the pessary when she took it out and has kept it out. Patient will leak urine with  waiting too long, carrying something heavy, coughing, laughing. She has urgency and wears 1-2 pads per day. She does have back pain but has a history of back surgeries. Pelvic floor strength is 1/5 and initially she would bear down when asked to contract the muscles. She will bulge her pelvic floor when she laughed. She has tenderness located in the left iliococcygeus. She has tightness in the perineal body. She has redness located on the posterior fourchette area. Patient reports she will leak stool when she eats too much chocolate. Patient has decreased hip ROM. She has tightness throughout her abdomen and diaphragm. She will bulge the lower abdomen when contracting. Patient will benefit from skilled therapy to improve pelvic floor strength and coordination to reduce urinary leakage and prolapse management.   OBJECTIVE IMPAIRMENTS: decreased coordination, decreased endurance, decreased strength, and increased fascial restrictions.   ACTIVITY LIMITATIONS: lifting, bending, continence, and locomotion level  PARTICIPATION LIMITATIONS: shopping and community activity  PERSONAL FACTORS: Fitness and 3+ comorbidities: S/p possible Sparc midurethral sling Dr. Gaston 07/05/06 ; Fibromyalgia; HTN; Osteoporosis; IBS; Polymyalgia  rheumatica; vulvar intraepithelial neoplasia; Total vaginal hysterectomy  1986; Anterior lat lumbar fusion 2010; fractured pelvis   are also affecting patient's functional outcome.   REHAB POTENTIAL: Excellent  CLINICAL DECISION MAKING: Evolving/moderate complexity  EVALUATION COMPLEXITY: Moderate   GOALS: Goals reviewed with patient? Yes  SHORT TERM GOALS: Target date: 11/15/23  Patient independent with initial HEP for hip stretches and abdominal massage.  Baseline: Goal status: INITIAL  2.  Patient is able to demonstrate diaphragmatic breathing to expand the lower rib cage and relax the pelvic floor.  Baseline:  Goal status: INITIAL  3.  Patient educated on proper lifting techniques to flex at her hips and not waist.  Baseline:  Goal status: INITIAL   LONG TERM GOALS: Target date: 01/10/24  Patient independent with advanced HEP for core and pelvic floor strength.  Baseline:  Goal status: INITIAL  2.  Patient reports her urinary leakage with coughing and sneezing has decreased >/= 60% due to increased strength and coordination of the pelvic floor.  Baseline:  Goal status: INITIAL  3.  Patient understands pressure management to reduce strain on her pelvic floor and reduce urinary leakage.  Baseline:  Goal status: INITIAL  4.  Patient reports her urinary urgency has decreased >/= 60% due to using the urge to void behavioral technique.  Baseline:  Goal status: INITIAL    PLAN:  PT FREQUENCY: 1-2x/week  PT DURATION: 12 weeks  PLANNED INTERVENTIONS: 97110-Therapeutic exercises, 97530- Therapeutic activity,  [id number]- Neuromuscular re-education, [phone]- Self Care, [id number]- Manual therapy, Patient/Family education, Joint mobilization, and Biofeedback  PLAN FOR NEXT SESSION: manual work to the diaphragm and abdomen; hip stretches, abdominal engagement   Channing Pereyra, PT 10/18/23 11:58 AM

## 2023-10-18 ENCOUNTER — Other Ambulatory Visit: Payer: Self-pay

## 2023-10-18 ENCOUNTER — Encounter: Payer: Self-pay | Admitting: Physical Therapy

## 2023-10-18 ENCOUNTER — Encounter: Payer: Medicare Other | Attending: Obstetrics | Admitting: Physical Therapy

## 2023-10-18 DIAGNOSIS — R159 Full incontinence of feces: Secondary | ICD-10-CM | POA: Insufficient documentation

## 2023-10-18 DIAGNOSIS — R351 Nocturia: Secondary | ICD-10-CM | POA: Insufficient documentation

## 2023-10-18 DIAGNOSIS — Z87440 Personal history of urinary (tract) infections: Secondary | ICD-10-CM | POA: Insufficient documentation

## 2023-10-18 DIAGNOSIS — N3946 Mixed incontinence: Secondary | ICD-10-CM | POA: Insufficient documentation

## 2023-10-18 DIAGNOSIS — R278 Other lack of coordination: Secondary | ICD-10-CM | POA: Insufficient documentation

## 2023-10-18 DIAGNOSIS — Z9889 Other specified postprocedural states: Secondary | ICD-10-CM | POA: Diagnosis not present

## 2023-10-18 DIAGNOSIS — M6281 Muscle weakness (generalized): Secondary | ICD-10-CM | POA: Insufficient documentation

## 2023-10-25 ENCOUNTER — Encounter: Payer: Self-pay | Admitting: Physical Therapy

## 2023-10-25 ENCOUNTER — Encounter: Payer: Medicare Other | Admitting: Physical Therapy

## 2023-10-25 ENCOUNTER — Ambulatory Visit (HOSPITAL_COMMUNITY)
Admission: RE | Admit: 2023-10-25 | Discharge: 2023-10-25 | Disposition: A | Discharge status: Home / Self Care | Payer: Medicare Other | Source: Ambulatory Visit | Attending: Obstetrics | Admitting: Obstetrics

## 2023-10-25 ENCOUNTER — Encounter (HOSPITAL_COMMUNITY): Payer: Self-pay

## 2023-10-25 DIAGNOSIS — M6281 Muscle weakness (generalized): Secondary | ICD-10-CM | POA: Diagnosis not present

## 2023-10-25 DIAGNOSIS — R3129 Other microscopic hematuria: Secondary | ICD-10-CM | POA: Insufficient documentation

## 2023-10-25 DIAGNOSIS — Z9889 Other specified postprocedural states: Secondary | ICD-10-CM | POA: Diagnosis present

## 2023-10-25 DIAGNOSIS — N3946 Mixed incontinence: Secondary | ICD-10-CM | POA: Insufficient documentation

## 2023-10-25 DIAGNOSIS — R278 Other lack of coordination: Secondary | ICD-10-CM

## 2023-10-25 MED ORDER — IOHEXOL 300 MG/ML  SOLN
100.0000 mL | Freq: Once | INTRAMUSCULAR | Status: AC | PRN
  Administered 2023-10-25: 100 mL via INTRAVENOUS

## 2023-10-25 MED ORDER — SODIUM CHLORIDE (PF) 0.9 % IJ SOLN
INTRAMUSCULAR | Status: AC
  Filled 2023-10-25: qty 50

## 2023-10-25 NOTE — Therapy (Signed)
OUTPATIENT PHYSICAL THERAPY FEMALE PELVIC TREATMENT   Patient Name: [name] MRN: [id number] DOB:[birth year], 78 y.o., female Today's Date: 10/25/2023  END OF SESSION:  PT End of Session - 10/25/23 1355     Visit Number 2    Date for PT Re-Evaluation 01/10/24    Authorization Type [employer] medicare    Authorization - Visit Number 2    Authorization - Number of Visits 10    PT Start Time 1356    PT Stop Time 1445    PT Time Calculation (min) 49 min    Activity Tolerance Patient tolerated treatment well    Behavior During Therapy [facility] for tasks assessed/performed             Past Medical History:  Diagnosis Date   Bilateral ovarian cysts 09/12/2014   bilateral simple ovarian cysts.  [place] due in January 2018.   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 x 20 yr.   Hyperlipidemia    Hypothyroid    IBS (irritable bowel syndrome)    Impingement syndrome of right shoulder 07/2013   Osteoporosis    Polymyalgia rheumatica (HCC)    5-28  states she has been on prednisone and now is trying to only be on plaquenil to manage the polymyalgia    PONV (postoperative nausea and vomiting)    Primary localized osteoarthritis of right knee    Rupture of right rotator cuff 07/2013   VIN I (vulvar intraepithelial neoplasia I) 2017, 2018   this is from a perineal/vulvar biopsy.   Vitamin B12 deficiency    Past Surgical History:  Procedure Laterality Date   ANTERIOR LAT LUMBAR FUSION  10/08/2008   L3-4   BACK SURGERY     BACK SURGERY  10/03/2014   --Dr. Trey Sailors at [facility]   CATARACT EXTRACTION, BILATERAL     03/2023, 04/2023   COLONOSCOPY W/ BIOPSIES  11/2009   polyps   COLONOSCOPY W/ BIOPSIES  11/2012   polyp, recheck in 3 years   CYSTO  07/05/2006   CYSTOCELE REPAIR  07/05/2006   ESOPHAGOGASTRODUODENOSCOPY ENDOSCOPY  05/25/2005   erythema and edema of gastric lumen    EYE SURGERY Right    muscle adjustment    FOOT SURGERY Right    GANGLION CYST EXCISION Right    wrist   INCONTINENCE SURGERY  07/05/2006   cystourethropexy   ORIF FOREARM FRACTURE Right 09/2012   SHOULDER ARTHROSCOPY WITH ROTATOR CUFF REPAIR AND SUBACROMIAL DECOMPRESSION Right 08/02/2013   Procedure: SHOULDER ARTHROSCOPY WITH ROTATOR CUFF REPAIR AND SUBACROMIAL DECOMPRESSION, EXTENSIVE DEBRIDEMENT (INCLUDING LABRUM), PARTIAL ACROMIPLASY WITH CORACOACROMIAL RELEASE, DISTAL CLAVICULECTOMY;  Surgeon: Loreta Ave, MD;  Location: [facility];  Service: Orthopedics;  Laterality: Right;   STRABISMUS SURGERY Right 01/26/2008   2 other surgeries previously   STRABISMUS SURGERY Right 06/03/2017   Procedure: REPAIR STRABISMUS RIGHT EYE;  Surgeon: Verne Carrow, MD;  Location: [facility];  Service: Ophthalmology;  Laterality: Right;   Thumb surgery  2/13 & ?2010   Bilateral - states a joint was replaced   TOTAL KNEE ARTHROPLASTY Right 02/12/2019   Procedure: TOTAL KNEE ARTHROPLASTY;  Surgeon: Salvatore Marvel, MD;  Location: [facility];  Service: Orthopedics;  Laterality: Right;   TOTAL KNEE REVISION Right 05/12/2022   Procedure: Right knee polyethylene versus total knee arthroplasty revision;  Surgeon: Ollen Gross, MD;  Location: [facility];  Service: Orthopedics;  Laterality: Right;   TOTAL VAGINAL HYSTERECTOMY  43 - or age 28   secondary AUB   TUBAL LIGATION     laparosopic BTL age 78   varicose veins Bilateral    Patient Active Problem List   Diagnosis Date Noted   Fitting and adjustment of pessary 10/12/2023   Microscopic hematuria 10/12/2023   History of recurrent UTI (urinary tract infection) 09/01/2023   History of pelvic surgery 09/01/2023   Urinary incontinence, mixed 09/01/2023   Dysuria 09/01/2023   Nocturia 09/01/2023   Vaginal atrophy 09/01/2023   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    Precordial chest pain 12/31/2021   Primary localized osteoarthritis of right knee 10/19/2018   Right foot pain 10/19/2018   Incontinence of feces 09/21/2018   Vulvar lesion 07/20/2018   History of repair of right rotator cuff 03/03/2017   Primary osteoarthritis of both hands/ has had bilateral [facility] surgery  03/03/2017   History of hypertension 03/03/2017   History of hyperlipidemia 03/03/2017   DDD (degenerative disc disease), lumbar/ history of lumbar surgery  03/03/2017   Osteopenia of multiple sites 03/03/2017   Chronic left shoulder pain 01/20/2017   Primary osteoarthritis of both knees 01/20/2017   Trapezius muscle spasm 01/20/2017   Insomnia 01/20/2017   Fatigue 01/20/2017   Fibromyalgia 01/20/2017   High risk medications (not anticoagulants) long-term use 01/20/2017   PMR (polymyalgia rheumatica) (HCC) 01/20/2017   Left ovarian cyst 06/02/2015   Degenerative joint disease, shoulder, right 08/02/2013   Precordial pain 02/11/2012   HTN (hypertension) 02/11/2012   Hyperlipidemia 02/11/2012    PCP: Kaleen Mask, MD  REFERRING PROVIDER: Loleta Chance, MD   REFERRING DIAG:  [phone] (ICD-10-CM) - Urinary incontinence, mixed  R15.9 (ICD-10-CM) - Incontinence of feces, unspecified fecal incontinence type  Z98.890 (ICD-10-CM) - History of pelvic surgery  Z87.440 (ICD-10-CM) - History of recurrent UTI (urinary tract infection)  R35.1 (ICD-10-CM) - Nocturia    THERAPY DIAG:  Muscle weakness (generalized)  Other lack of coordination  Rationale for Evaluation and Treatment: Rehabilitation  ONSET DATE: 2023  SUBJECTIVE:                                                                                                                                                                                           SUBJECTIVE STATEMENT: I am still having trouble with my low back. I have not had any accidents. Wake up with some urine on pad but no leakage during the day.   Fluid  intake: Yes: water, milk, coffee, pepsi zero  PAIN:  Are you having pain? Yes NPRS scale: 3/10 Pain location:  right low back  Pain type: aching Pain description: constant   Aggravating factors: standing,  Relieving factors: medication  PRECAUTIONS: None  RED FLAGS: None   WEIGHT BEARING RESTRICTIONS: No  FALLS:  Has patient fallen in last 6 months? No, door opened quickly and knocked her down and not due to balance.   LIVING ENVIRONMENT: Lives with: lives with their spouse   OCCUPATION: makes cakes, standing  PLOF: Independent  PATIENT GOALS: reduce leakage  PERTINENT HISTORY:  S/p possible [employer] midurethral sling Dr. Sherron Monday 07/05/06 ; Fibromyalgia; HTN; Osteoporosis; IBS; Polymyalgia rheumatica; vulvar intraepithelial neoplasia; Total vaginal hysterectomy  1986; Anterior lat lumbar fusion 2010; fractured pelvis   BOWEL MOVEMENT: Pain with bowel movement: No Leakage: Yes: only when she eats a lot of chocolate Pads: Yes:   Fiber supplement: Yes:    URINATION: Pain with urination: No Fully empty bladder: No, PVR is 70 ml Stream: Weak Urgency: Yes:   Frequency: average Leakage: waiting too long, carrying something heavy, coughing, laughing.  Pads: 1-2 pads/day   INTERCOURSE: not active  PREGNANCY: Vaginal deliveries 3 Tearing Yes:       OBJECTIVE:  Note: Objective measures were completed at Evaluation unless otherwise noted.  DIAGNOSTIC FINDINGS:  Urodynamic Impression 10/05/23:  Uroflow Qmax of 17 mL/sec, voided 210 mL and residual of 300 mL.  1. Sensation was increased; capacity was normal 2. Stress Incontinence was demonstrated at ISD range pressures; MUCP 29 cmH2O 3. Detrusor Overactivity was not demonstrated. Normal compliance 4. Emptying was dysfunctional with a elevated PVR ( ), a sustained detrusor contraction present,  abdominal straining present, dyssynergic urethral sphincter activity on EMG. Pdet at Qmax was 13 cm of water.   Qmax was 10 mL/sec.  Repeat TVUS 09/22/23 "Uterus:  absent.  Left ovary:  4.2 x 3.97 x 3.30 cm.  Simple ovarian cyst 3.39 cm, avascular.  Right ovary:  2.26 x 1.55 x 1.46 cm. 1.8 cm simple avascular cyst noted adjacent to the ovary.  No free fluid." Yearly pelvic US per Dr. Ricki Miller  PVR was 70mL   PATIENT SURVEYS:  UIQ-7 24 PFIQ-7 22  COGNITION: Overall cognitive status: Within functional limits for tasks assessed     SENSATION: Light touch: Appears intact Proprioception: Appears intact   POSTURE: rounded shoulders, forward head, decreased lumbar lordosis, and flexed trunk     LOWER EXTREMITY ROM:  Passive ROM Right eval Left eval  Hip abduction 20 5  Hip internal rotation 14 45  Hip external rotation 35 25   (Blank rows = not tested)  LOWER EXTREMITY MMT:  MMT Right eval Left eval  Hip extension 4/5 4/5  Hip abduction 3/5 3/5  Hip adduction 4/5 4/5   PALPATION:   General  tightness in the abdominal muscles and diaphragm, decreased movement of the lower rib cage                External Perineal Exam slight redness along the posterior fourchette                             Internal Pelvic Floor tenderness located on the left iliococcygeus, initially she would bulge down to contract the pelvic floor and she bulged when laughing. Firmness along the perineal body  Patient confirms identification and approves PT to assess internal pelvic floor and treatment Yes  PELVIC MMT:   MMT eval  Vaginal 1/5  (  Blank rows = not tested)        TONE: average  TODAY'S TREATMENT:         10/25/23 Manual: Soft tissue mobilization: Manual work to the diaphragm to work on lower rib cage mobility and movement of the diaphragm Educated patient on abdominal massage to reduce the tightness and so the muscles can contract Scar tissue mobilization: Manual mobilization of the lower abdominal scar to improve tissue mobility.  Myofascial release: Tissue rolling of the lower  abdomen and lower rib cage to increase tissue mobility Neuromuscular re-education: Core retraining: Contraction of the transverse abdominus with breath to work on pelvic floor Marching with abdominal contraction and therapist giving tactile cue to contract instead of bulge the abdomen Bridge to work on hip extension and gluteal contraction Down training: Diaphragmatic breathing in supine to relax the pelvic floor Therapeutic activities: Functional strengthening activities: Educated patient on flexing at her hips and not waist when lifting items She is to face the item she is lifting and not twist.    PATIENT EDUCATION:  10/25/23 Education details: Access Code: [email or web address], education on not holding her breath with hard tasks, flexing at the hips instead of the waist Person educated: Patient Education method: Explanation, Demonstration, Tactile cues, Verbal cues, and Handouts Education comprehension: verbalized understanding, returned demonstration, verbal cues required, tactile cues required, and needs further education  HOME EXERCISE PROGRAM: 10/25/23 Access Code: [email or web address] URL: [email or web address] Date: 10/25/2023 Prepared by: Eulis Foster  Exercises - Supine Pelvic Floor Contraction  - 3 x daily - 7 x weekly - 1 sets - 10 reps - Supine Diaphragmatic Breathing  - 1 x daily - 7 x weekly - 1 sets - 10 reps - Hooklying Transversus Abdominis Palpation  - 1 x daily - 7 x weekly - 1 sets - 10 reps - Supine March  - 1 x daily - 7 x weekly - 1 sets - 10 reps - Supine Bridge with Pelvic Floor Contraction  - 1 x daily - 7 x weekly - 1 sets - 10 reps  ASSESSMENT:  CLINICAL IMPRESSION: Patient is a 78 y.o. female who was seen today for physical therapy  treatment for urinary and fecal incontinence.  Patient was able to contract her abdominals correctly with minimal tactile cueing. She had increased softness in her abdomen. She was able to feel her pelvic floor relax with  diaphragmatic breathing. She is able to not leak urine during the day. Patient will benefit from skilled therapy to improve pelvic floor strength and coordination to reduce urinary leakage and prolapse management.   OBJECTIVE IMPAIRMENTS: decreased coordination, decreased endurance, decreased strength, and increased fascial restrictions.   ACTIVITY LIMITATIONS: lifting, bending, continence, and locomotion level  PARTICIPATION LIMITATIONS: shopping and community activity  PERSONAL FACTORS: Fitness and 3+ comorbidities: S/p possible [employer] midurethral sling Dr. Sherron Monday 07/05/06 ; Fibromyalgia; HTN; Osteoporosis; IBS; Polymyalgia rheumatica; vulvar intraepithelial neoplasia; Total vaginal hysterectomy  1986; Anterior lat lumbar fusion 2010; fractured pelvis   are also affecting patient's functional outcome.   REHAB POTENTIAL: Excellent  CLINICAL DECISION MAKING: Evolving/moderate complexity  EVALUATION COMPLEXITY: Moderate   GOALS: Goals reviewed with patient? Yes  SHORT TERM GOALS: Target date: 11/15/23  Patient independent with initial HEP for hip stretches and abdominal massage.  Baseline: Goal status: INITIAL  2.  Patient is able to demonstrate diaphragmatic breathing to expand the lower rib cage and relax the pelvic floor.  Baseline:  Goal status: Met 10/25/23  3.  Patient educated on proper lifting techniques to  flex at her hips and not waist.  Baseline:  Goal status: Met 10/25/23   LONG TERM GOALS: Target date: 01/10/24  Patient independent with advanced HEP for core and pelvic floor strength.  Baseline:  Goal status: INITIAL  2.  Patient reports her urinary leakage with coughing and sneezing has decreased >/= 60% due to increased strength and coordination of the pelvic floor.  Baseline:  Goal status: INITIAL  3.  Patient understands pressure management to reduce strain on her pelvic floor and reduce urinary leakage.  Baseline:  Goal status: INITIAL  4.  Patient  reports her urinary urgency has decreased >/= 60% due to using the urge to void behavioral technique.  Baseline:  Goal status: INITIAL    PLAN:  PT FREQUENCY: 1-2x/week  PT DURATION: 12 weeks  PLANNED INTERVENTIONS: 97110-Therapeutic exercises, 97530- Therapeutic activity, 97112- Neuromuscular re-education, 97535- Self Care, [id number]- Manual therapy, Patient/Family education, Joint mobilization, and Biofeedback  PLAN FOR NEXT SESSION:  hip stretches, abdominal engagement, wall lift extremities, standing hip abduction   Eulis Foster, PT 10/25/23 2:50 PM

## 2023-10-27 ENCOUNTER — Ambulatory Visit: Payer: Medicare Other | Admitting: Obstetrics and Gynecology

## 2023-11-03 ENCOUNTER — Encounter: Payer: Self-pay | Admitting: Physical Therapy

## 2023-11-03 ENCOUNTER — Encounter: Payer: Medicare Other | Admitting: Physical Therapy

## 2023-11-03 DIAGNOSIS — R278 Other lack of coordination: Secondary | ICD-10-CM

## 2023-11-03 DIAGNOSIS — M6281 Muscle weakness (generalized): Secondary | ICD-10-CM | POA: Diagnosis not present

## 2023-11-03 NOTE — Therapy (Signed)
OUTPATIENT PHYSICAL THERAPY FEMALE PELVIC TREATMENT   Patient Name: [name] MRN: [id number] DOB:06-08-1946, 78 y.o., female Today's Date: 11/03/2023  END OF SESSION:  PT End of Session - 11/03/23 1109     Visit Number 3    Authorization Type [employer] medicare    Authorization - Visit Number 3    Authorization - Number of Visits 10    PT Start Time 1105    PT Stop Time 1150    PT Time Calculation (min) 45 min    Activity Tolerance Patient tolerated treatment well    Behavior During Therapy [facility] for tasks assessed/performed             Past Medical History:  Diagnosis Date   Bilateral ovarian cysts 09/12/2014   bilateral simple ovarian cysts.  [place] due in January 2018.   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 x 20 yr.   Hyperlipidemia    Hypothyroid    IBS (irritable bowel syndrome)    Impingement syndrome of right shoulder 07/2013   Osteoporosis    Polymyalgia rheumatica (HCC)    5-28  states she has been on prednisone and now is trying to only be on plaquenil to manage the polymyalgia    PONV (postoperative nausea and vomiting)    Primary localized osteoarthritis of right knee    Rupture of right rotator cuff 07/2013   VIN I (vulvar intraepithelial neoplasia I) 2017, 2018   this is from a perineal/vulvar biopsy.   Vitamin B12 deficiency    Past Surgical History:  Procedure Laterality Date   ANTERIOR LAT LUMBAR FUSION  10/08/2008   L3-4   BACK SURGERY     BACK SURGERY  10/03/2014   --Dr. Trey Sailors at [facility]   CATARACT EXTRACTION, BILATERAL     03/2023, 04/2023   COLONOSCOPY W/ BIOPSIES  11/2009   polyps   COLONOSCOPY W/ BIOPSIES  11/2012   polyp, recheck in 3 years   CYSTO  07/05/2006   CYSTOCELE REPAIR  07/05/2006   ESOPHAGOGASTRODUODENOSCOPY ENDOSCOPY  05/25/2005   erythema and edema of gastric lumen   EYE SURGERY Right    muscle adjustment     FOOT SURGERY Right    GANGLION CYST EXCISION Right    wrist   INCONTINENCE SURGERY  07/05/2006   cystourethropexy   ORIF FOREARM FRACTURE Right 09/2012   SHOULDER ARTHROSCOPY WITH ROTATOR CUFF REPAIR AND SUBACROMIAL DECOMPRESSION Right 08/02/2013   Procedure: SHOULDER ARTHROSCOPY WITH ROTATOR CUFF REPAIR AND SUBACROMIAL DECOMPRESSION, EXTENSIVE DEBRIDEMENT (INCLUDING LABRUM), PARTIAL ACROMIPLASY WITH CORACOACROMIAL RELEASE, DISTAL CLAVICULECTOMY;  Surgeon: Loreta Ave, MD;  Location: [facility];  Service: Orthopedics;  Laterality: Right;   STRABISMUS SURGERY Right 01/26/2008   2 other surgeries previously   STRABISMUS SURGERY Right 06/03/2017   Procedure: REPAIR STRABISMUS RIGHT EYE;  Surgeon: Verne Carrow, MD;  Location: [facility];  Service: Ophthalmology;  Laterality: Right;   Thumb surgery  2/13 & ?2010   Bilateral - states a joint was replaced   TOTAL KNEE ARTHROPLASTY Right 02/12/2019   Procedure: TOTAL KNEE ARTHROPLASTY;  Surgeon: Salvatore Marvel, MD;  Location: [facility];  Service: Orthopedics;  Laterality: Right;   TOTAL KNEE REVISION Right 05/12/2022   Procedure: Right knee polyethylene versus total knee arthroplasty revision;  Surgeon: Ollen Gross, MD;  Location: [facility];  Service: Orthopedics;  Laterality: Right;  TOTAL VAGINAL HYSTERECTOMY  89 - or age 45   secondary AUB   TUBAL LIGATION     laparosopic BTL age 71   varicose veins Bilateral    Patient Active Problem List   Diagnosis Date Noted   Fitting and adjustment of pessary 10/12/2023   Microscopic hematuria 10/12/2023   History of recurrent UTI (urinary tract infection) 09/01/2023   History of pelvic surgery 09/01/2023   Urinary incontinence, mixed 09/01/2023   Dysuria 09/01/2023   Nocturia 09/01/2023   Vaginal atrophy 09/01/2023   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   Precordial chest pain 12/31/2021    Primary localized osteoarthritis of right knee 10/19/2018   Right foot pain 10/19/2018   Incontinence of feces 09/21/2018   Vulvar lesion 07/20/2018   History of repair of right rotator cuff 03/03/2017   Primary osteoarthritis of both hands/ has had bilateral [facility] surgery  03/03/2017   History of hypertension 03/03/2017   History of hyperlipidemia 03/03/2017   DDD (degenerative disc disease), lumbar/ history of lumbar surgery  03/03/2017   Osteopenia of multiple sites 03/03/2017   Chronic left shoulder pain 01/20/2017   Primary osteoarthritis of both knees 01/20/2017   Trapezius muscle spasm 01/20/2017   Insomnia 01/20/2017   Fatigue 01/20/2017   Fibromyalgia 01/20/2017   High risk medications (not anticoagulants) long-term use 01/20/2017   PMR (polymyalgia rheumatica) (HCC) 01/20/2017   Left ovarian cyst 06/02/2015   Degenerative joint disease, shoulder, right 08/02/2013   Precordial pain 02/11/2012   HTN (hypertension) 02/11/2012   Hyperlipidemia 02/11/2012    PCP: Kaleen Mask, MD  REFERRING PROVIDER: Loleta Chance, MD   REFERRING DIAG:  [phone] (ICD-10-CM) - Urinary incontinence, mixed  R15.9 (ICD-10-CM) - Incontinence of feces, unspecified fecal incontinence type  Z98.890 (ICD-10-CM) - History of pelvic surgery  Z87.440 (ICD-10-CM) - History of recurrent UTI (urinary tract infection)  R35.1 (ICD-10-CM) - Nocturia    THERAPY DIAG:  Muscle weakness (generalized)  Other lack of coordination  Rationale for Evaluation and Treatment: Rehabilitation  ONSET DATE: 2023  SUBJECTIVE:                                                                                                                                                                                           SUBJECTIVE STATEMENT: Last 2-3 days have had increased in urinary leakage. I have had spasms that would soak the pad. It was happening 3 times per day.   Fluid intake: Yes: water, milk, coffee, pepsi zero     PAIN:  Are you having pain? Yes NPRS scale: 3/10  Pain location:  right low back  Pain type: aching Pain description: constant   Aggravating factors: standing,  Relieving factors: medication  PRECAUTIONS: None  RED FLAGS: None   WEIGHT BEARING RESTRICTIONS: No  FALLS:  Has patient fallen in last 6 months? No, door opened quickly and knocked her down and not due to balance.   LIVING ENVIRONMENT: Lives with: lives with their spouse   OCCUPATION: makes cakes, standing  PLOF: Independent  PATIENT GOALS: reduce leakage  PERTINENT HISTORY:  S/p possible [employer] midurethral sling Dr. Sherron Monday 07/05/06 ; Fibromyalgia; HTN; Osteoporosis; IBS; Polymyalgia rheumatica; vulvar intraepithelial neoplasia; Total vaginal hysterectomy  1986; Anterior lat lumbar fusion 2010; fractured pelvis   BOWEL MOVEMENT: Pain with bowel movement: No Leakage: Yes: only when she eats a lot of chocolate Pads: Yes:   Fiber supplement: Yes:    URINATION: Pain with urination: No Fully empty bladder: No, PVR is 70 ml Stream: Weak Urgency: Yes:   Frequency: average Leakage: waiting too long, carrying something heavy, coughing, laughing.  Pads: 1-2 pads/day   INTERCOURSE: not active  PREGNANCY: Vaginal deliveries 3 Tearing Yes:       OBJECTIVE:  Note: Objective measures were completed at Evaluation unless otherwise noted.  DIAGNOSTIC FINDINGS:  Urodynamic Impression 10/05/23:  Uroflow Qmax of 17 mL/sec, voided 210 mL and residual of 300 mL.  1. Sensation was increased; capacity was normal 2. Stress Incontinence was demonstrated at ISD range pressures; MUCP 29 cmH2O 3. Detrusor Overactivity was not demonstrated. Normal compliance 4. Emptying was dysfunctional with a elevated PVR ( ), a sustained detrusor contraction present,  abdominal straining present, dyssynergic urethral sphincter activity on EMG. Pdet at Qmax was 13 cm of water.  Qmax was 10 mL/sec.  Repeat TVUS  09/22/23 "Uterus:  absent.  Left ovary:  4.2 x 3.97 x 3.30 cm.  Simple ovarian cyst 3.39 cm, avascular.  Right ovary:  2.26 x 1.55 x 1.46 cm. 1.8 cm simple avascular cyst noted adjacent to the ovary.  No free fluid." Yearly pelvic US per Dr. Ricki Miller  PVR was 70mL   PATIENT SURVEYS:  UIQ-7 24 PFIQ-7 22  COGNITION: Overall cognitive status: Within functional limits for tasks assessed     SENSATION: Light touch: Appears intact Proprioception: Appears intact   POSTURE: rounded shoulders, forward head, decreased lumbar lordosis, and flexed trunk     LOWER EXTREMITY ROM:  Passive ROM Right eval Left eval  Hip abduction 20 5  Hip internal rotation 14 45  Hip external rotation 35 25   (Blank rows = not tested)  LOWER EXTREMITY MMT:  MMT Right eval Left eval  Hip extension 4/5 4/5  Hip abduction 3/5 3/5  Hip adduction 4/5 4/5   PALPATION:   General  tightness in the abdominal muscles and diaphragm, decreased movement of the lower rib cage                External Perineal Exam slight redness along the posterior fourchette                             Internal Pelvic Floor tenderness located on the left iliococcygeus, initially she would bulge down to contract the pelvic floor and she bulged when laughing. Firmness along the perineal body  Patient confirms identification and approves PT to assess internal pelvic floor and treatment Yes  PELVIC MMT:   MMT eval 11/03/23  Vaginal 1/5 1/5  (Blank rows = not tested)  TONE: average  TODAY'S TREATMENT:        11/03/23 Manual: Myofascial release: Facial release of the urogenital diaphragm on each side with right side tighter than left Fascial release of the perineal body externally Internal pelvic floor techniques: No emotional/communication barriers or cognitive limitation. Patient is motivated to learn. Patient understands and agrees with treatment goals and plan. PT explains patient will be examined in standing,  sitting, and lying down to see how their muscles and joints work. When they are ready, they will be asked to remove their underwear so PT can examine their perineum. The patient is also given the option of providing their own chaperone as one is not provided in our facility. The patient also has the right and is explained the right to defer or refuse any part of the evaluation or treatment including the internal exam. With the patient's consent, PT will use one gloved finger to gently assess the muscles of the pelvic floor, seeing how well it contracts and relaxes and if there is muscle symmetry. After, the patient will get dressed and PT and patient will discuss exam findings and plan of care. PT and patient discuss plan of care, schedule, attendance policy and HEP activities.  Going internally through the vagina working on the introitus and levator ani tissue for a pelvic floor contraction Going through the vaginal canal working on the posterior wall and the internal area of the perineal body Neuromuscular re-education: Pelvic floor contraction training: Therapist finger in the vaginal canal working on contract with a lift and not bearing down, giving her different verbal cues that work with her to contract and tactile cues to not bulge the abdomen and push the therapist finger out of the canal Sitting ball squeeze with pelvic floor contraction Sitting press ball into lap with abdominal and pelvic floor contraction Sitting with bilateral shoulder external rotation using red band with pelvic floor contraction Self-care: Educated patient on vaginal moisturizers, how it helps with the health of the tissue and reduce the red irritation she has, educated her on how to apply to the vaginal area nightly     10/25/23 Manual: Soft tissue mobilization: Manual work to the diaphragm to work on lower rib cage mobility and movement of the diaphragm Educated patient on abdominal massage to reduce the tightness  and so the muscles can contract Scar tissue mobilization: Manual mobilization of the lower abdominal scar to improve tissue mobility.  Myofascial release: Tissue rolling of the lower abdomen and lower rib cage to increase tissue mobility Neuromuscular re-education: Core retraining: Contraction of the transverse abdominus with breath to work on pelvic floor Marching with abdominal contraction and therapist giving tactile cue to contract instead of bulge the abdomen Bridge to work on hip extension and gluteal contraction Down training: Diaphragmatic breathing in supine to relax the pelvic floor Therapeutic activities: Functional strengthening activities: Educated patient on flexing at her hips and not waist when lifting items She is to face the item she is lifting and not twist.    PATIENT EDUCATION:  11/03/23 Education details: Access Code: [email or web address], educated patient on vaginal moisturizer for daily use and how to apply Person educated: Patient Education method: Explanation, Demonstration, Tactile cues, Verbal cues, and Handouts Education comprehension: verbalized understanding, returned demonstration, verbal cues required, tactile cues required, and needs further education  HOME EXERCISE PROGRAM: 11/03/23 Access Code: [email or web address] URL: [email or web address] Date: 11/03/2023 Prepared by: Eulis Foster  Exercises - Supine Pelvic Floor Contraction  - 3 x daily - 7 x  weekly - 1 sets - 10 reps - Supine Diaphragmatic Breathing  - 1 x daily - 3 x weekly - 1 sets - 10 reps - Hooklying Transversus Abdominis Palpation  - 1 x daily - 3 x weekly - 1 sets - 10 reps - Supine March  - 1 x daily - 3 x weekly - 1 sets - 10 reps - Supine Bridge with Pelvic Floor Contraction  - 1 x daily - 3 x weekly - 1 sets - 10 reps - Seated Hip Adduction Isometrics with Ball  - 2 x daily - 7 x weekly - 1 sets - 10 reps - Seated Abdominal Press into [employer]  - 2 x daily - 3 x weekly - 1 sets - 10  reps - Seated Shoulder W External Rotation on Swiss Ball  - 1 x daily - 3 x weekly - 3 sets - 10 reps  ASSESSMENT:  CLINICAL IMPRESSION: Patient is a 78 y.o. female who was seen today for physical therapy  treatment for urinary and fecal incontinence.  Patient has had a gush of urine leakage 3 times per day the last few days. Patient still needs tactile and verbal cues to not bulge her abdomen with pelvic floor contraction and not bulge the pelvic floor in supine. Patient was able to feel the pelvic floor contraction more in sitting and contract the abdominals correctly. She had redness of the labia majora and posterior fourchette. She understands how using vaginal moisturizers to reduce the irritation and improve the health of the tissue.  Patient will benefit from skilled therapy to improve pelvic floor strength and coordination to reduce urinary leakage and prolapse management.   OBJECTIVE IMPAIRMENTS: decreased coordination, decreased endurance, decreased strength, and increased fascial restrictions.   ACTIVITY LIMITATIONS: lifting, bending, continence, and locomotion level  PARTICIPATION LIMITATIONS: shopping and community activity  PERSONAL FACTORS: Fitness and 3+ comorbidities: S/p possible [employer] midurethral sling Dr. Sherron Monday 07/05/06 ; Fibromyalgia; HTN; Osteoporosis; IBS; Polymyalgia rheumatica; vulvar intraepithelial neoplasia; Total vaginal hysterectomy  1986; Anterior lat lumbar fusion 2010; fractured pelvis   are also affecting patient's functional outcome.   REHAB POTENTIAL: Excellent  CLINICAL DECISION MAKING: Evolving/moderate complexity  EVALUATION COMPLEXITY: Moderate   GOALS: Goals reviewed with patient? Yes  SHORT TERM GOALS: Target date: 11/15/23  Patient independent with initial HEP for hip stretches and abdominal massage.  Baseline: Goal status: INITIAL  2.  Patient is able to demonstrate diaphragmatic breathing to expand the lower rib cage and relax the pelvic  floor.  Baseline:  Goal status: Met 10/25/23  3.  Patient educated on proper lifting techniques to flex at her hips and not waist.  Baseline:  Goal status: Met 10/25/23   LONG TERM GOALS: Target date: 01/10/24  Patient independent with advanced HEP for core and pelvic floor strength.  Baseline:  Goal status: INITIAL  2.  Patient reports her urinary leakage with coughing and sneezing has decreased >/= 60% due to increased strength and coordination of the pelvic floor.  Baseline:  Goal status: INITIAL  3.  Patient understands pressure management to reduce strain on her pelvic floor and reduce urinary leakage.  Baseline:  Goal status: INITIAL  4.  Patient reports her urinary urgency has decreased >/= 60% due to using the urge to void behavioral technique.  Baseline:  Goal status: INITIAL    PLAN:  PT FREQUENCY: 1-2x/week  PT DURATION: 12 weeks  PLANNED INTERVENTIONS: 97110-Therapeutic exercises, 97530- Therapeutic activity, [id number]- Neuromuscular re-education, 97535- Self Care, [id number]- Manual  therapy, Patient/Family education, Joint mobilization, and Biofeedback  PLAN FOR NEXT SESSION:  hip stretches, abdominal engagement, internal work, see if she is able to perform diaphragmatic breathing, review lifting techniques   Eulis Foster, PT 11/03/23 12:01 PM

## 2023-11-07 ENCOUNTER — Encounter: Payer: Self-pay | Admitting: Obstetrics

## 2023-11-09 ENCOUNTER — Ambulatory Visit (INDEPENDENT_AMBULATORY_CARE_PROVIDER_SITE_OTHER): Payer: Medicare Other

## 2023-11-09 ENCOUNTER — Other Ambulatory Visit (HOSPITAL_COMMUNITY)
Admission: RE | Admit: 2023-11-09 | Discharge: 2023-11-09 | Disposition: A | Discharge status: Home / Self Care | Payer: Medicare Other | Source: Other Acute Inpatient Hospital | Attending: Obstetrics | Admitting: Obstetrics

## 2023-11-09 ENCOUNTER — Other Ambulatory Visit: Payer: Self-pay | Admitting: Obstetrics

## 2023-11-09 VITALS — BP 169/101 | HR 88

## 2023-11-09 DIAGNOSIS — Z87440 Personal history of urinary (tract) infections: Secondary | ICD-10-CM

## 2023-11-09 DIAGNOSIS — R82998 Other abnormal findings in urine: Secondary | ICD-10-CM | POA: Diagnosis present

## 2023-11-09 DIAGNOSIS — R319 Hematuria, unspecified: Secondary | ICD-10-CM

## 2023-11-09 DIAGNOSIS — N390 Urinary tract infection, site not specified: Secondary | ICD-10-CM

## 2023-11-09 DIAGNOSIS — R300 Dysuria: Secondary | ICD-10-CM

## 2023-11-09 LAB — URINALYSIS, ROUTINE W REFLEX MICROSCOPIC
Bilirubin Urine: NEGATIVE
Glucose, UA: NEGATIVE mg/dL
Ketones, ur: NEGATIVE mg/dL
Nitrite: POSITIVE — AB
Protein, ur: 100 mg/dL — AB
Specific Gravity, Urine: 1.017 (ref 1.005–1.030)
WBC, UA: >50 WBC/hpf (ref 0–5)
pH: 6 (ref 5.0–8.0)

## 2023-11-09 LAB — POCT URINALYSIS DIPSTICK
Bilirubin, UA: NEGATIVE
Glucose, UA: NEGATIVE
Ketones, UA: NEGATIVE
Nitrite, UA: POSITIVE
Protein, UA: POSITIVE — AB
Spec Grav, UA: 1.02 (ref 1.010–1.025)
Urobilinogen, UA: 0.2 U/dL
pH, UA: 6.5 (ref 5.0–8.0)

## 2023-11-09 MED ORDER — CEPHALEXIN 500 MG PO CAPS
500.0000 mg | ORAL_CAPSULE | Freq: Four times a day (QID) | ORAL | 0 refills | Status: AC
Start: 2023-11-09 — End: 2023-11-16

## 2023-11-09 MED ORDER — TRIMETHOPRIM 100 MG PO TABS
100.0000 mg | ORAL_TABLET | Freq: Two times a day (BID) | ORAL | 2 refills | Status: DC
Start: 2023-11-09 — End: 2023-12-19

## 2023-11-09 MED ORDER — CEPHALEXIN 500 MG PO CAPS: 500.0000 mg | ORAL_CAPSULE | Freq: Two times a day (BID) | ORAL | 0 refills | Status: AC

## 2023-11-09 MED ORDER — TRIMETHOPRIM 100 MG PO TABS: 100.0000 mg | ORAL_TABLET | Freq: Two times a day (BID) | ORAL | 2 refills | Status: DC

## 2023-11-09 NOTE — Progress Notes (Signed)
 Most recent culture resistant to macrobid.  Rx Keflex 1 tab BID x 7 days. Start trimpex daily after completion of keflex to prevention UTI for 3 months.

## 2023-11-09 NOTE — Progress Notes (Signed)
 Thia arrived today with UTI sx.  Per Dr. Jari Favre protocol: A urine specimen was collected and POCT Urine was done and urine culture and a microscopic urine sent to the lab. POCT Urine was POSITIVE for blood, Nitrates and leukocytes. Pt. was notified and prescriptions for Keflex and Trimpex have been sent to the preferred pharmacy.

## 2023-11-09 NOTE — Patient Instructions (Signed)
 Your Urine dip that was done in office was Positive . I am sending the urine off for culture and you can take AZO over the counter for your discomfort.  We have ordered Keflex for you to take while we wait for your culture results, hopefully this gives you some relief. We will contact you when the results are back between 3-5 days.  If a different antibiotic is needed we will sent the order to the pharmacy and you will be notified. If you have any questions or concerns please feel free to call us at [phone]   It was a pleasure to see you today!  Thank you for trusting me with your care!

## 2023-11-10 ENCOUNTER — Ambulatory Visit: Payer: Medicare Other | Admitting: Neurology

## 2023-11-10 ENCOUNTER — Encounter: Payer: Self-pay | Admitting: Neurology

## 2023-11-10 VITALS — BP 112/80 | HR 82 | Ht 68.0 in | Wt 184.0 lb

## 2023-11-10 DIAGNOSIS — R0683 Snoring: Secondary | ICD-10-CM

## 2023-11-10 DIAGNOSIS — R61 Generalized hyperhidrosis: Secondary | ICD-10-CM | POA: Diagnosis not present

## 2023-11-10 DIAGNOSIS — R002 Palpitations: Secondary | ICD-10-CM | POA: Diagnosis not present

## 2023-11-10 DIAGNOSIS — I350 Nonrheumatic aortic (valve) stenosis: Secondary | ICD-10-CM

## 2023-11-10 DIAGNOSIS — G4762 Sleep related leg cramps: Secondary | ICD-10-CM | POA: Diagnosis not present

## 2023-11-10 NOTE — Progress Notes (Signed)
 SLEEP MEDICINE CLINIC    Provider:  Melvyn Novas, MD  Primary Care Physician:  Kaleen Mask, MD [address] [place] [address]     Referring Provider:  [facility] , [place], [place], [place] [address] [place],  [place] [address]          Chief Complaint according to patient   Patient presents with:     New Patient (Initial Visit)           HISTORY OF PRESENT ILLNESS:  [name] is a 78 y.o. female patient who is seen upon referral on 11/10/2023 from Dr Olena Leatherwood, MD,  for an evaluation of sleep problems.  Chief concern according to patient :  "Chronic insomnia, difficulties to go to sleep and stay asleep."  I have arthritis pain and can't easily relax.  Her BP has been elevated for the last 12 months,  She wakes up sweating at night , leaking heart valve,  aortic calcifications." I snore and my husband reports this to me, but he is almost deaf - and he can hear it. "    I have the pleasure of seeing [name] on 11/10/23 , a right -handed female here to be evaluated for an organic sleep disorder.     Sleep relevant medical history: Nocturia 1 time, edentulous, implants- had severe gingivitis.  Carries a dx of PMR (polymyalgia rheumatica) (HCC). Pre diabetes .    Family medical /sleep history: Heart disease, father died of aneurysm, aortic.  Mother had CAD and renal disease, brother died at 110 and died of  CHF , had a bleed in the ICU.  No family member on CPAP with OSA.    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 3 adult children.  Pets: dog and cat  Tobacco use: none .  ETOH use ; none ,  Caffeine intake in form of Coffee( decaff 1-2 cups ) Soda( decaff) Tea ( /) or energy drinks Exercise in form of walking.   Hobbies :baking      Sleep habits are as follows: The patient's dinner time is between 5-6 PM. The patient goes to bed at 10 PM  and  has a difficult time to sleep- sometimes  taking hot shower,  tylenol PM, and the room is cool, quiet and dark.  Continues to sleep for 2-3 hours, wakes  at 1 AM  and takes a  bathroom break. She may stay awake until 2-3 AM and if she is able to return to sleep, only for 1-2 hours. Total sleep time 5 hours orl ess.     The preferred sleep position is laterally, with the support of 1 pillow, flat bed.  Dreams are reportedly infrequent/vivid.   The patient wakes up spontaneously/ with an alarm.   AM is the usual rise time. He/ She reports not feeling refreshed or restored in AM, with symptoms such as dry mouth, morning headaches, and residual fatigue.  Naps are taken infrequently, she dozes off after lunch, not intentionally.  lasting from 10 to 20 minutes and are not  refreshing.     Review of Systems: Out of a complete 14 system review, the patient complains of only the following symptoms, and all other reviewed systems are negative.:  Fatigue, sleepiness , snoring, fragmented sleep,  chronic Insomnia,  How likely  are you to doze in the following situations: 0 = not likely, 1 = slight chance, 2 = moderate chance, 3 = high chance   Sitting and Reading? Watching Television? Sitting inactive in a public place (theater or meeting)? As a passenger in a car for an hour without a break? Lying down in the afternoon when circumstances permit? Sitting and talking to someone? Sitting quietly after lunch without alcohol? In a car, while stopped for a few minutes in traffic?   Total = 11/ 24 points   FSS endorsed at 16/ 63 points.   GDS 2/ 15   Social History   Socioeconomic History   Marital status: Married    Spouse name: Not on file   Number of children: 3   Years of education: 14   Highest education level: Not on file  Occupational History   Occupation: retired  Tobacco Use   Smoking status: Never    Passive exposure: Past   Smokeless tobacco: Never  Vaping Use   Vaping  status: Never Used  Substance and Sexual Activity   Alcohol use: No    Alcohol/week: 0.0 standard drinks of alcohol   Drug use: No   Sexual activity: Not Currently    Partners: Male    Birth control/protection: Surgical    Comment: Hyst, less than 5 lifetime partners, sexual debut 78yo, no hx cancer, no hx STIs, no hx DES exposure  Other Topics Concern   Not on file  Social History Narrative   Lives at home with husband . One story home. Right handed   Social Drivers of Corporate investment banker Strain: Not on file  Food Insecurity: Not on file  Transportation Needs: Not on file  Physical Activity: Not on file  Stress: Not on file  Social Connections: Not on file    Family History  Problem Relation Age of Onset   Coronary artery disease Mother 51   Hypertension Mother    Aortic aneurysm Father    Hypertension Father    Hyperlipidemia Brother        brain and kidney tumor, no treatment needed    Aneurysm Brother    Healthy Daughter    Healthy Daughter    Healthy Daughter    Bladder Cancer Neg Hx    Uterine cancer Neg Hx     Past Medical History:  Diagnosis Date   Bilateral ovarian cysts 09/12/2014   bilateral simple ovarian cysts.  [place] due in January 2018.   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 x 20 yr.   Hyperlipidemia    Hypothyroid    IBS (irritable bowel syndrome)    Impingement syndrome of right shoulder 07/2013   Osteoporosis    Polymyalgia rheumatica (HCC)    5-28  states she has been on prednisone and now is trying to only be on plaquenil to manage the polymyalgia    PONV (postoperative nausea and vomiting)    Primary localized osteoarthritis of right knee    Rupture of right rotator cuff 07/2013   VIN I (vulvar intraepithelial neoplasia I) 2017, 2018   this is from a perineal/vulvar biopsy.   Vitamin B12 deficiency     Past Surgical History:   Procedure Laterality Date   ANTERIOR LAT LUMBAR FUSION  10/08/2008   L3-4   BACK SURGERY     BACK SURGERY  10/03/2014   --Dr.  Trey Sailors at [facility]   CATARACT EXTRACTION, BILATERAL     03/2023, 04/2023   COLONOSCOPY W/ BIOPSIES  11/2009   polyps   COLONOSCOPY W/ BIOPSIES  11/2012   polyp, recheck in 3 years   CYSTO  07/05/2006   CYSTOCELE REPAIR  07/05/2006   ESOPHAGOGASTRODUODENOSCOPY ENDOSCOPY  05/25/2005   erythema and edema of gastric lumen   EYE SURGERY Right    muscle adjustment    FOOT SURGERY Right    GANGLION CYST EXCISION Right    wrist   INCONTINENCE SURGERY  07/05/2006   cystourethropexy   ORIF FOREARM FRACTURE Right 09/2012   SHOULDER ARTHROSCOPY WITH ROTATOR CUFF REPAIR AND SUBACROMIAL DECOMPRESSION Right 08/02/2013   Procedure: SHOULDER ARTHROSCOPY WITH ROTATOR CUFF REPAIR AND SUBACROMIAL DECOMPRESSION, EXTENSIVE DEBRIDEMENT (INCLUDING LABRUM), PARTIAL ACROMIPLASY WITH CORACOACROMIAL RELEASE, DISTAL CLAVICULECTOMY;  Surgeon: Loreta Ave, MD;  Location: [facility];  Service: Orthopedics;  Laterality: Right;   STRABISMUS SURGERY Right 01/26/2008   2 other surgeries previously   STRABISMUS SURGERY Right 06/03/2017   Procedure: REPAIR STRABISMUS RIGHT EYE;  Surgeon: Verne Carrow, MD;  Location: [facility];  Service: Ophthalmology;  Laterality: Right;   Thumb surgery  2/13 & ?2010   Bilateral - states a joint was replaced   TOTAL KNEE ARTHROPLASTY Right 02/12/2019   Procedure: TOTAL KNEE ARTHROPLASTY;  Surgeon: Salvatore Marvel, MD;  Location: [facility];  Service: Orthopedics;  Laterality: Right;   TOTAL KNEE REVISION Right 05/12/2022   Procedure: Right knee polyethylene versus total knee arthroplasty revision;  Surgeon: Ollen Gross, MD;  Location: [facility];  Service: Orthopedics;  Laterality: Right;   TOTAL VAGINAL HYSTERECTOMY  26 - or age 3   secondary AUB   TUBAL LIGATION     laparosopic BTL age 50   varicose veins  Bilateral      Current Outpatient Medications on File Prior to Visit  Medication Sig Dispense Refill   Acetaminophen (TYLENOL PO) Take by mouth as needed.     benazepril (LOTENSIN) 40 MG tablet Take 40 mg by mouth daily.      Calcium Carb-Cholecalciferol (CALCIUM 600 + D PO) Take 1 tablet by mouth daily.     cephALEXin (KEFLEX) 500 MG capsule Take 1 capsule (500 mg total) by mouth 2 (two) times daily for 7 days. 14 capsule 0   cephALEXin (KEFLEX) 500 MG capsule Take 1 capsule (500 mg total) by mouth 4 (four) times daily for 7 days. 28 capsule 0   cyanocobalamin (VITAMIN B12) 1000 MCG tablet Take 1,000 mcg by mouth daily.     estradiol (ESTRACE) 0.1 MG/GM vaginal cream Place 0.5 g vaginally 2 (two) times a week. Place 0.5g nightly for two weeks then twice a week after 30 g 3   ibandronate (BONIVA) 150 MG tablet Take 150 mg by mouth every 30 (thirty) days. Take in the morning with a full glass of water, on an empty stomach, and do not take anything else by mouth or lie down for the next 30 min.     levothyroxine (SYNTHROID) 125 MCG tablet Take 125 mcg by mouth daily before breakfast.     Magnesium 250 MG TABS Take 500 mg by mouth daily.     METAMUCIL FIBER PO Take 6 capsules by mouth daily.     moxifloxacin (VIGAMOX) 0.5 % ophthalmic solution Place 1 drop into the left eye 2 (two) times daily.     Multiple Vitamin (MULTIVITAMIN) tablet Take 1 tablet by mouth daily.  Multiple Vitamins-Minerals (PRESERVISION AREDS PO) Take by mouth daily.     Naproxen Sodium (ALEVE PO) Take by mouth as needed.     predniSONE (DELTASONE) 5 MG tablet Take 5 mg by mouth as needed. As needed for flare ups     trimethoprim (TRIMPEX) 100 MG tablet Take 1 tablet (100 mg total) by mouth 2 (two) times daily. Start after completion of Keflex 30 tablet 2   No current facility-administered medications on file prior to visit.    Allergies  Allergen Reactions   Penicillins Itching    OF MOUTH Did it involve swelling  of the face/tongue/throat, SOB, or low BP? Unknown Did it involve sudden or severe rash/hives, skin peeling, or any reaction on the inside of your mouth or nose? Unknown Did you need to seek medical attention at a hospital or doctor's office? Unknown When did it last happen?     in my 7s If all above answers are "NO", may proceed with cephalosporin use. Tolerated Cephalosporin Date: 05/13/22.     Sulfa Antibiotics Itching    OF MOUTH     DIAGNOSTIC DATA (LABS, IMAGING, TESTING) - I reviewed patient records, labs, notes, testing and imaging myself where available.  Lab Results  Component Value Date   WBC 13.8 (H) 05/13/2022   HGB 10.9 (L) 05/13/2022   HCT 35.4 (L) 05/13/2022   MCV 90.5 05/13/2022   PLT 403 (H) 05/13/2022      Component Value Date/Time   NA 141 05/13/2022 0408   NA 143 12/31/2021 1615   K 3.9 05/13/2022 0408   CL 112 (H) 05/13/2022 0408   CO2 24 05/13/2022 0408   GLUCOSE 171 (H) 05/13/2022 0408   BUN 13 05/13/2022 0408   BUN 13 12/31/2021 1615   CREATININE 0.67 05/13/2022 0408   CREATININE 0.69 06/30/2021 1044   CALCIUM 8.7 (L) 05/13/2022 0408   PROT 7.1 06/30/2021 1044   ALBUMIN 4.1 02/08/2019 0930   AST 20 06/30/2021 1044   ALT 15 06/30/2021 1044   ALKPHOS 73 02/08/2019 0930   BILITOT 0.2 06/30/2021 1044   GFRNONAA >60 05/13/2022 0408   GFRNONAA 89 10/27/2020 1020   GFRAA 103 10/27/2020 1020   Lab Results  Component Value Date   CHOL 170 02/16/2012   HDL 57.30 02/16/2012   LDLCALC 98 02/16/2012   TRIG 72.0 02/16/2012   CHOLHDL 3 02/16/2012   No results found for: "HGBA1C" No results found for: "VITAMINB12" Lab Results  Component Value Date   TSH 1.650 12/31/2021    PHYSICAL EXAM:  Today's Vitals   11/10/23 0902  BP: 112/80  Pulse: 82  Weight: 184 lb (83.5 kg)  Height: 5\' 8"  (1.727 m)   Body mass index is 27.98 kg/m.   Wt Readings from Last 3 Encounters:  11/10/23 184 lb (83.5 kg)  09/22/23 181 lb (82.1 kg)  09/01/23 181 lb  6.4 oz (82.3 kg)     Ht Readings from Last 3 Encounters:  11/10/23 5\' 8"  (1.727 m)  09/22/23 5\' 7"  (1.702 m)  09/01/23 5\' 7"  (1.702 m)      General: The patient is awake, alert and appears not in acute distress. The patient is well groomed. Head: Normocephalic, atraumatic. Neck is supple.  Mallampati 3,  neck circumference:13.5 inches . Nasal airflow patent.   Retrognathia is not seen.  Dental status:  implants  Cardiovascular:  Regular rate and cardiac rhythm by pulse,  without distended neck veins. Respiratory: Lungs are clear to auscultation.  Skin:  Without evidence of ankle edema, or rash. Trunk: The patient's posture is erect.   NEUROLOGIC EXAM: The patient is awake and alert, oriented to place and time.   Memory subjective described as intact.  Attention span & concentration ability appears normal.  Speech is fluent,  without  dysarthria, dysphonia or aphasia.  Mood and affect are appropriate.   Cranial nerves: no loss of smell or taste reported  Pupils are equal and briskly reactive to light. Funduscopic exam deferred.  Extraocular movements in vertical and horizontal planes were intact and without nystagmus. No Diplopia. Visual fields by finger perimetry are intact. Hearing was intact to soft voice and finger rubbing.  Facial sensation intact to fine touch.  Facial motor strength is symmetric and tongue and uvula move midline.  Neck ROM : rotation, tilt and flexion extension were normal for age and shoulder shrug was symmetrical.    Motor exam:  Symmetric bulk, tone and ROM.   Normal tone without cog wheeling, symmetric grip strength .   Sensory:  Fine touch and vibration ; intact  Proprioception tested in the upper extremities was normal.   Coordination: Rapid alternating movements in the fingers/hands were of normal speed.  The Finger-to-nose maneuver was intact without evidence of ataxia, dysmetria or tremor.   Gait and station: Patient could rise unassisted  from a seated position, walked without assistive device.   Status post 3 lower back surgeries.  Deep tendon reflexes: in the  upper and lower extremities are symmetric and intact.    ASSESSMENT AND PLAN 78 y.o. year old female  here with:      1) snoring, possible apnea and nocturnal  palpitations,  diaphoresis.  Waking up after 2 hours of sleep unclear why.   2)  chronic sleep initiation insomnia, "mind is busy ".failed melatonin, trazodone, elavil, and now takes tylenol PM.   3)  life long short sleeper, 4-5 hours max sleep time- even as a teenager and as a young mother - and she was not sleep restricted.   I prefer an in lab sleep study due to recently increased blood pressure, more frequent  nocturnal diaphoresis and her dx of heart disease.  I need to look at cardiac rhythm , and at  the cause of arousals.   I plan to follow up either personally or through our NP if the sleep study reveals an organic sleep disorder. within 3-5 months.   I would like to thank Kaleen Mask, MD and Wyatt Haste T, Md [address] [place],  [place] [address] for allowing me to meet with and to take care of this pleasant patient.    After spending a total time of  35  minutes face to face and additional time for physical and neurologic examination, review of laboratory studies,  personal review of imaging studies, reports and results of other testing and review of referral information / records as far as provided in visit,   Electronically signed by: Melvyn Novas, MD 11/10/2023 9:17 AM  [facility] and [facility] Sleep Board certified by The [employer] of Sleep Medicine and Diplomate of the [employer] of Sleep Medicine. Board certified In Neurology through the ABPN, Fellow of the [employer] of Neurology.

## 2023-11-11 ENCOUNTER — Encounter: Payer: Self-pay | Admitting: Obstetrics

## 2023-11-11 LAB — URINE CULTURE: Culture: >=100000 — AB

## 2023-11-15 ENCOUNTER — Telehealth: Payer: Self-pay | Admitting: Neurology

## 2023-11-15 NOTE — Telephone Encounter (Signed)
 [employer] medicare no auth req.  Patient is scheduled at [facility] for 12/12/23 at 8 pm.  Mailed packet & sent mychart

## 2023-11-22 ENCOUNTER — Telehealth: Payer: Self-pay

## 2023-11-22 NOTE — Telephone Encounter (Signed)
@  [name]@ is called to provide pre-procedural instructions for her Cystoscopy. She is scheduled on 11/24/2023.  The patient is not having any worsening in her urinary patterns. The patient is not experiencing any UTI symptoms. A POCT urinalysis is to be performed in the office before the scheduled procedure as the procedure will need to be rescheduled if the results indicate an infection.  The patient has not been prescribed the pre-procedural antibiotics fThe patient is reminded to arrive 5 minutes prior to the scheduled appointment time and that a urine sample will need to be collected the upon arrival for the procedure.

## 2023-11-24 ENCOUNTER — Ambulatory Visit: Payer: Medicare Other | Admitting: Obstetrics

## 2023-11-24 ENCOUNTER — Encounter: Payer: Self-pay | Admitting: Obstetrics

## 2023-11-24 VITALS — BP 116/76 | HR 78

## 2023-11-24 DIAGNOSIS — R339 Retention of urine, unspecified: Secondary | ICD-10-CM

## 2023-11-24 DIAGNOSIS — N3946 Mixed incontinence: Secondary | ICD-10-CM

## 2023-11-24 DIAGNOSIS — N3289 Other specified disorders of bladder: Secondary | ICD-10-CM | POA: Diagnosis not present

## 2023-11-24 DIAGNOSIS — N8110 Cystocele, unspecified: Secondary | ICD-10-CM

## 2023-11-24 DIAGNOSIS — Z87440 Personal history of urinary (tract) infections: Secondary | ICD-10-CM | POA: Diagnosis not present

## 2023-11-24 DIAGNOSIS — R3129 Other microscopic hematuria: Secondary | ICD-10-CM | POA: Diagnosis not present

## 2023-11-24 DIAGNOSIS — Z9889 Other specified postprocedural states: Secondary | ICD-10-CM

## 2023-11-24 LAB — POCT URINALYSIS DIPSTICK
Bilirubin, UA: NEGATIVE
Glucose, UA: NEGATIVE
Ketones, UA: NEGATIVE
Leukocytes, UA: NEGATIVE
Nitrite, UA: NEGATIVE
Protein, UA: NEGATIVE
Spec Grav, UA: 1.02 (ref 1.010–1.025)
Urobilinogen, UA: 0.2 U/dL
pH, UA: 5.5 (ref 5.0–8.0)

## 2023-11-24 MED ORDER — PHENAZOPYRIDINE HCL 200 MG PO TABS: 200.0000 mg | ORAL_TABLET | Freq: Three times a day (TID) | ORAL | 0 refills | Status: DC | PRN

## 2023-11-24 NOTE — Progress Notes (Addendum)
 CYSTOSCOPY  CC:  This is a 77 y.o. with  microscopic hematuria, mixed urinary incontinence, history of recurrent UTI, incomplete bladder emptying, and history of midurethral sling  who presents today for cystoscopy.  Fitted for a size 3 cube pessary on 10/12/23, did not follow-up on 10/27/23 for pessary check since she removed 2 days later due to inability to void. Since removal, reports improved sensation of bladder emptying.  Undergoing pelvic floor PT Treated for UTI 11/09/23 with Keflex, culture >100K pansensitive E. Coli . Denies UTI symptoms today. 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   CLINICAL DATA:  Microscopic hematuria, possible kidney stones, pain radiating to back upon urination   EXAM: CT ABDOMEN WITHOUT AND WITH CONTRAST   TECHNIQUE: Multidetector CT imaging of the abdomen was performed following the standard protocol before and following the bolus administration of intravenous contrast.   RADIATION DOSE REDUCTION: This exam was performed according to the departmental dose-optimization program which includes automated exposure control, adjustment of the mA and/or kV according to patient size and/or use of iterative reconstruction technique.   CONTRAST:  OMNIPAQUE IOHEXOL 300 MG/ML  SOLN   COMPARISON:  None Available.   FINDINGS: Lower chest: No acute abnormality.  Small hiatal hernia.   Hepatobiliary: No solid liver abnormality is seen. Benign liver cysts requiring no further follow-up or characterization hepatic steatosis. No gallstones, gallbladder wall thickening, or biliary dilatation.   Pancreas: Unremarkable. No pancreatic ductal dilatation or surrounding inflammatory changes.   Spleen: Normal in size without significant abnormality.   Adrenals/Urinary Tract: Adrenal glands are unremarkable. Small nonobstructive calculus of the midportion of the right kidney (series 11,  image 58). No left-sided calculi or hydronephrosis. Parapelvic and renal cortical cysts, benign, requiring no further follow-up or characterization.   Stomach/Bowel: Stomach is within normal limits. No evidence of bowel wall thickening, distention, or inflammatory changes.   Vascular/Lymphatic: Aortic atherosclerosis. No enlarged abdominal lymph nodes.   Other: No abdominal wall hernia or abnormality. No ascites.   Musculoskeletal: No acute or significant osseous findings.   IMPRESSION: 1. Small nonobstructive calculus of the midportion of the right kidney. No left-sided calculi or hydronephrosis. 2. No renal mass or suspicious contrast enhancement. No upper urinary tract filling defect on delayed phase imaging. 3. Hepatic steatosis.   Aortic Atherosclerosis (ICD10-I70.0).     Electronically Signed   By: Jearld Lesch M.D.   On: 11/06/2023 07:22  Lab Results  Component Value Date   COLORU Yellow 11/09/2023   CLARITYU Cloudy 11/09/2023   GLUCOSEUR Negative 11/09/2023   BILIRUBINUR Negative 11/09/2023   BILIRUBINUR NEGATIVE 11/09/2023   KETONESU Negative 11/09/2023   SPECGRAV 1.020 11/09/2023   RBCUR Moderate 11/09/2023   PHUR 6.5 11/09/2023   PROTEINUR Positive (A) 11/09/2023   PROTEINUR 100 (A) 11/09/2023   UROBILINOGEN 0.2 11/09/2023   LEUKOCYTESUR Large (3+) (A) 11/09/2023   LEUKOCYTESUR LARGE (A) 11/09/2023     BP 116/76   Pulse 78   LMP 09/13/1984 (Approximate)   CYSTOSCOPY: A time out was performed.  The periurethral area was prepped and draped in a sterile manner.  2% lidocaine jetpack was inserted at the urethral meatus and the urethra and bladder visualized with 12- and 70-degree scopes.  She had diminished urethral coaptation and normal urethral mucosa.  She had abnormal  trabeculated  bladder mucosa with 2 ulcerations with white white scarring with radiating erythema noted at right bladder dome. She had  bilateral clear efflux from both ureteral orifices.  She  had no squamous metaplasia at the trigone, no cellules or diverticuli.      Media Information   Document Information  Photos  R lateral bladder  11/24/2023 10:09  Attached To:  Procedure visit on 11/24/23 with Loleta Chance, MD  Source Information  Loleta Chance, MD  [facility]  Document History     Media Information   Document Information  Photos  R bladder dome  11/24/2023 10:09  Attached To:  Procedure visit on 11/24/23 with Loleta Chance, MD  Source Information  Loleta Chance, MD  [facility]  Document History     Post Void Residual - 11/24/23 1045       Post Void Residual   Post Void Residual 171 mL            PVR prior to procedure was   ASSESSMENT:  78 y.o. with microscopic hematuria, recurrent UTI, and mixed urinary incontinence, incomplete bladder emptying, and history of midurethral sling . 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  PLAN:   - For irritative bladder we reviewed treatment options including altering her diet to avoid irritative beverages and foods as well as attempting to decrease stress and other exacerbating factors.  We also discussed using pyridium and similar over-the-counter medications for pain relief as needed. We discussed the pentad of medications including Elmiron, Tums, an antihistamine such as Vistaril, amitriptyline, and L-arginine.  We also discussed in-office bladder instillations for pain flares, as well as cystoscopy with hydrodistention in the operating room, which can be both diagnostic and therapeutic. She was also given information on the IC Network at https://www.ic-network.com for bladder diet suggestions and patient forums for support. - Rx pyridium - consider H2 blockers, reviewed bladder instillations - will proceed with cystoscopy, bladder biopsy and fulguration of bladder lesions - pt desires to proceed with concomitant pelvic organ prolapse repair since  she failed pessary trial and continues to display elevated PVR since PFPT. - For treatment of pelvic organ prolapse, we discussed options for management including expectant management, conservative management, and surgical management, such as Kegels, a pessary, pelvic floor physical therapy, and specific surgical procedures. - We discussed two options for prolapse repair:  1) vaginal repair without mesh - Pros - safer, no mesh complications - Cons - not as strong as mesh repair, higher risk of recurrence - serum Cr labwork prior to leaving the office - negative CT urogram  Follow-up to discuss findings and treatment options.  All questions answered and post-procedures instructions were given  Loleta Chance, MD

## 2023-11-24 NOTE — Patient Instructions (Addendum)
 Taking Care of Yourself after Urodynamics, Cystoscopy, Bulkamid Injection, or Botox Injection   Drink plenty of water for a day or two following your procedure. Try to have about 8 ounces (one cup) at a time, and do this 6 times or more per day unless you have fluid restrictitons AVOID irritative beverages such as coffee, tea, soda, alcoholic or citrus drinks for a day or two, as this may cause burning with urination.  For the first 1-2 days after the procedure, your urine may be pink or red in color. You may have some blood in your urine as a normal side effect of the procedure. Large amounts of bleeding or difficulty urinating are NOT normal. Call the nurse line if this happens or go to the nearest Emergency Room if the bleeding is heavy or you cannot urinate at all and it is after hours. If you had a Bulkamid injection in the urethra and need to be catheterized, ask for a pediatric catheter to be used (size 10 or 12-French) so the material is not pushed out of place.   You may experience some discomfort or a burning sensation with urination after having this procedure. You can use over the counter Azo or pyridium to help with burning and follow the instructions on the packaging. If it does not improve within 1-2 days, or other symptoms appear (fever, chills, or difficulty urinating) call the office to speak to a nurse.  You may return to normal daily activities such as work, school, driving, exercising and housework on the day of the procedure. If your doctor gave you a prescription, take it as ordered.   Please obtain your bloodwork prior to leaving today.    Today we talked about ways to manage bladder urgency such as altering your diet to avoid irritative beverages and foods (bladder diet) as well as attempting to decrease stress and other exacerbating factors.  You can also chew a plain Tums 1-3 times per day to make your urine less acidic, especially if you have eating/drinking acidic things.    There is a website with helpful information for people with bladder irritation, called the IC Network at https://www.ic-network.com. This website has more information about a healthy bladder diet and patient forums for support.  The Most Bothersome Foods* The Least Bothersome Foods*  Coffee - Regular & Decaf Tea - caffeinated Carbonated beverages - cola, non-colas, diet & caffeine-free Alcohols - Beer, Red Wine, White Wine, [address] - Grapefruit, [place], Orange, [employer] - Cranberry, Grapefruit, Orange, Pineapple Vegetables - Tomato & Tomato Products Flavor Enhancers - Hot peppers, Spicy foods, Chili, Horseradish, Vinegar, Monosodium glutamate (MSG) Artificial Sweeteners - NutraSweet, Sweet 'N Low, Equal (sweetener), Saccharin Ethnic foods - [place], [place], [place] food [employer] - low-fat & whole Fruits - Bananas, Blueberries, Honeydew melon, Pears, Raisins, Watermelon Vegetables - Broccoli, [address] Sprouts, [place], Carrots, Cauliflower, [place], Cucumber, Mushrooms, Peas, Radishes, Squash, Zucchini, White potatoes, Sweet potatoes & yams Poultry - Chicken, Eggs, [place], [employer] - Beef, Diplomatic Services operational officer, Lamb Seafood - Shrimp, [place] fish, Salmon Grains - Oat, Rice Snacks - Pretzels, Popcorn  *Lenward Chancellor et al. Diet and its role in interstitial cystitis/bladder pain syndrome (IC/BPS) and comorbid conditions. [employer]. BJU Int. 2012 Jan 11.    We will contact you to schedule cystoscopy, bladder biopsy and fulguration of bladder ulcers.

## 2023-11-24 NOTE — Addendum Note (Signed)
 Addended by: Alexis Frock on: 11/24/2023 03:03 PM   Modules accepted: Orders

## 2023-11-25 ENCOUNTER — Encounter: Payer: Self-pay | Admitting: Obstetrics

## 2023-11-25 LAB — CREATININE, SERUM
Creatinine, Ser: 0.6 mg/dL (ref 0.57–1.00)
eGFR: 94 mL/min/{1.73_m2} (ref >59)

## 2023-11-25 NOTE — Progress Notes (Signed)
 Office Visit Note  Patient: [name]             Date of Birth: 13-Jul-1946           MRN: [id number]             PCP: Kaleen Mask, MD Referring: Kaleen Mask, * Visit Date: 12/07/2023 Occupation: @GUAROCC @  Subjective:  Pain in joints  History of Present Illness: LEONILDA COZBY is a 78 y.o. female with polymyalgia rheumatica and osteoarthritis.  She returns today after her last visit in September 2024.  She continues to have discomfort in her left shoulder.  She had left shoulder joint injection in September 2024.  The shoulder joint pain improved after the injection and then return.  She is also having discomfort in the right trochanteric bursa.  The pain is worse when she is laying down on the right side.  Her right knee which is replaced and still causes discomfort.  Lower back pain is manageable.  She states she is starting glucosamine.  She had a recent flare of fibromyalgia which she gradually improved.  She was diagnosed with interstitial cystitis this year.  She has been seeing a urologist now.  She has been taking calcium and vitamin D and stays active for osteopenia.    Activities of Daily Living:  Patient reports morning stiffness for a few minutes.   Patient Reports nocturnal pain.  Difficulty dressing/grooming: Denies Difficulty climbing stairs: Reports Difficulty getting out of chair: Reports Difficulty using hands for taps, buttons, cutlery, and/or writing: Denies  Review of Systems  Constitutional:  Positive for fatigue.  HENT:  Negative for mouth sores and mouth dryness.   Eyes:  Negative for dryness.  Respiratory:  Negative for shortness of breath.   Cardiovascular:  Negative for chest pain and palpitations.  Gastrointestinal:  Negative for blood in stool, constipation and diarrhea.  Endocrine: Negative for increased urination.  Genitourinary:  Negative for involuntary urination.  Musculoskeletal:  Positive for joint pain, joint  pain, myalgias, muscle weakness, morning stiffness, muscle tenderness and myalgias. Negative for gait problem and joint swelling.  Skin:  Negative for color change, rash, hair loss and sensitivity to sunlight.  Allergic/Immunologic: Negative for susceptible to infections.  Neurological:  Negative for dizziness and headaches.  Hematological:  Negative for swollen glands.  Psychiatric/Behavioral:  Positive for sleep disturbance. Negative for depressed mood. The patient is not nervous/anxious.     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   Precordial chest pain 12/31/2021   Primary localized osteoarthritis of right knee 10/19/2018   Right foot pain 10/19/2018   Incontinence of feces 09/21/2018   Vulvar lesion 07/20/2018   History of repair of right rotator cuff 03/03/2017   Primary osteoarthritis of both hands/ has had bilateral CMC surgery  03/03/2017   History of hypertension 03/03/2017   History of hyperlipidemia 03/03/2017   DDD (degenerative disc disease),  lumbar/ history of lumbar surgery  03/03/2017   Osteopenia of multiple sites 03/03/2017   Chronic left shoulder pain 01/20/2017   Primary osteoarthritis of both knees 01/20/2017   Trapezius muscle spasm 01/20/2017   Insomnia 01/20/2017   Fatigue 01/20/2017   Fibromyalgia 01/20/2017   High risk medications (not anticoagulants)  long-term use 01/20/2017   PMR (polymyalgia rheumatica) (HCC) 01/20/2017   Left ovarian cyst 06/02/2015   Degenerative joint disease, shoulder, right 08/02/2013   Precordial pain 02/11/2012   HTN (hypertension) 02/11/2012   Hyperlipidemia 02/11/2012    Past Medical History:  Diagnosis Date   Bilateral ovarian cysts 09/12/2014   bilateral simple ovarian cysts.  [place] due in January 2018.   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 x 20 yr.   Hyperlipidemia    Hypothyroid    IBS (irritable bowel syndrome)    Impingement syndrome of right shoulder 07/2013   Osteoporosis    Polymyalgia rheumatica (HCC)    5-28  states she has been on prednisone and now is trying to only be on plaquenil to manage the polymyalgia    PONV (postoperative nausea and vomiting)    Primary localized osteoarthritis of right knee    Rupture of right rotator cuff 07/2013   VIN I (vulvar intraepithelial neoplasia I) 2017, 2018   this is from a perineal/vulvar biopsy.   Vitamin B12 deficiency     Family History  Problem Relation Age of Onset   Coronary artery disease Mother 28   Hypertension Mother    Aortic aneurysm Father    Hypertension Father    Hyperlipidemia Brother        brain and kidney tumor, no treatment needed    Aneurysm Brother    Healthy Daughter    Healthy Daughter    Healthy Daughter    Bladder Cancer Neg Hx    Uterine cancer Neg Hx    Past Surgical History:  Procedure Laterality Date   ANTERIOR LAT LUMBAR FUSION  10/08/2008   L3-4   BACK SURGERY     BACK SURGERY  10/03/2014   --Dr. Trey Sailors at [facility]   CATARACT EXTRACTION, BILATERAL     03/2023, 04/2023   COLONOSCOPY W/ BIOPSIES  11/2009   polyps   COLONOSCOPY W/ BIOPSIES  11/2012   polyp, recheck in 3 years   CYSTO  07/05/2006   CYSTOCELE REPAIR  07/05/2006   ESOPHAGOGASTRODUODENOSCOPY ENDOSCOPY  05/25/2005   erythema  and edema of gastric lumen   EYE SURGERY Right    muscle adjustment    FOOT SURGERY Right    GANGLION CYST EXCISION Right    wrist   INCONTINENCE SURGERY  07/05/2006   cystourethropexy   ORIF FOREARM FRACTURE Right 09/2012   SHOULDER ARTHROSCOPY WITH ROTATOR CUFF REPAIR AND SUBACROMIAL DECOMPRESSION Right 08/02/2013   Procedure: SHOULDER ARTHROSCOPY WITH ROTATOR CUFF REPAIR AND SUBACROMIAL DECOMPRESSION, EXTENSIVE DEBRIDEMENT (INCLUDING LABRUM), PARTIAL ACROMIPLASY WITH CORACOACROMIAL RELEASE, DISTAL CLAVICULECTOMY;  Surgeon: Loreta Ave, MD;  Location: [facility];  Service: Orthopedics;  Laterality: Right;   STRABISMUS SURGERY Right 01/26/2008   2 other surgeries previously   STRABISMUS SURGERY Right 06/03/2017   Procedure: REPAIR STRABISMUS RIGHT EYE;  Surgeon: Verne Carrow, MD;  Location: [facility];  Service: Ophthalmology;  Laterality: Right;   Thumb surgery  2/13 & ?2010   Bilateral - states a joint was  replaced   TOTAL KNEE ARTHROPLASTY Right 02/12/2019   Procedure: TOTAL KNEE ARTHROPLASTY;  Surgeon: Salvatore Marvel, MD;  Location: [facility];  Service: Orthopedics;  Laterality: Right;   TOTAL KNEE REVISION Right 05/12/2022   Procedure: Right knee polyethylene versus total knee arthroplasty revision;  Surgeon: Ollen Gross, MD;  Location: [facility];  Service: Orthopedics;  Laterality: Right;   TOTAL VAGINAL HYSTERECTOMY  38 - or age 32   secondary AUB   TUBAL LIGATION     laparosopic BTL age 79   varicose veins Bilateral    Social History   Social History Narrative   Lives at home with husband . One story home. Right handed   Immunization History  Administered Date(s) Administered   Influenza-Unspecified 08/04/2017   PFIZER(Purple Top)SARS-COV-2 Vaccination 11/15/2019, 12/12/2019, 06/16/2020   Pneumococcal Conjugate-13 05/14/2014, 05/15/2015   Tdap 09/13/2012, 10/19/2022     Objective: Vital Signs: BP 114/79 (BP Location: Left Arm,  Patient Position: Sitting, Cuff Size: Normal)   Pulse 70   Resp 13   Ht 5\' 8"  (1.727 m)   Wt 185 lb 6.4 oz (84.1 kg)   LMP 09/13/1984 (Approximate)   BMI 28.19 kg/m    Physical Exam Vitals and nursing note reviewed.  Constitutional:      Appearance: She is well-developed.  HENT:     Head: Normocephalic and atraumatic.  Eyes:     Conjunctiva/sclera: Conjunctivae normal.  Cardiovascular:     Rate and Rhythm: Normal rate and regular rhythm.     Heart sounds: Normal heart sounds.  Pulmonary:     Effort: Pulmonary effort is normal.     Breath sounds: Normal breath sounds.  Abdominal:     General: Bowel sounds are normal.     Palpations: Abdomen is soft.  Musculoskeletal:     Cervical back: Normal range of motion.  Lymphadenopathy:     Cervical: No cervical adenopathy.  Skin:    General: Skin is warm and dry.     Capillary Refill: Capillary refill takes less than 2 seconds.  Neurological:     Mental Status: She is alert and oriented to person, place, and time.  Psychiatric:        Behavior: Behavior normal.      Musculoskeletal Exam: She had limited range of motion of the cervical spine without discomfort.  She had discomfort range of motion of her left shoulder joint with abduction about 140 degrees and limited internal rotation.  Right shoulder joint which was replaced was in good range of motion.  Elbow joints were in good range of motion.  She had bilateral CMC thickening and subluxation.  PIP and DIP thickening was noted.  She had good range of motion of bilateral hip joints.  She had tenderness over right trochanteric bursa.  Right knee joint was replaced with limited extension and some warmth.  Left knee joint was in good range of motion.  There was no tenderness over ankles or MTPs.  CDAI Exam: CDAI Score: -- Patient Global: --; Provider Global: -- Swollen: --; Tender: -- Joint Exam 12/07/2023   No joint exam has been documented for this visit   There is currently  no information documented on the homunculus. Go to the Rheumatology activity and complete the homunculus joint exam.  Investigation: No additional findings.  Imaging: No results found.  Recent Labs: Lab Results  Component Value Date   WBC 13.8 (H) 05/13/2022   HGB 10.9 (L) 05/13/2022   PLT 403 (H) 05/13/2022   NA  141 05/13/2022   K 3.9 05/13/2022   CL 112 (H) 05/13/2022   CO2 24 05/13/2022   GLUCOSE 171 (H) 05/13/2022   BUN 13 05/13/2022   CREATININE 0.60 11/24/2023   BILITOT 0.2 06/30/2021   ALKPHOS 73 02/08/2019   AST 20 06/30/2021   ALT 15 06/30/2021   PROT 7.1 06/30/2021   ALBUMIN 4.1 02/08/2019   CALCIUM 8.7 (L) 05/13/2022   GFRAA 103 10/27/2020    Speciality Comments: PLQ Eye Exam: 04/07/2020 WNL @ [facility] Follow up in 1 year  Procedures:  Large Joint Inj: L glenohumeral on 12/07/2023 10:08 AM Indications: pain Details: 27 G 1.5 in needle, posterior approach  Arthrogram: No  Medications: 1.5 mL lidocaine 1 %; 40 mg triamcinolone acetonide 40 MG/ML Aspirate: 0 mL Outcome: tolerated well, no immediate complications Procedure, treatment alternatives, risks and benefits explained, specific risks discussed. Consent was given by the patient. Immediately prior to procedure a time out was called to verify the correct patient, procedure, equipment, support staff and site/side marked as required. Patient was prepped and draped in the usual sterile fashion.     Allergies: Penicillins and Sulfa antibiotics   Assessment / Plan:     Visit Diagnoses: PMR (polymyalgia rheumatica) (HCC)-in remission.  She has no increased muscular weakness or tenderness. PLQ d/c Feb 2021 due to not noticing any clinical benefit.  Patient states she took prednisone 5 mg only once in the last year.  Use of prednisone use was discouraged.  Primary osteoarthritis of right shoulder-she has surgery on her right shoulder in the past.  She had good range of motion of her right  shoulder.  History of repair of right rotator cuff  Chronic left shoulder pain -she had discomfort with abduction and internal rotation of the right shoulder joint with abduction about 140 degrees.  She had very good response to the previous cortisone injection on June 07, 2023.  She requested a repeat injection..  X-rays of the left shoulder unremarkable on 06/30/2021.  After informed consent was obtained and side effects were discussed left shoulder joint was injected with lidocaine and Kenalog as described above.  She tolerated the procedure well.  Postprocedure instructions were given.. - Plan: Large Joint Inj.  A handout on shoulder joint exercises was given.  Primary osteoarthritis of both hands/ has had bilateral CMC surgery -she has bilateral CMC subluxation, bilateral PIP and DIP thickening.  Joint protection muscle strengthening was discussed.  A handout on hand exercises was given.  S/P TKR (total knee replacement), right - Dr. Caroll Rancher continues to have chronic pain and discomfort in her right knee.  She had limited extension and warmth on palpation.  Trochanteric bursitis, right hip-she has been experiencing discomfort in the right trochanteric region.  A handout on IT band stretches was given.  I also advised her to contact us if she needs an injection in the future.  Primary osteoarthritis of left knee-she had good range of motion of her left knee joint without discomfort.  Spondylosis of lumbar spine - S/p surgery in the past.  She states the lower back pain has been better since she has been on glucosamine.  Chronic SI joint pain  Trapezius muscle spasm-stretching exercises were discussed.  Fibromyalgia-she continues to have some generalized pain and discomfort.  Patient's reports having recent flare of fibromyalgia.  Need for regular exercise and stretching was emphasized.  Other fatigue-related to fibromyalgia and insomnia.  Other insomnia-good sleep was  discussed.  Interstitial cystitis-patient reports frequency  of urination.  She was recently diagnosed with interstitial cystitis.  She has been followed closely by urologist.  Osteopenia of multiple sites - DEXA updated December 2023-records not in Epic to review. Previous DEXA updated on 08/27/2020: Patient remains in the osteopenia range.Dr. Talmage Nap  History of hypertension-blood pressure was normal at 114/79.  History of hyperlipidemia  Orders: Orders Placed This Encounter  Procedures   Large Joint Inj   No orders of the defined types were placed in this encounter.    Follow-Up Instructions: Return in about 6 months (around 06/08/2024) for PMR, OA.   Pollyann Savoy, MD  Note - This record has been created using Animal nutritionist.  Chart creation errors have been sought, but may not always  have been located. Such creation errors do not reflect on  the standard of medical care.

## 2023-12-06 ENCOUNTER — Encounter: Payer: Self-pay | Admitting: Physical Therapy

## 2023-12-06 ENCOUNTER — Encounter: Payer: Self-pay | Attending: Obstetrics | Admitting: Physical Therapy

## 2023-12-06 DIAGNOSIS — M6281 Muscle weakness (generalized): Secondary | ICD-10-CM | POA: Diagnosis present

## 2023-12-06 DIAGNOSIS — R278 Other lack of coordination: Secondary | ICD-10-CM | POA: Diagnosis present

## 2023-12-06 NOTE — Patient Instructions (Signed)
 About Pelvic Support Problems Pelvic Support Problems Explained Ligaments, muscles, and connective tissue normally hold your bladder, uterus, and other organs in their proper places in your pelvis. When these tissues become weak, a problem with pelvic support may result. Weak support can cause one or more of the pelvic organs to drop down into the vagina. An organ may even drop so far that is partially exposed outside the body.  Pelvic support problems are named by the change in the organ. The main types of pelvic support problems are:  Cystocele: When the bladder drops down into your vagina.  Enterocele: When your small intestine drops between your vagina and rectum.  Rectocele: When your rectum bulges into the vaginal wall.  Uterine prolapse: When your uterus drops into your vagina.  Vaginal prolapse: When the top part of the vagina begins to droop. This sometimes happens after a hysterectomy (removal of the uterus).  Causes Pelvic support problems can be caused by many conditions. They may begin after you give birth, especially if you had a large baby. During childbirth, the muscles and skin of the birth canal (vagina) are stretched and sometimes torn. They heal over time but are not always exactly the same. A long pushing stage of labor may also weaken these tissues as well as very rapid births as the tissues do not have time to stretch so they tear.  Also, after menopause, there are changes in the vaginal walls resulting from a decrease in estrogen. Estrogen helps to keep the tissues toned. Low levels of estrogen weaken the vaginal walls and may cause the bladder to shift from its normal position. As women get older, the loss of muscle tone and the relaxation of muscles may cause the uterus or other organs to drop.  Over time, conditions like chronic coughing, chronic constipation, doing a lot of heavy lifting, straining to pass stool, and obesity, can also weaken the pelvic support muscles.   Diagnosing Pelvic Support Problems Your health care provider will ask about your symptoms and do a pelvic examination. Your provider may also do a rectal exam during your pelvic exam. Your provider may ask you to: 1. Bear down and push (like you are having a bowel movement) so he or she can see if your bladder or other part of your body protrudes into the vagina. 2. Contract the muscles of your pelvis to check the strength of your pelvic muscles.  3. Do several types of urine, nerve and muscle tests of the pelvis and around the bladder to see what type of treatment is best for you.   Symptoms Symptoms of pelvic support problems depend on the organ involved, but may include:  urine leakage  stain or fecal loss after a bowel movement trouble having bowel movements  ache in the lower abdomen, groin, or lower back  bladder infection  a feeling of heaviness, pulling, or fullness in the pelvis, or a feeling that something is falling out of the vagina  an organ protruding from your vaginal opening  feeling the need to support the organs or perineal area to empty bladder or bowels painful sexual intercourse.  Many women feel pelvic pressure or trouble holding their urine immediately after childbirth. For some, these symptoms go away permanently, in others they return as they get older.  Treatment Options A prolapsed organ cannot repair itself. Contact your health care provider as soon as you notice symptoms of a problem. Treatment depends on what the specific problem is and how far  advanced it is.  The symptoms caused by some pelvic support problems may simply be treated with changes in diet, medicine to soften the stool, weight loss, or avoiding strenuous activities. You may also do pelvic floor exercises to help strengthen your pelvic muscles.  Some cases of prolapse may require a special support device made from plastic or rubber called a pessary that fits into the vagina to support the uterus,  vagina, or bladder. A pessary can also help women who leak urine when coughing, straining, or exercising. In mild cases, a tampon or vaginal diaphragm may be used instead of a pessary.  Talk to your doctor or health care provider about these options. In serious cases, surgery may be needed to put the organs back into their proper place. The uterus may be removed because of the pressure it puts on the bladder.  Your doctor will know what surgery will be best for you. How can I prevent pelvic support problems?  You can help prevent pelvic support problems by:  maintaining a healthy lifestyle  continuing to do pelvic floor exercises after you deliver a baby  maintaining a healthy weight  avoiding a lot of heavy lifting and lifting with your legs (not from your waist)  treating constipation and avoid getting      The first picture shows that there is no effect on the pelvic floor with gravity eliminated. The next three show that with a wedge pillow or a few pillows from home under your pelvis the pelvic floor is inverted and may relax and allows gravity to help return prolapsed areas more inward to help relieve symptoms. Do this 15-20 mins every evening when symptoms tend to be worse. Stop if you have pain or negative symptoms.    Eulis Foster, PT [facility] [address] [place], [place] [address] W: [phone] [email or web address]

## 2023-12-06 NOTE — Therapy (Signed)
 OUTPATIENT PHYSICAL THERAPY FEMALE PELVIC TREATMENT   Patient Name: [name] MRN: [id number] DOB:1946-07-20, 78 y.o., female Today's Date: 12/06/2023  END OF SESSION:  PT End of Session - 12/06/23 1404     Visit Number 4    Date for PT Re-Evaluation 01/10/24    Authorization Type [employer] medicare    Authorization - Visit Number 4    Authorization - Number of Visits 10    PT Start Time 1400    PT Stop Time 1445    PT Time Calculation (min) 45 min    Activity Tolerance Patient tolerated treatment well    Behavior During Therapy [facility] for tasks assessed/performed             Past Medical History:  Diagnosis Date   Bilateral ovarian cysts 09/12/2014   bilateral simple ovarian cysts.  [place] due in January 2018.   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 x 20 yr.   Hyperlipidemia    Hypothyroid    IBS (irritable bowel syndrome)    Impingement syndrome of right shoulder 07/2013   Osteoporosis    Polymyalgia rheumatica (HCC)    5-28  states she has been on prednisone and now is trying to only be on plaquenil to manage the polymyalgia    PONV (postoperative nausea and vomiting)    Primary localized osteoarthritis of right knee    Rupture of right rotator cuff 07/2013   VIN I (vulvar intraepithelial neoplasia I) 2017, 2018   this is from a perineal/vulvar biopsy.   Vitamin B12 deficiency    Past Surgical History:  Procedure Laterality Date   ANTERIOR LAT LUMBAR FUSION  10/08/2008   L3-4   BACK SURGERY     BACK SURGERY  10/03/2014   --Dr. Trey Sailors at [facility]   CATARACT EXTRACTION, BILATERAL     03/2023, 04/2023   COLONOSCOPY W/ BIOPSIES  11/2009   polyps   COLONOSCOPY W/ BIOPSIES  11/2012   polyp, recheck in 3 years   CYSTO  07/05/2006   CYSTOCELE REPAIR  07/05/2006   ESOPHAGOGASTRODUODENOSCOPY ENDOSCOPY  05/25/2005   erythema and edema of gastric lumen    EYE SURGERY Right    muscle adjustment    FOOT SURGERY Right    GANGLION CYST EXCISION Right    wrist   INCONTINENCE SURGERY  07/05/2006   cystourethropexy   ORIF FOREARM FRACTURE Right 09/2012   SHOULDER ARTHROSCOPY WITH ROTATOR CUFF REPAIR AND SUBACROMIAL DECOMPRESSION Right 08/02/2013   Procedure: SHOULDER ARTHROSCOPY WITH ROTATOR CUFF REPAIR AND SUBACROMIAL DECOMPRESSION, EXTENSIVE DEBRIDEMENT (INCLUDING LABRUM), PARTIAL ACROMIPLASY WITH CORACOACROMIAL RELEASE, DISTAL CLAVICULECTOMY;  Surgeon: Loreta Ave, MD;  Location: [facility];  Service: Orthopedics;  Laterality: Right;   STRABISMUS SURGERY Right 01/26/2008   2 other surgeries previously   STRABISMUS SURGERY Right 06/03/2017   Procedure: REPAIR STRABISMUS RIGHT EYE;  Surgeon: Verne Carrow, MD;  Location: [facility];  Service: Ophthalmology;  Laterality: Right;   Thumb surgery  2/13 & ?2010   Bilateral - states a joint was replaced   TOTAL KNEE ARTHROPLASTY Right 02/12/2019   Procedure: TOTAL KNEE ARTHROPLASTY;  Surgeon: Salvatore Marvel, MD;  Location: [facility];  Service: Orthopedics;  Laterality: Right;   TOTAL KNEE REVISION Right 05/12/2022   Procedure: Right knee polyethylene versus total knee arthroplasty revision;  Surgeon: Ollen Gross, MD;  Location: [facility];  Service: Orthopedics;  Laterality: Right;   TOTAL VAGINAL HYSTERECTOMY  34 - or age 50   secondary AUB   TUBAL LIGATION     laparosopic BTL age 83   varicose veins Bilateral    Patient Active Problem List   Diagnosis Date Noted   Loud snoring 11/10/2023   Heart palpitations 11/10/2023   Diaphoresis 11/10/2023   Sleep related leg cramps 11/10/2023   Nonrheumatic aortic valve stenosis 11/10/2023   Fitting and adjustment of pessary 10/12/2023   Microscopic hematuria 10/12/2023   History of recurrent UTI (urinary tract infection) 09/01/2023   History of pelvic surgery 09/01/2023   Urinary incontinence, mixed 09/01/2023    Dysuria 09/01/2023   Nocturia 09/01/2023   Vaginal atrophy 09/01/2023   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   Precordial chest pain 12/31/2021   Primary localized osteoarthritis of right knee 10/19/2018   Right foot pain 10/19/2018   Incontinence of feces 09/21/2018   Vulvar lesion 07/20/2018   History of repair of right rotator cuff 03/03/2017   Primary osteoarthritis of both hands/ has had bilateral [facility] surgery  03/03/2017   History of hypertension 03/03/2017   History of hyperlipidemia 03/03/2017   DDD (degenerative disc disease), lumbar/ history of lumbar surgery  03/03/2017   Osteopenia of multiple sites 03/03/2017   Chronic left shoulder pain 01/20/2017   Primary osteoarthritis of both knees 01/20/2017   Trapezius muscle spasm 01/20/2017   Insomnia 01/20/2017   Fatigue 01/20/2017   Fibromyalgia 01/20/2017   High risk medications (not anticoagulants) long-term use 01/20/2017   PMR (polymyalgia rheumatica) (HCC) 01/20/2017   Left ovarian cyst 06/02/2015   Degenerative joint disease, shoulder, right 08/02/2013   Precordial pain 02/11/2012   HTN (hypertension) 02/11/2012   Hyperlipidemia 02/11/2012    PCP: Kaleen Mask, MD  REFERRING PROVIDER: Loleta Chance, MD   REFERRING DIAG:  [phone] (ICD-10-CM) - Urinary incontinence, mixed  R15.9 (ICD-10-CM) - Incontinence of feces, unspecified fecal incontinence type  Z98.890 (ICD-10-CM) - History of pelvic surgery  Z87.440 (ICD-10-CM) - History of recurrent UTI (urinary tract infection)  R35.1 (ICD-10-CM) - Nocturia    THERAPY DIAG:  Muscle weakness (generalized)  Other lack of coordination  Rationale for Evaluation and Treatment: Rehabilitation  ONSET DATE: 2023  SUBJECTIVE:                                                                                                                                                                                            SUBJECTIVE STATEMENT: I have found out 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 I may have my bladder tacked up the same time. No date for surgery.   Fluid intake: Yes: water, milk, coffee, pepsi zero    PAIN:  Are you having pain? Yes NPRS scale: 3/10 Pain location:  right low back  Pain type: aching Pain description: constant   Aggravating factors: standing,  Relieving factors: medication  PRECAUTIONS: None  RED FLAGS: None   WEIGHT BEARING RESTRICTIONS: No  FALLS:  Has patient fallen in last 6 months? No, door opened quickly and knocked her down and not due to balance.   LIVING ENVIRONMENT: Lives with: lives with their spouse   OCCUPATION: makes cakes, standing  PLOF: Independent  PATIENT GOALS: reduce leakage  PERTINENT HISTORY:  S/p possible [place] midurethral sling Dr. Sherron Monday 07/05/06 ; Fibromyalgia; HTN; Osteoporosis; IBS; Polymyalgia rheumatica; vulvar intraepithelial neoplasia; Total vaginal hysterectomy  1986; Anterior lat lumbar fusion 2010; fractured pelvis   BOWEL MOVEMENT: Pain with bowel movement: No Leakage: Yes: only when she eats a lot of chocolate Pads: Yes:   Fiber supplement: Yes:    URINATION: Pain with urination: No Fully empty bladder: No, PVR is 70 ml Stream: Weak Urgency: Yes:   Frequency: average Leakage: waiting too long, carrying something heavy, coughing, laughing.  Pads: 1-2 pads/day   INTERCOURSE: not active  PREGNANCY: Vaginal deliveries 3 Tearing Yes:       OBJECTIVE:  Note: Objective measures were completed at Evaluation unless otherwise noted.  DIAGNOSTIC FINDINGS:  Urodynamic Impression 10/05/23:  Uroflow Qmax of 17 mL/sec, voided 210 mL and residual of 300 mL.  1. Sensation was increased; capacity was normal 2. Stress Incontinence was demonstrated at ISD range pressures; MUCP 29 cmH2O 3. Detrusor Overactivity was not demonstrated.  Normal compliance 4. Emptying was dysfunctional with a elevated PVR ( ), a sustained detrusor contraction present,  abdominal straining present, dyssynergic urethral sphincter activity on EMG. Pdet at Qmax was 13 cm of water.  Qmax was 10 mL/sec.  Repeat TVUS 09/22/23 "Uterus:  absent.  Left ovary:  4.2 x 3.97 x 3.30 cm.  Simple ovarian cyst 3.39 cm, avascular.  Right ovary:  2.26 x 1.55 x 1.46 cm. 1.8 cm simple avascular cyst noted adjacent to the ovary.  No free fluid." Yearly pelvic US per Dr. Ricki Miller  PVR was 70mL   PATIENT SURVEYS:  UIQ-7 24 PFIQ-7 22  COGNITION: Overall cognitive status: Within functional limits for tasks assessed     SENSATION: Light touch: Appears intact Proprioception: Appears intact   POSTURE: rounded shoulders, forward head, decreased lumbar lordosis, and flexed trunk     LOWER EXTREMITY ROM:  Passive ROM Right eval Left eval  Hip abduction 20 5  Hip internal rotation 14 45  Hip external rotation 35 25   (Blank rows = not tested)  LOWER EXTREMITY MMT:  MMT Right eval Left eval  Hip extension 4/5 4/5  Hip abduction 3/5 3/5  Hip adduction 4/5 4/5   PALPATION:   General  tightness in the abdominal muscles and diaphragm, decreased movement of the lower rib cage                External Perineal Exam slight redness along the posterior fourchette                             Internal Pelvic Floor tenderness located on the left iliococcygeus, initially she would bulge down to  contract the pelvic floor and she bulged when laughing. Firmness along the perineal body  Patient confirms identification and approves PT to assess internal pelvic floor and treatment Yes  PELVIC MMT:   MMT eval 11/03/23 12/06/23  Vaginal 1/5 1/5 2/5 with lots of verbal cues  (Blank rows = not tested)        TONE: average  TODAY'S TREATMENT:     12/06/23 Manual: Internal pelvic floor techniques: No emotional/communication barriers or cognitive limitation.  Patient is motivated to learn. Patient understands and agrees with treatment goals and plan. PT explains patient will be examined in standing, sitting, and lying down to see how their muscles and joints work. When they are ready, they will be asked to remove their underwear so PT can examine their perineum. The patient is also given the option of providing their own chaperone as one is not provided in our facility. The patient also has the right and is explained the right to defer or refuse any part of the evaluation or treatment including the internal exam. With the patient's consent, PT will use one gloved finger to gently assess the muscles of the pelvic floor, seeing how well it contracts and relaxes and if there is muscle symmetry. After, the patient will get dressed and PT and patient will discuss exam findings and plan of care. PT and patient discuss plan of care, schedule, attendance policy and HEP activities.  Going internally through the vagina working on the introitus, obturator internist,  and levator ani tissue for a pelvic floor contraction Neuromuscular re-education: Pelvic floor contraction training: Therapist finger in the vaginal canal working on pelvic floor contraction and not bearing down and contracting her gluteals Sitting leaning forward contracting the anterior pelvic floor and feel it lift off the chair Sitting leaning back and contract the pelvic floor and feel it the lift Lean to the side to contract the pelvic floor Down training: Diaphragmatic breathing while therapist finger in the vaginal canal to feel the muscles relax Therapeutic activities: Functional strengthening activities: Educated patient on flexing at her hips and not waist when lifting items and keeping the distance between the rib cage and pubic bone. Also not holding her breath.  Educated patient on sitting with legs up to reduce the prolapse, educated her on strengthening the pelvic floor to support the  prolapse       11/03/23 Manual: Myofascial release: Facial release of the urogenital diaphragm on each side with right side tighter than left Fascial release of the perineal body externally Internal pelvic floor techniques: No emotional/communication barriers or cognitive limitation. Patient is motivated to learn. Patient understands and agrees with treatment goals and plan. PT explains patient will be examined in standing, sitting, and lying down to see how their muscles and joints work. When they are ready, they will be asked to remove their underwear so PT can examine their perineum. The patient is also given the option of providing their own chaperone as one is not provided in our facility. The patient also has the right and is explained the right to defer or refuse any part of the evaluation or treatment including the internal exam. With the patient's consent, PT will use one gloved finger to gently assess the muscles of the pelvic floor, seeing how well it contracts and relaxes and if there is muscle symmetry. After, the patient will get dressed and PT and patient will discuss exam findings and plan of care. PT and patient discuss plan of care, schedule,  attendance policy and HEP activities.  Going internally through the vagina working on the introitus and levator ani tissue for a pelvic floor contraction Going through the vaginal canal working on the posterior wall and the internal area of the perineal body Neuromuscular re-education: Pelvic floor contraction training: Therapist finger in the vaginal canal working on contract with a lift and not bearing down, giving her different verbal cues that work with her to contract and tactile cues to not bulge the abdomen and push the therapist finger out of the canal Sitting ball squeeze with pelvic floor contraction Sitting press ball into lap with abdominal and pelvic floor contraction Sitting with bilateral shoulder external rotation using red band  with pelvic floor contraction Self-care: Educated patient on vaginal moisturizers, how it helps with the health of the tissue and reduce the red irritation she has, educated her on how to apply to the vaginal area nightly     10/25/23 Manual: Soft tissue mobilization: Manual work to the diaphragm to work on lower rib cage mobility and movement of the diaphragm Educated patient on abdominal massage to reduce the tightness and so the muscles can contract Scar tissue mobilization: Manual mobilization of the lower abdominal scar to improve tissue mobility.  Myofascial release: Tissue rolling of the lower abdomen and lower rib cage to increase tissue mobility Neuromuscular re-education: Core retraining: Contraction of the transverse abdominus with breath to work on pelvic floor Marching with abdominal contraction and therapist giving tactile cue to contract instead of bulge the abdomen Bridge to work on hip extension and gluteal contraction Down training: Diaphragmatic breathing in supine to relax the pelvic floor Therapeutic activities: Functional strengthening activities: Educated patient on flexing at her hips and not waist when lifting items She is to face the item she is lifting and not twist.    PATIENT EDUCATION:  11/03/23 Education details: Access Code: [email or web address], educated patient on vaginal moisturizer for daily use and how to apply Person educated: Patient Education method: Explanation, Demonstration, Tactile cues, Verbal cues, and Handouts Education comprehension: verbalized understanding, returned demonstration, verbal cues required, tactile cues required, and needs further education  HOME EXERCISE PROGRAM: 11/03/23 Access Code: [email or web address] URL: [email or web address] Date: 11/03/2023 Prepared by: Eulis Foster  Exercises - Supine Pelvic Floor Contraction  - 3 x daily - 7 x weekly - 1 sets - 10 reps - Supine Diaphragmatic Breathing  - 1 x daily - 3 x weekly - 1  sets - 10 reps - Hooklying Transversus Abdominis Palpation  - 1 x daily - 3 x weekly - 1 sets - 10 reps - Supine March  - 1 x daily - 3 x weekly - 1 sets - 10 reps - Supine Bridge with Pelvic Floor Contraction  - 1 x daily - 3 x weekly - 1 sets - 10 reps - Seated Hip Adduction Isometrics with Ball  - 2 x daily - 7 x weekly - 1 sets - 10 reps - Seated Abdominal Press into [employer]  - 2 x daily - 3 x weekly - 1 sets - 10 reps - Seated Shoulder W External Rotation on Swiss Ball  - 1 x daily - 3 x weekly - 3 sets - 10 reps  ASSESSMENT:  CLINICAL IMPRESSION: Patient is a 78 y.o. female who was seen today for physical therapy  treatment for urinary and fecal incontinence.  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 She may have her bladder  tacked up with the procedure.  Her pelvic floor strength is 2/5 but she needs many tactile cues and verbal cues to contract. At times she will bear down instead of pelvic floor contraction. She can feel her pelvic floor contract in sitting better than laying down in supine. At this time she is not leaking urine.  Patient will benefit from skilled therapy to improve pelvic floor strength and coordination to reduce urinary leakage and prolapse management.   OBJECTIVE IMPAIRMENTS: decreased coordination, decreased endurance, decreased strength, and increased fascial restrictions.   ACTIVITY LIMITATIONS: lifting, bending, continence, and locomotion level  PARTICIPATION LIMITATIONS: shopping and community activity  PERSONAL FACTORS: Fitness and 3+ comorbidities: S/p possible [employer]c midurethral sling Dr. Sherron Monday 07/05/06 ; Fibromyalgia; HTN; Osteoporosis; IBS; Polymyalgia rheumatica; vulvar intraepithelial neoplasia; Total vaginal hysterectomy  1986; Anterior lat lumbar fusion 2010; fractured pelvis   are also affecting patient's functional outcome.   REHAB POTENTIAL: Excellent  CLINICAL DECISION  MAKING: Evolving/moderate complexity  EVALUATION COMPLEXITY: Moderate   GOALS: Goals reviewed with patient? Yes  SHORT TERM GOALS: Target date: 11/15/23  Patient independent with initial HEP for hip stretches and abdominal massage.  Baseline: Goal status: INITIAL  2.  Patient is able to demonstrate diaphragmatic breathing to expand the lower rib cage and relax the pelvic floor.  Baseline:  Goal status: Met 10/25/23  3.  Patient educated on proper lifting techniques to flex at her hips and not waist.  Baseline:  Goal status: Met 10/25/23   LONG TERM GOALS: Target date: 01/10/24  Patient independent with advanced HEP for core and pelvic floor strength.  Baseline:  Goal status: INITIAL  2.  Patient reports her urinary leakage with coughing and sneezing has decreased >/= 60% due to increased strength and coordination of the pelvic floor.  Baseline:  Goal status: INITIAL  3.  Patient understands pressure management to reduce strain on her pelvic floor and reduce urinary leakage.  Baseline:  Goal status: INITIAL  4.  Patient reports her urinary urgency has decreased >/= 60% due to using the urge to void behavioral technique.  Baseline:  Goal status: INITIAL    PLAN:  PT FREQUENCY: 1-2x/week  PT DURATION: 12 weeks  PLANNED INTERVENTIONS: 97110-Therapeutic exercises, 97530- Therapeutic activity, 97112- Neuromuscular re-education, 97535- Self Care, [id number]- Manual therapy, Patient/Family education, Joint mobilization, and Biofeedback  PLAN FOR NEXT SESSION:  hip stretches, abdominal engagement,     Eulis Foster, PT 12/06/23 2:56 PM

## 2023-12-07 ENCOUNTER — Encounter: Payer: Self-pay | Admitting: Rheumatology

## 2023-12-07 ENCOUNTER — Ambulatory Visit: Payer: Medicare Other | Attending: Rheumatology | Admitting: Rheumatology

## 2023-12-07 VITALS — BP 114/79 | HR 70 | Resp 13 | Ht 68.0 in | Wt 185.4 lb

## 2023-12-07 DIAGNOSIS — M7061 Trochanteric bursitis, right hip: Secondary | ICD-10-CM

## 2023-12-07 DIAGNOSIS — M25512 Pain in left shoulder: Secondary | ICD-10-CM | POA: Diagnosis not present

## 2023-12-07 DIAGNOSIS — M8589 Other specified disorders of bone density and structure, multiple sites: Secondary | ICD-10-CM

## 2023-12-07 DIAGNOSIS — M1712 Unilateral primary osteoarthritis, left knee: Secondary | ICD-10-CM

## 2023-12-07 DIAGNOSIS — M62838 Other muscle spasm: Secondary | ICD-10-CM

## 2023-12-07 DIAGNOSIS — Z8639 Personal history of other endocrine, nutritional and metabolic disease: Secondary | ICD-10-CM

## 2023-12-07 DIAGNOSIS — G8929 Other chronic pain: Secondary | ICD-10-CM | POA: Diagnosis not present

## 2023-12-07 DIAGNOSIS — Z96651 Presence of right artificial knee joint: Secondary | ICD-10-CM

## 2023-12-07 DIAGNOSIS — Z9889 Other specified postprocedural states: Secondary | ICD-10-CM

## 2023-12-07 DIAGNOSIS — N3010 Interstitial cystitis (chronic) without hematuria: Secondary | ICD-10-CM

## 2023-12-07 DIAGNOSIS — M797 Fibromyalgia: Secondary | ICD-10-CM

## 2023-12-07 DIAGNOSIS — G4709 Other insomnia: Secondary | ICD-10-CM

## 2023-12-07 DIAGNOSIS — M19011 Primary osteoarthritis, right shoulder: Secondary | ICD-10-CM

## 2023-12-07 DIAGNOSIS — M19041 Primary osteoarthritis, right hand: Secondary | ICD-10-CM

## 2023-12-07 DIAGNOSIS — R5383 Other fatigue: Secondary | ICD-10-CM

## 2023-12-07 DIAGNOSIS — Z8679 Personal history of other diseases of the circulatory system: Secondary | ICD-10-CM

## 2023-12-07 DIAGNOSIS — M353 Polymyalgia rheumatica: Secondary | ICD-10-CM | POA: Diagnosis not present

## 2023-12-07 DIAGNOSIS — M533 Sacrococcygeal disorders, not elsewhere classified: Secondary | ICD-10-CM

## 2023-12-07 DIAGNOSIS — M19042 Primary osteoarthritis, left hand: Secondary | ICD-10-CM

## 2023-12-07 DIAGNOSIS — M47816 Spondylosis without myelopathy or radiculopathy, lumbar region: Secondary | ICD-10-CM

## 2023-12-07 DIAGNOSIS — Z79899 Other long term (current) drug therapy: Secondary | ICD-10-CM

## 2023-12-07 MED ORDER — LIDOCAINE HCL 1 % IJ SOLN
1.5000 mL | INTRAMUSCULAR | Status: AC | PRN
  Administered 2023-12-07: 1.5 mL

## 2023-12-07 MED ORDER — TRIAMCINOLONE ACETONIDE 40 MG/ML IJ SUSP
40.0000 mg | INTRAMUSCULAR | Status: AC | PRN
  Administered 2023-12-07: 40 mg via INTRA_ARTICULAR

## 2023-12-07 NOTE — Patient Instructions (Addendum)
 Shoulder Exercises Ask your health care provider which exercises are safe for you. Do exercises exactly as told by your health care provider and adjust them as directed. It is normal to feel mild stretching, pulling, tightness, or discomfort as you do these exercises. Stop right away if you feel sudden pain or your pain gets worse. Do not begin these exercises until told by your health care provider. Stretching exercises External rotation and abduction This exercise is sometimes called corner stretch. The exercise rotates your arm outward (external rotation) and moves your arm out from your body (abduction). Stand in a doorway with one of your feet slightly in front of the other. This is called a staggered stance. If you cannot reach your forearms to the door frame, stand facing a corner of a room. Choose one of the following positions as told by your health care provider: Place your hands and forearms on the door frame above your head. Place your hands and forearms on the door frame at the height of your head. Place your hands on the door frame at the height of your elbows. Slowly move your weight onto your front foot until you feel a stretch across your chest and in the front of your shoulders. Keep your head and chest upright and keep your abdominal muscles tight. Hold for __________ seconds. To release the stretch, shift your weight to your back foot. Repeat __________ times. Complete this exercise __________ times a day. Extension, standing  Stand and hold a broomstick, a cane, or a similar object behind your back. Your hands should be a little wider than shoulder-width apart. Your palms should face away from your back. Keeping your elbows straight and your shoulder muscles relaxed, move the stick away from your body until you feel a stretch in your shoulders (extension). Avoid shrugging your shoulders while you move the stick. Keep your shoulder blades tucked down toward the middle of your  back. Hold for __________ seconds. Slowly return to the starting position. Repeat __________ times. Complete this exercise __________ times a day. Range-of-motion exercises Pendulum  Stand near a wall or a surface that you can hold onto for balance. Bend at the waist and let your left / right arm hang straight down. Use your other arm to support you. Keep your back straight and do not lock your knees. Relax your left / right arm and shoulder muscles, and move your hips and your trunk so your left / right arm swings freely. Your arm should swing because of the motion of your body, not because you are using your arm or shoulder muscles. Keep moving your hips and trunk so your arm swings in the following directions, as told by your health care provider: Side to side. Forward and backward. In clockwise and counterclockwise circles. Continue each motion for __________ seconds, or for as long as told by your health care provider. Slowly return to the starting position. Repeat __________ times. Complete this exercise __________ times a day. Shoulder flexion, standing  Stand and hold a broomstick, a cane, or a similar object. Place your hands a little more than shoulder-width apart on the object. Your left / right hand should be palm-up, and your other hand should be palm-down. Keep your elbow straight and your shoulder muscles relaxed. Push the stick up with your healthy arm to raise your left / right arm in front of your body, and then over your head until you feel a stretch in your shoulder (flexion). Avoid shrugging your shoulder  while you raise your arm. Keep your shoulder blade tucked down toward the middle of your back. Hold for __________ seconds. Slowly return to the starting position. Repeat __________ times. Complete this exercise __________ times a day. Shoulder abduction, standing  Stand and hold a broomstick, a cane, or a similar object. Place your hands a little more than  shoulder-width apart on the object. Your left / right hand should be palm-up, and your other hand should be palm-down. Keep your elbow straight and your shoulder muscles relaxed. Push the object across your body toward your left / right side. Raise your left / right arm to the side of your body (abduction) until you feel a stretch in your shoulder. Do not raise your arm above shoulder height unless your health care provider tells you to do that. If directed, raise your arm over your head. Avoid shrugging your shoulder while you raise your arm. Keep your shoulder blade tucked down toward the middle of your back. Hold for __________ seconds. Slowly return to the starting position. Repeat __________ times. Complete this exercise __________ times a day. Internal rotation  Place your left / right hand behind your back, palm-up. Use your other hand to dangle an exercise band, a broomstick, or a similar object over your shoulder. Grasp the band with your left / right hand so you are holding on to both ends. Gently pull up on the band until you feel a stretch in the front of your left / right shoulder. The movement of your arm toward the center of your body is called internal rotation. Avoid shrugging your shoulder while you raise your arm. Keep your shoulder blade tucked down toward the middle of your back. Hold for __________ seconds. Release the stretch by letting go of the band and lowering your hands. Repeat __________ times. Complete this exercise __________ times a day. Strengthening exercises External rotation  Sit in a stable chair without armrests. Secure an exercise band to a stable object at elbow height on your left / right side. Place a soft object, such as a folded towel or a small pillow, between your left / right upper arm and your body to move your elbow about 4 inches (10 cm) away from your side. Hold the end of the exercise band so it is tight and there is no slack. Keeping your  elbow pressed against the soft object, slowly move your forearm out, away from your abdomen (external rotation). Keep your body steady so only your forearm moves. Hold for __________ seconds. Slowly return to the starting position. Repeat __________ times. Complete this exercise __________ times a day. Shoulder abduction  Sit in a stable chair without armrests, or stand up. Hold a __________ lb / kg weight in your left / right hand, or hold an exercise band with both hands. Start with your arms straight down and your left / right palm facing in, toward your body. Slowly lift your left / right hand out to your side (abduction). Do not lift your hand above shoulder height unless your health care provider tells you that this is safe. Keep your arms straight. Avoid shrugging your shoulder while you do this movement. Keep your shoulder blade tucked down toward the middle of your back. Hold for __________ seconds. Slowly lower your arm, and return to the starting position. Repeat __________ times. Complete this exercise __________ times a day. Shoulder extension  Sit in a stable chair without armrests, or stand up. Secure an exercise band to a  stable object in front of you so it is at shoulder height. Hold one end of the exercise band in each hand. Straighten your elbows and lift your hands up to shoulder height. Squeeze your shoulder blades together as you pull your hands down to the sides of your thighs (extension). Stop when your hands are straight down by your sides. Do not let your hands go behind your body. Hold for __________ seconds. Slowly return to the starting position. Repeat __________ times. Complete this exercise __________ times a day. Shoulder row  Sit in a stable chair without armrests, or stand up. Secure an exercise band to a stable object in front of you so it is at chest height. Hold one end of the exercise band in each hand. Position your palms so that your thumbs are  facing the ceiling (neutral position). Bend each of your elbows to a 90-degree angle (right angle) and keep your upper arms at your sides. Step back or move the chair back until the band is tight and there is no slack. Slowly pull your elbows back behind you. Hold for __________ seconds. Slowly return to the starting position. Repeat __________ times. Complete this exercise __________ times a day. Shoulder press-ups  Sit in a stable chair that has armrests. Sit upright, with your feet flat on the floor. Put your hands on the armrests so your elbows are bent and your fingers are pointing forward. Your hands should be about even with the sides of your body. Push down on the armrests and use your arms to lift yourself off the chair. Straighten your elbows and lift yourself up as much as you comfortably can. Move your shoulder blades down, and avoid letting your shoulders move up toward your ears. Keep your feet on the ground. As you get stronger, your feet should support less of your body weight as you lift yourself up. Hold for __________ seconds. Slowly lower yourself back into the chair. Repeat __________ times. Complete this exercise __________ times a day. Wall push-ups  Stand so you are facing a stable wall. Your feet should be about one arm-length away from the wall. Lean forward and place your palms on the wall at shoulder height. Keep your feet flat on the floor as you bend your elbows and lean forward toward the wall. Hold for __________ seconds. Straighten your elbows to push yourself back to the starting position. Repeat __________ times. Complete this exercise __________ times a day. This information is not intended to replace advice given to you by your health care provider. Make sure you discuss any questions you have with your health care provider. Document Revised: 10/20/2021 Document Reviewed: 10/20/2021 [employer] Patient Education  2024 [employer]  Hand Exercises Hand  exercises can be helpful for almost anyone. They can strengthen your hands and improve flexibility and movement. The exercises can also increase blood flow to the hands. These results can make your work and daily tasks easier for you. Hand exercises can be especially helpful for people who have joint pain from arthritis or nerve damage from using their hands over and over. These exercises can also help people who injure a hand. Exercises Most of these hand exercises are gentle stretching and motion exercises. It is usually safe to do them often throughout the day. Warming up your hands before exercise may help reduce stiffness. You can do this with gentle massage or by placing your hands in warm water for 10-15 minutes. It is normal to feel some stretching,  pulling, tightness, or mild discomfort when you begin new exercises. In time, this will improve. Remember to always be careful and stop right away if you feel sudden, very bad pain or your pain gets worse. You want to get better and be safe. Ask your health care provider which exercises are safe for you. Do exercises exactly as told by your provider and adjust them as told. Do not begin these exercises until told by your provider. Knuckle bend or "claw" fist  Stand or sit with your arm, hand, and all five fingers pointed straight up. Make sure to keep your wrist straight. Gently bend your fingers down toward your palm until the tips of your fingers are touching your palm. Keep your big knuckle straight and only bend the small knuckles in your fingers. Hold this position for 10 seconds. Straighten your fingers back to your starting position. Repeat this exercise 5-10 times with each hand. Full finger fist  Stand or sit with your arm, hand, and all five fingers pointed straight up. Make sure to keep your wrist straight. Gently bend your fingers into your palm until the tips of your fingers are touching the middle of your palm. Hold this position  for 10 seconds. Extend your fingers back to your starting position, stretching every joint fully. Repeat this exercise 5-10 times with each hand. Straight fist  Stand or sit with your arm, hand, and all five fingers pointed straight up. Make sure to keep your wrist straight. Gently bend your fingers at the big knuckle, where your fingers meet your hand, and at the middle knuckle. Keep the knuckle at the tips of your fingers straight and try to touch the bottom of your palm. Hold this position for 10 seconds. Extend your fingers back to your starting position, stretching every joint fully. Repeat this exercise 5-10 times with each hand. Tabletop  Stand or sit with your arm, hand, and all five fingers pointed straight up. Make sure to keep your wrist straight. Gently bend your fingers at the big knuckle, where your fingers meet your hand, as far down as you can. Keep the small knuckles in your fingers straight. Think of forming a tabletop with your fingers. Hold this position for 10 seconds. Extend your fingers back to your starting position, stretching every joint fully. Repeat this exercise 5-10 times with each hand. Finger spread  Place your hand flat on a table with your palm facing down. Make sure your wrist stays straight. Spread your fingers and thumb apart from each other as far as you can until you feel a gentle stretch. Hold this position for 10 seconds. Bring your fingers and thumb tight together again. Hold this position for 10 seconds. Repeat this exercise 5-10 times with each hand. Making circles  Stand or sit with your arm, hand, and all five fingers pointed straight up. Make sure to keep your wrist straight. Make a circle by touching the tip of your thumb to the tip of your index finger. Hold for 10 seconds. Then open your hand wide. Repeat this motion with your thumb and each of your fingers. Repeat this exercise 5-10 times with each hand. Thumb motion  Sit with your  forearm resting on a table and your wrist straight. Your thumb should be facing up toward the ceiling. Keep your fingers relaxed as you move your thumb. Lift your thumb up as high as you can toward the ceiling. Hold for 10 seconds. Bend your thumb across your palm as far  as you can, reaching the tip of your thumb for the small finger (pinkie) side of your palm. Hold for 10 seconds. Repeat this exercise 5-10 times with each hand. Grip strengthening  Hold a stress ball or other soft ball in the middle of your hand. Slowly increase the pressure, squeezing the ball as much as you can without causing pain. Think of bringing the tips of your fingers into the middle of your palm. All of your finger joints should bend when doing this exercise. Hold your squeeze for 10 seconds, then relax. Repeat this exercise 5-10 times with each hand. Contact a health care provider if: Your hand pain or discomfort gets much worse when you do an exercise. Your hand pain or discomfort does not improve within 2 hours after you exercise. If you have either of these problems, stop doing these exercises right away. Do not do them again unless your provider says that you can. Get help right away if: You develop sudden, severe hand pain or swelling. If this happens, stop doing these exercises right away. Do not do them again unless your provider says that you can. This information is not intended to replace advice given to you by your health care provider. Make sure you discuss any questions you have with your health care provider. Document Revised: 09/14/2022 Document Reviewed: 09/14/2022 Elsevier Patient Education  2024 [employer]  Iliotibial Band Syndrome Rehab Ask your health care provider which exercises are safe for you. Do exercises exactly as told by your provider and adjust them as told. It's normal to feel mild stretching, pulling, tightness, or discomfort as you do these exercises. Stop right away if you feel  sudden pain or your pain gets a lot worse. Do not begin these exercises until told by your provider. Stretching and range-of-motion exercises These exercises warm up your muscles and joints. They also improve the movement and flexibility of your hip and pelvis. Quadriceps stretch, prone  Lie face down (prone) on a firm surface like a bed or padded floor. Bend your left / right knee. Reach back to hold your ankle or pant leg. If you can't reach your ankle or pant leg, use a belt looped around your foot and grab the belt instead. Gently pull your heel toward your butt. Your knee should not slide out to the side. You should feel a stretch in the front of your thigh and knee, also called the quadriceps. Hold this position for __________ seconds. Repeat __________ times. Complete this exercise __________ times a day. Iliotibial band stretch The iliotibial band is a strip of tissue that runs along the outside of your hip down to your knee. Lie on your side with your left / right leg on top. Bend both knees and grab your left / right ankle. Stretch out your bottom arm to help you balance. Slowly bring your top knee back so your thigh goes behind your back. Slowly lower your top leg toward the floor until you feel a gentle stretch on the outside of your left / right hip and thigh. If you don't feel a stretch and your knee won't go farther, place the heel of your other foot on top of your knee and pull your knee down toward the floor with your foot. Hold this position for __________ seconds. Repeat __________ times. Complete this exercise __________ times a day. Strengthening exercises These exercises build strength and endurance in your hip and pelvis. Endurance means your muscles can keep working even when they're  tired. Straight leg raises, side-lying This exercise strengthens the muscles that rotate the leg at the hip and move it away from your body. These muscles are called hip abductors. Lie on  your side with your left / right leg on top. Lie so your head, shoulder, hip, and knee line up. You can bend your bottom knee to help you balance. Roll your hips slightly forward so they're stacked directly over each other. Your left / right knee should face forward. Tense the muscles in your outer thigh and hip. Lift your top leg 4-6 inches (10-15 cm) off the ground. Hold this position for __________ seconds. Slowly lower your leg back down to the starting position. Let your muscles fully relax before doing this exercise again. Repeat __________ times. Complete this exercise __________ times a day. Leg raises, prone This exercise strengthens the muscles that move the hips backward. These muscles are called hip extensors. Lie face down (prone) on your bed or a firm surface. You can put a pillow under your hips for comfort and to support your lower back. Bend your left / right knee so your foot points straight up toward the ceiling. Keep the other leg straight and behind you. Squeeze your butt muscles. Lift your left / right thigh off the firm surface. Do not let your back arch. Tense your thigh muscle as hard as you can without having more knee pain. Hold this position for __________ seconds. Slowly lower your leg to the starting position. Allow your leg to relax all the way. Repeat __________ times. Complete this exercise __________ times a day. Hip hike  Stand sideways on a bottom step. Place your feet so that your left / right leg is on the step, and the other foot is hanging off the side. If you need support for balance, hold onto a railing or wall. Keep your knees straight and your abdomen square, meaning your hips are level. Then, lift your left / right hip up toward the ceiling. Slowly let your leg that's hanging off the step lower towards the floor. Your foot should get closer to the ground. Do not lean or bend your knees during this movement. Repeat __________ times. Complete this  exercise __________ times a day. This information is not intended to replace advice given to you by your health care provider. Make sure you discuss any questions you have with your health care provider. Document Revised: 11/12/2022 Document Reviewed: 11/12/2022 Elsevier Patient Education  2024 [employer].

## 2023-12-12 ENCOUNTER — Ambulatory Visit (INDEPENDENT_AMBULATORY_CARE_PROVIDER_SITE_OTHER): Admitting: Neurology

## 2023-12-12 DIAGNOSIS — G4762 Sleep related leg cramps: Secondary | ICD-10-CM

## 2023-12-12 DIAGNOSIS — R002 Palpitations: Secondary | ICD-10-CM

## 2023-12-12 DIAGNOSIS — I10 Essential (primary) hypertension: Secondary | ICD-10-CM

## 2023-12-12 DIAGNOSIS — R0683 Snoring: Secondary | ICD-10-CM | POA: Diagnosis not present

## 2023-12-12 DIAGNOSIS — R61 Generalized hyperhidrosis: Secondary | ICD-10-CM

## 2023-12-12 DIAGNOSIS — I350 Nonrheumatic aortic (valve) stenosis: Secondary | ICD-10-CM

## 2023-12-15 ENCOUNTER — Encounter: Payer: Self-pay | Attending: Obstetrics | Admitting: Physical Therapy

## 2023-12-15 ENCOUNTER — Encounter: Payer: Self-pay | Admitting: Physical Therapy

## 2023-12-15 ENCOUNTER — Encounter: Payer: Self-pay | Admitting: Obstetrics

## 2023-12-15 DIAGNOSIS — R278 Other lack of coordination: Secondary | ICD-10-CM | POA: Diagnosis present

## 2023-12-15 DIAGNOSIS — M6281 Muscle weakness (generalized): Secondary | ICD-10-CM | POA: Insufficient documentation

## 2023-12-15 NOTE — Patient Instructions (Signed)
 How To Poop Better:  What are Good Poops? There is no one exact normal, but they should be REGULAR.  This varies from person to person and ranges from up to 3x/day or as little as 3-4/week.  This should stay consistent for you.  They should be formed and ideally one solid mass that doesn't fall apart or dissolve in the water and is brown in color.  Lifestyle Tips:  Fiber: Eat 25-31 grams per day Do not hold it.  If you need to go, GO! Try to go every day around the same time Walk and move more Probiotics for more healthy gut bacteria Water and fluids: half of your healthy body weight in ounces  Toileting Tips:  Posture: knees above hips, back flat, look straight ahead, RELAX Relax all the muscles from your face down to your toes Breathe: slow deep breaths into your belly and pelvic floor is RELAXED Blow: Tighten belly and blow like blowing up a balloon, make "SH" sound, make a vowel sound with a deep voice Do NOT sit more than 10 minutes After you are finished, tighten the anal muscles to reset pelvic floor back to normal     Eulis Foster, PT [facility] [address], [address] [place], [place] [address] W: [phone] [email or web address]

## 2023-12-15 NOTE — Therapy (Signed)
 OUTPATIENT PHYSICAL THERAPY FEMALE PELVIC TREATMENT   Patient Name: [name] MRN: [id number] DOB:08-22-1946, 78 y.o., female Today's Date: 12/15/2023  END OF SESSION:  PT End of Session - 12/15/23 0939     Visit Number 5    Date for PT Re-Evaluation 01/10/24    Authorization Type [employer] medicare    Authorization - Visit Number 5    Authorization - Number of Visits 10    PT Start Time 0930    PT Stop Time 1020    PT Time Calculation (min) 50 min    Activity Tolerance Patient tolerated treatment well    Behavior During Therapy [facility] for tasks assessed/performed             Past Medical History:  Diagnosis Date   Bilateral ovarian cysts 09/12/2014   bilateral simple ovarian cysts.  [place] due in January 2018.   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 x 20 yr.   Hyperlipidemia    Hypothyroid    IBS (irritable bowel syndrome)    Impingement syndrome of right shoulder 07/2013   Osteoporosis    Polymyalgia rheumatica (HCC)    5-28  states she has been on prednisone and now is trying to only be on plaquenil to manage the polymyalgia    PONV (postoperative nausea and vomiting)    Primary localized osteoarthritis of right knee    Rupture of right rotator cuff 07/2013   VIN I (vulvar intraepithelial neoplasia I) 2017, 2018   this is from a perineal/vulvar biopsy.   Vitamin B12 deficiency    Past Surgical History:  Procedure Laterality Date   ANTERIOR LAT LUMBAR FUSION  10/08/2008   L3-4   BACK SURGERY     BACK SURGERY  10/03/2014   --Dr. Trey Sailors at [facility]   CATARACT EXTRACTION, BILATERAL     03/2023, 04/2023   COLONOSCOPY W/ BIOPSIES  11/2009   polyps   COLONOSCOPY W/ BIOPSIES  11/2012   polyp, recheck in 3 years   CYSTO  07/05/2006   CYSTOCELE REPAIR  07/05/2006   ESOPHAGOGASTRODUODENOSCOPY ENDOSCOPY  05/25/2005   erythema and edema of gastric lumen    EYE SURGERY Right    muscle adjustment    FOOT SURGERY Right    GANGLION CYST EXCISION Right    wrist   INCONTINENCE SURGERY  07/05/2006   cystourethropexy   ORIF FOREARM FRACTURE Right 09/2012   SHOULDER ARTHROSCOPY WITH ROTATOR CUFF REPAIR AND SUBACROMIAL DECOMPRESSION Right 08/02/2013   Procedure: SHOULDER ARTHROSCOPY WITH ROTATOR CUFF REPAIR AND SUBACROMIAL DECOMPRESSION, EXTENSIVE DEBRIDEMENT (INCLUDING LABRUM), PARTIAL ACROMIPLASY WITH CORACOACROMIAL RELEASE, DISTAL CLAVICULECTOMY;  Surgeon: Loreta Ave, MD;  Location: [facility];  Service: Orthopedics;  Laterality: Right;   STRABISMUS SURGERY Right 01/26/2008   2 other surgeries previously   STRABISMUS SURGERY Right 06/03/2017   Procedure: REPAIR STRABISMUS RIGHT EYE;  Surgeon: Verne Carrow, MD;  Location: [facility];  Service: Ophthalmology;  Laterality: Right;   Thumb surgery  2/13 & ?2010   Bilateral - states a joint was replaced   TOTAL KNEE ARTHROPLASTY Right 02/12/2019   Procedure: TOTAL KNEE ARTHROPLASTY;  Surgeon: Salvatore Marvel, MD;  Location: [facility];  Service: Orthopedics;  Laterality: Right;   TOTAL KNEE REVISION Right 05/12/2022   Procedure: Right knee polyethylene versus total knee arthroplasty revision;  Surgeon: Ollen Gross, MD;  Location: [facility];  Service: Orthopedics;  Laterality: Right;   TOTAL VAGINAL HYSTERECTOMY  37 - or age 72   secondary AUB   TUBAL LIGATION     laparosopic BTL age 73   varicose veins Bilateral    Patient Active Problem List   Diagnosis Date Noted   Loud snoring 11/10/2023   Heart palpitations 11/10/2023   Diaphoresis 11/10/2023   Sleep related leg cramps 11/10/2023   Nonrheumatic aortic valve stenosis 11/10/2023   Fitting and adjustment of pessary 10/12/2023   Microscopic hematuria 10/12/2023   History of recurrent UTI (urinary tract infection) 09/01/2023   History of pelvic surgery 09/01/2023   Urinary incontinence, mixed 09/01/2023    Dysuria 09/01/2023   Nocturia 09/01/2023   Vaginal atrophy 09/01/2023   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   Precordial chest pain 12/31/2021   Primary localized osteoarthritis of right knee 10/19/2018   Right foot pain 10/19/2018   Incontinence of feces 09/21/2018   Vulvar lesion 07/20/2018   History of repair of right rotator cuff 03/03/2017   Primary osteoarthritis of both hands/ has had bilateral [facility] surgery  03/03/2017   History of hypertension 03/03/2017   History of hyperlipidemia 03/03/2017   DDD (degenerative disc disease), lumbar/ history of lumbar surgery  03/03/2017   Osteopenia of multiple sites 03/03/2017   Chronic left shoulder pain 01/20/2017   Primary osteoarthritis of both knees 01/20/2017   Trapezius muscle spasm 01/20/2017   Insomnia 01/20/2017   Fatigue 01/20/2017   Fibromyalgia 01/20/2017   High risk medications (not anticoagulants) long-term use 01/20/2017   PMR (polymyalgia rheumatica) (HCC) 01/20/2017   Left ovarian cyst 06/02/2015   Degenerative joint disease, shoulder, right 08/02/2013   Precordial pain 02/11/2012   HTN (hypertension) 02/11/2012   Hyperlipidemia 02/11/2012    PCP: Kaleen Mask, MD  REFERRING PROVIDER: Loleta Chance, MD   REFERRING DIAG:  [phone] (ICD-10-CM) - Urinary incontinence, mixed  R15.9 (ICD-10-CM) - Incontinence of feces, unspecified fecal incontinence type  Z98.890 (ICD-10-CM) - History of pelvic surgery  Z87.440 (ICD-10-CM) - History of recurrent UTI (urinary tract infection)  R35.1 (ICD-10-CM) - Nocturia    THERAPY DIAG:  Muscle weakness (generalized)  Other lack of coordination  Rationale for Evaluation and Treatment: Rehabilitation  ONSET DATE: 2023  SUBJECTIVE:                                                                                                                                                                                            SUBJECTIVE STATEMENT: I am still waiting for them to schedule the surgery.  Fluid intake:  Yes: water, milk, coffee, pepsi zero    PAIN:  Are you having pain? Yes NPRS scale: 3/10 Pain location:  right low back  Pain type: aching Pain description: constant   Aggravating factors: standing,  Relieving factors: medication  PRECAUTIONS: None  RED FLAGS: None   WEIGHT BEARING RESTRICTIONS: No  FALLS:  Has patient fallen in last 6 months? No, door opened quickly and knocked her down and not due to balance.   LIVING ENVIRONMENT: Lives with: lives with their spouse   OCCUPATION: makes cakes, standing  PLOF: Independent  PATIENT GOALS: reduce leakage  PERTINENT HISTORY:  S/p possible [employer] midurethral sling Dr. Sherron Monday 07/05/06 ; Fibromyalgia; HTN; Osteoporosis; IBS; Polymyalgia rheumatica; vulvar intraepithelial neoplasia; Total vaginal hysterectomy  1986; Anterior lat lumbar fusion 2010; fractured pelvis   BOWEL MOVEMENT: Pain with bowel movement: No Leakage: Yes: only when she eats a lot of chocolate Pads: Yes:   Fiber supplement: Yes:    URINATION: Pain with urination: No Fully empty bladder: No, PVR is 70 ml Stream: Weak Urgency: Yes:   Frequency: average Leakage: waiting too long, carrying something heavy, coughing, laughing. 12/15/23: urinary leakage with waiting too long to go to the bathroom, heavy lifting, not laughing  Pads: 1-2 pads/day   INTERCOURSE: not active  PREGNANCY: Vaginal deliveries 3 Tearing Yes:       OBJECTIVE:  Note: Objective measures were completed at Evaluation unless otherwise noted.  DIAGNOSTIC FINDINGS:  Urodynamic Impression 10/05/23:  Uroflow Qmax of 17 mL/sec, voided 210 mL and residual of 300 mL.  1. Sensation was increased; capacity was normal 2. Stress Incontinence was demonstrated at ISD range pressures; MUCP 29 cmH2O 3. Detrusor Overactivity was not demonstrated. Normal compliance 4. Emptying was  dysfunctional with a elevated PVR ( ), a sustained detrusor contraction present,  abdominal straining present, dyssynergic urethral sphincter activity on EMG. Pdet at Qmax was 13 cm of water.  Qmax was 10 mL/sec.  Repeat TVUS 09/22/23 "Uterus:  absent.  Left ovary:  4.2 x 3.97 x 3.30 cm.  Simple ovarian cyst 3.39 cm, avascular.  Right ovary:  2.26 x 1.55 x 1.46 cm. 1.8 cm simple avascular cyst noted adjacent to the ovary.  No free fluid." Yearly pelvic US per Dr. Ricki Miller  PVR was 70mL   PATIENT SURVEYS:  UIQ-7 24 PFIQ-7 22  COGNITION: Overall cognitive status: Within functional limits for tasks assessed     SENSATION: Light touch: Appears intact Proprioception: Appears intact   POSTURE: rounded shoulders, forward head, decreased lumbar lordosis, and flexed trunk     LOWER EXTREMITY ROM:  Passive ROM Right eval Left eval  Hip abduction 20 5  Hip internal rotation 14 45  Hip external rotation 35 25   (Blank rows = not tested)  LOWER EXTREMITY MMT:  MMT Right eval Left eval  Hip extension 4/5 4/5  Hip abduction 3/5 3/5  Hip adduction 4/5 4/5   PALPATION:   General  tightness in the abdominal muscles and diaphragm, decreased movement of the lower rib cage                External Perineal Exam slight redness along the posterior fourchette                             Internal Pelvic Floor tenderness located on the left iliococcygeus, initially she would bulge down to contract the pelvic floor and she bulged when laughing. Firmness along the perineal body  Patient confirms identification and approves PT to assess internal pelvic floor and treatment Yes  PELVIC MMT:   MMT eval 11/03/23 12/06/23  Vaginal 1/5 1/5 2/5 with lots of verbal cues  (Blank rows = not tested)        TONE: average  TODAY'S TREATMENT:     12/15/23 Manual: Soft tissue mobilization: Scar tissue mobilization: Myofascial release: Spinal mobilization: Internal pelvic floor techniques: Dry  needling: Neuromuscular re-education: Core retraining: Core facilitation: Form correction: Pelvic floor contraction training:( all exercises engage the pelvic floor and core) Seated squeeze ball and contract the pelvic floor feeling it lift off the chair Seated pressing into small ball to engage the pelvic floor and abdominals Squeeze ball with shoulder external rotation in sitting  Supine transverse abdominus contraction with pelvic floor contraction  Supine marching with abdominal and pelvic floor contraction Down training: Diaphragmatic breathing in sitting with therapist giving tactile cues to not lift the chest.  Exercises: Stretches/mobility: Strengthening: Therapeutic activities: Functional strengthening activities: Educated patient on correct ways to have a bowel movement so she does not strain after surgery Educated patient on lifting with behind her hips and knees, breath out while lifting, keeping the distance between the rib cage and pubic bone.  Self-care:     12/06/23 Manual: Internal pelvic floor techniques: No emotional/communication barriers or cognitive limitation. Patient is motivated to learn. Patient understands and agrees with treatment goals and plan. PT explains patient will be examined in standing, sitting, and lying down to see how their muscles and joints work. When they are ready, they will be asked to remove their underwear so PT can examine their perineum. The patient is also given the option of providing their own chaperone as one is not provided in our facility. The patient also has the right and is explained the right to defer or refuse any part of the evaluation or treatment including the internal exam. With the patient's consent, PT will use one gloved finger to gently assess the muscles of the pelvic floor, seeing how well it contracts and relaxes and if there is muscle symmetry. After, the patient will get dressed and PT and patient will discuss exam  findings and plan of care. PT and patient discuss plan of care, schedule, attendance policy and HEP activities.  Going internally through the vagina working on the introitus, obturator internist,  and levator ani tissue for a pelvic floor contraction Neuromuscular re-education: Pelvic floor contraction training: Therapist finger in the vaginal canal working on pelvic floor contraction and not bearing down and contracting her gluteals Sitting leaning forward contracting the anterior pelvic floor and feel it lift off the chair Sitting leaning back and contract the pelvic floor and feel it the lift Lean to the side to contract the pelvic floor Down training: Diaphragmatic breathing while therapist finger in the vaginal canal to feel the muscles relax Therapeutic activities: Functional strengthening activities: Educated patient on flexing at her hips and not waist when lifting items and keeping the distance between the rib cage and pubic bone. Also not holding her breath.  Educated patient on sitting with legs up to reduce the prolapse, educated her on strengthening the pelvic floor to support the prolapse       11/03/23 Manual: Myofascial release: Facial release of the urogenital diaphragm on each side with right side tighter than left Fascial release of the perineal body externally Internal pelvic floor techniques: No emotional/communication barriers or cognitive limitation. Patient is motivated to learn. Patient understands and agrees  with treatment goals and plan. PT explains patient will be examined in standing, sitting, and lying down to see how their muscles and joints work. When they are ready, they will be asked to remove their underwear so PT can examine their perineum. The patient is also given the option of providing their own chaperone as one is not provided in our facility. The patient also has the right and is explained the right to defer or refuse any part of the evaluation or  treatment including the internal exam. With the patient's consent, PT will use one gloved finger to gently assess the muscles of the pelvic floor, seeing how well it contracts and relaxes and if there is muscle symmetry. After, the patient will get dressed and PT and patient will discuss exam findings and plan of care. PT and patient discuss plan of care, schedule, attendance policy and HEP activities.  Going internally through the vagina working on the introitus and levator ani tissue for a pelvic floor contraction Going through the vaginal canal working on the posterior wall and the internal area of the perineal body Neuromuscular re-education: Pelvic floor contraction training: Therapist finger in the vaginal canal working on contract with a lift and not bearing down, giving her different verbal cues that work with her to contract and tactile cues to not bulge the abdomen and push the therapist finger out of the canal Sitting ball squeeze with pelvic floor contraction Sitting press ball into lap with abdominal and pelvic floor contraction Sitting with bilateral shoulder external rotation using red band with pelvic floor contraction Self-care: Educated patient on vaginal moisturizers, how it helps with the health of the tissue and reduce the red irritation she has, educated her on how to apply to the vaginal area nightly       PATIENT EDUCATION:  11/03/23 Education details: Access Code: [email or web address], educated patient on vaginal moisturizer for daily use and how to apply Person educated: Patient Education method: Programmer, multimedia, Demonstration, Tactile cues, Verbal cues, and Handouts Education comprehension: verbalized understanding, returned demonstration, verbal cues required, tactile cues required, and needs further education  HOME EXERCISE PROGRAM: 11/03/23 Access Code: [email or web address] URL: [email or web address] Date: 11/03/2023 Prepared by: Eulis Foster  Exercises - Supine Pelvic  Floor Contraction  - 3 x daily - 7 x weekly - 1 sets - 10 reps - Supine Diaphragmatic Breathing  - 1 x daily - 3 x weekly - 1 sets - 10 reps - Hooklying Transversus Abdominis Palpation  - 1 x daily - 3 x weekly - 1 sets - 10 reps - Supine March  - 1 x daily - 3 x weekly - 1 sets - 10 reps - Supine Bridge with Pelvic Floor Contraction  - 1 x daily - 3 x weekly - 1 sets - 10 reps - Seated Hip Adduction Isometrics with Ball  - 2 x daily - 7 x weekly - 1 sets - 10 reps - Seated Abdominal Press into [employer]  - 2 x daily - 3 x weekly - 1 sets - 10 reps - Seated Shoulder W External Rotation on Swiss Ball  - 1 x daily - 3 x weekly - 3 sets - 10 reps  ASSESSMENT:  CLINICAL IMPRESSION: Patient is a 78 y.o. female who was seen today for physical therapy  treatment for urinary and fecal incontinence. Patient has urinary leakage with waiting too long to go to the bathroom, heavy lifting, not laughing. Urinary leakage is 40% better.   Patient will wake up 1  time per night.  Urinary urgency is 40 % better. She is able to wait longer to urinate.  Patient therapy is now getting her ready for surgery. She feels better about things. Patient will benefit from skilled therapy to improve pelvic floor strength and coordination to reduce urinary leakage and prolapse management.   OBJECTIVE IMPAIRMENTS: decreased coordination, decreased endurance, decreased strength, and increased fascial restrictions.   ACTIVITY LIMITATIONS: lifting, bending, continence, and locomotion level  PARTICIPATION LIMITATIONS: shopping and community activity  PERSONAL FACTORS: Fitness and 3+ comorbidities: S/p possible [employer] midurethral sling Dr. Sherron Monday 07/05/06 ; Fibromyalgia; HTN; Osteoporosis; IBS; Polymyalgia rheumatica; vulvar intraepithelial neoplasia; Total vaginal hysterectomy  1986; Anterior lat lumbar fusion 2010; fractured pelvis   are also affecting patient's functional outcome.   REHAB POTENTIAL: Excellent  CLINICAL  DECISION MAKING: Evolving/moderate complexity  EVALUATION COMPLEXITY: Moderate   GOALS: Goals reviewed with patient? Yes  SHORT TERM GOALS: Target date: 11/15/23  Patient independent with initial HEP for hip stretches and abdominal massage.  Baseline: Goal status: Met 12/15/23  2.  Patient is able to demonstrate diaphragmatic breathing to expand the lower rib cage and relax the pelvic floor.  Baseline:  Goal status: Met 10/25/23  3.  Patient educated on proper lifting techniques to flex at her hips and not waist.  Baseline:  Goal status: Met 10/25/23   LONG TERM GOALS: Target date: 01/10/24  Patient independent with advanced HEP for core and pelvic floor strength.  Baseline:  Goal status: ongoing 12/15/23  2.  Patient reports her urinary leakage with coughing and sneezing has decreased >/= 60% due to increased strength and coordination of the pelvic floor.  Baseline: 40% better Goal status: ongoing 12/15/23  3.  Patient understands pressure management to reduce strain on her pelvic floor and reduce urinary leakage.  Baseline:  Goal status: ongoing 12/15/23  4.  Patient reports her urinary urgency has decreased >/= 60% due to using the urge to void behavioral technique.  Baseline: Urinary urgency is 40 % better.  Goal status: Ongoing 12/15/23    PLAN:  PT FREQUENCY: 1-2x/week  PT DURATION: 12 weeks  PLANNED INTERVENTIONS: 97110-Therapeutic exercises, 97530- Therapeutic activity, 97112- Neuromuscular re-education, 97535- Self Care, 16109- Manual therapy, Patient/Family education, Joint mobilization, and Biofeedback  PLAN FOR NEXT SESSION:  hip stretches, abdominal engagement,  prepare for surgery   Eulis Foster, PT 12/15/23 10:26 AM

## 2023-12-18 NOTE — Progress Notes (Unsigned)
 Cardiology Office Note:   Date:  12/19/2023  ID:  [name], DOB [birth year], MRN [id number] PCP: Kaleen Mask, MD  [facility] Providers Cardiologist:  Rollene Rotunda, MD {  History of Present Illness:   [name] is a 78 y.o. female who presented for evaluation of chest pain.  I saw her for this couple of years ago.  She had no calcium and normal coronaries on a CT.  The etiology of this was not clear.  She did not present to her primary for further workup and says she still gets this discomfort and its unchanged in frequency or intensity.  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  She does continue to have fatigue.  She is actually just had a sleep study but results are pending.  She has not had any new shortness of breath, PND or orthopnea.  She has not had any new palpitations, presyncope or syncope.  She is preop for bladder surgery.   ROS: As stated in the HPI and negative for all other systems.  Studies Reviewed:    EKG:   EKG Interpretation Date/Time:  Monday December 19 2023 09:50:31 EDT Ventricular Rate:  76 PR Interval:  134 QRS Duration:  96 QT Interval:  378 QTC Calculation: 425 R Axis:   83  Text Interpretation: Normal sinus rhythm with sinus arrhythmia Normal ECG When compared with ECG of 08-Feb-2019 09:19, No significant change was found Confirmed by Rollene Rotunda ([id number]) on 12/19/2023 10:19:05 AM     Risk Assessment/Calculations:              Physical Exam:   VS:  BP 118/80 (BP Location: Left Arm, Patient Position: Sitting, Cuff Size: Normal)   Pulse 75   Ht 5\' 8"  (1.727 m)   Wt 182 lb 6.4 oz (82.7 kg)   LMP 09/13/1984 (Approximate)   SpO2 97%   BMI 27.73 kg/m    Wt Readings from Last 3 Encounters:  12/19/23 182 lb 6.4 oz (82.7 kg)  12/07/23 185 lb 6.4 oz (84.1 kg)  11/10/23 184 lb (83.5 kg)     GEN: Well nourished, well developed in no acute distress NECK: No JVD; No carotid bruits CARDIAC:  RRR, 2 out of 6 apical systolic murmur radiating slightly at the aortic outflow tract and early peaking, no diastolic murmurs, rubs, gallops RESPIRATORY:  Clear to auscultation without rales, wheezing or rhonchi  ABDOMEN: Soft, non-tender, non-distended EXTREMITIES:  No edema; No deformity   ASSESSMENT AND PLAN:   PRECORDIAL CHEST PAIN: She does continue to have this but it is unchanged from previous when she had normal coronaries.  I would suggest a nonanginal source and no further cardiac testing would be indicated.  Of asked her to follow-up with her primary to discuss this.   PREOP: The patient would be at acceptable risk for the planned bladder surgery according to ACC/AHA guidelines.  She has a high functional level, no high risk findings and is not going for high risk surgery.  No further cardiovascular testing is suggested.    HTN: Her blood pressure is controlled.  No change in therapy.   AS: This was mild on echo in May 2023.  I will follow this with echo in 2 years and see her in exam afterwards.    FATIGUE: I have asked her to follow-up with a primary provider.  She has had a little bit of anemia in the past.  Thyroid is managed.  She does have a sleep study pending.  I do not see a cardiac etiology.     Follow up with me in two years.   Signed, Rollene Rotunda, MD

## 2023-12-19 ENCOUNTER — Encounter: Payer: Self-pay | Admitting: Cardiology

## 2023-12-19 ENCOUNTER — Ambulatory Visit: Payer: Medicare Other | Attending: Cardiology | Admitting: Cardiology

## 2023-12-19 VITALS — BP 118/80 | HR 75 | Ht 68.0 in | Wt 182.4 lb

## 2023-12-19 DIAGNOSIS — I10 Essential (primary) hypertension: Secondary | ICD-10-CM | POA: Diagnosis not present

## 2023-12-19 DIAGNOSIS — I350 Nonrheumatic aortic (valve) stenosis: Secondary | ICD-10-CM | POA: Diagnosis not present

## 2023-12-19 DIAGNOSIS — R072 Precordial pain: Secondary | ICD-10-CM | POA: Diagnosis not present

## 2023-12-19 DIAGNOSIS — R5383 Other fatigue: Secondary | ICD-10-CM

## 2023-12-19 NOTE — Patient Instructions (Signed)
 Medication Instructions:  NO CHANGES  *If you need a refill on your cardiac medications before your next appointment, please call your pharmacy*  Testing/Procedures: Your physician has requested that you have an echocardiogram. Echocardiography is a painless test that uses sound waves to create images of your heart. It provides your doctor with information about the size and shape of your heart and how well your heart's chambers and valves are working. This procedure takes approximately one hour. There are no restrictions for this procedure. Please do NOT wear cologne, perfume, aftershave, or lotions (deodorant is allowed). Please arrive 15 minutes prior to your appointment time.  Please note: We ask at that you not bring children with you during ultrasound (echo/ vascular) testing. Due to room size and safety concerns, children are not allowed in the ultrasound rooms during exams. Our front office staff cannot provide observation of children in our lobby area while testing is being conducted. An adult accompanying a patient to their appointment will only be allowed in the ultrasound room at the discretion of the ultrasound technician under special circumstances. We apologize for any inconvenience. DUE 12/2025  Follow-Up: At [facility], you and your health needs are our priority.  As part of our continuing mission to provide you with exceptional heart care, our providers are all part of one team.  This team includes your primary Cardiologist (physician) and Advanced Practice Providers or APPs (Physician Assistants and Nurse Practitioners) who all work together to provide you with the care you need, when you need it.  Your next appointment:   2 years with Dr. Antoine Poche  We recommend signing up for the patient portal called "MyChart".  Sign up information is provided on this After Visit Summary.  MyChart is used to connect with patients for Virtual Visits (Telemedicine).  Patients are able  to view lab/test results, encounter notes, upcoming appointments, etc.  Non-urgent messages can be sent to your provider as well.   To learn more about what you can do with MyChart, go to [email or web address].        1st Floor: - Lobby - Registration  - Pharmacy  - Lab - Cafe  2nd Floor: - PV Lab - Diagnostic Testing (echo, CT, nuclear med)  3rd Floor: - Vacant  4th Floor: - TCTS (cardiothoracic surgery) - AFib Clinic - Structural Heart Clinic - Vascular Surgery  - Vascular Ultrasound  5th Floor: - [facility] (general and EP) - Clinical Pharmacy for coumadin, hypertension, lipid, weight-loss medications, and med management appointments    Valet parking services will be available as well.

## 2023-12-20 ENCOUNTER — Encounter: Payer: Self-pay | Admitting: Physical Therapy

## 2024-01-05 ENCOUNTER — Encounter: Payer: Self-pay | Admitting: Neurology

## 2024-01-05 NOTE — Procedures (Signed)
 [facility] POLYSOMNOGRAPHY  INTERPRETATION REPORT   STUDY DATE:  12/12/2023     PATIENT NAME:  [name]         DATE OF BIRTH:  31-Oct-1945  PATIENT ID:  [id number]    TYPE OF STUDY:  PSG  READING PHYSICIAN: Neomia Banner, MD REFERRED BY: Dr Aron Lard  SCORING TECHNICIAN: Octavia Belton, RPSGT   HISTORY:  This 78 year-old Female patient was referred by Dr Aron Lard and seen on 11-10-2023. Chronic Insomnia, difficulties to go to sleep and to stay asleep. 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  " I snore, I have palpitations, I perspire at night ". 78 y.o. year old female here with: 1) snoring, possible apnea and nocturnal palpitations, diaphoresis.  Waking up after 2 hours of sleep unclear why. 2)  chronic sleep initiation insomnia, "mind is busy ". failed melatonin, trazodone, Elavil, and now takes Tylenol  PM. 3) lifelong short sleeper, 4-5 hours max sleep time- even as a teenager and as a young mother - and she was not sleep restricted.  I prefer an in lab sleep study due to recently increased blood pressure, more frequent nocturnal diaphoresis and her dx of heart disease to look at cardiac rhythm and at the cause of arousals.  More sleepiness than fatigue was reported. The Epworth Sleepiness Scale endorsed at 11 /24 points (scores above or equal to 10 are suggestive of hypersomnolence). FSS endorsed at 16 /63 points.  Height: 68 in Weight: 184 lb (BMI 27) Neck Size: 14 in  MEDICATIONS: Tylenol , Lotensin, Calcium + D, Keflex , Vitamin B12, Boniva, Synthroid , Estrace , Magnesium, Metamucil, Vigomaox, Multivitamin, Preservision eds, Aleve, Deltasone , Trimpex  TECHNICAL DESCRIPTION: A registered sleep technologist (RPSGT) was in attendance for the duration of the recording.  Data collection, scoring, video monitoring, and reporting were performed in compliance with the  AASM Manual for the Scoring of Sleep and Associated Events.   SLEEP CONTINUITY AND SLEEP ARCHITECTURE:  Lights-out was at 22:00: and lights-on at  04:55:, with  6.9 minutes of recording time . Total sleep time (TST) was 249.5 minutes with a decreased sleep efficiency at 60.0%.  Sleep latency was increased at 73.0 minutes.  REM sleep latency was 55.5 minutes. Of the total sleep time, the percentage of stage N1 sleep was 12.2%, stage N2 sleep was 78%, stage N3 sleep was 0.0%, and REM sleep was 9.4%.  There were 1 Stage R periods observed on this study night, 14 awakenings (i.e. transitions to Stage W from any sleep stage), and 47 total stage transitions. Wake after sleep onset (WASO) time accounted for 92 minutes (!).  BODY POSITION:  TST was divided  between the following sleep positions: supine 25 minutes (10%), non-supine sleep for 224 minutes (90%); divided into right sided sleep for 126 minutes (51%), left sided 98 minutes (39%), and prone sleep 00 minutes (0%).   RESPIRATORY MONITORING:   Based on CMS criteria (using a 4% oxygen  desaturation rule for scoring hypopneas), there were 3 apneas (0 obstructive; 1 central; 2 mixed), and 0 hypopneas.   Apnea index was 0.7/h. Hypopnea index was 0.0/h.  The AHI apnea-hypopnea index was 0.7 overall (0.0 supine, 8 non-supine; 7.7 REM, 0.0 supine REM).  There were 0 respiratory effort-related arousals (RERAs).    OXIMETRY: Oxyhemoglobin Saturation Nadir during sleep was at  85% from a mean of 92%.  Of the Total sleep time (TST), hypoxemia (<89%) was  present for only 1.2 minutes, or < 0.5% of total sleep time.  LIMB MOVEMENTS: There were 0 periodic limb movements of sleep (0.0/h). AROUSAL: There were 26 arousals in total, for an arousal index of 6 arousals/hour.  Of these, 0 were identified as respiratory-related arousals (0 /h), 0 were PLM-related arousals (0 /h), and 20 were non-specific arousals (5 /h). EEG:  PSG EEG was of normal amplitude and frequency,  with symmetric manifestation of sleep stages. EKG: The electrocardiogram documented NSR/ PVCs/ atrial fibrillation.  The average heart rate during sleep was 60 bpm.  The heart rate during sleep varied between a minimum of 53 bpm and  a maximum of  73 bpm. AUDIO and VIDEO:  no complex motor activity during sleep, no sleep vocalization or enactment.   IMPRESSION:  Fragmented sleep without identifiable physiological cause. Presumed pain related.  1) Sleep disordered breathing was not present. There was very mild snoring present, no PLMs ,no cardiac arrhythmias, and no EKG abnormalities.    Total sleep time was reduced at 249.5 minutes.  Sleep efficiency was decreased at 60.0%.    Sleep fragmentation was noted- The majority of sleep arousals was unrelated to physiological causes as can be measured in a PSG.   RECOMMENDATIONS:  no sleep specific intervention is needed. Recommend further evaluation and treatment of chest pain and body pain.   Neomia Banner, MD            General Information  Name: [name], [name] BMI: 08.65 Physician: Neomia Banner, MD  ID: [id number] Height: 68.0 in Technician: Octavia Belton, RPSGT  Sex: Female Weight: 184.0 lb Record: [id number]  Age: 71 [[birth year]] Date: 12/12/2023    Medical & Medication History    [name] is a 78 y.o. female patient who is seen upon referral on 11/10/2023 from Dr Aron Lard, MD, for an evaluation of sleep problems. Chief concern according to patient : "Chronic insomnia, difficulties to go to sleep and stay asleep." I have arthritis pain and can't easily relax. Her BP has been elevated for the last 12 months, She wakes up sweating at night , leaking heart valve, aortic calcifications." I snore and my husband reports this to me, but he is almost deaf - and he can hear it. " Sleep habits are as follows: The patient's dinner time is between 5-6 PM. The patient goes to bed at 10 PM and has a difficult time to sleep- sometimes  taking hot shower, tylenol  PM, and the room is cool, quiet and dark. Continues to sleep for 2-3 hours, wakes at 1 AM and takes a bathroom break. She may stay awake until 2-3 AM and if she is able to return to sleep, only for 1-2 hours. Total sleep time 5 hours orl ess. The preferred sleep position is laterally, with the support of 1 pillow, flat bed. Dreams are reportedly infrequent/vivid. The patient wakes up spontaneously/ with an alarm. AM is the usual rise time. He/ She reports not feeling refreshed or restored in AM, with symptoms such as dry mouth, morning headaches, and residual fatigue. Naps are taken infrequently, she dozes off after lunch, not intentionally. lasting from 10 to 20 minutes and are not refreshing. ESS Total = 11/ 24 points FSS endorsed at 16/ 63 points.  Tylenol , Lotensin, Calcium + D, Keflex , Vitamin B12, Boniva, Synthroid , Estrace , Magnesium, Metamucil, Vigomaox, Multivitamin, Preservision Areds, Aleve, Deltasone , Trimpex    Sleep Disorder      Comments   Patient arrived for a diagnostic polysomnogram. Procedure explained and  all questions answered. Standard paste setup without complications. Patient was somewhat restless during study with arousals, awakenings, and changing positions. Patient slept supine, left, and right. Mild snoring was noted. Very few respiratory events observed. No obvious cardiac arrhythmias observed. Some mild PLMS observed. No restroom visits.    Lights out: 10:00:17 PM Lights on: 04:55:48 AM   Time Total Supine Side Prone Upright  Recording (TRT) 6h 55.75m 0h 39.36m 6h 16.78m 0h 0.52m 0h 0.40m  Sleep (TST) 4h 9.5m 0h 25.43m 3h 44.10m 0h 0.58m 0h 0.17m   Latency N1 N2 N3 REM Onset Per. Slp. Eff.  Actual 1h 13.34m 1h 18.64m 0h 0.29m 0h 55.106m 1h 13.37m 1h 14.22m 60.05%   Stg Dur Wake N1 N2 N3 REM  Total 92.5 30.5 195.5 0.0 23.5  Supine 4.0 1.0 24.5 0.0 0.0  Side 88.5 29.5 171.0 0.0 23.5  Prone 0.0 0.0 0.0 0.0 0.0  Upright 0.0 0.0 0.0 0.0 0.0   Stg % Wake N1 N2  N3 REM  Total 27.0 12.2 78.4 0.0 9.4  Supine 1.2 0.4 9.8 0.0 0.0  Side 25.9 11.8 68.5 0.0 9.4  Prone 0.0 0.0 0.0 0.0 0.0  Upright 0.0 0.0 0.0 0.0 0.0     Apnea Summary Sub Supine Side Prone Upright  Total 3 Total 3 0 3 0 0    REM 3 0 3 0 0    NREM 0 0 0 0 0  Obs 0 REM 0 0 0 0 0    NREM 0 0 0 0 0  Mix 2 REM 2 0 2 0 0    NREM 0 0 0 0 0  Cen 1 REM 1 0 1 0 0    NREM 0 0 0 0 0   Rera Summary Sub Supine Side Prone Upright  Total 0 Total 0 0 0 0 0    REM 0 0 0 0 0    NREM 0 0 0 0 0   Hypopnea Summary Sub Supine Side Prone Upright  Total 0 Total 0 0 0 0 0    REM 0 0 0 0 0    NREM 0 0 0 0 0   4% Hypopnea Summary Sub Supine Side Prone Upright  Total (4%) 0 Total 0 0 0 0 0    REM 0 0 0 0 0    NREM 0 0 0 0 0     AHI Total Obs Mix Cen  0.72 Apnea 0.72 0.00 0.48 0.24   Hypopnea 0.00 -- -- --  0.72 Hypopnea (4%) 0.00 -- -- --    Total Supine Side Prone Upright  Position AHI 0.72 0.00 0.80 0.00 0.00  REM AHI 7.66   NREM AHI 0.00   Position RDI 0.72 0.00 0.80 0.00 0.00  REM RDI 7.66   NREM RDI 0.00    4% Hypopnea Total Supine Side Prone Upright  Position AHI (4%) 0.72 0.00 0.80 0.00 0.00  REM AHI (4%) 7.66   NREM AHI (4%) 0.00   Position RDI (4%) 0.72 0.00 0.80 0.00 0.00  REM RDI (4%) 7.66   NREM RDI (4%) 0.00    Desaturation Information Threshold: 2% <100% <90% <80% <70% <60% <50% <40%  Supine 1.0 1.0 0.0 0.0 0.0 0.0 0.0  Side 50.0 18.0 0.0 0.0 0.0 0.0 0.0  Prone 0.0 0.0 0.0 0.0 0.0 0.0 0.0  Upright 0.0 0.0 0.0 0.0 0.0 0.0 0.0  Total 51.0 19.0 0.0 0.0 0.0 0.0 0.0  Index 8.9 3.3 0.0 0.0 0.0  0.0 0.0   Threshold: 3% <100% <90% <80% <70% <60% <50% <40%  Supine 1.0 1.0 0.0 0.0 0.0 0.0 0.0  Side 12.0 6.0 0.0 0.0 0.0 0.0 0.0  Prone 0.0 0.0 0.0 0.0 0.0 0.0 0.0  Upright 0.0 0.0 0.0 0.0 0.0 0.0 0.0  Total 13.0 7.0 0.0 0.0 0.0 0.0 0.0  Index 2.3 1.2 0.0 0.0 0.0 0.0 0.0   Threshold: 4% <100% <90% <80% <70% <60% <50% <40%  Supine 1.0 1.0 0.0 0.0 0.0 0.0 0.0  Side 8.0 6.0  0.0 0.0 0.0 0.0 0.0  Prone 0.0 0.0 0.0 0.0 0.0 0.0 0.0  Upright 0.0 0.0 0.0 0.0 0.0 0.0 0.0  Total 9.0 7.0 0.0 0.0 0.0 0.0 0.0  Index 1.6 1.2 0.0 0.0 0.0 0.0 0.0   Threshold: 4% <100% <90% <80% <70% <60% <50% <40%  Supine 1 1 0 0 0 0 0  Side 8 6 0 0 0 0 0  Prone 0 0 0 0 0 0 0  Upright 0 0 0 0 0 0 0  Total 9 7 0 0 0 0 0   Awakening/Arousal Information # of Awakenings 14  Wake after sleep onset 93.12m  Wake after persistent sleep 92.29m   Arousal Assoc. Arousals Index  Apneas 0 0.0  Hypopneas 0 0.0  Leg Movements 8 1.9  Snore 0 0.0  PTT Arousals 0 0.0  Spontaneous 20 4.8  Total 28 6.7  Leg Movement Information PLMS LMs Index  Total LMs during PLMS 0 0.0  LMs w/ Microarousals 0 0.0   LM LMs Index  w/ Microarousal 8 1.9  w/ Awakening 0 0.0  w/ Resp Event 0 0.0  Spontaneous 13 3.1  Total 21 5.1     Desaturation threshold setting: 4% Minimum desaturation setting: 10 seconds SaO2 nadir: 81% The longest event was a 20 sec mixed Apnea with a minimum SaO2 of 89%. The lowest SaO2 was 87% associated with a 12 sec central Apnea. EKG Rates EKG Avg Max Min  Awake 60 72 55  Asleep 60 73 53  EKG Events: N/A

## 2024-01-10 ENCOUNTER — Encounter: Payer: Medicare Other | Admitting: Physical Therapy

## 2024-01-10 ENCOUNTER — Encounter: Payer: Self-pay | Admitting: Physical Therapy

## 2024-01-10 DIAGNOSIS — R278 Other lack of coordination: Secondary | ICD-10-CM

## 2024-01-10 DIAGNOSIS — M6281 Muscle weakness (generalized): Secondary | ICD-10-CM

## 2024-01-10 NOTE — Telephone Encounter (Signed)
 Called the patient and was able to review in detail her sleep study results. Advised that sleep fragmentation was present and mild snoring but there was no organic sleep disorder present. Pt verbalized understanding. Pt had no questions at this time but was encouraged to call back if questions arise.

## 2024-01-10 NOTE — Therapy (Signed)
 OUTPATIENT PHYSICAL THERAPY FEMALE PELVIC TREATMENT   Patient Name: [name] MRN: [id number] DOB:Jun 13, 1946, 78 y.o., female Today's Date: 01/10/2024  END OF SESSION:  PT End of Session - 01/10/24 1156     Visit Number 6    Date for PT Re-Evaluation 01/10/24    Authorization Type [employer] medicare    Authorization - Visit Number 6    Authorization - Number of Visits 10    PT Start Time 1130    PT Stop Time 1215    PT Time Calculation (min) 45 min    Activity Tolerance Patient tolerated treatment well    Behavior During Therapy [facility] for tasks assessed/performed              Past Medical History:  Diagnosis Date   Bilateral ovarian cysts 09/12/2014   bilateral simple ovarian cysts.  US  due in January 2018.   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 x 20 yr.   Hyperlipidemia    Hypothyroid    IBS (irritable bowel syndrome)    Impingement syndrome of right shoulder 07/2013   Osteoporosis    Polymyalgia rheumatica (HCC)    5-28  states she has been on prednisone  and now is trying to only be on plaquenil  to manage the polymyalgia    PONV (postoperative nausea and vomiting)    Primary localized osteoarthritis of right knee    Rupture of right rotator cuff 07/2013   VIN I (vulvar intraepithelial neoplasia I) 2017, 2018   this is from a perineal/vulvar biopsy.   Vitamin B12 deficiency    Past Surgical History:  Procedure Laterality Date   ANTERIOR LAT LUMBAR FUSION  10/08/2008   L3-4   BACK SURGERY     BACK SURGERY  10/03/2014   --Dr. Tawana Fast at [facility]   CATARACT EXTRACTION, BILATERAL     03/2023, 04/2023   COLONOSCOPY W/ BIOPSIES  11/2009   polyps   COLONOSCOPY W/ BIOPSIES  11/2012   polyp, recheck in 3 years   CYSTO  07/05/2006   CYSTOCELE REPAIR  07/05/2006   ESOPHAGOGASTRODUODENOSCOPY ENDOSCOPY  05/25/2005   erythema and edema of gastric lumen    EYE SURGERY Right    muscle adjustment    FOOT SURGERY Right    GANGLION CYST EXCISION Right    wrist   INCONTINENCE SURGERY  07/05/2006   cystourethropexy   ORIF FOREARM FRACTURE Right 09/2012   SHOULDER ARTHROSCOPY WITH ROTATOR CUFF REPAIR AND SUBACROMIAL DECOMPRESSION Right 08/02/2013   Procedure: SHOULDER ARTHROSCOPY WITH ROTATOR CUFF REPAIR AND SUBACROMIAL DECOMPRESSION, EXTENSIVE DEBRIDEMENT (INCLUDING LABRUM), PARTIAL ACROMIPLASY WITH CORACOACROMIAL RELEASE, DISTAL CLAVICULECTOMY;  Surgeon: Ferd Householder, MD;  Location: [facility];  Service: Orthopedics;  Laterality: Right;   STRABISMUS SURGERY Right 01/26/2008   2 other surgeries previously   STRABISMUS SURGERY Right 06/03/2017   Procedure: REPAIR STRABISMUS RIGHT EYE;  Surgeon: Dorothey Gate, MD;  Location: [facility];  Service: Ophthalmology;  Laterality: Right;   Thumb surgery  2/13 & ?2010   Bilateral - states a joint was replaced   TOTAL KNEE ARTHROPLASTY Right 02/12/2019   Procedure: TOTAL KNEE ARTHROPLASTY;  Surgeon: Elly Habermann, MD;  Location: [facility];  Service: Orthopedics;  Laterality: Right;   TOTAL KNEE REVISION Right 05/12/2022   Procedure: Right knee polyethylene versus total knee arthroplasty revision;  Surgeon: Liliane Rei, MD;  Location: [facility];  Service: Orthopedics;  Laterality: Right;   TOTAL VAGINAL HYSTERECTOMY  83 - or age 53   secondary AUB   TUBAL LIGATION     laparosopic BTL age 63   varicose veins Bilateral    Patient Active Problem List   Diagnosis Date Noted   Loud snoring 11/10/2023   Heart palpitations 11/10/2023   Diaphoresis 11/10/2023   Sleep related leg cramps 11/10/2023   Nonrheumatic aortic valve stenosis 11/10/2023   Fitting and adjustment of pessary 10/12/2023   Microscopic hematuria 10/12/2023   History of recurrent UTI (urinary tract infection) 09/01/2023   History of pelvic surgery 09/01/2023   Urinary incontinence, mixed 09/01/2023    Dysuria 09/01/2023   Nocturia 09/01/2023   Vaginal atrophy 09/01/2023   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   Precordial chest pain 12/31/2021   Primary localized osteoarthritis of right knee 10/19/2018   Right foot pain 10/19/2018   Incontinence of feces 09/21/2018   Vulvar lesion 07/20/2018   History of repair of right rotator cuff 03/03/2017   Primary osteoarthritis of both hands/ has had bilateral [facility] surgery  03/03/2017   History of hypertension 03/03/2017   History of hyperlipidemia 03/03/2017   DDD (degenerative disc disease), lumbar/ history of lumbar surgery  03/03/2017   Osteopenia of multiple sites 03/03/2017   Chronic left shoulder pain 01/20/2017   Primary osteoarthritis of both knees 01/20/2017   Trapezius muscle spasm 01/20/2017   Insomnia 01/20/2017   Fatigue 01/20/2017   Fibromyalgia 01/20/2017   High risk medications (not anticoagulants) long-term use 01/20/2017   PMR (polymyalgia rheumatica) (HCC) 01/20/2017   Left ovarian cyst 06/02/2015   Degenerative joint disease, shoulder, right 08/02/2013   Precordial pain 02/11/2012   HTN (hypertension) 02/11/2012   Hyperlipidemia 02/11/2012    PCP: Candiss Chamorro, MD  REFERRING PROVIDER: Darlene Ehlers, MD   REFERRING DIAG:  [phone] (ICD-10-CM) - Urinary incontinence, mixed  R15.9 (ICD-10-CM) - Incontinence of feces, unspecified fecal incontinence type  Z98.890 (ICD-10-CM) - History of pelvic surgery  Z87.440 (ICD-10-CM) - History of recurrent UTI (urinary tract infection)  R35.1 (ICD-10-CM) - Nocturia    THERAPY DIAG:  Muscle weakness (generalized)  Other lack of coordination  Rationale for Evaluation and Treatment: Rehabilitation  ONSET DATE: 2023  SUBJECTIVE:                                                                                                                                                                                            SUBJECTIVE STATEMENT: I am still waiting for them to schedule the surgery.  Fluid intake: Yes: water , milk, coffee, pepsi zero    PAIN:  Are you having pain? Yes NPRS scale: 3/10 Pain location:  right low back  Pain type: aching Pain description: constant   Aggravating factors: standing,  Relieving factors: medication  PRECAUTIONS: None  RED FLAGS: None   WEIGHT BEARING RESTRICTIONS: No  FALLS:  Has patient fallen in last 6 months? No, door opened quickly and knocked her down and not due to balance.   LIVING ENVIRONMENT: Lives with: lives with their spouse   OCCUPATION: makes cakes, standing  PLOF: Independent  PATIENT GOALS: reduce leakage  PERTINENT HISTORY:  S/p possible [employer] midurethral sling Dr. Clarke Crouch 07/05/06 ; Fibromyalgia; HTN; Osteoporosis; IBS; Polymyalgia rheumatica; vulvar intraepithelial neoplasia; Total vaginal hysterectomy  1986; Anterior lat lumbar fusion 2010; fractured pelvis   BOWEL MOVEMENT: Pain with bowel movement: No Leakage: Yes: only when she eats a lot of chocolate Pads: Yes:   Fiber supplement: Yes:    URINATION: Pain with urination: No Fully empty bladder: No, PVR is 70 ml Stream: Weak Urgency: Yes:   Frequency: average Leakage: waiting too long, carrying something heavy, coughing, laughing. 12/15/23: urinary leakage with waiting too long to go to the bathroom, heavy lifting, not laughing  Pads: 1-2 pads/day   INTERCOURSE: not active  PREGNANCY: Vaginal deliveries 3 Tearing Yes:       OBJECTIVE:  Note: Objective measures were completed at Evaluation unless otherwise noted.  DIAGNOSTIC FINDINGS:  Urodynamic Impression 10/05/23:  Uroflow Qmax of 17 mL/sec, voided 210 mL and residual of 300 mL.  1. Sensation was increased; capacity was normal 2. Stress Incontinence was demonstrated at ISD range pressures; MUCP 29 cmH2O 3. Detrusor Overactivity was not demonstrated. Normal compliance 4. Emptying was  dysfunctional with a elevated PVR ( ), a sustained detrusor contraction present,  abdominal straining present, dyssynergic urethral sphincter activity on EMG. Pdet at Qmax was 13 cm of water .  Qmax was 10 mL/sec.  Repeat TVUS 09/22/23 "Uterus:  absent.  Left ovary:  4.2 x 3.97 x 3.30 cm.  Simple ovarian cyst 3.39 cm, avascular.  Right ovary:  2.26 x 1.55 x 1.46 cm. 1.8 cm simple avascular cyst noted adjacent to the ovary.  No free fluid." Yearly pelvic US  per Dr. Finas Huger  PVR was 70mL   PATIENT SURVEYS:  UIQ-7 24 PFIQ-7 22  COGNITION: Overall cognitive status: Within functional limits for tasks assessed     SENSATION: Light touch: Appears intact Proprioception: Appears intact   POSTURE: rounded shoulders, forward head, decreased lumbar lordosis, and flexed trunk     LOWER EXTREMITY ROM:  Passive ROM Right eval Left eval Right 01/10/24 Left  01/10/24  Hip abduction 20 5 20 20   Hip internal rotation 14 45 20 20  Hip external rotation 35 25 40 50   (Blank rows = not tested)  LOWER EXTREMITY MMT:  MMT Right eval Left eval Right 01/10/24 Left  01/10/24  Hip extension 4/5 4/5 4/5 4/5  Hip abduction 3/5 3/5 3/5 3/5  Hip adduction 4/5 4/5 4/5 4/5   PALPATION:   General  tightness in the abdominal muscles and diaphragm, decreased movement of the lower rib cage                External Perineal Exam slight redness along the posterior fourchette                             Internal Pelvic Floor tenderness located  on the left iliococcygeus, initially she would bulge down to contract the pelvic floor and she bulged when laughing. Firmness along the perineal body  Patient confirms identification and approves PT to assess internal pelvic floor and treatment Yes  PELVIC MMT:   MMT eval 11/03/23 12/06/23  Vaginal 1/5 1/5 2/5 with lots of verbal cues  (Blank rows = not tested)        TONE: average  TODAY'S TREATMENT:      01/10/24 Exercises: Stretches/mobility: Diaphragmatic breathing in supine with verbal cues to not bear down instead let the air move slowly to the pelvic floor Strengthening: Supine marching with abdominal and pelvic floor contraction with verbal cues to go slowly  Bridge 15 x Seated bilateral shoulder external rotation with red band and pelvic floor contraction Seated pressing into small ball to engage the pelvic floor and abdominals Seated squeeze ball with pelvic floor contraction     12/15/23 Neuromuscular re-education: Pelvic floor contraction training:( all exercises engage the pelvic floor and core) Seated squeeze ball and contract the pelvic floor feeling it lift off the chair Seated pressing into small ball to engage the pelvic floor and abdominals Squeeze ball with shoulder external rotation in sitting  Supine transverse abdominus contraction with pelvic floor contraction  Supine marching with abdominal and pelvic floor contraction Down training: Diaphragmatic breathing in sitting with therapist giving tactile cues to not lift the chest.  Therapeutic activities: Functional strengthening activities: Educated patient on correct ways to have a bowel movement so she does not strain after surgery Educated patient on lifting with behind her hips and knees, breath out while lifting, keeping the distance between the rib cage and pubic bone.     12/06/23 Manual: Internal pelvic floor techniques: No emotional/communication barriers or cognitive limitation. Patient is motivated to learn. Patient understands and agrees with treatment goals and plan. PT explains patient will be examined in standing, sitting, and lying down to see how their muscles and joints work. When they are ready, they will be asked to remove their underwear so PT can examine their perineum. The patient is also given the option of providing their own chaperone as one is not provided in our facility. The patient also has  the right and is explained the right to defer or refuse any part of the evaluation or treatment including the internal exam. With the patient's consent, PT will use one gloved finger to gently assess the muscles of the pelvic floor, seeing how well it contracts and relaxes and if there is muscle symmetry. After, the patient will get dressed and PT and patient will discuss exam findings and plan of care. PT and patient discuss plan of care, schedule, attendance policy and HEP activities.  Going internally through the vagina working on the introitus, obturator internist,  and levator ani tissue for a pelvic floor contraction Neuromuscular re-education: Pelvic floor contraction training: Therapist finger in the vaginal canal working on pelvic floor contraction and not bearing down and contracting her gluteals Sitting leaning forward contracting the anterior pelvic floor and feel it lift off the chair Sitting leaning back and contract the pelvic floor and feel it the lift Lean to the side to contract the pelvic floor Down training: Diaphragmatic breathing while therapist finger in the vaginal canal to feel the muscles relax Therapeutic activities: Functional strengthening activities: Educated patient on flexing at her hips and not waist when lifting items and keeping the distance between the rib cage and pubic bone. Also not holding her  breath.  Educated patient on sitting with legs up to reduce the prolapse, educated her on strengthening the pelvic floor to support the prolapse        PATIENT EDUCATION:  01/10/24 Education details: Access Code: [email or web address], educated patient on vaginal moisturizer for daily use and how to apply Person educated: Patient Education method: Explanation, Demonstration, Tactile cues, Verbal cues, and Handouts Education comprehension: verbalized understanding, returned demonstration, verbal cues required, tactile cues required, and needs further education  HOME EXERCISE  PROGRAM: 01/10/24 Access Code: [email or web address] URL: [email or web address] Date: 11/03/2023 Prepared by: Marsha Skeen  Exercises - Supine Pelvic Floor Contraction  - 3 x daily - 7 x weekly - 1 sets - 10 reps - Supine Diaphragmatic Breathing  - 1 x daily - 3 x weekly - 1 sets - 10 reps - Hooklying Transversus Abdominis Palpation  - 1 x daily - 3 x weekly - 1 sets - 10 reps - Supine March  - 1 x daily - 3 x weekly - 1 sets - 10 reps - Supine Bridge with Pelvic Floor Contraction  - 1 x daily - 3 x weekly - 1 sets - 10 reps - Seated Hip Adduction Isometrics with Ball  - 2 x daily - 7 x weekly - 1 sets - 10 reps - Seated Abdominal Press into [employer]  - 2 x daily - 3 x weekly - 1 sets - 10 reps - Seated Shoulder W External Rotation on Swiss Ball  - 1 x daily - 3 x weekly - 3 sets - 10 reps  ASSESSMENT:  CLINICAL IMPRESSION: Patient is a 78 y.o. female who was seen today for physical therapy  treatment for urinary and fecal incontinence. Patient has had several nights she has not worn a pad. Patient is only getting up 1 time per night. She is doing her HEP consistently. Her urinary leakage  and urgency is 60% better. She is able to contract her abdominal without bulging the lower abdominals. She has increased bilateral hip ROM. She understands how to manage the pelvic floor pressure to unload the prolapse. She leaned ways to move around after surgery.   OBJECTIVE IMPAIRMENTS: decreased coordination, decreased endurance, decreased strength, and increased fascial restrictions.   ACTIVITY LIMITATIONS: lifting, bending, continence, and locomotion level  PARTICIPATION LIMITATIONS: shopping and community activity  PERSONAL FACTORS: Fitness and 3+ comorbidities: S/p possible [employer] midurethral sling Dr. Clarke Crouch 07/05/06 ; Fibromyalgia; HTN; Osteoporosis; IBS; Polymyalgia rheumatica; vulvar intraepithelial neoplasia; Total vaginal hysterectomy  1986; Anterior lat lumbar fusion 2010; fractured  pelvis   are also affecting patient's functional outcome.   REHAB POTENTIAL: Excellent  CLINICAL DECISION MAKING: Evolving/moderate complexity  EVALUATION COMPLEXITY: Moderate   GOALS: Goals reviewed with patient? Yes  SHORT TERM GOALS: Target date: 11/15/23  Patient independent with initial HEP for hip stretches and abdominal massage.  Baseline: Goal status: Met 12/15/23  2.  Patient is able to demonstrate diaphragmatic breathing to expand the lower rib cage and relax the pelvic floor.  Baseline:  Goal status: Met 10/25/23  3.  Patient educated on proper lifting techniques to flex at her hips and not waist.  Baseline:  Goal status: Met 10/25/23   LONG TERM GOALS: Target date: 01/10/24  Patient independent with advanced HEP for core and pelvic floor strength.  Baseline:  Goal status: Met  01/10/24  2.  Patient reports her urinary leakage with coughing and sneezing has decreased >/= 60% due to increased strength and coordination of the pelvic floor.  Baseline: 60%  better Goal status: met 01/10/24  3.  Patient understands pressure management to reduce strain on her pelvic floor and reduce urinary leakage.  Baseline:  Goal status: Met 01/10/24  4.  Patient reports her urinary urgency has decreased >/= 60% due to using the urge to void behavioral technique.  Baseline: Urinary urgency is 60 % better.  Goal status: met 01/10/24    PLAN: Discharge to home exercise program this visit.     Marsha Skeen, PT 01/10/24 12:58 PM  PHYSICAL THERAPY DISCHARGE SUMMARY  Visits from Start of Care: 6  Current functional level related to goals / functional outcomes: See above.    Remaining deficits: See above.    Education / Equipment: HEP   Patient agrees to discharge. Patient goals were met. Patient is being discharged due to meeting the stated rehab goals. Thank you for the referral.   Marsha Skeen, PT 01/10/24 12:58 PM

## 2024-01-18 ENCOUNTER — Encounter: Payer: Self-pay | Admitting: Obstetrics and Gynecology

## 2024-01-18 ENCOUNTER — Other Ambulatory Visit: Payer: Self-pay | Admitting: Obstetrics and Gynecology

## 2024-01-18 ENCOUNTER — Ambulatory Visit (INDEPENDENT_AMBULATORY_CARE_PROVIDER_SITE_OTHER): Admitting: Obstetrics and Gynecology

## 2024-01-18 VITALS — BP 153/89 | HR 67 | Ht 68.11 in | Wt 185.4 lb

## 2024-01-18 DIAGNOSIS — Z01818 Encounter for other preprocedural examination: Secondary | ICD-10-CM

## 2024-01-18 MED ORDER — IBUPROFEN 600 MG PO TABS: 600.0000 mg | ORAL_TABLET | Freq: Four times a day (QID) | ORAL | 0 refills | Status: DC | PRN

## 2024-01-18 MED ORDER — POLYETHYLENE GLYCOL 3350 17 GM/SCOOP PO POWD: 17.0000 g | Freq: Every day | ORAL | 0 refills | Status: DC

## 2024-01-18 MED ORDER — ACETAMINOPHEN 500 MG PO TABS: 500.0000 mg | ORAL_TABLET | Freq: Four times a day (QID) | ORAL | 0 refills | Status: DC | PRN

## 2024-01-18 MED ORDER — OXYCODONE HCL 5 MG PO TABS: 5.0000 mg | ORAL_TABLET | ORAL | 0 refills | Status: DC | PRN

## 2024-01-18 NOTE — Progress Notes (Signed)
 [facility] Pre-Operative Exam  Subjective Chief Complaint: [name] presents for a preoperative encounter.   History of Present Illness: [name] is a 78 y.o. female who presents for preoperative visit.  She is scheduled to undergo Exam under anesthesia, Colporrhaphy anterior for cystocele repair, colporrhaphy posterior for rectocele repair, cystoscopy with bladder biopsy and fulguration of bladder lesions on 02/15/24.  Her symptoms include Pelvic organ prolapse, and she was was found to have Stage I anterior, Stage I posterior, Stage 0 apical prolapse.   Urodynamics showed: Urodynamic Impression:  1. Sensation was increased; capacity was normal 2. Stress Incontinence was demonstrated at ISD range pressures; 3. Detrusor Overactivity was not demonstrated. 4. Emptying was dysfunctional with a elevated PVR ( ), a sustained detrusor contraction present,  abdominal straining present, dyssynergic urethral sphincter activity on EMG.  Past Medical History:  Diagnosis Date   Bilateral ovarian cysts 09/12/2014   bilateral simple ovarian cysts.  US  due in January 2018.   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 x 20 yr.   Hyperlipidemia    Hypothyroid    IBS (irritable bowel syndrome)    Impingement syndrome of right shoulder 07/2013   Osteoporosis    Polymyalgia rheumatica (HCC)    5-28  states she has been on prednisone  and now is trying to only be on plaquenil  to manage the polymyalgia    PONV (postoperative nausea and vomiting)    Primary localized osteoarthritis of right knee    Rupture of right rotator cuff 07/2013   VIN I (vulvar intraepithelial neoplasia I) 2017, 2018   this is from a perineal/vulvar biopsy.   Vitamin B12 deficiency      Past Surgical History:  Procedure Laterality Date   ANTERIOR LAT LUMBAR FUSION  10/08/2008   L3-4   BACK  SURGERY     BACK SURGERY  10/03/2014   --Dr. Tawana Fast at [facility]   CATARACT EXTRACTION, BILATERAL     03/2023, 04/2023   COLONOSCOPY W/ BIOPSIES  11/2009   polyps   COLONOSCOPY W/ BIOPSIES  11/2012   polyp, recheck in 3 years   CYSTO  07/05/2006   CYSTOCELE REPAIR  07/05/2006   ESOPHAGOGASTRODUODENOSCOPY ENDOSCOPY  05/25/2005   erythema and edema of gastric lumen   EYE SURGERY Right    muscle adjustment    FOOT SURGERY Right    GANGLION CYST EXCISION Right    wrist   INCONTINENCE SURGERY  07/05/2006   cystourethropexy   ORIF FOREARM FRACTURE Right 09/2012   SHOULDER ARTHROSCOPY WITH ROTATOR CUFF REPAIR AND SUBACROMIAL DECOMPRESSION Right 08/02/2013   Procedure: SHOULDER ARTHROSCOPY WITH ROTATOR CUFF REPAIR AND SUBACROMIAL DECOMPRESSION, EXTENSIVE DEBRIDEMENT (INCLUDING LABRUM), PARTIAL ACROMIPLASY WITH CORACOACROMIAL RELEASE, DISTAL CLAVICULECTOMY;  Surgeon: Ferd Householder, MD;  Location: [facility];  Service: Orthopedics;  Laterality: Right;   STRABISMUS SURGERY Right 01/26/2008   2 other surgeries previously   STRABISMUS SURGERY Right 06/03/2017   Procedure: REPAIR STRABISMUS RIGHT EYE;  Surgeon: Dorothey Gate, MD;  Location: [facility];  Service: Ophthalmology;  Laterality: Right;   Thumb surgery  2/13 & ?2010   Bilateral - states a joint was replaced   TOTAL KNEE ARTHROPLASTY Right 02/12/2019   Procedure: TOTAL KNEE ARTHROPLASTY;  Surgeon: Elly Habermann, MD;  Location: [facility];  Service: Orthopedics;  Laterality: Right;   TOTAL KNEE REVISION Right 05/12/2022  Procedure: Right knee polyethylene versus total knee arthroplasty revision;  Surgeon: Liliane Rei, MD;  Location: [facility];  Service: Orthopedics;  Laterality: Right;   TOTAL VAGINAL HYSTERECTOMY  1986 - or age 32   secondary AUB   TUBAL LIGATION     laparosopic BTL age 43   varicose veins Bilateral     is allergic to penicillins and sulfa antibiotics.   Family History   Problem Relation Age of Onset   Coronary artery disease Mother 54   Hypertension Mother    Aortic aneurysm Father    Hypertension Father    Prostate cancer Father    Hyperlipidemia Brother        brain and kidney tumor, no treatment needed    Aneurysm Brother    Bladder Cancer Paternal Grandmother    Healthy Daughter    Healthy Daughter    Healthy Daughter    Uterine cancer Neg Hx     Social History   Tobacco Use   Smoking status: Never    Passive exposure: Past   Smokeless tobacco: Never  Vaping Use   Vaping status: Never Used  Substance Use Topics   Alcohol use: No    Alcohol/week: 0.0 standard drinks of alcohol   Drug use: No     Review of Systems was negative for a full 10 system review except as noted in the History of Present Illness.   Current Outpatient Medications:    Acetaminophen  (TYLENOL  PO), Take by mouth as needed., Disp: , Rfl:    acetaminophen  (TYLENOL ) 500 MG tablet, Take 1 tablet (500 mg total) by mouth every 6 (six) hours as needed (pain)., Disp: 30 tablet, Rfl: 0   amLODipine  (NORVASC ) 5 MG tablet, Take 5 mg by mouth daily., Disp: , Rfl:    benazepril (LOTENSIN) 40 MG tablet, Take 40 mg by mouth daily. , Disp: , Rfl:    Calcium Carb-Cholecalciferol (CALCIUM 600 + D PO), Take 1 tablet by mouth daily., Disp: , Rfl:    cyanocobalamin (VITAMIN B12) 1000 MCG tablet, Take 1,000 mcg by mouth daily., Disp: , Rfl:    estradiol  (ESTRACE ) 0.1 MG/GM vaginal cream, Place 0.5 g vaginally 2 (two) times a week. Place 0.5g nightly for two weeks then twice a week after, Disp: 30 g, Rfl: 3   ibandronate (BONIVA) 150 MG tablet, Take 150 mg by mouth every 30 (thirty) days. Take in the morning with a full glass of water , on an empty stomach, and do not take anything else by mouth or lie down for the next 30 min., Disp: , Rfl:    ibuprofen (ADVIL) 600 MG tablet, Take 1 tablet (600 mg total) by mouth every 6 (six) hours as needed., Disp: 30 tablet, Rfl: 0   levothyroxine   (SYNTHROID ) 125 MCG tablet, Take 125 mcg by mouth daily before breakfast., Disp: , Rfl:    Magnesium 250 MG TABS, Take 500 mg by mouth daily., Disp: , Rfl:    METAMUCIL FIBER PO, Take 6 capsules by mouth daily., Disp: , Rfl:    Multiple Vitamin (MULTIVITAMIN) tablet, Take 1 tablet by mouth daily., Disp: , Rfl:    Multiple Vitamins-Minerals (PRESERVISION AREDS PO), Take by mouth daily., Disp: , Rfl:    Naproxen Sodium (ALEVE PO), Take by mouth as needed., Disp: , Rfl:    oxyCODONE  (OXY IR/ROXICODONE ) 5 MG immediate release tablet, Take 1 tablet (5 mg total) by mouth every 4 (four) hours as needed for severe pain (pain score 7-10)., Disp: 15 tablet, Rfl:  0   polyethylene glycol powder (GLYCOLAX /MIRALAX ) 17 GM/SCOOP powder, Take 17 g by mouth daily. Drink 17g (1 scoop) dissolved in water  per day., Disp: 255 g, Rfl: 0   Objective Vitals:   01/18/24 0924  BP: (!) 153/89  Pulse: 67    Gen: NAD CV: S1 S2 RRR Lungs: Clear to auscultation bilaterally Abd: soft, nontender   Previous Pelvic Exam showed: POP-Q   -1                                            Aa   -1                                           Ba   -7                                              C    3                                            Gh   4                                            Pb   7                                            tvl    -2                                            Ap   -2                                            Bp                                            Assessment/ Plan  Assessment: The patient is a 78 y.o. year old scheduled to undergo Exam under anesthesia, Colporrhaphy anterior for cystocele repair, colporrhaphy posterior for rectocele repair, cystoscopy with bladder biopsy and fulguration of bladder lesions. Verbal consent was obtained for these procedures.  Plan: General Surgical Consent: The patient has previously been counseled on alternative treatments, and the  decision by the patient and provider was to proceed with the procedure listed above.  For all procedures, there are risks of bleeding, infection, damage to surrounding organs including but not limited to bowel, bladder, blood vessels, ureters and nerves, and need for further surgery if an injury  were to occur. These risks are all low with minimally invasive surgery.   There are risks of numbness and weakness at any body site or buttock/rectal pain.  It is possible that baseline pain can be worsened by surgery, either with or without mesh. If surgery is vaginal, there is also a low risk of possible conversion to laparoscopy or open abdominal incision where indicated. Very rare risks include blood transfusion, blood clot, heart attack, pneumonia, or death.   There is also a risk of short-term postoperative urinary retention with need to use a catheter. About half of patients need to go home from surgery with a catheter, which is then later removed in the office. The risk of long-term need for a catheter is very low. There is also a risk of worsening of overactive bladder.    Prolapse (with or without mesh): Risk factors for surgical failure  include things that put pressure on your pelvis and the surgical repair, including obesity, chronic cough, and heavy lifting or straining (including lifting children or adults, straining on the toilet, or lifting heavy objects such as furniture or anything weighing >25 lbs. Risks of recurrence is 20-30% with vaginal native tissue repair and a less than 10% with sacrocolpopexy with mesh.    We discussed consent for blood products. Risks for blood transfusion include allergic reactions, other reactions that can affect different body organs and managed accordingly, transmission of infectious diseases such as HIV or Hepatitis. However, the blood is screened. Patient consents for blood products.  Pre-operative instructions:  She was instructed to not take Aspirin /NSAIDs x  7days prior to surgery. Antibiotic prophylaxis was ordered as indicated. Encouraged to hold turmeric and garlic supplementation.   Catheter use: Patient will go home with foley if needed after post-operative voiding trial.  Post-operative instructions:  She was provided with specific post-operative instructions, including precautions and signs/symptoms for which we would recommend contacting us , in addition to daytime and after-hours contact phone numbers. This was provided on a handout.   Post-operative medications: Prescriptions for motrin, tylenol , miralax , and oxycodone  were sent to her pharmacy. Discussed using ibuprofen and tylenol  on a schedule to limit use of narcotics.   Laboratory testing:  We will check labs:As requested by anesthesia.   Preoperative clearance:  She does not require surgical clearance.    Post-operative follow-up:  A post-operative appointment will be made for 6 weeks from the date of surgery. If she needs a post-operative nurse visit for a voiding trial, that will be set up after she leaves the hospital.    Patient will call the clinic or use MyChart should anything change or any new issues arise.   Eriverto Byrnes G Danie Diehl, NP

## 2024-01-18 NOTE — H&P (Signed)
 [facility] H&P  Subjective Chief Complaint: [name] presents for a preoperative encounter.   History of Present Illness: [name] is a 78 y.o. female who presents for preoperative visit.  She is scheduled to undergo Exam under anesthesia, Colporrhaphy anterior for cystocele repair, colporrhaphy posterior for rectocele repair, cystoscopy with bladder biopsy and fulguration of bladder lesions on 02/15/24.  Her symptoms include Pelvic organ prolapse, and she was was found to have Stage I anterior, Stage I posterior, Stage 0 apical prolapse.   Urodynamics showed: Urodynamic Impression:  1. Sensation was increased; capacity was normal 2. Stress Incontinence was demonstrated at ISD range pressures; 3. Detrusor Overactivity was not demonstrated. 4. Emptying was dysfunctional with a elevated PVR ( ), a sustained detrusor contraction present,  abdominal straining present, dyssynergic urethral sphincter activity on EMG.  Past Medical History:  Diagnosis Date   Bilateral ovarian cysts 09/12/2014   bilateral simple ovarian cysts.  US  due in January 2018.   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 x 20 yr.   Hyperlipidemia    Hypothyroid    IBS (irritable bowel syndrome)    Impingement syndrome of right shoulder 07/2013   Osteoporosis    Polymyalgia rheumatica (HCC)    5-28  states she has been on prednisone  and now is trying to only be on plaquenil  to manage the polymyalgia    PONV (postoperative nausea and vomiting)    Primary localized osteoarthritis of right knee    Rupture of right rotator cuff 07/2013   VIN I (vulvar intraepithelial neoplasia I) 2017, 2018   this is from a perineal/vulvar biopsy.   Vitamin B12 deficiency      Past Surgical History:  Procedure Laterality Date   ANTERIOR LAT LUMBAR FUSION  10/08/2008   L3-4   BACK SURGERY     BACK  SURGERY  10/03/2014   --Dr. Tawana Fast at [facility]   CATARACT EXTRACTION, BILATERAL     03/2023, 04/2023   COLONOSCOPY W/ BIOPSIES  11/2009   polyps   COLONOSCOPY W/ BIOPSIES  11/2012   polyp, recheck in 3 years   CYSTO  07/05/2006   CYSTOCELE REPAIR  07/05/2006   ESOPHAGOGASTRODUODENOSCOPY ENDOSCOPY  05/25/2005   erythema and edema of gastric lumen   EYE SURGERY Right    muscle adjustment    FOOT SURGERY Right    GANGLION CYST EXCISION Right    wrist   INCONTINENCE SURGERY  07/05/2006   cystourethropexy   ORIF FOREARM FRACTURE Right 09/2012   SHOULDER ARTHROSCOPY WITH ROTATOR CUFF REPAIR AND SUBACROMIAL DECOMPRESSION Right 08/02/2013   Procedure: SHOULDER ARTHROSCOPY WITH ROTATOR CUFF REPAIR AND SUBACROMIAL DECOMPRESSION, EXTENSIVE DEBRIDEMENT (INCLUDING LABRUM), PARTIAL ACROMIPLASY WITH CORACOACROMIAL RELEASE, DISTAL CLAVICULECTOMY;  Surgeon: Ferd Householder, MD;  Location: [facility];  Service: Orthopedics;  Laterality: Right;   STRABISMUS SURGERY Right 01/26/2008   2 other surgeries previously   STRABISMUS SURGERY Right 06/03/2017   Procedure: REPAIR STRABISMUS RIGHT EYE;  Surgeon: Dorothey Gate, MD;  Location: [facility];  Service: Ophthalmology;  Laterality: Right;   Thumb surgery  2/13 & ?2010   Bilateral - states a joint was replaced   TOTAL KNEE ARTHROPLASTY Right 02/12/2019   Procedure: TOTAL KNEE ARTHROPLASTY;  Surgeon: Elly Habermann, MD;  Location: [facility];  Service: Orthopedics;  Laterality: Right;   TOTAL KNEE REVISION Right 05/12/2022  Procedure: Right knee polyethylene versus total knee arthroplasty revision;  Surgeon: Liliane Rei, MD;  Location: [facility];  Service: Orthopedics;  Laterality: Right;   TOTAL VAGINAL HYSTERECTOMY  1986 - or age 63   secondary AUB   TUBAL LIGATION     laparosopic BTL age 52   varicose veins Bilateral     is allergic to penicillins and sulfa antibiotics.   Family History  Problem Relation Age  of Onset   Coronary artery disease Mother 104   Hypertension Mother    Aortic aneurysm Father    Hypertension Father    Prostate cancer Father    Hyperlipidemia Brother        brain and kidney tumor, no treatment needed    Aneurysm Brother    Bladder Cancer Paternal Grandmother    Healthy Daughter    Healthy Daughter    Healthy Daughter    Uterine cancer Neg Hx     Social History   Tobacco Use   Smoking status: Never    Passive exposure: Past   Smokeless tobacco: Never  Vaping Use   Vaping status: Never Used  Substance Use Topics   Alcohol use: No    Alcohol/week: 0.0 standard drinks of alcohol   Drug use: No     Review of Systems was negative for a full 10 system review except as noted in the History of Present Illness.  No current facility-administered medications for this encounter.  Current Outpatient Medications:    Acetaminophen  (TYLENOL  PO), Take by mouth as needed., Disp: , Rfl:    acetaminophen  (TYLENOL ) 500 MG tablet, Take 1 tablet (500 mg total) by mouth every 6 (six) hours as needed (pain)., Disp: 30 tablet, Rfl: 0   amLODipine  (NORVASC ) 5 MG tablet, Take 5 mg by mouth daily., Disp: , Rfl:    benazepril (LOTENSIN) 40 MG tablet, Take 40 mg by mouth daily. , Disp: , Rfl:    Calcium Carb-Cholecalciferol (CALCIUM 600 + D PO), Take 1 tablet by mouth daily., Disp: , Rfl:    cyanocobalamin (VITAMIN B12) 1000 MCG tablet, Take 1,000 mcg by mouth daily., Disp: , Rfl:    estradiol  (ESTRACE ) 0.1 MG/GM vaginal cream, Place 0.5 g vaginally 2 (two) times a week. Place 0.5g nightly for two weeks then twice a week after, Disp: 30 g, Rfl: 3   ibandronate (BONIVA) 150 MG tablet, Take 150 mg by mouth every 30 (thirty) days. Take in the morning with a full glass of water , on an empty stomach, and do not take anything else by mouth or lie down for the next 30 min., Disp: , Rfl:    ibuprofen (ADVIL) 600 MG tablet, Take 1 tablet (600 mg total) by mouth every 6 (six) hours as needed.,  Disp: 30 tablet, Rfl: 0   levothyroxine  (SYNTHROID ) 125 MCG tablet, Take 125 mcg by mouth daily before breakfast., Disp: , Rfl:    Magnesium 250 MG TABS, Take 500 mg by mouth daily., Disp: , Rfl:    METAMUCIL FIBER PO, Take 6 capsules by mouth daily., Disp: , Rfl:    Multiple Vitamin (MULTIVITAMIN) tablet, Take 1 tablet by mouth daily., Disp: , Rfl:    Multiple Vitamins-Minerals (PRESERVISION AREDS PO), Take by mouth daily., Disp: , Rfl:    Naproxen Sodium (ALEVE PO), Take by mouth as needed., Disp: , Rfl:    oxyCODONE  (OXY IR/ROXICODONE ) 5 MG immediate release tablet, Take 1 tablet (5 mg total) by mouth every 4 (four) hours as needed for severe pain (  pain score 7-10)., Disp: 15 tablet, Rfl: 0   polyethylene glycol powder (GLYCOLAX /MIRALAX ) 17 GM/SCOOP powder, Take 17 g by mouth daily. Drink 17g (1 scoop) dissolved in water  per day., Disp: 255 g, Rfl: 0   Objective There were no vitals filed for this visit.   Gen: NAD CV: S1 S2 RRR Lungs: Clear to auscultation bilaterally Abd: soft, nontender   Previous Pelvic Exam showed: POP-Q   -1                                            Aa   -1                                           Ba   -7                                              C    3                                            Gh   4                                            Pb   7                                            tvl    -2                                            Ap   -2                                            Bp                                            Assessment/ Plan  Assessment: The patient is a 78 y.o. year old scheduled to undergo Exam under anesthesia, Colporrhaphy anterior for cystocele repair, colporrhaphy posterior for rectocele repair, cystoscopy with bladder biopsy and fulguration of bladder lesions. Verbal consent was obtained for these procedures.

## 2024-02-08 ENCOUNTER — Encounter (HOSPITAL_COMMUNITY): Payer: Self-pay | Admitting: Obstetrics

## 2024-02-08 NOTE — Progress Notes (Signed)
 Spoke w/ via phone for pre-op interview--- [name] Lab needs dos----    BMP per anesthesia     Lab results------ Current EKG in Epic dated 12/19/23. COVID test -----patient states asymptomatic no test needed Arrive at -------1100 NPO after MN NO Solid Food.  Clear liquids from MN until---1000 Pre-Surgery Ensure or G2:  Med rec completed Medications to take morning of surgery -----Norvasc  and Levothyroxine  Diabetic medication -----  GLP1 agonist last dose: GLP1 instructions:  Cardiac clearance in Epic Dr Lavonne Prairie dated 12/19/23.  Patient instructed no nail polish to be worn day of surgery Patient instructed to bring photo id and insurance card day of surgery Patient aware to have Driver (ride ) / caregiver    for 24 hours after surgery - Husband [name] or daughter [name] Patient Special Instructions ----- Shower with antibacterial soap. Pre-Op special Instructions -----  Patient verbalized understanding of instructions that were given at this phone interview. Patient denies chest pain, sob, fever, cough at the interview.

## 2024-02-15 ENCOUNTER — Encounter (HOSPITAL_COMMUNITY): Payer: Self-pay | Admitting: Obstetrics

## 2024-02-15 ENCOUNTER — Other Ambulatory Visit: Payer: Self-pay

## 2024-02-15 ENCOUNTER — Ambulatory Visit (HOSPITAL_COMMUNITY): Admitting: Anesthesiology

## 2024-02-15 ENCOUNTER — Encounter (HOSPITAL_COMMUNITY)
Admission: RE | Disposition: A | Discharge status: Home / Self Care | Payer: Self-pay | Source: Home / Self Care | Attending: Obstetrics

## 2024-02-15 ENCOUNTER — Telehealth: Payer: Self-pay | Admitting: Obstetrics

## 2024-02-15 ENCOUNTER — Ambulatory Visit (HOSPITAL_COMMUNITY)
Admission: RE | Admit: 2024-02-15 | Discharge: 2024-02-15 | Disposition: A | Discharge status: Home / Self Care | Attending: Obstetrics | Admitting: Obstetrics

## 2024-02-15 DIAGNOSIS — I350 Nonrheumatic aortic (valve) stenosis: Secondary | ICD-10-CM | POA: Diagnosis not present

## 2024-02-15 DIAGNOSIS — Z79899 Other long term (current) drug therapy: Secondary | ICD-10-CM | POA: Insufficient documentation

## 2024-02-15 DIAGNOSIS — N812 Incomplete uterovaginal prolapse: Secondary | ICD-10-CM

## 2024-02-15 DIAGNOSIS — I10 Essential (primary) hypertension: Secondary | ICD-10-CM | POA: Diagnosis not present

## 2024-02-15 DIAGNOSIS — Z01818 Encounter for other preprocedural examination: Secondary | ICD-10-CM

## 2024-02-15 DIAGNOSIS — Z87440 Personal history of urinary (tract) infections: Secondary | ICD-10-CM

## 2024-02-15 DIAGNOSIS — N329 Bladder disorder, unspecified: Secondary | ICD-10-CM | POA: Diagnosis not present

## 2024-02-15 DIAGNOSIS — M797 Fibromyalgia: Secondary | ICD-10-CM | POA: Diagnosis not present

## 2024-02-15 DIAGNOSIS — N8110 Cystocele, unspecified: Secondary | ICD-10-CM

## 2024-02-15 DIAGNOSIS — R339 Retention of urine, unspecified: Secondary | ICD-10-CM | POA: Diagnosis present

## 2024-02-15 DIAGNOSIS — N816 Rectocele: Secondary | ICD-10-CM | POA: Diagnosis not present

## 2024-02-15 DIAGNOSIS — N3289 Other specified disorders of bladder: Secondary | ICD-10-CM | POA: Diagnosis present

## 2024-02-15 DIAGNOSIS — M1990 Unspecified osteoarthritis, unspecified site: Secondary | ICD-10-CM | POA: Insufficient documentation

## 2024-02-15 DIAGNOSIS — R159 Full incontinence of feces: Secondary | ICD-10-CM | POA: Diagnosis not present

## 2024-02-15 DIAGNOSIS — Z8679 Personal history of other diseases of the circulatory system: Secondary | ICD-10-CM

## 2024-02-15 DIAGNOSIS — Z87448 Personal history of other diseases of urinary system: Secondary | ICD-10-CM

## 2024-02-15 DIAGNOSIS — G709 Myoneural disorder, unspecified: Secondary | ICD-10-CM | POA: Diagnosis not present

## 2024-02-15 DIAGNOSIS — N3031 Trigonitis with hematuria: Secondary | ICD-10-CM | POA: Diagnosis not present

## 2024-02-15 DIAGNOSIS — Z8249 Family history of ischemic heart disease and other diseases of the circulatory system: Secondary | ICD-10-CM | POA: Insufficient documentation

## 2024-02-15 DIAGNOSIS — E039 Hypothyroidism, unspecified: Secondary | ICD-10-CM | POA: Diagnosis not present

## 2024-02-15 HISTORY — PX: RECTOCELE REPAIR: SHX761

## 2024-02-15 HISTORY — PX: CYSTOCELE REPAIR: SHX163

## 2024-02-15 HISTORY — PX: CYSTOSCOPY WITH FULGERATION: SHX6638

## 2024-02-15 HISTORY — PX: EXAM UNDER ANESTHESIA, PELVIC: SHX7461

## 2024-02-15 LAB — BASIC METABOLIC PANEL WITH GFR
Anion gap: 10 (ref 5–15)
BUN: 15 mg/dL (ref 8–23)
CO2: 22 mmol/L (ref 22–32)
Calcium: 8.6 mg/dL — ABNORMAL LOW (ref 8.9–10.3)
Chloride: 109 mmol/L (ref 98–111)
Creatinine, Ser: 0.55 mg/dL (ref 0.44–1.00)
GFR, Estimated: >60 mL/min (ref >60)
Glucose, Bld: 94 mg/dL (ref 70–99)
Potassium: 3.9 mmol/L (ref 3.5–5.1)
Sodium: 141 mmol/L (ref 135–145)

## 2024-02-15 SURGERY — COLPORRHAPHY, ANTERIOR, FOR CYSTOCELE REPAIR
Anesthesia: General | Site: Vagina

## 2024-02-15 MED ORDER — STERILE WATER FOR IRRIGATION IR SOLN
Status: DC | PRN
  Administered 2024-02-15: 3000 mL

## 2024-02-15 MED ORDER — ENOXAPARIN SODIUM 40 MG/0.4ML IJ SOSY
40.0000 mg | PREFILLED_SYRINGE | INTRAMUSCULAR | Status: AC
  Administered 2024-02-15: 40 mg via SUBCUTANEOUS

## 2024-02-15 MED ORDER — CLINDAMYCIN PHOSPHATE 900 MG/50ML IV SOLN
INTRAVENOUS | Status: AC
  Filled 2024-02-15: qty 50

## 2024-02-15 MED ORDER — ACETAMINOPHEN 500 MG PO TABS
1000.0000 mg | ORAL_TABLET | ORAL | Status: AC
  Administered 2024-02-15: 1000 mg via ORAL

## 2024-02-15 MED ORDER — DEXAMETHASONE SODIUM PHOSPHATE 10 MG/ML IJ SOLN
INTRAMUSCULAR | Status: DC | PRN
  Administered 2024-02-15: 10 mg via INTRAVENOUS

## 2024-02-15 MED ORDER — CHLORHEXIDINE GLUCONATE 0.12 % MT SOLN
OROMUCOSAL | Status: AC
  Filled 2024-02-15: qty 15

## 2024-02-15 MED ORDER — LIDOCAINE 2% (20 MG/ML) 5 ML SYRINGE
INTRAMUSCULAR | Status: DC | PRN
  Administered 2024-02-15: 60 mg via INTRAVENOUS

## 2024-02-15 MED ORDER — SODIUM CHLORIDE 0.9 % IR SOLN
Status: DC | PRN
  Administered 2024-02-15: 1000 mL via INTRAVESICAL

## 2024-02-15 MED ORDER — 0.9 % SODIUM CHLORIDE (POUR BTL) OPTIME
TOPICAL | Status: DC | PRN
  Administered 2024-02-15: 1000 mL

## 2024-02-15 MED ORDER — SUGAMMADEX SODIUM 200 MG/2ML IV SOLN
INTRAVENOUS | Status: DC | PRN
  Administered 2024-02-15: 200 mg via INTRAVENOUS

## 2024-02-15 MED ORDER — FENTANYL CITRATE (PF) 250 MCG/5ML IJ SOLN
INTRAMUSCULAR | Status: AC
  Filled 2024-02-15: qty 5

## 2024-02-15 MED ORDER — PHENYLEPHRINE 80 MCG/ML (10ML) SYRINGE FOR IV PUSH (FOR BLOOD PRESSURE SUPPORT)
PREFILLED_SYRINGE | INTRAVENOUS | Status: DC | PRN
  Administered 2024-02-15 (×2): 80 ug via INTRAVENOUS

## 2024-02-15 MED ORDER — ORAL CARE MOUTH RINSE: 15.0000 mL | Freq: Once | OROMUCOSAL | Status: AC

## 2024-02-15 MED ORDER — ENOXAPARIN SODIUM 40 MG/0.4ML IJ SOSY
PREFILLED_SYRINGE | INTRAMUSCULAR | Status: AC
  Filled 2024-02-15: qty 0.4

## 2024-02-15 MED ORDER — ACETAMINOPHEN 500 MG PO TABS
ORAL_TABLET | ORAL | Status: AC
  Filled 2024-02-15: qty 2

## 2024-02-15 MED ORDER — FENTANYL CITRATE (PF) 100 MCG/2ML IJ SOLN: 25.0000 ug | INTRAMUSCULAR | Status: DC | PRN

## 2024-02-15 MED ORDER — EPHEDRINE SULFATE-NACL 50-0.9 MG/10ML-% IV SOSY
PREFILLED_SYRINGE | INTRAVENOUS | Status: DC | PRN
  Administered 2024-02-15 (×2): 5 mg via INTRAVENOUS
  Administered 2024-02-15: 10 mg via INTRAVENOUS
  Administered 2024-02-15: 5 mg via INTRAVENOUS

## 2024-02-15 MED ORDER — GABAPENTIN 300 MG PO CAPS
300.0000 mg | ORAL_CAPSULE | ORAL | Status: AC
  Administered 2024-02-15: 300 mg via ORAL

## 2024-02-15 MED ORDER — GABAPENTIN 300 MG PO CAPS
ORAL_CAPSULE | ORAL | Status: DC
Start: 2024-02-15 — End: 2024-02-15
  Filled 2024-02-15: qty 1

## 2024-02-15 MED ORDER — ONDANSETRON HCL 4 MG/2ML IJ SOLN
INTRAMUSCULAR | Status: DC | PRN
  Administered 2024-02-15: 4 mg via INTRAVENOUS

## 2024-02-15 MED ORDER — CLINDAMYCIN PHOSPHATE 900 MG/50ML IV SOLN
900.0000 mg | INTRAVENOUS | Status: AC
  Administered 2024-02-15: 900 mg via INTRAVENOUS

## 2024-02-15 MED ORDER — OXYCODONE HCL 5 MG PO TABS: 5.0000 mg | ORAL_TABLET | Freq: Once | ORAL | Status: DC | PRN

## 2024-02-15 MED ORDER — LIDOCAINE-EPINEPHRINE 1 %-1:100000 IJ SOLN
INTRAMUSCULAR | Status: DC | PRN
  Administered 2024-02-15: 28 mL

## 2024-02-15 MED ORDER — OXYCODONE HCL 5 MG/5ML PO SOLN: 5.0000 mg | Freq: Once | ORAL | Status: DC | PRN

## 2024-02-15 MED ORDER — CHLORHEXIDINE GLUCONATE 0.12 % MT SOLN
15.0000 mL | Freq: Once | OROMUCOSAL | Status: AC
  Administered 2024-02-15: 15 mL via OROMUCOSAL

## 2024-02-15 MED ORDER — DROPERIDOL 2.5 MG/ML IJ SOLN
0.6250 mg | Freq: Once | INTRAMUSCULAR | Status: DC | PRN
Start: 2024-02-15 — End: 2024-02-15

## 2024-02-15 MED ORDER — FENTANYL CITRATE (PF) 250 MCG/5ML IJ SOLN
INTRAMUSCULAR | Status: DC | PRN
  Administered 2024-02-15: 25 ug via INTRAVENOUS
  Administered 2024-02-15 (×2): 50 ug via INTRAVENOUS
  Administered 2024-02-15 (×2): 25 ug via INTRAVENOUS

## 2024-02-15 MED ORDER — LACTATED RINGERS IV SOLN: INTRAVENOUS | Status: DC

## 2024-02-15 MED ORDER — ACETAMINOPHEN 10 MG/ML IV SOLN: 1000.0000 mg | Freq: Once | INTRAVENOUS | Status: DC | PRN

## 2024-02-15 MED ORDER — ROCURONIUM BROMIDE 10 MG/ML (PF) SYRINGE
PREFILLED_SYRINGE | INTRAVENOUS | Status: DC | PRN
  Administered 2024-02-15: 5 mg via INTRAVENOUS
  Administered 2024-02-15: 50 mg via INTRAVENOUS
  Administered 2024-02-15: 20 mg via INTRAVENOUS

## 2024-02-15 MED ORDER — PHENAZOPYRIDINE HCL 100 MG PO TABS
200.0000 mg | ORAL_TABLET | ORAL | Status: AC
  Administered 2024-02-15: 200 mg via ORAL

## 2024-02-15 MED ORDER — PHENAZOPYRIDINE HCL 100 MG PO TABS
ORAL_TABLET | ORAL | Status: DC
Start: 2024-02-15 — End: 2024-02-15
  Filled 2024-02-15: qty 2

## 2024-02-15 MED ORDER — PROPOFOL 500 MG/50ML IV EMUL
INTRAVENOUS | Status: DC | PRN
Start: 2024-02-15 — End: 2024-02-15
  Administered 2024-02-15: 40 mg via INTRAVENOUS
  Administered 2024-02-15: 125 ug/kg/min via INTRAVENOUS
  Administered 2024-02-15: 30 mg via INTRAVENOUS
  Administered 2024-02-15: 20 mg via INTRAVENOUS
  Administered 2024-02-15: 120 mg via INTRAVENOUS

## 2024-02-15 MED ORDER — GENTAMICIN SULFATE 40 MG/ML IJ SOLN
5.0000 mg/kg | INTRAMUSCULAR | Status: AC
  Administered 2024-02-15: 410 mg via INTRAVENOUS
  Filled 2024-02-15: qty 10.25

## 2024-02-15 SURGICAL SUPPLY — 27 items
BLADE SURG 15 STRL LF DISP TIS (BLADE) ×4 IMPLANT
GLOVE BIOGEL PI IND STRL 6 (GLOVE) ×4 IMPLANT
GLOVE BIOGEL PI IND STRL 7.0 (GLOVE) ×4 IMPLANT
GLOVE BIOGEL PI IND STRL 7.5 (GLOVE) ×1 IMPLANT
GLOVE SS PI 5.5 STRL (GLOVE) ×8 IMPLANT
GLOVE SURG SS PI 7.0 STRL IVOR (GLOVE) ×1 IMPLANT
GOWN STRL REUS W/ TWL LRG LVL3 (GOWN DISPOSABLE) ×5 IMPLANT
GOWN STRL SURGICAL XL XLNG (GOWN DISPOSABLE) ×2 IMPLANT
HIBICLENS CHG 4% 4OZ BTL (MISCELLANEOUS) ×4 IMPLANT
HOLDER FOLEY CATH W/STRAP (MISCELLANEOUS) ×1 IMPLANT
IV NS 1000ML BAXH (IV SOLUTION) ×1 IMPLANT
KIT TURNOVER KIT B (KITS) ×4 IMPLANT
MANIFOLD NEPTUNE II (INSTRUMENTS) ×4 IMPLANT
NDL HYPO 22X1.5 SAFETY MO (MISCELLANEOUS) ×3 IMPLANT
NEEDLE HYPO 22X1.5 SAFETY MO (MISCELLANEOUS) ×4 IMPLANT
NS IRRIG 1000ML POUR BTL (IV SOLUTION) ×4 IMPLANT
PACK CYSTO (CUSTOM PROCEDURE TRAY) ×4 IMPLANT
PACK VAGINAL WOMENS (CUSTOM PROCEDURE TRAY) ×4 IMPLANT
RETRACTOR LONE STAR DISPOSABLE (INSTRUMENTS) ×4 IMPLANT
RETRACTOR STAY HOOK 5MM (MISCELLANEOUS) ×4 IMPLANT
SET CYSTO W/LG BORE CLAMP LF (SET/KITS/TRAYS/PACK) ×4 IMPLANT
SLEEVE SCD COMPRESS KNEE MED (STOCKING) ×4 IMPLANT
SPIKE FLUID TRANSFER (MISCELLANEOUS) ×1 IMPLANT
SUT PDS AB 2-0 CT2 27 (SUTURE) ×5 IMPLANT
SUT VIC AB 2-0 SH 27XBRD (SUTURE) ×5 IMPLANT
TOWEL GREEN STERILE FF (TOWEL DISPOSABLE) ×4 IMPLANT
WATER STERILE IRR 3000ML UROMA (IV SOLUTION) ×1 IMPLANT

## 2024-02-15 NOTE — Anesthesia Procedure Notes (Signed)
 Procedure Name: Intubation Date/Time: 02/15/2024 2:34 PM  Performed by: Viki Graver, CRNAPre-anesthesia Checklist: Patient identified, Emergency Drugs available, Suction available and Patient being monitored Patient Re-evaluated:Patient Re-evaluated prior to induction Oxygen  Delivery Method: Circle System Utilized Preoxygenation: Pre-oxygenation with 100% oxygen  Induction Type: IV induction Ventilation: Mask ventilation without difficulty Laryngoscope Size: Mac and 3 Grade View: Grade I Tube type: Oral Tube size: 7.0 mm Number of attempts: 1 Airway Equipment and Method: Stylet and Oral airway Placement Confirmation: ETT inserted through vocal cords under direct vision, positive ETCO2 and breath sounds checked- equal and bilateral Secured at: 21 cm Tube secured with: Tape Dental Injury: Teeth and Oropharynx as per pre-operative assessment

## 2024-02-15 NOTE — Interval H&P Note (Signed)
 History and Physical Interval Note:  02/15/2024 2:17 PM  [name]  has presented today for surgery, with the diagnosis of anterior vaginal prolpase; urinary retention; bladder distention; hematuria.  The various methods of treatment have been discussed with the patient and family. After consideration of risks, benefits and other options for treatment, the patient has consented to  Procedure(s) with comments: COLPORRHAPHY, ANTERIOR, FOR CYSTOCELE REPAIR (N/A) COLPORRHAPHY, POSTERIOR, FOR RECTOCELE REPAIR (N/A) CYSTOSCOPY (N/A) - cystoscopy with bladder biopsy EXAM UNDER ANESTHESIA, PELVIC as a surgical intervention.  The patient's history has been reviewed, patient examined, no change in status, stable for surgery.  I have reviewed the patient's chart and labs.  Questions were answered to the patient's satisfaction.     Ellary Casamento Edgardo Goodwill

## 2024-02-15 NOTE — Discharge Instructions (Signed)
 Call Dr Aron Lard, MD office in the morning. [phone] to arrange for appointment for voiding trial on Thursday, June 5th or Friday, June 6th in the MD office, DO NOT REMOVE the catheter until seen in the MD office

## 2024-02-15 NOTE — Op Note (Signed)
 Operative Note  Preoperative Diagnosis: Bladder ulcer, history of recurrent UTI, stage II pelvic organ prolapse, history of midurethral sling, fecal incontinence  Postoperative Diagnosis: Bladder ulcer, history of recurrent UTI, stage II pelvic organ prolapse, history of midurethral sling, fecal incontinence  Procedures performed:  Exam under anesthesia, anterior posterior repair, perineorrhaphy, cystoscopy with bladder biopsy and fulguration of bladder lesions   Implants: * No implants in log *  Attending Surgeon: Malinda Scurry, MD  Assistant Surgeon: n/a  Assistant: n/a  Anesthesia: General endotracheal  Findings: 1. On vaginal exam, stage II anterior and posterior vaginal prolapse present  2. On cystoscopy,  she had diminished urethral coaptation and normal urethral mucosa.  She had abnormal  trabeculated bladder mucosa with 2 ulcerations with white white scarring with radiating erythema noted at right bladder dome . Brisk bilateral ureteral efflux present.    Specimens:  ID Type Source Tests Collected by Time Destination  1 : bladder biopsy Tissue PATH GU biopsy SURGICAL PATHOLOGY Wyonia Hefty T, MD 02/15/2024 1539     Estimated blood loss: 50 mL  IV fluids: 1160 mL  Urine output: 350 mL  Complications: none  Procedure in Detail: After informed consent was obtained, the patient was taken to the operating room where general anesthesia was induced. She was placed in dorsal lithotomy position, taking care to avoid any traction of the extremities and prepped and draped in the usual sterile fashion. A self-retaining lonestar retractor was placed using four elastic blue stays.  After a foley catheter was inserted into the urethra, the location of the midurethra was palpated. Two Allis clamps were along the anterior vaginal wall defect. 1% lidocaine  with epinephrine  was injected into the vaginal mucosa.  A vertical incision was made between these two Allis clamps with a 15 blade scalpel.   Allis clamps were placed along this incision and Metzenbaum scissors were used to undermine the vaginal mucosa along the incision.  The vaginal mucosa was then sharply dissected off to the vesicovaginal septum bilaterally to the level of the pubic rami.  Anterior plication of the vesicovaginal septum was then performed using plicating sutures of 2-0 PDS in a continuous running fashion. The vaginal mucosal edges were trimmed and the incision reapproximated with 2-0 Vicryl in a running fashion.    The Foley catheter was removed.  A 70-degree cystoscope was introduced, and 360-degree inspection revealed no injury or foreign body in the bladder using sterile water  as distention fluid.  Good bilateral ureteral efflux was noted. She had abnormal  trabeculated bladder mucosa with 2 ulcerations with white white scarring with radiating erythema noted at right bladder dome. An endoscopic biopsy forceps were passed under direct visualization, specimen was sent to pathology. The ulcerated area was fulgurated with a Bugbee electrode under direct visualization to ensure hemostasis. Diminished urethral coaptation and normal urethral mucosa.  The bladder was drained and re-distended with hemostasis noted, the cystoscope was removed under direct visualization. The Foley catheter was reinserted.  Attention was then turned to the posterior vagina.  Two Allis clamps were placed in the midline of the posterior vaginal wall defect.  A lonestar self-retraining retractor was placed with 4 stay hooks. Two Allis clamps were placed at the introitus approximately 2.5cm from the urethra meatus. Local anesthetic was injected into the vaginal mucosa in the posterior vaginal wall and perineum for hydrodissection and hemostasis. A vertical incision was made between these clamps with a 15 blade scalpel and a diamond shaped area of epithelium was cut at  the introitus.  The rectovaginal septum was then dissected off the vaginal mucosa  bilaterally. The rectovaginal septum was then plicated in a continuous running fashion with 2-0 PDS while one finger was placed in the rectum to prevent rectal penetration.  After placement of the first plication stitch two fingers were inserted into the vaginal to confirm adequate caliber.  The suture incorporated the perineal body in a U stitch fashion and the bulbocavernosus muscles. A 2-0 Vicryl was used in a subcuticular fashion to re-approximate the hymenal ring. After plication, the excess vaginal mucosa was trimmed and the vaginal mucosa was reapproximated using 2-0 Vicryl sutures.  The vagina was copiously irrigated.  Hemostasis was noted. A rectal examination was normal and confirmed no sutures within the rectum. Three fingers passed through the vaginal opening without difficulty.  The patient tolerated the procedure well.  She was awakened from anesthesia and transferred to the recovery room in stable condition. All needle and sponge counts were correct x 2.

## 2024-02-15 NOTE — Anesthesia Preprocedure Evaluation (Addendum)
 Anesthesia Evaluation  Patient identified by MRN, date of birth, ID band Patient awake    Reviewed: Allergy & Precautions, NPO status , Patient's Chart, lab work & pertinent test results  History of Anesthesia Complications (+) PONV and history of anesthetic complications  Airway Mallampati: II  TM Distance: >3 FB Neck ROM: Full    Dental  (+) Caps, Implants, Dental Advisory Given   Pulmonary neg pulmonary ROS   breath sounds clear to auscultation       Cardiovascular hypertension, Pt. on medications  Rhythm:Regular Rate:Normal  Echo:  1. Left ventricular ejection fraction, by estimation, is 55 to 60%. The  left ventricle has normal function. The left ventricle has no regional  wall motion abnormalities. Left ventricular diastolic parameters are  consistent with Grade I diastolic  dysfunction (impaired relaxation).   2. Right ventricular systolic function is normal. The right ventricular  size is normal. There is normal pulmonary artery systolic pressure. The  estimated right ventricular systolic pressure is 24.7 mmHg.   3. The mitral valve is normal in structure. Trivial mitral valve  regurgitation. No evidence of mitral stenosis.   4. The aortic valve is tricuspid. There is moderate calcification of the  aortic valve. Aortic valve regurgitation is trivial. Mild aortic valve  stenosis. Aortic valve area, by VTI measures 1.54 cm. Aortic valve mean  gradient measures 13.0 mmHg.   5. The inferior vena cava is normal in size with greater than 50%  respiratory variability, suggesting right atrial pressure of 3 mmHg.     Neuro/Psych  Neuromuscular disease  negative psych ROS   GI/Hepatic negative GI ROS, Neg liver ROS,,,  Endo/Other  Hypothyroidism    Renal/GU negative Renal ROS     Musculoskeletal  (+) Arthritis ,  Fibromyalgia -  Abdominal   Peds  Hematology negative hematology ROS (+)   Anesthesia Other  Findings   Reproductive/Obstetrics                             Anesthesia Physical Anesthesia Plan  ASA: 2  Anesthesia Plan: General   Post-op Pain Management: Tylenol  PO (pre-op)* and Gabapentin  PO (pre-op)*   Induction: Intravenous  PONV Risk Score and Plan: 4 or greater and Ondansetron , Dexamethasone  and Treatment may vary due to age or medical condition  Airway Management Planned: Oral ETT  Additional Equipment: None  Intra-op Plan:   Post-operative Plan: Extubation in OR  Informed Consent: I have reviewed the patients History and Physical, chart, labs and discussed the procedure including the risks, benefits and alternatives for the proposed anesthesia with the patient or authorized representative who has indicated his/her understanding and acceptance.     Dental advisory given  Plan Discussed with: CRNA  Anesthesia Plan Comments:        Anesthesia Quick Evaluation

## 2024-02-15 NOTE — Transfer of Care (Addendum)
 Immediate Anesthesia Transfer of Care Note  Patient: [name]  Procedure(s) Performed: COLPORRHAPHY, ANTERIOR, FOR CYSTOCELE REPAIR (Vagina ) COLPORRHAPHY, POSTERIOR, FOR RECTOCELE REPAIR (Vagina ) EXAM UNDER ANESTHESIA, PELVIC (Vagina ) CYSTOSCOPY, WITH BLADDER BIOPSY AND FULGURATION (Bladder)  Patient Location: PACU  Anesthesia Type:General  Level of Consciousness: awake and oriented  Airway & Oxygen  Therapy: Patient Spontanous Breathing and Patient connected to face mask oxygen   Post-op Assessment: Report given to RN, Post -op Vital signs reviewed and stable, and Patient moving all extremities X 4  Post vital signs: Reviewed and stable  Last Vitals:  Vitals Value Taken Time  BP    Temp    Pulse 87 02/15/24 1652  Resp 14 02/15/24 1652  SpO2 97 % 02/15/24 1652  Vitals shown include unfiled device data.  Last Pain:  Vitals:   02/15/24 1122  TempSrc: Oral         Complications: No notable events documented.

## 2024-02-16 ENCOUNTER — Encounter (HOSPITAL_COMMUNITY): Payer: Self-pay | Admitting: Obstetrics

## 2024-02-16 NOTE — Telephone Encounter (Signed)
 [name]  underwent Colporrhaphy, Anterior, For Cystocele Repair, Colporrhaphy, Posterior, For Rectocele Repair, Exam Under Anesthesia, Pelvic, and Cystoscopy, With Bladder Biopsy And Fulguration  on 02/15/2024  with [] Dr Frutoso Jing [] Dr Aron Lard.  The patient reports that her pain is controlled.  She is taking [] No Medication [] Acetaminophen  500mg  every 6 hours [] Ibuprofen  600mg  every 6 hours or []  Prescribed Narcotic.  Her pain level is 5[x] with medication [] Without medication is a 8.   She reports vaginal bleeding spotting.  The patient is tolerating PO fluids and solids. She has not had a bowel movement and is taking Miralax  for a bowel regimen. She is not not passing gas.  She was discharged with a catheter.    [x] Discharged with a catheter, the patient is not having any concerns with her catheter.  She will return for a voiding trial. [x] Verified scheduled date and time with patient.  She does not have any additional questions.  Reviewed Post operative instructions as needed to answer additional questions.   CC'd note to patient's provider.

## 2024-02-16 NOTE — Anesthesia Postprocedure Evaluation (Signed)
 Anesthesia Post Note  Patient: [name]  Procedure(s) Performed: COLPORRHAPHY, ANTERIOR, FOR CYSTOCELE REPAIR (Vagina ) COLPORRHAPHY, POSTERIOR, FOR RECTOCELE REPAIR (Vagina ) EXAM UNDER ANESTHESIA, PELVIC (Vagina ) CYSTOSCOPY, WITH BLADDER BIOPSY AND FULGURATION (Bladder)     Patient location during evaluation: PACU Anesthesia Type: General Level of consciousness: sedated and patient cooperative Pain management: pain level controlled Vital Signs Assessment: post-procedure vital signs reviewed and stable Respiratory status: spontaneous breathing Cardiovascular status: stable Anesthetic complications: no   No notable events documented.  Last Vitals:  Vitals:   02/15/24 1739 02/15/24 1815  BP: 121/64   Pulse:    Resp: 14   Temp:    SpO2: 94% 92%    Last Pain:  Vitals:   02/15/24 1800  TempSrc:   PainSc: 0-No pain                 Gorman Laughter

## 2024-02-17 ENCOUNTER — Ambulatory Visit

## 2024-02-17 ENCOUNTER — Other Ambulatory Visit: Payer: Self-pay | Admitting: Obstetrics and Gynecology

## 2024-02-17 ENCOUNTER — Ambulatory Visit: Payer: Self-pay | Admitting: Obstetrics

## 2024-02-17 DIAGNOSIS — Z01818 Encounter for other preprocedural examination: Secondary | ICD-10-CM

## 2024-02-17 DIAGNOSIS — Z48816 Encounter for surgical aftercare following surgery on the genitourinary system: Secondary | ICD-10-CM

## 2024-02-17 LAB — SURGICAL PATHOLOGY

## 2024-02-17 NOTE — Progress Notes (Signed)
 [name] underwent Colporrhaphy, Anterior, For Cystocele Repair, Colporrhaphy, Posterior, For Rectocele Repair, Exam Under Anesthesia, Pelvic, and Cystoscopy, With Bladder Biopsy And Fulguration on 02/15/2024  She presents for a voiding trial.   Patient was identified with 2 identifiers.  The patient states she does not have any concerns with the foley placed.  300 mL of NS was instilled into the bladder via a catheter.  The catheter was removed and patient was instructed to void into the urinary hat.  She voided 200 mL, she did leak after placing 300 leak on the way to the bathroom.  The post void residual measured by bladder scan was 23 mL.  She did pass the voiding trial.  The patient was not sent home with a catheter.    I called the patient at 330. She has voided several times and has even had a BM.  The patient received aftercare instructions and will follow up as scheduled.

## 2024-02-17 NOTE — Patient Instructions (Signed)
 Please keep all scheduled follow ups.  It was a pleasure to see you today!  Thank you for trusting me with your care!

## 2024-03-26 ENCOUNTER — Other Ambulatory Visit (HOSPITAL_COMMUNITY)
Admission: RE | Admit: 2024-03-26 | Discharge: 2024-03-26 | Disposition: A | Discharge status: Home / Self Care | Source: Other Acute Inpatient Hospital | Attending: Obstetrics | Admitting: Obstetrics

## 2024-03-26 ENCOUNTER — Ambulatory Visit (INDEPENDENT_AMBULATORY_CARE_PROVIDER_SITE_OTHER): Admitting: Obstetrics

## 2024-03-26 ENCOUNTER — Encounter: Payer: Self-pay | Admitting: Obstetrics

## 2024-03-26 VITALS — BP 133/84 | HR 90

## 2024-03-26 DIAGNOSIS — R300 Dysuria: Secondary | ICD-10-CM

## 2024-03-26 DIAGNOSIS — Z48816 Encounter for surgical aftercare following surgery on the genitourinary system: Secondary | ICD-10-CM

## 2024-03-26 DIAGNOSIS — R82998 Other abnormal findings in urine: Secondary | ICD-10-CM | POA: Diagnosis present

## 2024-03-26 DIAGNOSIS — R351 Nocturia: Secondary | ICD-10-CM

## 2024-03-26 DIAGNOSIS — N3289 Other specified disorders of bladder: Secondary | ICD-10-CM

## 2024-03-26 DIAGNOSIS — N3946 Mixed incontinence: Secondary | ICD-10-CM | POA: Diagnosis not present

## 2024-03-26 DIAGNOSIS — Z87440 Personal history of urinary (tract) infections: Secondary | ICD-10-CM

## 2024-03-26 DIAGNOSIS — R339 Retention of urine, unspecified: Secondary | ICD-10-CM | POA: Insufficient documentation

## 2024-03-26 DIAGNOSIS — R159 Full incontinence of feces: Secondary | ICD-10-CM

## 2024-03-26 LAB — POCT URINALYSIS DIP (CLINITEK)
Bilirubin, UA: NEGATIVE
Glucose, UA: NEGATIVE mg/dL
Ketones, POC UA: NEGATIVE mg/dL
Nitrite, UA: POSITIVE — AB
POC PROTEIN,UA: 100 — AB
Spec Grav, UA: 1.025 (ref 1.010–1.025)
Urobilinogen, UA: 0.2 U/dL
pH, UA: 5.5 (ref 5.0–8.0)

## 2024-03-26 MED ORDER — GEMTESA 75 MG PO TABS: 75.0000 mg | ORAL_TABLET | Freq: Every day | ORAL | 2 refills | Status: DC

## 2024-03-26 MED ORDER — NITROFURANTOIN MONOHYD MACRO 100 MG PO CAPS: 100.0000 mg | ORAL_CAPSULE | Freq: Two times a day (BID) | ORAL | 0 refills | Status: AC

## 2024-03-26 MED ORDER — GEMTESA 75 MG PO TABS: 75.0000 mg | ORAL_TABLET | Freq: Every day | ORAL | Status: DC

## 2024-03-26 NOTE — Assessment & Plan Note (Addendum)
-   intermittent symptoms, UTI vs. IC in the setting of fibromyalgia - prior symptoms for 1 week with history of pansensitive E. Coli rUTI s/p Keflex  - denies fever, N/V, unilateral back pain, or hematuria - patient to call if symptoms worsens and Rx macrobid  - if no improvement of symptoms, consider IC treatments

## 2024-03-26 NOTE — Assessment & Plan Note (Addendum)
-   previously catheterized for , 70mL, and 50mL today - resumed low dose vaginal estrogen  - consider antibiotic suppression if symptoms improve after macrobid  until 3 months of vaginal estrogen - 11/09/23 POCT + leuk/protein/heme s/p Keflex  500mg  QID for presumed complicated UTI. UA + heme - For treatment of recurrent urinary tract infections, we discussed management of recurrent UTIs including prophylaxis with a daily low dose antibiotic, transvaginal estrogen therapy, D-mannose, and cranberry supplements.  We discussed the role of diagnostic testing such as cystoscopy and upper tract imaging.  - encouraged probiotics

## 2024-03-26 NOTE — Assessment & Plan Note (Signed)
-   resolved since surgery - Treatment options include anti-diarrhea medication (loperamide/ Imodium OTC or prescription lomotil), fiber supplements, physical therapy, and possible sacral neuromodulation or surgery.   - encouraged trial of fiber supplementation if symptoms return - s/p pelvic floor PT

## 2024-03-26 NOTE — Assessment & Plan Note (Addendum)
-   Urodynamics 10/05/23 with increased sensation, + SUI, and elevated PVR with dyssynergic EMG - s/p anterior colporrhaphy and bladder biopsy with fulguration of bladder ulcer 02/15/24 - repeat PVR 50mL by catheterization after anterior repair with sensation of improved bladder emptying - repeat at follow-up or if clinical change after Gemtesa

## 2024-03-26 NOTE — Assessment & Plan Note (Addendum)
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

## 2024-03-26 NOTE — Assessment & Plan Note (Signed)
-   baseline stress > urgency in the absence of UTI - history of [employer]c Midurethral sling by Dr. 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 Normal PVR 50mL today. - For treatment of stress urinary incontinence,  non-surgical options include expectant management, weight loss, physical therapy, as well as a pessary.  Surgical options include a midurethral sling, Burch urethropexy, and transurethral injection of a bulking agent. - reviewed potential office procedure with urethral bulking (Bulkamid) at the time of cystoscopy. We discussed success rate of approximately 70-80% and possible need for second injection. We reviewed that this is not a permanent procedure and the Bulkamid does dissolve over time. Risks reviewed including injury to bladder or urethra, UTI, urinary retention and hematuria.  - We discussed the symptoms of overactive bladder (OAB), which include urinary urgency, urinary frequency, nocturia, with or without urge incontinence.  While we do not know the exact etiology of OAB, several treatment options exist. We discussed management including behavioral therapy (decreasing bladder irritants, urge suppression strategies, timed voids, bladder retraining), physical therapy, medication; for refractory cases posterior tibial nerve stimulation, sacral neuromodulation, and intravesical botulinum toxin injection.  For anticholinergic medications, we discussed the potential side effects of anticholinergics including dry eyes, dry mouth, constipation, cognitive impairment and urinary retention. For Beta-3 agonist medication, we discussed the potential side effect of elevated blood pressure which is more likely to occur in individuals with uncontrolled hypertension. - symptoms previously improved after Keflex  for presumed UTI and vaginal estrogen, resumed vaginal estrogen 7 days postop - Rx  macrobid  for presumed UTI if symptoms return, recent improvement attributed to Amlodipine  discontinuation. Samples and Rx gemtesa  for trial if urinary symptoms return - 10/05/23: urodynamics showed urinary retention with SUI,valsalva voiding and dyssynergic EMG.  - s/p PFPT referral

## 2024-03-26 NOTE — Patient Instructions (Addendum)
 We discussed the symptoms of overactive bladder (OAB), which include urinary urgency, urinary frequency, night-time urination, with or without urge incontinence.  We discussed management including behavioral therapy (decreasing bladder irritants by following a bladder diet, urge suppression strategies, timed voids, bladder retraining), physical therapy, medication; and for refractory cases posterior tibial nerve stimulation, sacral neuromodulation, and intravesical botulinum toxin injection.   For Beta-3 agonist medication, we discussed the potential side effect of elevated blood pressure which is more likely to occur in individuals with uncontrolled hypertension. You were given samples for Gemtesa  75 mg.  It can take a month to start working so give it time, but if you have bothersome side effects call sooner and we can try a different medication.  Call us  if you have trouble filling the prescription or if it's not covered by your insurance.  Please stop the medication and call if you experience worsening urinary symptoms or sensation of incomplete emptying

## 2024-03-26 NOTE — Progress Notes (Signed)
 [facility]  Date of Visit: 03/26/2024  History of Present Illness: Ms. [name] is a 78 y.o. female scheduled today for a post-operative visit.   Surgery: s/p Exam under anesthesia, anterior posterior repair, perineorrhaphy, cystoscopy with bladder biopsy and fulguration of bladder lesions on 02/15/24  She passed her postoperative void trial.   Postoperative course was uncomplicated.   Today she reports improvement of urinary symptoms Night time frequency improved since stopping amlodipine  in the morning.   UTI in the last 6 weeks? No  Pain? With urination, denies blood in urine or UTI She has returned to her normal activity (except for postop restrictions), resumed vaginal estrogen Vaginal bulge? No  Stress incontinence: Yes , reduced since surgery with SUI 3x/day Urgency/frequency: Yes  Day time voids 5-6.  Nocturia: 1-2 times per night to void with insomnia. Same as baseline prior to surgery Urge incontinence: Yes 2-3x/day Voiding dysfunction: No , unsure however report sensation of voiding larger volumes and improved sensation Bowel issues: No  and resolution of bowel leakage  Subjective Success: Do you usually have a bulge or something falling out that you can see or feel in the vaginal area? No  Retreatment Success: Any retreatment with surgery or pessary for any compartment? No   Pathology results:  A. 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  - Small fragment of attached benign urothelium.  - No malignancy identified.   Medications: She has a current medication list which includes the following prescription(s): amlodipine , benazepril, calcium carb-cholecalciferol, cyanocobalamin, estradiol , ibandronate, levothyroxine , magnesium, meloxicam, fiber, multivitamin, multiple vitamins-minerals, nitrofurantoin  (macrocrystal-monohydrate), gemtesa , and gemtesa .   Allergies: Patient is allergic to penicillins and sulfa antibiotics.    Physical Exam: BP 133/84   Pulse 90   LMP 09/13/1984 (Approximate)   Abdomen: soft, non-tender, without masses or organomegaly Pelvic Examination: Vagina: Incisions healing well, no significant drainage, no dehiscence. Sutures are not present at incision line and there is not granulation tissue. No tenderness along the anterior or posterior vagina. No apical tenderness. No pelvic masses.   POP-Q: POP-Q  -2                                            Aa   -2                                           Ba  -6                                              C   2                                            Gh  4                                            Pb  6  tvl   -3                                            Ap  -3                                            Bp  -6                                              D   Straight Catheterization Procedure for PVR: After verbal consent was obtained from the patient for catheterization to assess bladder emptying and residual volume the urethra and surrounding tissues were prepped with betadine  and an in and out catheterization was performed.  PVR was 50mL.  Urine appeared malodorous clear yellow. The patient tolerated the procedure well.  Lab Results  Component Value Date   COLORU yellow 03/26/2024   CLARITYU clear 03/26/2024   GLUCOSEUR negative 03/26/2024   BILIRUBINUR negative 03/26/2024   KETONESU Negative 11/24/2023   SPECGRAV 1.025 03/26/2024   RBCUR moderate (A) 03/26/2024   PHUR 5.5 03/26/2024   PROTEINUR Negative 11/24/2023   UROBILINOGEN 0.2 03/26/2024   LEUKOCYTESUR Large (3+) (A) 03/26/2024    ---------------------------------------------------------  Assessment and Plan:  1. Bladder ulcer   2. Urinary incontinence, mixed   3. Dysuria   4. Incontinence of feces, unspecified fecal incontinence type   5. Nocturia   6. Incomplete emptying of bladder   7. 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  Dispense: 10 capsule; Refill: 0  Urinary incontinence, mixed Assessment & Plan: - baseline stress > urgency in the absence of UTI - history of Sparc Midurethral sling by Dr. 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 Normal PVR 50mL today. - For treatment of stress urinary incontinence,  non-surgical options include expectant management, weight loss, physical therapy, as well as a pessary.  Surgical options include a midurethral sling, Burch urethropexy, and transurethral injection of a bulking agent. - reviewed potential office procedure with urethral bulking (Bulkamid) at the time of cystoscopy. We discussed success rate of approximately 70-80% and possible need for second injection. We reviewed that this is not a permanent procedure and the Bulkamid does dissolve over time. Risks reviewed including injury to bladder or urethra, UTI, urinary retention and hematuria.  - We discussed the symptoms of overactive bladder (OAB), which include urinary urgency, urinary frequency, nocturia, with or without urge incontinence.  While we do not know the exact etiology of OAB, several treatment options exist. We discussed management including  behavioral therapy (decreasing bladder irritants, urge suppression strategies, timed voids,  bladder retraining), physical therapy, medication; for refractory cases posterior tibial nerve stimulation, sacral neuromodulation, and intravesical botulinum toxin injection.  For anticholinergic medications, we discussed the potential side effects of anticholinergics including dry eyes, dry mouth, constipation, cognitive impairment and urinary retention. For Beta-3 agonist medication, we discussed the potential side effect of elevated blood pressure which is more likely to occur in individuals with uncontrolled hypertension. - symptoms previously improved after Keflex  for presumed UTI and vaginal estrogen, resumed vaginal estrogen 7 days postop - Rx macrobid  for presumed UTI if symptoms return, recent improvement attributed to Amlodipine  discontinuation. Samples and Rx gemtesa  for trial if urinary symptoms return - 10/05/23: urodynamics showed urinary retention with SUI,valsalva voiding and dyssynergic EMG.  - s/p PFPT referral   Orders: -     Gemtesa ; Take 1 tablet (75 mg total) by mouth daily. LOT: [id number] EXP: 06/2026 -     Gemtesa ; Take 1 tablet (75 mg total) by mouth daily.  Dispense: 30 tablet; Refill: 2 -     POCT URINALYSIS DIP (CLINITEK)  Dysuria Assessment & Plan: - intermittent symptoms, UTI vs. IC in the setting of fibromyalgia - prior symptoms for 1 week with history of pansensitive E. Coli rUTI s/p Keflex  - denies fever, N/V, unilateral back pain, or hematuria - patient to call if symptoms worsens and Rx macrobid  - if no improvement of symptoms, consider IC treatments  Orders: -     Nitrofurantoin  Monohyd Macro; Take 1 capsule (100 mg total) by mouth 2 (two) times daily for 5 days.  Dispense: 10 capsule; Refill: 0 -     POCT URINALYSIS DIP (CLINITEK) -     Urine Culture; Future  Incontinence of feces, unspecified fecal incontinence type Assessment & Plan: - resolved since surgery - Treatment options include anti-diarrhea medication (loperamide/ Imodium OTC or prescription  lomotil), fiber supplements, physical therapy, and possible sacral neuromodulation or surgery.   - encouraged trial of fiber supplementation if symptoms return - s/p pelvic floor PT    Nocturia Assessment & Plan: - avoid fluid intake after 6pm - denies lower extremity swelling - reports snoring, sleep study to r/o sleep apnea - trial of Gemtesa    Incomplete emptying of bladder Assessment & Plan: - Urodynamics 10/05/23 with increased sensation, + SUI, and elevated PVR with dyssynergic EMG - s/p anterior colporrhaphy and bladder biopsy with fulguration of bladder ulcer 02/15/24 - repeat PVR 50mL by catheterization after anterior repair with sensation of improved bladder emptying - repeat at follow-up or if clinical change after Gemtesa    History of recurrent UTI (urinary tract infection) Assessment & Plan: - previously catheterized for , 70mL, and 50mL today - resumed low dose vaginal estrogen  - consider antibiotic suppression if symptoms improve after macrobid  until 3 months of vaginal estrogen - 11/09/23 POCT + leuk/protein/heme s/p Keflex  500mg  QID for presumed complicated UTI. UA + heme - For treatment of recurrent urinary tract infections, we discussed management of recurrent UTIs including prophylaxis with a daily low dose antibiotic, transvaginal estrogen therapy, D-mannose, and cranberry supplements.  We discussed the role of diagnostic testing such as cystoscopy and upper tract imaging.  - encouraged probiotics   - Can resume regular activity including exercise and intercourse,  if desired.  - Discussed avoidance of heavy lifting and straining long term to reduce the risk of recurrence.   All questions answered.   Return in about 4 weeks (around 04/23/2024).

## 2024-03-26 NOTE — Assessment & Plan Note (Signed)
-   avoid fluid intake after 6pm - denies lower extremity swelling - reports snoring, sleep study to r/o sleep apnea - trial of Gemtesa

## 2024-03-28 ENCOUNTER — Ambulatory Visit: Payer: Self-pay | Admitting: Obstetrics

## 2024-03-28 LAB — URINE CULTURE: Culture: >=100000 — AB

## 2024-04-30 ENCOUNTER — Encounter: Payer: Self-pay | Admitting: Obstetrics

## 2024-04-30 ENCOUNTER — Ambulatory Visit: Admitting: Obstetrics

## 2024-04-30 VITALS — BP 127/83 | HR 71

## 2024-04-30 DIAGNOSIS — N952 Postmenopausal atrophic vaginitis: Secondary | ICD-10-CM

## 2024-04-30 DIAGNOSIS — N3946 Mixed incontinence: Secondary | ICD-10-CM

## 2024-04-30 MED ORDER — TROSPIUM CHLORIDE 20 MG PO TABS: 20.0000 mg | ORAL_TABLET | Freq: Two times a day (BID) | ORAL | 2 refills | Status: DC

## 2024-04-30 NOTE — Assessment & Plan Note (Addendum)
-   For symptomatic vaginal atrophy options include lubrication with a water -based lubricant, personal hygiene measures and barrier protection against wetness, and estrogen replacement in the form of vaginal cream, vaginal tablets, or a time-released vaginal ring.   - continue low dose vaginal estrogen 2-3x/week - no bleeding on vaginal estrogen, s/p hysterectomy

## 2024-04-30 NOTE — Progress Notes (Signed)
 [facility] Return Visit  SUBJECTIVE  History of Present Illness: HORACE WISHON is a 78 y.o. female seen in follow-up for mixed urinary incontinence, history of rUTI and midurethral sling, fecal incontinence, nocturia, left ovarian cyst, and vaginal atrophy. Plan at last visit was trial of Gemtesa  and resume low dose vaginal estrogen.    Surgery: s/p Exam under anesthesia, anterior posterior repair, perineorrhaphy, cystoscopy with bladder biopsy and fulguration of bladder lesions on 02/15/24   Reports minimal spotting (quarter size/thin line) managed with 1 panty liner/day, continues to report improvement of urinary symptoms since surgery. 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   Past Surgical History: She  has a past surgical history that includes Ganglion cyst excision (Right); Thumb surgery (2/13 & ?2010); Incontinence surgery (07/05/2006); Cystocele repair (07/05/2006); Cysto (07/05/2006); Strabismus surgery (Right, 01/26/2008); Anterior lat lumbar fusion (10/08/2008); Back surgery; Total vaginal hysterectomy (1986 - or age 49); Foot surgery (Right); ORIF forearm fracture (Right, 09/2012); Shoulder  arthroscopy with rotator cuff repair and subacromial decompression (Right, 08/02/2013); Colonoscopy w/ biopsies (11/2009); Colonoscopy w/ biopsies (11/2012); Esophagogastroduodenoscopy endoscopy (05/25/2005); Back surgery (10/03/2014); varicose veins (Bilateral); Strabismus surgery (Right, 06/03/2017); Tubal ligation; Eye surgery (Right); Total knee arthroplasty (Right, 02/12/2019); Total knee revision (Right, 05/12/2022); Cataract extraction, bilateral; Cystocele repair (N/A, 02/15/2024); Rectocele repair (N/A, 02/15/2024); Exam under anesthesia, pelvic (02/15/2024); and Cystoscopy with fulgeration (02/15/2024).   Medications: She has a current medication list which includes the following prescription(s): trospium , amlodipine , benazepril, calcium carb-cholecalciferol, cyanocobalamin, estradiol , ibandronate, levothyroxine , magnesium, meloxicam, fiber, multivitamin, and multiple vitamins-minerals.   Allergies: Patient is allergic to penicillins and sulfa antibiotics.   Social History: Patient  reports that she has never smoked. She has been exposed to tobacco smoke. She has never used smokeless tobacco. She reports that she does not drink alcohol and does not use drugs.     OBJECTIVE     Physical Exam: Vitals:   04/30/24 1236  BP: 127/83  Pulse: 71   Gen: No apparent distress, A&O x 3.  Physical Exam Constitutional:      General: She is not in acute distress.    Appearance: Normal appearance.  Genitourinary:     Bladder and urethral meatus normal.     No lesions in the vagina.     Right Labia: No rash, tenderness, lesions, skin changes or Bartholin's cyst.    Left Labia: No tenderness, lesions, skin changes, Bartholin's cyst or rash.    No labial fusion noted.     No vaginal discharge, erythema, tenderness, bleeding, ulceration, granulation tissue or cuff induration.     No vaginal prolapse present.    Mild vaginal atrophy present.    Cervix is not absent.     Uterus is not absent.     Urethral meatus caruncle not present.    No urethral prolapse, tenderness, mass, hypermobility, discharge or stress urinary incontinence with cough stress test  present.     Bladder is not tender, urgency on palpation not present and masses not present.   Cardiovascular:     Rate and Rhythm: Normal rate.  Pulmonary:     Effort: No respiratory distress.  Neurological:     Mental Status: She is alert.  Vitals reviewed. Exam conducted with a chaperone present.        No data to display             ASSESSMENT AND PLAN    Ms. Johal is a 78 y.o. with:  1. Urinary incontinence, mixed   2. Vaginal atrophy     Urinary incontinence, mixed Assessment & Plan: - baseline stress > urgency in the absence of UTI, now urgency more bothersome due to large volume leakage. Decreased in frequency since surgery - history of Sparc Midurethral sling by Dr. 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 Prior PVR 50mL  - For treatment of stress urinary incontinence,  non-surgical options include expectant management, weight loss, physical therapy, as well as a pessary.  Surgical options include a midurethral sling, Burch urethropexy, and transurethral injection of a bulking agent. - reviewed potential office procedure with urethral bulking (Bulkamid) at the time of cystoscopy. We discussed success rate of approximately 70-80% and possible need for second injection. We reviewed that this is not a permanent procedure and the Bulkamid does dissolve over time. Risks reviewed including injury to bladder or urethra, UTI, urinary retention and hematuria.  - continue low dose vaginal estrogen, declines additional treatments for SUI at this time due to improvement of symptoms. - We discussed the symptoms of overactive bladder (OAB), which include urinary urgency, urinary frequency, nocturia, with or  without urge incontinence.  While we do not know the exact etiology of OAB, several treatment options exist. We discussed management including behavioral therapy (decreasing bladder irritants, urge suppression strategies, timed voids, bladder retraining), physical therapy, medication; for refractory cases posterior tibial nerve stimulation, sacral neuromodulation, and intravesical botulinum toxin injection.  For anticholinergic medications, we discussed the potential side effects of anticholinergics including dry eyes, dry mouth, constipation, cognitive impairment and urinary retention. For Beta-3 agonist medication, we discussed the potential side effect of elevated blood pressure which is more likely to occur in individuals with uncontrolled hypertension. - symptoms previously improved after Keflex  for presumed UTI and vaginal estrogen - Rx macrobid  for presumed UTI if symptoms return, recent improvement attributed to Amlodipine  discontinuation.  - encouraged to start trial of samples for gemtesa  from last visit (cost prohibitive), Rx Trospium  20mg  BID to reassess cost. Discussed [facility] if cost prohibitive - 10/05/23: urodynamics preop showed urinary retention with SUI, valsalva voiding and dyssynergic EMG.  - s/p PFPT referral  - encouraged pt to assess symptoms and consider 3rd line therapy if bothersome  Orders: -     Trospium  Chloride; Take 1 tablet (20 mg total) by mouth 2 (two) times daily.  Dispense: 60 tablet; Refill: 2  Vaginal atrophy Assessment & Plan: - For symptomatic vaginal atrophy options include lubrication with a water -based lubricant, personal hygiene measures and barrier protection against wetness, and estrogen replacement in the form of vaginal cream, vaginal tablets, or a time-released vaginal ring.   - continue low dose vaginal estrogen 2-3x/week - no bleeding on vaginal estrogen, s/p hysterectomy   Time spent: I spent 25 minutes dedicated to the care of this  patient  on the date of this encounter to include pre-visit review of records, face-to-face time with the patient discussing mixed urinary incontinence, vaginal atrophy, and post visit documentation and ordering medication/ testing.    Lianne ONEIDA Gillis, MD

## 2024-04-30 NOTE — Patient Instructions (Signed)
 We discussed the symptoms of overactive bladder (OAB), which include urinary urgency, urinary frequency, night-time urination, with or without urge incontinence.  We discussed management including behavioral therapy (decreasing bladder irritants by following a bladder diet, urge suppression strategies, timed voids, bladder retraining), physical therapy, medication; and for refractory cases posterior tibial nerve stimulation, sacral neuromodulation, and intravesical botulinum toxin injection.   For Beta-3 agonist medication, we discussed the potential side effect of elevated blood pressure which is more likely to occur in individuals with uncontrolled hypertension. You were given samples for Gemtesa  75mg . Start and monitor for changes in urinary symptoms  For anticholinergic medications, we discussed the potential side effects of anticholinergics including dry eyes, dry mouth, constipation, rare risks of cognitive impairment and urinary retention. You were given prescription for Trospium  20mg  twice a day.  It can take a month to start working so give it time, but if you have bothersome side effects call sooner and we can try a different medication.  Call us  if you have trouble filling the prescription or if it's not covered by your insurance.  Please visit the website below to sign up for an account. We will need an email address to send along with your prescription to verify your prescription once you have signed up.  [email or web address]  Trospium  Chloride ER Capsule Extended Release  60mg   30 count $31.11  Or    Trospium  Chloride (2 times a day dosing) Tablet  20mg   60 count  $14.03  Continue vaginal estrogen 1g up to 3 times a week and Kegel exercises.  Follow-up in 3 months to see how your symptoms are responding to treatment.

## 2024-04-30 NOTE — Assessment & Plan Note (Signed)
-   baseline stress > urgency in the absence of UTI, now urgency more bothersome due to large volume leakage. Decreased in frequency since surgery - history of Sparc Midurethral sling by Dr. 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 Prior PVR 50mL  - For treatment of stress urinary incontinence,  non-surgical options include expectant management, weight loss, physical therapy, as well as a pessary.  Surgical options include a midurethral sling, Burch urethropexy, and transurethral injection of a bulking agent. - reviewed potential office procedure with urethral bulking (Bulkamid) at the time of cystoscopy. We discussed success rate of approximately 70-80% and possible need for second injection. We reviewed that this is not a permanent procedure and the Bulkamid does dissolve over time. Risks reviewed including injury to bladder or urethra, UTI, urinary retention and hematuria.  - continue low dose vaginal estrogen, declines additional treatments for SUI at this time due to improvement of symptoms. - We discussed the symptoms of overactive bladder (OAB), which include urinary urgency, urinary frequency, nocturia, with or without urge incontinence.  While we do not know the exact etiology of OAB, several treatment options exist. We discussed management including behavioral therapy (decreasing bladder irritants, urge suppression strategies, timed voids, bladder retraining), physical therapy, medication; for refractory cases posterior tibial nerve stimulation, sacral neuromodulation, and intravesical botulinum toxin injection.  For anticholinergic medications, we discussed the potential side effects of anticholinergics including dry eyes, dry mouth, constipation, cognitive impairment and urinary retention. For Beta-3 agonist medication, we discussed the potential side effect of elevated blood  pressure which is more likely to occur in individuals with uncontrolled hypertension. - symptoms previously improved after Keflex  for presumed UTI and vaginal estrogen - Rx macrobid  for presumed UTI if symptoms return, recent improvement attributed to Amlodipine  discontinuation.  - encouraged to start trial of samples for gemtesa  from last visit (cost prohibitive), Rx Trospium  20mg  BID to reassess cost. [facility]iscussed [facility] if cost [facility]itive - 10/05/23: urodynamics preop showed urinary retention with SUI, valsalva voiding and dyssynergic EMG.  - s/p PFPT referral  - encouraged pt to assess symptoms and consider 3rd line therapy if bothersome

## 2024-05-01 ENCOUNTER — Encounter: Payer: Self-pay | Admitting: Obstetrics

## 2024-05-18 ENCOUNTER — Other Ambulatory Visit (HOSPITAL_COMMUNITY): Payer: Self-pay | Admitting: Student

## 2024-05-18 DIAGNOSIS — Z96651 Presence of right artificial knee joint: Secondary | ICD-10-CM

## 2024-05-25 ENCOUNTER — Encounter (HOSPITAL_COMMUNITY): Payer: Self-pay

## 2024-05-25 ENCOUNTER — Encounter (HOSPITAL_COMMUNITY)
Admission: RE | Admit: 2024-05-25 | Discharge: 2024-05-25 | Disposition: A | Discharge status: Home / Self Care | Source: Ambulatory Visit | Attending: Student | Admitting: Student

## 2024-05-25 DIAGNOSIS — Z96651 Presence of right artificial knee joint: Secondary | ICD-10-CM | POA: Diagnosis present

## 2024-05-25 MED ORDER — TECHNETIUM TC 99M MEDRONATE IV KIT
21.3000 | PACK | Freq: Once | INTRAVENOUS | Status: AC
  Administered 2024-05-25: 21.3 via INTRAVENOUS

## 2024-05-31 NOTE — Progress Notes (Deleted)
 Office Visit Note  Patient: [name]             Date of Birth: 05/06/1946           MRN: [id number]             PCP: Loring Tanda Mae, MD Referring: Loring Tanda Mae, * Visit Date: 06/14/2024 Occupation: Data Unavailable  Subjective:  No chief complaint on file.   History of Present Illness: [name] is a 78 y.o. female ***     Activities of Daily Living:  Patient reports morning stiffness for *** {minute/hour:19697}.   Patient {ACTIONS;DENIES/REPORTS:21021675::Denies} nocturnal pain.  Difficulty dressing/grooming: {ACTIONS;DENIES/REPORTS:21021675::Denies} Difficulty climbing stairs: {ACTIONS;DENIES/REPORTS:21021675::Denies} Difficulty getting out of chair: {ACTIONS;DENIES/REPORTS:21021675::Denies} Difficulty using hands for taps, buttons, cutlery, and/or writing: {ACTIONS;DENIES/REPORTS:21021675::Denies}  No Rheumatology ROS completed.   PMFS History:  Patient Active Problem List   Diagnosis Date Noted   Incomplete emptying of bladder 03/26/2024   Bladder ulcer 02/15/2024   Loud snoring 11/10/2023   Heart palpitations 11/10/2023   Diaphoresis 11/10/2023   Sleep related leg cramps 11/10/2023   Nonrheumatic aortic valve stenosis 11/10/2023   Fitting and adjustment of pessary 10/12/2023   Microscopic hematuria 10/12/2023   History of recurrent UTI (urinary tract infection) 09/01/2023   History of pelvic surgery 09/01/2023   Urinary incontinence, mixed 09/01/2023   Dysuria 09/01/2023   Nocturia 09/01/2023   Vaginal atrophy 09/01/2023   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   Precordial chest pain 12/31/2021   Primary localized osteoarthritis of right knee 10/19/2018   Right foot pain 10/19/2018   Incontinence of feces 09/21/2018   Vulvar lesion 07/20/2018   History of repair of right rotator cuff 03/03/2017   Primary osteoarthritis of both hands/ has had  bilateral CMC surgery  03/03/2017   History of hypertension 03/03/2017   History of hyperlipidemia 03/03/2017   DDD (degenerative disc disease), lumbar/ history of lumbar surgery  03/03/2017   Osteopenia of multiple sites 03/03/2017   Chronic left shoulder pain 01/20/2017   Primary osteoarthritis of both knees 01/20/2017   Trapezius muscle spasm 01/20/2017   Insomnia 01/20/2017   Fatigue 01/20/2017   Fibromyalgia 01/20/2017   High risk medications (not anticoagulants) long-term use 01/20/2017   PMR (polymyalgia rheumatica) (HCC) 01/20/2017   Left ovarian cyst 06/02/2015   Degenerative joint disease, shoulder, right 08/02/2013   Precordial pain 02/11/2012   HTN (hypertension) 02/11/2012   Hyperlipidemia 02/11/2012    Past Medical History:  Diagnosis Date   Bilateral ovarian cysts 09/12/2014   bilateral simple ovarian cysts.  US  due in January 2018.   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 x 20 yr.   Hyperlipidemia    Hypothyroid    IBS (irritable bowel syndrome)    Impingement syndrome of right shoulder 07/2013   Osteoporosis    Polymyalgia rheumatica (HCC)    5-28  states she has been on prednisone  and now is trying to only be on plaquenil  to manage the polymyalgia    PONV (postoperative nausea and vomiting)    Primary localized osteoarthritis of right knee    Rupture of right rotator cuff 07/2013   VIN I (vulvar intraepithelial neoplasia I) 2017, 2018   this is from a perineal/vulvar biopsy.   Vitamin B12 deficiency     Family History  Problem Relation Age of  Onset   Coronary artery disease Mother 64   Hypertension Mother    Aortic aneurysm Father    Hypertension Father    Prostate cancer Father    Hyperlipidemia Brother        brain and kidney tumor, no treatment needed    Aneurysm Brother    Bladder Cancer Paternal Grandmother    Healthy Daughter    Healthy  Daughter    Healthy Daughter    Uterine cancer Neg Hx    Past Surgical History:  Procedure Laterality Date   ANTERIOR LAT LUMBAR FUSION  10/08/2008   L3-4   BACK SURGERY     BACK SURGERY  10/03/2014   --Dr. [facility]il Carwin at [facility]   CATARACT EXTRACTION, BILATERAL     03/2023, 04/2023   COLONOSCOPY W/ BIOPSIES  11/2009   polyps   COLONOSCOPY W/ BIOPSIES  11/2012   polyp, recheck in 3 years   CYSTO  07/05/2006   CYSTOCELE REPAIR  07/05/2006   CYSTOCELE REPAIR N/A 02/15/2024   Procedure: COLPORRHAPHY, ANTERIOR, FOR CYSTOCELE REPAIR;  Surgeon: Guadlupe Lianne DASEN, MD;  Location[facility];  Service: Gynecology;  Laterality: N/A;   CYSTOSCOPY WITH FULGERATION  02/15/2024   Procedure: CYSTOSCOPY, WITH BLADDER BIOPSY AND FULGURATION;  Surgeon: Guadlupe Lianne DASEN, MD;  Location: [facility] OR;  Service: Gynecology;;   ESOPHAGOGASTRODUODENOSCOPY ENDOSCOPY  05/25/2005   erythema and edema of gastric lumen   EXAM UNDER ANESTHESIA, PELVIC  02/15/2024   Procedure: EXAM UNDER ANESTHESIA, PELVIC;  Surgeon: Guadlupe Lianne DASEN, MD;  Location: [facility] OR;  Service: Gynecology;;   EYE SURGERY Right    muscle adjustment    FOOT SURGERY Right    GANGLION CYST EXCISION Right    wrist   INCONTINENCE SURGERY  07/05/2006   cystourethropexy   ORIF FOREARM FRACTURE Right 09/2012   RECTOCELE REPAIR N/A 02/15/2024   Procedure: COLPORRHAPHY, POSTERIOR, FOR RECTOCELE REPAIR;  Surgeon: Guadlupe Lianne DASEN, MD;  Location: [facility] OR;  Service: Gynecology;  Laterality: N/A;   SHOULDER ARTHROSCOPY WITH ROTATOR CUFF REPAIR AND SUBACROMIAL DECOMPRESSION Right 08/02/2013   Procedure: SHOULDER ARTHROSCOPY WITH ROTATOR CUFF REPAIR AND SUBACROMIAL DECOMPRESSION, EXTENSIVE DEBRIDEMENT (INCLUDING LABRUM), PARTIAL ACROMIPLASY WITH CORACOACROMIAL RELEASE, DISTAL CLAVICULECTOMY;  Surgeon: Toribio JULIANNA Chancy, MD;  Location: [facility];  Service: Orthopedics;  Laterality: Right;   STRABISMUS SURGERY Right 01/26/2008   2 other surgeries previously   STRABISMUS  SURGERY Right 06/03/2017   Procedure: REPAIR STRABISMUS RIGHT EYE;  Surgeon: Neysa Fallow, MD;  Location: [facility];  Service: Ophthalmology;  Laterality: Right;   Thumb surgery  2/13 & ?2010   Bilateral - states a joint was replaced   TOTAL KNEE ARTHROPLASTY Right 02/12/2019   Procedure: TOTAL KNEE ARTHROPLASTY;  Surgeon: Jane Charleston, MD;  Location: [facility];  Service: Orthopedics;  Laterality: Right;   TOTAL KNEE REVISION Right 05/12/2022   Procedure: Right knee polyethylene versus total knee arthroplasty revision;  Surgeon: Melodi Lerner, MD;  Location: [facility];  Service: Orthopedics;  Laterality: Right;   TOTAL VAGINAL HYSTERECTOMY  75 - or age 89   secondary AUB   TUBAL LIGATION     laparosopic BTL age 71   varicose veins Bilateral    Social History   Tobacco Use   Smoking status: Never    Passive exposure: Past   Smokeless tobacco: Never  Vaping Use   Vaping status: Never Used  Substance Use Topics   Alcohol use: No    Alcohol/week: 0.0 standard  drinks of alcohol   Drug use: No   Social History   Social History Narrative   Lives at home with husband . One story home. Right handed     Immunization History  Administered Date(s) Administered   Influenza-Unspecified 08/04/2017   PFIZER(Purple Top)SARS-COV-2 Vaccination 11/15/2019, 12/12/2019, 06/16/2020   Pneumococcal Conjugate-13 05/14/2014, 05/15/2015   Tdap 09/13/2012, 10/19/2022     Objective: Vital Signs: LMP 09/13/1984 (Approximate)    Physical Exam   Musculoskeletal Exam: ***  CDAI Exam: CDAI Score: -- Patient Global: --; Provider Global: -- Swollen: --; Tender: -- Joint Exam 06/14/2024   No joint exam has been documented for this visit   There is currently no information documented on the homunculus. Go to the Rheumatology activity and complete the homunculus joint exam.  Investigation: No additional findings.  Imaging: No results found.  Recent Labs: Lab Results   Component Value Date   WBC 13.8 (H) 05/13/2022   HGB 10.9 (L) 05/13/2022   PLT 403 (H) 05/13/2022   NA 141 02/15/2024   K 3.9 02/15/2024   CL 109 02/15/2024   CO2 22 02/15/2024   GLUCOSE 94 02/15/2024   BUN 15 02/15/2024   CREATININE 0.55 02/15/2024   BILITOT 0.2 06/30/2021   ALKPHOS 73 02/08/2019   AST 20 06/30/2021   ALT 15 06/30/2021   PROT 7.1 06/30/2021   ALBUMIN 4.1 02/08/2019   CALCIUM 8.6 (L) 02/15/2024   GFRAA 103 10/27/2020    Speciality Comments: PLQ Eye Exam: 04/07/2020 WNL @ [facility] Follow up in 1 year  Procedures:  No procedures performed Allergies: Penicillins and Sulfa antibiotics   Assessment / Plan:     Visit Diagnoses: No diagnosis found.  Orders: No orders of the defined types were placed in this encounter.  No orders of the defined types were placed in this encounter.   Face-to-face time spent with patient was *** minutes. Greater than 50% of time was spent in counseling and coordination of care.  Follow-Up Instructions: No follow-ups on file.   Daved JAYSON Gavel, CMA  Note - This record has been created using Animal nutritionist.  Chart creation errors have been sought, but may not always  have been located. Such creation errors do not reflect on  the standard of medical care.

## 2024-06-14 ENCOUNTER — Ambulatory Visit: Admitting: Rheumatology

## 2024-06-14 DIAGNOSIS — M353 Polymyalgia rheumatica: Secondary | ICD-10-CM

## 2024-06-14 DIAGNOSIS — G4709 Other insomnia: Secondary | ICD-10-CM

## 2024-06-14 DIAGNOSIS — M19041 Primary osteoarthritis, right hand: Secondary | ICD-10-CM

## 2024-06-14 DIAGNOSIS — M797 Fibromyalgia: Secondary | ICD-10-CM

## 2024-06-14 DIAGNOSIS — M8589 Other specified disorders of bone density and structure, multiple sites: Secondary | ICD-10-CM

## 2024-06-14 DIAGNOSIS — Z9889 Other specified postprocedural states: Secondary | ICD-10-CM

## 2024-06-14 DIAGNOSIS — M1712 Unilateral primary osteoarthritis, left knee: Secondary | ICD-10-CM

## 2024-06-14 DIAGNOSIS — Z8679 Personal history of other diseases of the circulatory system: Secondary | ICD-10-CM

## 2024-06-14 DIAGNOSIS — M7061 Trochanteric bursitis, right hip: Secondary | ICD-10-CM

## 2024-06-14 DIAGNOSIS — M62838 Other muscle spasm: Secondary | ICD-10-CM

## 2024-06-14 DIAGNOSIS — N3010 Interstitial cystitis (chronic) without hematuria: Secondary | ICD-10-CM

## 2024-06-14 DIAGNOSIS — Z8639 Personal history of other endocrine, nutritional and metabolic disease: Secondary | ICD-10-CM

## 2024-06-14 DIAGNOSIS — Z96651 Presence of right artificial knee joint: Secondary | ICD-10-CM

## 2024-06-14 DIAGNOSIS — M19011 Primary osteoarthritis, right shoulder: Secondary | ICD-10-CM

## 2024-06-14 DIAGNOSIS — R5383 Other fatigue: Secondary | ICD-10-CM

## 2024-06-14 DIAGNOSIS — M47816 Spondylosis without myelopathy or radiculopathy, lumbar region: Secondary | ICD-10-CM

## 2024-06-14 DIAGNOSIS — G8929 Other chronic pain: Secondary | ICD-10-CM

## 2024-06-14 NOTE — Progress Notes (Signed)
 Office Visit Note  Patient: [name]             Date of Birth: November 20, 1945           MRN: [id number]             PCP: Loring Tanda Mae, MD Referring: Loring Tanda Mae, * Visit Date: 06/26/2024 Occupation: Data Unavailable  Subjective:  Pain in multiple joints  History of Present Illness: [name] is a 78 y.o. female with polymyalgia rheumatica and osteoarthritis.  She returns today after her last visit in March 2025.  She had left glenohumeral joint injection at the last visit which helped for a while.  She states the pain is gradually coming back.  She continues to have some generalized pain and stiffness.  She does not have much discomfort in her hands.  She states she slipped and fell on her right knee.  She had right knee joint aspiration x 2 by Dr. Hiram.  She continues to have discomfort over the right trochanteric bursa.  Lower back continues to hurt as well.    Activities of Daily Living:  Patient reports morning stiffness for all day.  Patient Reports nocturnal pain.  Difficulty dressing/grooming: Reports Difficulty climbing stairs: Reports Difficulty getting out of chair: Reports Difficulty using hands for taps, buttons, cutlery, and/or writing: Denies  Review of Systems  Constitutional:  Negative for fatigue.  HENT:  Negative for mouth sores and mouth dryness.   Eyes:  Positive for dryness.  Respiratory:  Negative for shortness of breath.   Cardiovascular:  Negative for chest pain and palpitations.  Gastrointestinal:  Negative for blood in stool, constipation and diarrhea.  Endocrine: Negative for increased urination.  Genitourinary:  Negative for involuntary urination.  Musculoskeletal:  Positive for joint pain, gait problem, joint pain, joint swelling, myalgias, muscle weakness, morning stiffness, muscle tenderness and myalgias.  Skin:  Positive for hair loss. Negative for color change, rash and sensitivity to sunlight.   Allergic/Immunologic: Negative for susceptible to infections.  Neurological:  Negative for dizziness and headaches.  Hematological:  Negative for swollen glands.  Psychiatric/Behavioral:  Positive for sleep disturbance. Negative for depressed mood. The patient is not nervous/anxious.     PMFS History:  Patient Active Problem List   Diagnosis Date Noted   Incomplete emptying of bladder 03/26/2024   Bladder ulcer 02/15/2024   Loud snoring 11/10/2023   Heart palpitations 11/10/2023   Diaphoresis 11/10/2023   Sleep related leg cramps 11/10/2023   Nonrheumatic aortic valve stenosis 11/10/2023   Fitting and adjustment of pessary 10/12/2023   Microscopic hematuria 10/12/2023   History of recurrent UTI (urinary tract infection) 09/01/2023   History of pelvic surgery 09/01/2023   Urinary incontinence, mixed 09/01/2023   Dysuria 09/01/2023   Nocturia 09/01/2023   Vaginal atrophy 09/01/2023   Failed total knee arthroplasty 05/12/2022   Failed total right knee replacement 05/12/2022   Precordial chest pain 12/31/2021   Primary localized osteoarthritis of right knee 10/19/2018   Right foot pain 10/19/2018   Incontinence of feces 09/21/2018   Vulvar lesion 07/20/2018   History of repair of right rotator cuff 03/03/2017   Primary osteoarthritis of both hands/ has had bilateral CMC surgery  03/03/2017   History of hypertension 03/03/2017   History of hyperlipidemia 03/03/2017   DDD (degenerative disc disease), lumbar/ history of lumbar surgery  03/03/2017   Osteopenia of multiple sites 03/03/2017   Chronic left shoulder pain 01/20/2017   Primary osteoarthritis of both knees  01/20/2017   Trapezius muscle spasm 01/20/2017   Insomnia 01/20/2017   Fatigue 01/20/2017   Fibromyalgia 01/20/2017   High risk medications (not anticoagulants) long-term use 01/20/2017   PMR (polymyalgia rheumatica) 01/20/2017   Left ovarian cyst 06/02/2015   Degenerative joint disease, shoulder, right 08/02/2013    Precordial pain 02/11/2012   HTN (hypertension) 02/11/2012   Hyperlipidemia 02/11/2012    Past Medical History:  Diagnosis Date   Bilateral ovarian cysts 09/12/2014   bilateral simple ovarian cysts.  US  due in January 2018.   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 x 20 yr.   Hyperlipidemia    Hypothyroid    IBS (irritable bowel syndrome)    Impingement syndrome of right shoulder 07/2013   Osteoporosis    Polymyalgia rheumatica    5-28  states she has been on prednisone  and now is trying to only be on plaquenil  to manage the polymyalgia    PONV (postoperative nausea and vomiting)    Primary localized osteoarthritis of right knee    Rupture of right rotator cuff 07/2013   VIN I (vulvar intraepithelial neoplasia I) 2017, 2018   this is from a perineal/vulvar biopsy.   Vitamin B12 deficiency     Family History  Problem Relation Age of Onset   Coronary artery disease Mother 44   Hypertension Mother    Aortic aneurysm Father    Hypertension Father    Prostate cancer Father    Other Brother        sepsis   Hyperlipidemia Brother        brain and kidney tumor, no treatment needed    Aneurysm Brother    Bladder Cancer Paternal Grandmother    Healthy Daughter    Healthy Daughter    Healthy Daughter    Uterine cancer Neg Hx    Past Surgical History:  Procedure Laterality Date   ANTERIOR LAT LUMBAR FUSION  10/08/2008   L3-4   BACK SURGERY     BACK SURGERY  10/03/2014   --Dr. Oneil Carwin at [facility]   CATARACT EXTRACTION, BILATERAL     03/2023, 04/2023   COLONOSCOPY W/ BIOPSIES  11/2009   polyps   COLONOSCOPY W/ BIOPSIES  11/2012   polyp, recheck in 3 years   CYSTO  07/05/2006   CYSTOCELE REPAIR  07/05/2006   CYSTOCELE REPAIR N/A 02/15/2024   Procedure: COLPORRHAPHY, ANTERIOR, FOR CYSTOCELE REPAIR;  Surgeon: Guadlupe Lianne DASEN, MD;  Location: [facility] OR;  Service: Gynecology;   Laterality: N/A;   CYSTOSCOPY WITH FULGERATION  02/15/2024   Procedure: CYSTOSCOPY, WITH BLADDER BIOPSY AND FULGURATION;  Surgeon: Guadlupe Lianne DASEN, MD;  Location: [facility] OR;  Service: Gynecology;;   ESOPHAGOGASTRODUODENOSCOPY ENDOSCOPY  05/25/2005   erythema and edema of gastric lumen   EXAM UNDER ANESTHESIA, PELVIC  02/15/2024   Procedure: EXAM UNDER ANESTHESIA, PELVIC;  Surgeon: Guadlupe Lianne DASEN, MD;  Location: [facility] OR;  Service: Gynecology;;   EYE SURGERY Right    muscle adjustment    FOOT SURGERY Right    GANGLION CYST EXCISION Right    wrist   INCONTINENCE SURGERY  07/05/2006   cystourethropexy   ORIF FOREARM FRACTURE Right 09/2012   RECTOCELE REPAIR N/A 02/15/2024   Procedure: COLPORRHAPHY, POSTERIOR, FOR RECTOCELE REPAIR;  Surgeon: Guadlupe Lianne DASEN, MD;  Location: [facility] OR;  Service: Gynecology;  Laterality: N/A;   SHOULDER ARTHROSCOPY WITH ROTATOR CUFF  REPAIR AND SUBACROMIAL DECOMPRESSION Right 08/02/2013   Procedure: SHOULDER ARTHROSCOPY WITH ROTATOR CUFF REPAIR AND SUBACROMIAL DECOMPRESSION, EXTENSIVE DEBRIDEMENT (INCLUDING LABRUM), PARTIAL ACROMIPLASY WITH CORACOACROMIAL RELEASE, DISTAL CLAVICULECTOMY;  Surgeon: Toribio JULIANNA Chancy, MD;  Location: [facility];  Service: Orthopedics;  Laterality: Right;   STRABISMUS SURGERY Right 01/26/2008   2 other surgeries previously   STRABISMUS SURGERY Right 06/03/2017   Procedure: REPAIR STRABISMUS RIGHT EYE;  Surgeon: Neysa Fallow, MD;  Location: [facility];  Service: Ophthalmology;  Laterality: Right;   Thumb surgery  2/13 & ?2010   Bilateral - states a joint was replaced   TOTAL KNEE ARTHROPLASTY Right 02/12/2019   Procedure: TOTAL KNEE ARTHROPLASTY;  Surgeon: Jane Charleston, MD;  Location: [facility];  Service: Orthopedics;  Laterality: Right;   TOTAL KNEE REVISION Right 05/12/2022   Procedure: Right knee polyethylene versus total knee arthroplasty revision;  Surgeon: Melodi Lerner, MD;  Location: [facility];  Service: Orthopedics;   Laterality: Right;   TOTAL VAGINAL HYSTERECTOMY  39 - or age 78   secondary AUB   TUBAL LIGATION     laparosopic BTL age 70   varicose veins Bilateral    Social History   Tobacco Use   Smoking status: Never    Passive exposure: Past   Smokeless tobacco: Never  Vaping Use   Vaping status: Never Used  Substance Use Topics   Alcohol use: No    Alcohol/week: 0.0 standard drinks of alcohol   Drug use: No   Social History   Social History Narrative   Lives at home with husband . One story home. Right handed     Immunization History  Administered Date(s) Administered   Influenza-Unspecified 08/04/2017   PFIZER(Purple Top)SARS-COV-2 Vaccination 11/15/2019, 12/12/2019, 06/16/2020   Pneumococcal Conjugate-13 05/14/2014, 05/15/2015   Tdap 09/13/2012, 10/19/2022     Objective: Vital Signs: BP 114/78   Pulse 83   Temp 97.7 F (36.5 C)   Resp 13   Ht 5' 8 (1.727 m)   Wt 188 lb 12.8 oz (85.6 kg)   LMP 09/13/1984 (Approximate)   BMI 28.71 kg/m    Physical Exam Vitals and nursing note reviewed.  Constitutional:      Appearance: She is well-developed.  HENT:     Head: Normocephalic and atraumatic.  Eyes:     Conjunctiva/sclera: Conjunctivae normal.  Cardiovascular:     Rate and Rhythm: Normal rate and regular rhythm.     Heart sounds: Normal heart sounds.  Pulmonary:     Effort: Pulmonary effort is normal.     Breath sounds: Normal breath sounds.  Abdominal:     General: Bowel sounds are normal.     Palpations: Abdomen is soft.  Musculoskeletal:     Cervical back: Normal range of motion.  Lymphadenopathy:     Cervical: No cervical adenopathy.  Skin:    General: Skin is warm and dry.     Capillary Refill: Capillary refill takes less than 2 seconds.  Neurological:     Mental Status: She is alert and oriented to person, place, and time.  Psychiatric:        Behavior: Behavior normal.      Musculoskeletal Exam: She had good range of motion of the cervical  spine.  Thoracic kyphosis was noted.  There was no tenderness over lumbar spine.  She had discomfort with range of motion of bilateral shoulders with limited abduction of left shoulder joint about 240 degrees and limited internal rotation.  Elbow joints, wrist  joints were in good range of motion.  She had bilateral CMC PIP and DIP thickening with mild synovitis.  Hip joints were in good range of motion.  Right knee joint was replaced in had some warmth on palpation.  Left knee joint was in good range of motion.  There was no tenderness over ankles or MTPs.  CDAI Exam: CDAI Score: -- Patient Global: --; Provider Global: -- Swollen: --; Tender: -- Joint Exam 06/26/2024   No joint exam has been documented for this visit   There is currently no information documented on the homunculus. Go to the Rheumatology activity and complete the homunculus joint exam.  Investigation: No additional findings.  Imaging: No results found.   Recent Labs: Lab Results  Component Value Date   WBC 13.8 (H) 05/13/2022   HGB 10.9 (L) 05/13/2022   PLT 403 (H) 05/13/2022   NA 141 02/15/2024   K 3.9 02/15/2024   CL 109 02/15/2024   CO2 22 02/15/2024   GLUCOSE 94 02/15/2024   BUN 15 02/15/2024   CREATININE 0.55 02/15/2024   BILITOT 0.2 06/30/2021   ALKPHOS 73 02/08/2019   AST 20 06/30/2021   ALT 15 06/30/2021   PROT 7.1 06/30/2021   ALBUMIN 4.1 02/08/2019   CALCIUM 8.6 (L) 02/15/2024   GFRAA 103 10/27/2020    Speciality Comments: PLQ Eye Exam: 04/07/2020 WNL @ [facility] Follow up in 1 year  Procedures:  Large Joint Inj: L glenohumeral on 06/26/2024 3:55 PM Indications: pain Details: 27 G 1.5 in needle, posterior approach  Arthrogram: No  Medications: 1.5 mL lidocaine  1 %; 40 mg triamcinolone  acetonide 40 MG/ML Aspirate: 0 mL Outcome: tolerated well, no immediate complications  Risk of infection, tendon injury, nerve injury, dermal atrophy and hypopigmentation were discussed. Procedure,  treatment alternatives, risks and benefits explained, specific risks discussed. Consent was given by the patient. Immediately prior to procedure a time out was called to verify the correct patient, procedure, equipment, support staff and site/side marked as required. Patient was prepped and draped in the usual sterile fashion.     Allergies: Penicillins and Sulfa antibiotics   Assessment / Plan:     Visit Diagnoses: PMR (polymyalgia rheumatica) - PLQ d/c Feb 2021 due to not noticing any clinical benefit.  No muscular weakness or tenderness was noted.  She denies having a flare of polymyalgia rheumatica.  Primary osteoarthritis of right shoulder - she has surgery on her right shoulder in the past.  She had some discomfort range of motion.  History of repair of right rotator cuff  Chronic left shoulder pain -she had good response to left shoulder joint injection given in March 2025.  She states the pain has returned.  X-rays of the left shoulder unremarkable on 06/30/2021.  Today after informed consent was obtained per patient's request left shoulder joint was injected with lidocaine  and Kenalog  as described above.  She tolerated the procedure well.  She was encouraged to continue to do exercises.  I also offered physical therapy which she declined.  Primary osteoarthritis of both hands/ has had bilateral CMC surgery -she continues to have some discomfort over her CMC joints.  S/P TKR (total knee replacement), right - Dr. Regino states that she had a fall after that she was evaluated by Dr. Hiram.  She had right knee joint aspiration x 2.  The right knee joint is feeling better.  Trochanteric bursitis, right hip-he continues to have intermittent discomfort in the trochanteric region.  Primary osteoarthritis of  left knee-she complains of pain and stiffness.  Spondylosis of lumbar spine - S/p surgery in the past.  She continues to have some lower back discomfort.  She states she goes to  Dr. Ibazebo for lumbar spine injections.  Chronic SI joint pain-she has intermittent discomfort in her lumbar spine.  Trapezius muscle spasm-she had good range of motion of the cervical spine.  Stretching exercises were discussed.  Fibromyalgia-she continues to have generalized pain and stiffness from fibromyalgia.  Need for regular exercise was discussed.  Other insomnia-sleep hygiene was discussed.  Other fatigue-related to fibromyalgia and insomnia.  Interstitial cystitis - She has been followed closely by urologist.  Osteopenia of multiple sites - DEXA updated December 2023-records not in Epic to review. Previous DEXA updated on 08/27/2020: Patient remains in the osteopenia range.she is followed by Dr. Tommas  History of hyperlipidemia  History of hypertension-blood pressure was normal today at 114/78.  Orders: Orders Placed This Encounter  Procedures   Large Joint Inj: L glenohumeral   No orders of the defined types were placed in this encounter.    Follow-Up Instructions: Return in about 6 months (around 12/25/2024) for Osteoarthritis.   Maya Nash, MD  Note - This record has been created using Animal nutritionist.  Chart creation errors have been sought, but may not always  have been located. Such creation errors do not reflect on  the standard of medical care.

## 2024-06-26 ENCOUNTER — Ambulatory Visit: Attending: Rheumatology | Admitting: Rheumatology

## 2024-06-26 ENCOUNTER — Encounter: Payer: Self-pay | Admitting: Rheumatology

## 2024-06-26 VITALS — BP 114/78 | HR 83 | Temp 97.7°F | Resp 13 | Ht 68.0 in | Wt 188.8 lb

## 2024-06-26 DIAGNOSIS — Z8639 Personal history of other endocrine, nutritional and metabolic disease: Secondary | ICD-10-CM

## 2024-06-26 DIAGNOSIS — M25512 Pain in left shoulder: Secondary | ICD-10-CM

## 2024-06-26 DIAGNOSIS — G8929 Other chronic pain: Secondary | ICD-10-CM | POA: Diagnosis not present

## 2024-06-26 DIAGNOSIS — M7061 Trochanteric bursitis, right hip: Secondary | ICD-10-CM

## 2024-06-26 DIAGNOSIS — N3010 Interstitial cystitis (chronic) without hematuria: Secondary | ICD-10-CM

## 2024-06-26 DIAGNOSIS — M1712 Unilateral primary osteoarthritis, left knee: Secondary | ICD-10-CM

## 2024-06-26 DIAGNOSIS — G4709 Other insomnia: Secondary | ICD-10-CM

## 2024-06-26 DIAGNOSIS — Z96651 Presence of right artificial knee joint: Secondary | ICD-10-CM

## 2024-06-26 DIAGNOSIS — Z9889 Other specified postprocedural states: Secondary | ICD-10-CM | POA: Diagnosis not present

## 2024-06-26 DIAGNOSIS — M533 Sacrococcygeal disorders, not elsewhere classified: Secondary | ICD-10-CM

## 2024-06-26 DIAGNOSIS — M353 Polymyalgia rheumatica: Secondary | ICD-10-CM | POA: Diagnosis not present

## 2024-06-26 DIAGNOSIS — R5383 Other fatigue: Secondary | ICD-10-CM

## 2024-06-26 DIAGNOSIS — M62838 Other muscle spasm: Secondary | ICD-10-CM

## 2024-06-26 DIAGNOSIS — M19042 Primary osteoarthritis, left hand: Secondary | ICD-10-CM

## 2024-06-26 DIAGNOSIS — M19041 Primary osteoarthritis, right hand: Secondary | ICD-10-CM

## 2024-06-26 DIAGNOSIS — M47816 Spondylosis without myelopathy or radiculopathy, lumbar region: Secondary | ICD-10-CM

## 2024-06-26 DIAGNOSIS — M19011 Primary osteoarthritis, right shoulder: Secondary | ICD-10-CM | POA: Diagnosis not present

## 2024-06-26 DIAGNOSIS — M797 Fibromyalgia: Secondary | ICD-10-CM

## 2024-06-26 DIAGNOSIS — Z8679 Personal history of other diseases of the circulatory system: Secondary | ICD-10-CM

## 2024-06-26 DIAGNOSIS — M8589 Other specified disorders of bone density and structure, multiple sites: Secondary | ICD-10-CM

## 2024-06-26 MED ORDER — LIDOCAINE HCL 1 % IJ SOLN
1.5000 mL | INTRAMUSCULAR | Status: AC | PRN
  Administered 2024-06-26: 1.5 mL

## 2024-06-26 MED ORDER — TRIAMCINOLONE ACETONIDE 40 MG/ML IJ SUSP
40.0000 mg | INTRAMUSCULAR | Status: AC | PRN
  Administered 2024-06-26: 40 mg via INTRA_ARTICULAR

## 2024-07-31 ENCOUNTER — Ambulatory Visit: Admitting: Obstetrics

## 2024-09-03 ENCOUNTER — Ambulatory Visit: Admitting: Obstetrics

## 2024-09-03 ENCOUNTER — Encounter: Payer: Self-pay | Admitting: Obstetrics

## 2024-09-03 ENCOUNTER — Other Ambulatory Visit (HOSPITAL_COMMUNITY)
Admission: RE | Admit: 2024-09-03 | Discharge: 2024-09-03 | Disposition: A | Discharge status: Home / Self Care | Source: Ambulatory Visit | Attending: Obstetrics | Admitting: Obstetrics

## 2024-09-03 ENCOUNTER — Ambulatory Visit: Payer: Self-pay | Admitting: Obstetrics

## 2024-09-03 VITALS — BP 122/80 | HR 92

## 2024-09-03 DIAGNOSIS — N390 Urinary tract infection, site not specified: Secondary | ICD-10-CM | POA: Insufficient documentation

## 2024-09-03 DIAGNOSIS — N3946 Mixed incontinence: Secondary | ICD-10-CM | POA: Diagnosis not present

## 2024-09-03 DIAGNOSIS — Z87440 Personal history of urinary (tract) infections: Secondary | ICD-10-CM

## 2024-09-03 DIAGNOSIS — N952 Postmenopausal atrophic vaginitis: Secondary | ICD-10-CM | POA: Diagnosis not present

## 2024-09-03 DIAGNOSIS — R10A2 Flank pain, left side: Secondary | ICD-10-CM

## 2024-09-03 LAB — POCT URINALYSIS DIP (CLINITEK)
Bilirubin, UA: NEGATIVE
Glucose, UA: NEGATIVE mg/dL
Ketones, POC UA: NEGATIVE mg/dL
Nitrite, UA: NEGATIVE
POC PROTEIN,UA: 100 — AB
Spec Grav, UA: 1.015
Urobilinogen, UA: 0.2 U/dL
pH, UA: 6

## 2024-09-03 LAB — URINALYSIS, COMPLETE (UACMP) WITH MICROSCOPIC
Bilirubin Urine: NEGATIVE
Glucose, UA: NEGATIVE mg/dL
Ketones, ur: NEGATIVE mg/dL
Nitrite: NEGATIVE
Protein, ur: 30 mg/dL — AB
RBC / HPF: NONE SEEN RBC/hpf (ref 0–5)
Specific Gravity, Urine: 1.01 (ref 1.005–1.030)
pH: 6 (ref 5.0–8.0)

## 2024-09-03 MED ORDER — CIPROFLOXACIN HCL 500 MG PO TABS: 500.0000 mg | ORAL_TABLET | Freq: Two times a day (BID) | ORAL | 0 refills | Status: AC

## 2024-09-03 MED ORDER — CEFTRIAXONE SODIUM 1 G IJ SOLR
1.0000 g | Freq: Once | INTRAMUSCULAR | Status: AC
  Administered 2024-09-03: 1 g via INTRAMUSCULAR

## 2024-09-03 MED ORDER — TROSPIUM CHLORIDE 20 MG PO TABS: 20.0000 mg | ORAL_TABLET | Freq: Two times a day (BID) | ORAL | 2 refills | Status: AC

## 2024-09-03 MED ORDER — METHENAMINE HIPPURATE 1 G PO TABS: 1.0000 g | ORAL_TABLET | Freq: Two times a day (BID) | ORAL | 2 refills | Status: DC

## 2024-09-03 MED ORDER — PHENAZOPYRIDINE HCL 200 MG PO TABS: 200.0000 mg | ORAL_TABLET | Freq: Three times a day (TID) | ORAL | 0 refills | Status: AC | PRN

## 2024-09-03 NOTE — Progress Notes (Deleted)
 Reports cloudy urine started last week, worsened dark yellow urine since 3 days ago.  Reports chills and shaking, left sided back pain Took Azo with relief and Tylenol  1000mg  x 2 yesterday.  Denies N/V but reports reduced appetite Continue vaginal estrogen 2x/week SUI 11x/wk down from 3x/day, 1x/day UUI 2-3x/day previously, worsened last week to 2-3x/week when she was on vacation  Bowel leakage and daytime urinary frequency resolved since surgery Denies UTI since last visit 04/30/24 Unclear if she started Trospium 

## 2024-09-03 NOTE — Assessment & Plan Note (Signed)
-   For symptomatic vaginal atrophy options include lubrication with a water -based lubricant, personal hygiene measures and barrier protection against wetness, and estrogen replacement in the form of vaginal cream, vaginal tablets, or a time-released vaginal ring.   - continue low dose vaginal estrogen 2-3x/week

## 2024-09-03 NOTE — Assessment & Plan Note (Addendum)
-   previously catheterized for , 70mL, and 50mL - continue ow dose vaginal estrogen 1g 2x/week - consider antibiotic suppression if symptoms improve after macrobid  until 3 months of vaginal estrogen - 11/09/23 POCT + leuk/protein/heme s/p Keflex  500mg  QID for presumed complicated UTI. UA + heme and culture with > 100K pansensitive E. coli - For treatment of recurrent urinary tract infections, we discussed management of recurrent UTIs including prophylaxis with a daily low dose antibiotic, transvaginal estrogen therapy, D-mannose, and cranberry supplements.  We discussed the role of diagnostic testing such as cystoscopy and upper tract imaging.  - encouraged probiotics - Rx methenamine  to start after completion of Ciprofloxacin  for UTI suppression

## 2024-09-03 NOTE — Assessment & Plan Note (Addendum)
-   POCT + leuk/protein/heme pending urine culture - Rx rocephin  IM x 1 due to concerns of pyelonephritis, Rx ciprofloxacin  to continue - Rx pyridinium 200mg  TID PRN comfort - pt to calll if she continues to experience persistent or worsening urinary symptoms such as fever > 100.4, nausea/vomiting, one sided back pain or blood in urine.  - start methenamine  for UTI suppression after completion of Cipro

## 2024-09-03 NOTE — Assessment & Plan Note (Signed)
-   baseline stress > urgency in the absence of UTI, now urgency more bothersome due to large volume leakage. Decreased in frequency since surgery - history of Sparc Midurethral sling by Dr. 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 Prior PVR 50mL  - For treatment of stress urinary incontinence,  non-surgical options include expectant management, weight loss, physical therapy, as well as a pessary.  Surgical options include a midurethral sling, Burch urethropexy, and transurethral injection of a bulking agent. - continue low dose vaginal estrogen, declines additional treatments for SUI at this time due to improvement of symptoms. - We discussed the symptoms of overactive bladder (OAB), which include urinary urgency, urinary frequency, nocturia, with or without urge incontinence.  While we do not know the exact etiology of OAB, several treatment options exist. We discussed management including behavioral therapy (decreasing bladder irritants, urge suppression strategies, timed voids, bladder retraining), physical therapy, medication; for refractory cases posterior tibial nerve stimulation, sacral neuromodulation, and intravesical botulinum toxin injection.  For anticholinergic medications, we discussed the potential side effects of anticholinergics including dry eyes, dry mouth, constipation, cognitive impairment and urinary retention. For Beta-3 agonist medication, we discussed the potential side effect of elevated blood pressure which is more likely to occur in individuals with uncontrolled hypertension. - symptoms previously improved after Keflex  for presumed UTI and vaginal estrogen - prior improvement attributed to Amlodipine  discontinuation.  - 10/05/23: urodynamics preop showed urinary retention with SUI, valsalva voiding and dyssynergic EMG.  - s/p PFPT referral  - resent Rx Trospium   20mg  BID to reassess cost . [facility]ed [facility] if cost prohibitive

## 2024-09-03 NOTE — Patient Instructions (Addendum)
 Start Ciprofloxacin  500mg  twice daily for 5-7 days.  Please call if you continue to experience persistent or worsening urinary symptoms such as fever > 100.4, nausea/vomiting, one sided back pain or blood in your urine.   Continue vaginal estrogen 1g twice a week.   Resume Trospium  20mg  twice daily urinary leakage.

## 2024-09-03 NOTE — Progress Notes (Signed)
 [facility] Return Visit  SUBJECTIVE  History of Present Illness: [name] is a 78 y.o. female seen in follow-up for mixed urinary incontinence, history of rUTI and midurethral sling, fecal incontinence, nocturia, left ovarian cyst, and vaginal atrophy. Plan at last visit was trial of trospium  and continue low dose vaginal estrogen.    Surgery: s/p Exam under anesthesia, anterior posterior repair, perineorrhaphy, cystoscopy with bladder biopsy and fulguration of bladder lesions on 02/15/24   Reports cloudy urine started last week, worsened dark yellow urine since 3 days ago.  Reports chills and shaking, left sided back pain Took Azo with relief and Tylenol  1000mg  x 2 yesterday.  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   Past Surgical History: She  has a past surgical history that includes Ganglion cyst excision (Right); Thumb surgery (2/13 & ?2010); Incontinence surgery (07/05/2006); Cystocele repair (07/05/2006); Cysto (07/05/2006); Strabismus surgery (Right, 01/26/2008); Anterior lat lumbar fusion  (10/08/2008); Back surgery; Total vaginal hysterectomy (1986 - or age 40); Foot surgery (Right); ORIF forearm fracture (Right, 09/2012); Shoulder arthroscopy with rotator cuff repair and subacromial decompression (Right, 08/02/2013); Colonoscopy w/ biopsies (11/2009); Colonoscopy w/ biopsies (11/2012); Esophagogastroduodenoscopy endoscopy (05/25/2005); Back surgery (10/03/2014); varicose veins (Bilateral); Strabismus surgery (Right, 06/03/2017); Tubal ligation; Eye surgery (Right); Total knee arthroplasty (Right, 02/12/2019); Total knee revision (Right, 05/12/2022); Cataract extraction, bilateral; Cystocele repair (N/A, 02/15/2024); Rectocele repair (N/A, 02/15/2024); Exam under anesthesia, pelvic (02/15/2024); and Cystoscopy with fulgeration (02/15/2024).   Medications: She has a current medication list which includes the following prescription(s): amlodipine , benazepril, calcium carb-cholecalciferol, ciprofloxacin , cyanocobalamin, estradiol , ibandronate, levothyroxine , magnesium, fiber, [START ON 09/13/2024] methenamine , multivitamin, phenazopyridine , and trospium .   Allergies: Patient is allergic to penicillins and sulfa antibiotics.   Social History: Patient  reports that she has never smoked. She has been exposed to tobacco smoke. She has never used smokeless tobacco. She reports that she does not drink alcohol and does not use drugs.     OBJECTIVE     Physical Exam: Vitals:   09/03/24 1022  BP: 122/80  Pulse: 92   Physical Exam Constitutional:      General: She is not in acute distress.    Appearance: Normal appearance.  Cardiovascular:     Rate and Rhythm: Normal rate.  Pulmonary:     Effort: Pulmonary effort is normal. No respiratory distress.  Abdominal:     Tenderness: There is left CVA tenderness. There is no right CVA tenderness.  Neurological:     Mental Status: She is alert.  Vitals reviewed. Exam conducted with a chaperone present.    Lab Results  Component Value Date    COLORU yellow 09/03/2024   CLARITYU cloudy (A) 09/03/2024   GLUCOSEUR negative 09/03/2024   BILIRUBINUR NEGATIVE 09/03/2024   KETONESU Negative 11/24/2023   SPECGRAV 1.015 09/03/2024  RBCUR moderate (A) 09/03/2024   PHUR 6.0 09/03/2024   PROTEINUR 30 (A) 09/03/2024   UROBILINOGEN 0.2 09/03/2024   LEUKOCYTESUR LARGE (A) 09/03/2024        ASSESSMENT AND PLAN    [name] is a 78 y.o. with:  1. History of recurrent UTI (urinary tract infection)   2. Urinary incontinence, mixed   3. Vaginal atrophy   4. Left flank pain      History of recurrent UTI (urinary tract infection) Assessment & Plan: - previously catheterized for , 70mL, and 50mL - continue ow dose vaginal estrogen 1g 2x/week - consider antibiotic suppression if symptoms improve after macrobid  until 3 months of vaginal estrogen - 11/09/23 POCT + leuk/protein/heme s/p Keflex  500mg  QID for presumed complicated UTI. UA + heme and culture with > 100K pansensitive E. coli - For treatment of recurrent urinary tract infections, we discussed management of recurrent UTIs including prophylaxis with a daily low dose antibiotic, transvaginal estrogen therapy, D-mannose, and cranberry supplements.  We discussed the role of diagnostic testing such as cystoscopy and upper tract imaging.  - encouraged probiotics - Rx methenamine  to start after completion of Ciprofloxacin  for UTI suppression  Orders: -     Urine Culture; Future -     Phenazopyridine  HCl; Take 1 tablet (200 mg total) by mouth 3 (three) times daily as needed for pain.  Dispense: 10 tablet; Refill: 0 -     Ciprofloxacin  HCl; Take 1 tablet (500 mg total) by mouth 2 (two) times daily for 10 days.  Dispense: 20 tablet; Refill: 0 -     Urinalysis, Complete w Microscopic -     POCT URINALYSIS DIP (CLINITEK) -     cefTRIAXone  Sodium  Urinary incontinence, mixed Assessment & Plan: - baseline stress > urgency in the absence of UTI, now urgency more bothersome due to  large volume leakage. Decreased in frequency since surgery - history of Sparc Midurethral sling by Dr. 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 Prior PVR 50mL  - For treatment of stress urinary incontinence,  non-surgical options include expectant management, weight loss, physical therapy, as well as a pessary.  Surgical options include a midurethral sling, Burch urethropexy, and transurethral injection of a bulking agent. - continue low dose vaginal estrogen, declines additional treatments for SUI at this time due to improvement of symptoms. - We discussed the symptoms of overactive bladder (OAB), which include urinary urgency, urinary frequency, nocturia, with or without urge incontinence.  While we do not know the exact etiology of OAB, several treatment options exist. We discussed management including behavioral therapy (decreasing bladder irritants, urge suppression strategies, timed voids, bladder retraining), physical therapy, medication; for refractory cases posterior tibial nerve stimulation, sacral neuromodulation, and intravesical botulinum toxin injection.  For anticholinergic medications, we discussed the potential side effects of anticholinergics including dry eyes, dry mouth, constipation, cognitive impairment and urinary retention. For Beta-3 agonist medication, we discussed the potential side effect of elevated blood pressure which is more likely to occur in individuals with uncontrolled hypertension. - symptoms previously improved after Keflex  for presumed UTI and vaginal estrogen - prior improvement attributed to Amlodipine  discontinuation.  - 10/05/23: urodynamics preop showed urinary retention with SUI, valsalva voiding and dyssynergic EMG.  - s/p PFPT referral  - resent Rx Trospium  20mg  BID to reassess cost . [facility] if cost  prohibitive  Orders: -  Trospium  Chloride; Take 1 tablet (20 mg total) by mouth 2 (two) times daily.  Dispense: 60 tablet; Refill: 2  Vaginal atrophy Assessment & Plan: - For symptomatic vaginal atrophy options include lubrication with a water -based lubricant, personal hygiene measures and barrier protection against wetness, and estrogen replacement in the form of vaginal cream, vaginal tablets, or a time-released vaginal ring.   - continue low dose vaginal estrogen 2-3x/week   Left flank pain Assessment & Plan: - POCT + leuk/protein/heme pending urine culture - Rx rocephin  IM x 1 due to concerns of pyelonephritis, Rx ciprofloxacin  to continue - Rx pyridinium 200mg  TID PRN comfort - pt to calll if she continues to experience persistent or worsening urinary symptoms such as fever > 100.4, nausea/vomiting, one sided back pain or blood in urine.  - start methenamine  for UTI suppression after completion of Cipro     Other orders -     Methenamine  Hippurate; Take 1 tablet (1 g total) by mouth 2 (two) times daily with a meal. Start after completion of ciprofloxacin  for UTI suppression  Dispense: 30 tablet; Refill: 2   Time spent: I spent 22 minutes dedicated to the care of this patient on the date of this encounter to include pre-visit review of records, face-to-face time with the patient discussing mixed urinary incontinence, vaginal atrophy, history of recurrent UTI, left flank pain, and post visit documentation and ordering medication/ testing.    Lianne ONEIDA Gillis, MD

## 2024-09-05 LAB — URINE CULTURE: Culture: >=100000 — AB

## 2024-09-11 ENCOUNTER — Other Ambulatory Visit: Payer: Self-pay | Admitting: Obstetrics

## 2024-09-18 ENCOUNTER — Other Ambulatory Visit: Payer: Self-pay | Admitting: Obstetrics

## 2024-09-18 DIAGNOSIS — R300 Dysuria: Secondary | ICD-10-CM

## 2024-09-18 DIAGNOSIS — N3946 Mixed incontinence: Secondary | ICD-10-CM

## 2024-09-18 DIAGNOSIS — Z87440 Personal history of urinary (tract) infections: Secondary | ICD-10-CM

## 2024-09-18 DIAGNOSIS — N952 Postmenopausal atrophic vaginitis: Secondary | ICD-10-CM

## 2024-09-24 ENCOUNTER — Encounter: Payer: Self-pay | Admitting: *Deleted

## 2024-10-24 ENCOUNTER — Encounter: Payer: Medicare Other | Admitting: Obstetrics and Gynecology

## 2024-12-26 ENCOUNTER — Ambulatory Visit: Admitting: Rheumatology
